# Patient Record
Sex: Female | Born: 1985 | Race: Black or African American | Hispanic: No | Marital: Single | Smoking: Current every day smoker
Health system: Southern US, Community
[De-identification: ages and names within clinical notes are randomized; demographics above are authoritative.]

## PROBLEM LIST (undated history)

## (undated) ENCOUNTER — Inpatient Hospital Stay (HOSPITAL_COMMUNITY): Payer: Self-pay

## (undated) DIAGNOSIS — L309 Dermatitis, unspecified: Secondary | ICD-10-CM

## (undated) DIAGNOSIS — K8020 Calculus of gallbladder without cholecystitis without obstruction: Secondary | ICD-10-CM

## (undated) DIAGNOSIS — I10 Essential (primary) hypertension: Secondary | ICD-10-CM

## (undated) DIAGNOSIS — D649 Anemia, unspecified: Secondary | ICD-10-CM

## (undated) DIAGNOSIS — R7303 Prediabetes: Secondary | ICD-10-CM

## (undated) DIAGNOSIS — G43909 Migraine, unspecified, not intractable, without status migrainosus: Secondary | ICD-10-CM

## (undated) DIAGNOSIS — J189 Pneumonia, unspecified organism: Secondary | ICD-10-CM

## (undated) HISTORY — DX: Anemia, unspecified: D64.9

---
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
## 2007-09-01 ENCOUNTER — Emergency Department (HOSPITAL_COMMUNITY): Admission: EM | Admit: 2007-09-01 | Discharge: 2007-09-01 | Payer: Self-pay | Admitting: Emergency Medicine

## 2008-04-10 ENCOUNTER — Inpatient Hospital Stay (HOSPITAL_COMMUNITY): Admission: AD | Admit: 2008-04-10 | Discharge: 2008-04-10 | Payer: Self-pay | Admitting: Family Medicine

## 2008-04-17 ENCOUNTER — Inpatient Hospital Stay (HOSPITAL_COMMUNITY): Admission: AD | Admit: 2008-04-17 | Discharge: 2008-04-18 | Payer: Self-pay | Admitting: Obstetrics & Gynecology

## 2009-04-24 ENCOUNTER — Ambulatory Visit: Payer: Self-pay | Admitting: Obstetrics and Gynecology

## 2009-04-24 ENCOUNTER — Inpatient Hospital Stay (HOSPITAL_COMMUNITY): Admission: AD | Admit: 2009-04-24 | Discharge: 2009-04-24 | Payer: Self-pay | Admitting: Obstetrics & Gynecology

## 2009-11-04 ENCOUNTER — Ambulatory Visit: Payer: Self-pay | Admitting: Obstetrics and Gynecology

## 2009-11-04 ENCOUNTER — Inpatient Hospital Stay (HOSPITAL_COMMUNITY): Admission: AD | Admit: 2009-11-04 | Discharge: 2009-11-04 | Payer: Self-pay | Admitting: Obstetrics & Gynecology

## 2009-11-29 ENCOUNTER — Emergency Department (HOSPITAL_COMMUNITY): Admission: EM | Admit: 2009-11-29 | Discharge: 2009-11-29 | Payer: Self-pay | Admitting: Emergency Medicine

## 2010-01-28 ENCOUNTER — Emergency Department (HOSPITAL_COMMUNITY): Admission: EM | Admit: 2010-01-28 | Discharge: 2009-07-21 | Payer: Self-pay | Admitting: Emergency Medicine

## 2010-02-13 ENCOUNTER — Emergency Department (HOSPITAL_COMMUNITY)
Admission: EM | Admit: 2010-02-13 | Discharge: 2010-02-13 | Payer: Self-pay | Source: Home / Self Care | Admitting: Emergency Medicine

## 2010-04-05 ENCOUNTER — Emergency Department (HOSPITAL_COMMUNITY)
Admission: EM | Admit: 2010-04-05 | Discharge: 2010-04-05 | Disposition: A | Discharge status: Home / Self Care | Payer: Self-pay | Attending: Emergency Medicine | Admitting: Emergency Medicine

## 2010-04-05 ENCOUNTER — Emergency Department (HOSPITAL_COMMUNITY): Payer: Self-pay

## 2010-04-05 DIAGNOSIS — R112 Nausea with vomiting, unspecified: Secondary | ICD-10-CM | POA: Insufficient documentation

## 2010-04-05 DIAGNOSIS — I10 Essential (primary) hypertension: Secondary | ICD-10-CM | POA: Insufficient documentation

## 2010-04-05 DIAGNOSIS — R059 Cough, unspecified: Secondary | ICD-10-CM | POA: Insufficient documentation

## 2010-04-05 DIAGNOSIS — R05 Cough: Secondary | ICD-10-CM | POA: Insufficient documentation

## 2010-04-05 LAB — DIFFERENTIAL
Eosinophils Absolute: 0.3 10*3/uL (ref 0.0–0.7)
Eosinophils Relative: 5 % (ref 0–5)
Lymphs Abs: 2.8 10*3/uL (ref 0.7–4.0)
Monocytes Relative: 9 % (ref 3–12)
Neutrophils Relative %: 36 % — ABNORMAL LOW (ref 43–77)

## 2010-04-05 LAB — URINALYSIS, ROUTINE W REFLEX MICROSCOPIC
Bilirubin Urine: NEGATIVE
Ketones, ur: NEGATIVE mg/dL
Nitrite: NEGATIVE
Protein, ur: NEGATIVE mg/dL
Urobilinogen, UA: 0.2 mg/dL (ref 0.0–1.0)
pH: 8 (ref 5.0–8.0)

## 2010-04-05 LAB — CBC
HCT: 37.1 % (ref 36.0–46.0)
MCH: 29.7 pg (ref 26.0–34.0)
MCV: 86.9 fL (ref 78.0–100.0)
Platelets: 339 10*3/uL (ref 150–400)
RBC: 4.27 MIL/uL (ref 3.87–5.11)

## 2010-04-05 LAB — COMPREHENSIVE METABOLIC PANEL
Alkaline Phosphatase: 63 U/L (ref 39–117)
BUN: 5 mg/dL — ABNORMAL LOW (ref 6–23)
CO2: 27 mEq/L (ref 19–32)
GFR calc non Af Amer: >60 mL/min (ref >60)
Glucose, Bld: 97 mg/dL (ref 70–99)
Potassium: 4 mEq/L (ref 3.5–5.1)
Total Bilirubin: 0.2 mg/dL — ABNORMAL LOW (ref 0.3–1.2)
Total Protein: 7.9 g/dL (ref 6.0–8.3)

## 2010-04-05 LAB — LIPASE, BLOOD: Lipase: 20 U/L (ref 11–59)

## 2010-05-03 LAB — POCT URINALYSIS DIPSTICK
Protein, ur: NEGATIVE mg/dL
Urobilinogen, UA: 0.2 mg/dL (ref 0.0–1.0)
pH: 6 (ref 5.0–8.0)

## 2010-05-03 LAB — WET PREP, GENITAL

## 2010-05-03 LAB — POCT PREGNANCY, URINE: Preg Test, Ur: NEGATIVE

## 2010-05-06 LAB — URINALYSIS, ROUTINE W REFLEX MICROSCOPIC
Bilirubin Urine: NEGATIVE
Ketones, ur: NEGATIVE mg/dL
Nitrite: NEGATIVE
Specific Gravity, Urine: 1.02 (ref 1.005–1.030)
Urobilinogen, UA: 0.2 mg/dL (ref 0.0–1.0)
pH: 6.5 (ref 5.0–8.0)

## 2010-05-06 LAB — POCT RAPID STREP A (OFFICE): Streptococcus, Group A Screen (Direct): POSITIVE — AB

## 2010-05-06 LAB — WET PREP, GENITAL
Trich, Wet Prep: NONE SEEN
Yeast Wet Prep HPF POC: NONE SEEN

## 2010-05-06 LAB — GC/CHLAMYDIA PROBE AMP, GENITAL
Chlamydia, DNA Probe: NEGATIVE
GC Probe Amp, Genital: NEGATIVE

## 2010-05-10 LAB — URINALYSIS, ROUTINE W REFLEX MICROSCOPIC
Bilirubin Urine: NEGATIVE
Nitrite: NEGATIVE
Specific Gravity, Urine: 1.02 (ref 1.005–1.030)
Urobilinogen, UA: 0.2 mg/dL (ref 0.0–1.0)
pH: 6 (ref 5.0–8.0)

## 2010-05-10 LAB — POCT I-STAT, CHEM 8
Chloride: 103 mEq/L (ref 96–112)
Glucose, Bld: 131 mg/dL — ABNORMAL HIGH (ref 70–99)
HCT: 38 % (ref 36.0–46.0)
Hemoglobin: 12.9 g/dL (ref 12.0–15.0)
Potassium: 3.8 mEq/L (ref 3.5–5.1)
Sodium: 138 mEq/L (ref 135–145)

## 2010-05-10 LAB — POCT PREGNANCY, URINE: Preg Test, Ur: NEGATIVE

## 2010-05-16 LAB — URINALYSIS, ROUTINE W REFLEX MICROSCOPIC
Leukocytes, UA: NEGATIVE
Nitrite: NEGATIVE
Protein, ur: NEGATIVE mg/dL
Specific Gravity, Urine: 1.02 (ref 1.005–1.030)
Urobilinogen, UA: 0.2 mg/dL (ref 0.0–1.0)

## 2010-05-16 LAB — URINE MICROSCOPIC-ADD ON

## 2010-05-16 LAB — GC/CHLAMYDIA PROBE AMP, GENITAL: Chlamydia, DNA Probe: POSITIVE — AB

## 2010-06-08 LAB — POCT PREGNANCY, URINE: Preg Test, Ur: NEGATIVE

## 2010-06-08 LAB — CBC
HCT: 34.9 % — ABNORMAL LOW (ref 36.0–46.0)
MCHC: 32.9 g/dL (ref 30.0–36.0)
MCV: 89.8 fL (ref 78.0–100.0)
Platelets: 350 10*3/uL (ref 150–400)
RDW: 13.2 % (ref 11.5–15.5)

## 2010-06-08 LAB — URINALYSIS, ROUTINE W REFLEX MICROSCOPIC
Bilirubin Urine: NEGATIVE
Nitrite: NEGATIVE
Specific Gravity, Urine: 1.025 (ref 1.005–1.030)
Urobilinogen, UA: 0.2 mg/dL (ref 0.0–1.0)
pH: 6 (ref 5.0–8.0)

## 2010-06-08 LAB — DIFFERENTIAL
Basophils Absolute: 0 10*3/uL (ref 0.0–0.1)
Basophils Relative: 0 % (ref 0–1)
Eosinophils Absolute: 0.4 10*3/uL (ref 0.0–0.7)
Eosinophils Relative: 6 % — ABNORMAL HIGH (ref 0–5)
Lymphocytes Relative: 45 % (ref 12–46)
Monocytes Absolute: 0.8 10*3/uL (ref 0.1–1.0)

## 2010-06-08 LAB — GC/CHLAMYDIA PROBE AMP, GENITAL
Chlamydia, DNA Probe: NEGATIVE
GC Probe Amp, Genital: NEGATIVE

## 2010-06-08 LAB — WET PREP, GENITAL: Yeast Wet Prep HPF POC: NONE SEEN

## 2010-09-04 ENCOUNTER — Emergency Department (HOSPITAL_COMMUNITY)
Admission: EM | Admit: 2010-09-04 | Discharge: 2010-09-04 | Disposition: A | Discharge status: Home / Self Care | Payer: Self-pay | Attending: Emergency Medicine | Admitting: Emergency Medicine

## 2010-09-04 ENCOUNTER — Emergency Department (HOSPITAL_COMMUNITY): Payer: Self-pay

## 2010-09-04 DIAGNOSIS — N6459 Other signs and symptoms in breast: Secondary | ICD-10-CM | POA: Insufficient documentation

## 2010-09-04 DIAGNOSIS — I10 Essential (primary) hypertension: Secondary | ICD-10-CM | POA: Insufficient documentation

## 2010-09-04 DIAGNOSIS — R51 Headache: Secondary | ICD-10-CM | POA: Insufficient documentation

## 2010-09-04 LAB — URINALYSIS, ROUTINE W REFLEX MICROSCOPIC
Bilirubin Urine: NEGATIVE
Hgb urine dipstick: NEGATIVE
Ketones, ur: NEGATIVE mg/dL
Nitrite: NEGATIVE
Protein, ur: NEGATIVE mg/dL
Urobilinogen, UA: 0.2 mg/dL (ref 0.0–1.0)

## 2010-09-04 LAB — POCT PREGNANCY, URINE: Preg Test, Ur: NEGATIVE

## 2010-09-28 ENCOUNTER — Emergency Department (HOSPITAL_COMMUNITY)
Admission: EM | Admit: 2010-09-28 | Discharge: 2010-09-28 | Disposition: A | Discharge status: Home / Self Care | Payer: Self-pay | Attending: Emergency Medicine | Admitting: Emergency Medicine

## 2010-09-28 DIAGNOSIS — L259 Unspecified contact dermatitis, unspecified cause: Secondary | ICD-10-CM | POA: Insufficient documentation

## 2010-09-28 DIAGNOSIS — L299 Pruritus, unspecified: Secondary | ICD-10-CM | POA: Insufficient documentation

## 2010-09-28 DIAGNOSIS — R21 Rash and other nonspecific skin eruption: Secondary | ICD-10-CM | POA: Insufficient documentation

## 2010-09-28 DIAGNOSIS — I10 Essential (primary) hypertension: Secondary | ICD-10-CM | POA: Insufficient documentation

## 2010-10-02 ENCOUNTER — Inpatient Hospital Stay (INDEPENDENT_AMBULATORY_CARE_PROVIDER_SITE_OTHER)
Admission: RE | Admit: 2010-10-02 | Discharge: 2010-10-02 | Disposition: A | Discharge status: Home / Self Care | Payer: Self-pay | Source: Ambulatory Visit | Attending: Family Medicine | Admitting: Family Medicine

## 2010-10-02 DIAGNOSIS — B356 Tinea cruris: Secondary | ICD-10-CM

## 2010-10-02 DIAGNOSIS — A499 Bacterial infection, unspecified: Secondary | ICD-10-CM

## 2010-10-02 DIAGNOSIS — N760 Acute vaginitis: Secondary | ICD-10-CM

## 2010-10-02 LAB — POCT URINALYSIS DIP (DEVICE)
Hgb urine dipstick: NEGATIVE
Leukocytes, UA: NEGATIVE
Nitrite: NEGATIVE
Protein, ur: NEGATIVE mg/dL
pH: 7 (ref 5.0–8.0)

## 2010-10-02 LAB — WET PREP, GENITAL: Trich, Wet Prep: NONE SEEN

## 2010-10-06 LAB — GC/CHLAMYDIA PROBE AMP, GENITAL
Chlamydia, DNA Probe: NEGATIVE
GC Probe Amp, Genital: NEGATIVE

## 2011-04-16 ENCOUNTER — Encounter (HOSPITAL_COMMUNITY): Payer: Self-pay | Admitting: *Deleted

## 2011-04-16 ENCOUNTER — Emergency Department (INDEPENDENT_AMBULATORY_CARE_PROVIDER_SITE_OTHER)
Admission: EM | Admit: 2011-04-16 | Discharge: 2011-04-16 | Disposition: A | Discharge status: Home / Self Care | Payer: Self-pay | Source: Home / Self Care

## 2011-04-16 DIAGNOSIS — J988 Other specified respiratory disorders: Secondary | ICD-10-CM

## 2011-04-16 DIAGNOSIS — B9789 Other viral agents as the cause of diseases classified elsewhere: Secondary | ICD-10-CM

## 2011-04-16 HISTORY — DX: Essential (primary) hypertension: I10

## 2011-04-16 LAB — POCT RAPID STREP A: Streptococcus, Group A Screen (Direct): NEGATIVE

## 2011-04-16 MED ORDER — BENZONATATE 100 MG PO CAPS: 100.0000 mg | ORAL_CAPSULE | Freq: Three times a day (TID) | ORAL | Status: AC | PRN

## 2011-04-16 MED ORDER — ONDANSETRON HCL 4 MG PO TABS: 4.0000 mg | ORAL_TABLET | Freq: Three times a day (TID) | ORAL | Status: AC | PRN

## 2011-04-16 MED ORDER — ALBUTEROL SULFATE HFA 108 (90 BASE) MCG/ACT IN AERS: 2.0000 | INHALATION_SPRAY | RESPIRATORY_TRACT | Status: DC | PRN

## 2011-04-16 NOTE — Discharge Instructions (Signed)
Viral Infections A viral infection can be caused by different types of viruses.Most viral infections are not serious and resolve on their own. However, some infections may cause severe symptoms and may lead to further complications. SYMPTOMS Viruses can frequently cause:  Minor sore throat.   Aches and pains.   Headaches.   Runny nose.   Different types of rashes.   Watery eyes.   Tiredness.   Cough.   Loss of appetite.   Gastrointestinal infections, resulting in nausea, vomiting, and diarrhea.  These symptoms do not respond to antibiotics because the infection is not caused by bacteria. However, you might catch a bacterial infection following the viral infection. This is sometimes called a "superinfection." Symptoms of such a bacterial infection may include:  Worsening sore throat with pus and difficulty swallowing.   Swollen neck glands.   Chills and a high or persistent fever.   Severe headache.   Tenderness over the sinuses.   Persistent overall ill feeling (malaise), muscle aches, and tiredness (fatigue).   Persistent cough.   Yellow, green, or brown mucus production with coughing.  HOME CARE INSTRUCTIONS   Only take over-the-counter or prescription medicines for pain, discomfort, diarrhea, or fever as directed by your caregiver.   Drink enough water and fluids to keep your urine clear or pale yellow. Sports drinks can provide valuable electrolytes, sugars, and hydration.   Get plenty of rest and maintain proper nutrition. Soups and broths with crackers or rice are fine.  SEEK IMMEDIATE MEDICAL CARE IF:   You have severe headaches, shortness of breath, chest pain, neck pain, or an unusual rash.   You have uncontrolled vomiting, diarrhea, or you are unable to keep down fluids.   You or your child has an oral temperature above 102 F (38.9 C), not controlled by medicine.   Your baby is older than 3 months with a rectal temperature of 102 F (38.9 C) or  higher.   Your baby is 77 months old or younger with a rectal temperature of 100.4 F (38 C) or higher.  MAKE SURE YOU:   Understand these instructions.   Will watch your condition.   Will get help right away if you are not doing well or get worse.  Document Released: 11/17/2004 Document Revised: 10/20/2010 Document Reviewed: 06/14/2010 [facility] Patient Information 2012 [place], [place].Antibiotic Nonuse  Your caregiver felt that the infection or problem was not one that would be helped with an antibiotic. Infections may be caused by viruses or bacteria. Only a caregiver can tell which one of these is the likely cause of an illness. A cold is the most common cause of infection in both adults and children. A cold is a virus. Antibiotic treatment will have no effect on a viral infection. Viruses can lead to many lost days of work caring for sick children and many missed days of school. Children may catch as many as 10 "colds" or "flus" per year during which they can be tearful, cranky, and uncomfortable. The goal of treating a virus is aimed at keeping the ill person comfortable. Antibiotics are medications used to help the body fight bacterial infections. There are relatively few types of bacteria that cause infections but there are hundreds of viruses. While both viruses and bacteria cause infection they are very different types of germs. A viral infection will typically go away by itself within 7 to 10 days. Bacterial infections may spread or get worse without antibiotic treatment. Examples of bacterial infections are:  Sore throats (  like strep throat or tonsillitis).   Infection in the lung (pneumonia).   Ear and skin infections.  Examples of viral infections are:  Colds or flus.   Most coughs and bronchitis.   Sore throats not caused by Strep.   Runny noses.  It is often best not to take an antibiotic when a viral infection is the cause of the problem. Antibiotics can kill off the  helpful bacteria that we have inside our body and allow harmful bacteria to start growing. Antibiotics can cause side effects such as allergies, nausea, and diarrhea without helping to improve the symptoms of the viral infection. Additionally, repeated uses of antibiotics can cause bacteria inside of our body to become resistant. That resistance can be passed onto harmful bacterial. The next time you have an infection it may be harder to treat if antibiotics are used when they are not needed. Not treating with antibiotics allows our own immune system to develop and take care of infections more efficiently. Also, antibiotics will work better for [place] when they are prescribed for bacterial infections. Treatments for a child that is ill may include:  Give extra fluids throughout the day to stay hydrated.   Get plenty of rest.   Only give your child over-the-counter or prescription medicines for pain, discomfort, or fever as directed by your caregiver.   The use of a cool mist humidifier may help stuffy noses.   Cold medications if suggested by your caregiver.  Your caregiver may decide to start you on an antibiotic if:  The problem you were seen for today continues for a longer length of time than expected.   You develop a secondary bacterial infection.  SEEK MEDICAL CARE IF:  Fever lasts longer than 5 days.   Symptoms continue to get worse after 5 to 7 days or become severe.   Difficulty in breathing develops.   Signs of dehydration develop (poor drinking, rare urinating, dark colored urine).   Changes in behavior or worsening tiredness (listlessness or lethargy).  Document Released: 04/18/2001 Document Revised: 10/20/2010 Document Reviewed: 10/15/2008 [facility] Patient Information 2012 [place], [place].  RESOURCE GUIDE  Dental Problems  Patients with Medicaid: [facility] [phone]                                            [id number] [employer] Phone:  [phone]                                                  Phone:  [phone]  If unable to pay or uninsured, contact:  [facility] or [facility]. to become qualified for the adult dental clinic.  Chronic Pain Problems Contact [facility]  [phone] Patients need to be referred by their primary care doctor.  Insufficient Money for Medicine Contact [employer] Way:  call "211" or [facility] (930)068-7124.  No Primary Care Doctor Call [facility]  [phone] Other agencies that provide inexpensive medical care    [facility]  [phone]    [facility]  [phone]  [facility]  [phone]    [facility]  [phone]    [facility]  [phone]    [facility]  [phone]  Psychological Services [facility] Behavioral Health  [phone] [facility]  [phone] [facility]   [phone] (emergency services [phone])  Substance Abuse Resources [facility]  [phone] [facility] [phone] [phone] Floydene Flock [phone] Residential & Outpatient Substance Abuse Program  [phone]  Abuse/Neglect [facility] Child Abuse Hotline (352)143-6128 [facility] Child Abuse Hotline (817)203-1127 (After Hours)  Emergency Shelter [facility] [phone]  M[facility] [place] [facility] [phone] [facility] [phone]  MRSA Hotline #:   424-445-9574    [facility] Resources  [facility] [place]     [employer]                          [facility] Dept. [address] [place]                       [address]      [address]  Blondell Reveal Phone:  [phone]                                   Phone:   [phone]                 Phone:  [phone]  [facility] Phone:  [phone]  [facility] Child Abuse Hotline 901-297-3568 [phone] (After Hours)

## 2011-04-16 NOTE — ED Provider Notes (Signed)
Medical screening examination/treatment/procedure(s) were performed by non-physician practitioner and as supervising physician I was immediately available for consultation/collaboration.  Leslee Home, M.D.   Roque Lias, MD 04/16/11 2051

## 2011-04-16 NOTE — ED Notes (Signed)
Pt with onset of sorethroat cough n&v Wednesday night - last vomited yesterday - no vomiting today denies nausea - remains with sore throat pain upper abd and back with coughing

## 2011-04-16 NOTE — ED Provider Notes (Signed)
History     CSN: [id number]  Arrival date & time 04/16/11  1325   None     Chief Complaint  Patient presents with  . Sore Throat  . Cough    (Consider location/radiation/quality/duration/timing/severity/associated sxs/prior treatment) HPI Comments: Patient reports she started with sore throat, cough, nasal congestion 4 days ago, the next day began to have N/V.  Today the sore throat is more severe.  Has vomited approximately 5 times, states she is not keeping anything down.  Denies fevers, abdominal pain, diarrhea, SOB.  Cough is nonproductive.  Pt took over the counter cold/flu preparations without improvement.  Patient is a 26 y.o. female presenting with pharyngitis and cough. The history is provided by the patient.  Sore Throat Pertinent negatives include no abdominal pain and no shortness of breath.  Cough Pertinent negatives include no ear pain, no shortness of breath and no wheezing.    Past Medical History  Diagnosis Date  . Hypertension     History reviewed. No pertinent past surgical history.  History reviewed. No pertinent family history.  History  Substance Use Topics  . Smoking status: Never Smoker   . Smokeless tobacco: Not on file  . Alcohol Use: Yes    OB History    Grav Para Term Preterm Abortions TAB SAB Ect Mult Living                  Review of Systems  Constitutional: Negative for fever.  HENT: Negative for ear pain and trouble swallowing.   Respiratory: Positive for cough. Negative for shortness of breath, wheezing and stridor.   Gastrointestinal: Negative for abdominal pain, diarrhea and constipation.  All other systems reviewed and are negative.    Allergies  Review of patient's allergies indicates no known allergies.  Home Medications   Current Outpatient Rx  Name Route Sig Dispense Refill  . THERAFLU COLD & COUGH PO Oral Take by mouth.    . NYQUIL PO Oral Take by mouth.      BP 140/96  Pulse 98  Temp(Src) 98.9 F (37.2 C)  (Oral)  Resp 20  SpO2 96%  LMP 03/25/2011  Physical Exam  Nursing note and vitals reviewed. Constitutional: She is oriented to person, place, and time. She appears well-developed and well-nourished.  HENT:  Head: Normocephalic and atraumatic.  Right Ear: External ear normal.  Left Ear: External ear normal.  Mouth/Throat: Uvula is midline. Mucous membranes are not dry. No uvula swelling. Oropharyngeal exudate, posterior oropharyngeal edema and posterior oropharyngeal erythema present. No tonsillar abscesses.  Neck: Trachea normal, normal range of motion and phonation normal. Neck supple. No tracheal tenderness present. No rigidity. No tracheal deviation and normal range of motion present.  Cardiovascular: Normal rate and regular rhythm.   Pulmonary/Chest: Effort normal and breath sounds normal. No stridor. No respiratory distress. She has no wheezes. She has no rales. She exhibits no tenderness.  Neurological: She is alert and oriented to person, place, and time.  Psychiatric: She has a normal mood and affect. Her behavior is normal.    ED Course  Procedures (including critical care time)   Labs Reviewed  POCT RAPID STREP A ([facility] URG CARE ONLY)   No results found.   1. Viral respiratory illness       MDM  Nontoxic afebrile patient with 4 days of cough/cold symptoms and N/V.  Pt is well hydrated clinically.  Lungs CTAB.  Strep negative (Pt is afebrile, +cough, no cervical lymphadenopathy, does have some tonsillar enlargement  and small amount of exudate, therefore by [facility] criteria I have tested patient but do not believe she required empiric treatment).  Pt d/c home with symptomatic medications.          Rise Patience, [place] 04/16/11 1539

## 2011-04-26 ENCOUNTER — Encounter (HOSPITAL_COMMUNITY): Payer: Self-pay | Admitting: Emergency Medicine

## 2011-04-26 ENCOUNTER — Emergency Department (HOSPITAL_COMMUNITY)
Admission: EM | Admit: 2011-04-26 | Discharge: 2011-04-27 | Disposition: A | Discharge status: Home Health | Payer: Self-pay | Attending: Emergency Medicine | Admitting: Emergency Medicine

## 2011-04-26 DIAGNOSIS — R05 Cough: Secondary | ICD-10-CM | POA: Insufficient documentation

## 2011-04-26 DIAGNOSIS — R059 Cough, unspecified: Secondary | ICD-10-CM | POA: Insufficient documentation

## 2011-04-26 DIAGNOSIS — J40 Bronchitis, not specified as acute or chronic: Secondary | ICD-10-CM | POA: Insufficient documentation

## 2011-04-26 DIAGNOSIS — R51 Headache: Secondary | ICD-10-CM | POA: Insufficient documentation

## 2011-04-26 DIAGNOSIS — R1110 Vomiting, unspecified: Secondary | ICD-10-CM | POA: Insufficient documentation

## 2011-04-26 DIAGNOSIS — J029 Acute pharyngitis, unspecified: Secondary | ICD-10-CM | POA: Insufficient documentation

## 2011-04-26 DIAGNOSIS — I10 Essential (primary) hypertension: Secondary | ICD-10-CM | POA: Insufficient documentation

## 2011-04-26 NOTE — ED Notes (Signed)
PT. REPORTS PERSISTENT DRY COUGH FOR 1 WEEK WITH HEADACHE AND SORE THROAT.

## 2011-04-27 ENCOUNTER — Emergency Department (HOSPITAL_COMMUNITY): Payer: Self-pay

## 2011-04-27 MED ORDER — PREDNISONE (PAK) 10 MG PO TABS: ORAL_TABLET | ORAL | Status: AC

## 2011-04-27 MED ORDER — HYDROCOD POLST-CHLORPHEN POLST 10-8 MG/5ML PO LQCR: 5.0000 mL | Freq: Two times a day (BID) | ORAL | Status: DC | PRN

## 2011-04-27 MED ORDER — PREDNISONE 20 MG PO TABS
60.0000 mg | ORAL_TABLET | Freq: Once | ORAL | Status: AC
  Administered 2011-04-27: 60 mg via ORAL
  Filled 2011-04-27: qty 3

## 2011-04-27 MED ORDER — ALBUTEROL SULFATE HFA 108 (90 BASE) MCG/ACT IN AERS
2.0000 | INHALATION_SPRAY | Freq: Once | RESPIRATORY_TRACT | Status: AC
  Administered 2011-04-27: 2 via RESPIRATORY_TRACT
  Filled 2011-04-27: qty 6.7

## 2011-04-27 NOTE — ED Notes (Signed)
Pt stated that she has had a cough x 1 1/2 weeks. Cough is nonproductive. Pt states that she begins to cough until it makes her vomit. Pt went to the Urgent Care and they gave her cough medicine (pt does not remember the name) and the medication is not working. Lung sounds are clear. Pt has not had any SOB  Or CP. Will continue to monitor.

## 2011-04-27 NOTE — ED Provider Notes (Signed)
Medical screening examination/treatment/procedure(s) were performed by non-physician practitioner and as supervising physician I was immediately available for consultation/collaboration.   Ayodeji Keimig L Naziah Portee, MD 04/27/11 0517 

## 2011-04-27 NOTE — Discharge Instructions (Signed)
Bronchitis Bronchitis is the body's way of reacting to injury and/or infection (inflammation) of the bronchi. Bronchi are the air tubes that extend from the windpipe into the lungs. If the inflammation becomes severe, it may cause shortness of breath. CAUSES  Inflammation may be caused by:  A virus.   Germs (bacteria).   Dust.   Allergens.   Pollutants and many other irritants.  The cells lining the bronchial tree are covered with tiny hairs (cilia). These constantly beat upward, away from the lungs, toward the mouth. This keeps the lungs free of pollutants. When these cells become too irritated and are unable to do their job, mucus begins to develop. This causes the characteristic cough of bronchitis. The cough clears the lungs when the cilia are unable to do their job. Without either of these protective mechanisms, the mucus would settle in the lungs. Then you would develop pneumonia. Smoking is a common cause of bronchitis and can contribute to pneumonia. Stopping this habit is the single most important thing you can do to help yourself. TREATMENT   Your caregiver may prescribe an antibiotic if the cough is caused by bacteria. Also, medicines that open up your airways make it easier to breathe. Your caregiver may also recommend or prescribe an expectorant. It will loosen the mucus to be coughed up. Only take over-the-counter or prescription medicines for pain, discomfort, or fever as directed by your caregiver.   Removing whatever causes the problem (smoking, for example) is critical to preventing the problem from getting worse.   Cough suppressants may be prescribed for relief of cough symptoms.   Inhaled medicines may be prescribed to help with symptoms now and to help prevent problems from returning.   For those with recurrent (chronic) bronchitis, there may be a need for steroid medicines.  SEEK IMMEDIATE MEDICAL CARE IF:   During treatment, you develop more pus-like mucus  (purulent sputum).   You have a fever.   Your baby is older than 3 months with a rectal temperature of 102 F (38.9 C) or higher.   Your baby is 3 months old or younger with a rectal temperature of 100.4 F (38 C) or higher.   You become progressively more ill.   You have increased difficulty breathing, wheezing, or shortness of breath.  It is necessary to seek immediate medical care if you are elderly or sick from any other disease. MAKE SURE YOU:   Understand these instructions.   Will watch your condition.   Will get help right away if you are not doing well or get worse.  Document Released: 02/07/2005 Document Revised: 01/27/2011 Document Reviewed: 12/18/2007 [employer] Patient Information 2012 [employer].  Antibiotic Nonuse  Your caregiver felt that the infection or problem was not one that would be helped with an antibiotic. Infections may be caused by viruses or bacteria. Only a caregiver can tell which one of these is the likely cause of an illness. A cold is the most common cause of infection in both adults and children. A cold is a virus. Antibiotic treatment will have no effect on a viral infection. Viruses can lead to many lost days of work caring for sick children and many missed days of school. Children may catch as many as 10 "colds" or "flus" per year during which they can be tearful, cranky, and uncomfortable. The goal of treating a virus is aimed at keeping the ill person comfortable. Antibiotics are medications used to help the body fight bacterial infections. There are   relatively few types of bacteria that cause infections but there are hundreds of viruses. While both viruses and bacteria cause infection they are very different types of germs. A viral infection will typically go away by itself within 7 to 10 days. Bacterial infections may spread or get worse without antibiotic treatment. Examples of bacterial infections are:  Sore throats (like strep throat or  tonsillitis).   Infection in the lung (pneumonia).   Ear and skin infections.  Examples of viral infections are:  Colds or flus.   Most coughs and bronchitis.   Sore throats not caused by Strep.   Runny noses.  It is often best not to take an antibiotic when a viral infection is the cause of the problem. Antibiotics can kill off the helpful bacteria that we have inside our body and allow harmful bacteria to start growing. Antibiotics can cause side effects such as allergies, nausea, and diarrhea without helping to improve the symptoms of the viral infection. Additionally, repeated uses of antibiotics can cause bacteria inside of our body to become resistant. That resistance can be passed onto harmful bacterial. The next time you have an infection it may be harder to treat if antibiotics are used when they are not needed. Not treating with antibiotics allows our own immune system to develop and take care of infections more efficiently. Also, antibiotics will work better for us when they are prescribed for bacterial infections. Treatments for a child that is ill may include:  Give extra fluids throughout the day to stay hydrated.   Get plenty of rest.   Only give your child over-the-counter or prescription medicines for pain, discomfort, or fever as directed by your caregiver.   The use of a cool mist humidifier may help stuffy noses.   Cold medications if suggested by your caregiver.  Your caregiver may decide to start you on an antibiotic if:  The problem you were seen for today continues for a longer length of time than expected.   You develop a secondary bacterial infection.  SEEK MEDICAL CARE IF:  Fever lasts longer than 5 days.   Symptoms continue to get worse after 5 to 7 days or become severe.   Difficulty in breathing develops.   Signs of dehydration develop (poor drinking, rare urinating, dark colored urine).   Changes in behavior or worsening tiredness (listlessness  or lethargy).  Document Released: 04/18/2001 Document Revised: 01/27/2011 Document Reviewed: 10/15/2008 [employer] Patient Information 2012 [employer]. 

## 2011-04-27 NOTE — ED Provider Notes (Signed)
History     CSN: [id number]  Arrival date & time 04/26/11  2246   First MD Initiated Contact with Patient 04/27/11 0042     1:00 AM HPI Patient she's been coughing for 2 weeks. States one week ago she was urgent care and prescribed a pill for coughing. States still has not helped with the symptoms. Reports she continues to cough despite symptomatic treatment at home states cough is now causing vomiting and sore throat. Reports emesis only after coughing and sore throat only with coughing. He also states she has had a mild headache due to coughing. Describes headache as a tension headache. Denies fever, chest pain, shortness of breath, back pain, nausea.  Patient is a 25 y.o. female presenting with cough. The history is provided by the patient.  Cough This is a new problem. Episode onset: 2 weeks. The problem occurs every few minutes. The problem has not changed since onset.The cough is non-productive. There has been no fever. Associated symptoms include headaches and sore throat. Pertinent negatives include no chest pain, no chills, no rhinorrhea, no myalgias, no shortness of breath and no wheezing. She has tried decongestants for the symptoms. The treatment provided no relief. She is a smoker.    Past Medical History  Diagnosis Date  . Hypertension     History reviewed. No pertinent past surgical history.  No family history on file.  History  Substance Use Topics  . Smoking status: Never Smoker   . Smokeless tobacco: Not on file  . Alcohol Use: Yes    OB History    Grav Para Term Preterm Abortions TAB SAB Ect Mult Living                  Review of Systems  Constitutional: Negative for fever and chills.  HENT: Positive for sore throat. Negative for congestion and rhinorrhea.   Respiratory: Positive for cough. Negative for shortness of breath and wheezing.   Cardiovascular: Negative for chest pain.  Gastrointestinal: Positive for vomiting. Negative for nausea and abdominal  pain.  Musculoskeletal: Negative for myalgias.  Neurological: Positive for headaches. Negative for dizziness and light-headedness.  All other systems reviewed and are negative.    Allergies  Review of patient's allergies indicates no known allergies.  Home Medications  No current outpatient prescriptions on file.  BP 140/96  Pulse 109  Temp(Src) 98 F (36.7 C) (Oral)  Resp 19  SpO2 96%  LMP 03/25/2011  Physical Exam  Vitals reviewed. Constitutional: She is oriented to person, place, and time. Vital signs are normal. She appears well-developed and well-nourished. No distress.  HENT:  Head: Normocephalic and atraumatic.  Eyes: Pupils are equal, round, and reactive to light.  Neck: Normal range of motion. Neck supple.  Cardiovascular: Normal rate, regular rhythm and normal heart sounds.  Exam reveals no gallop and no friction rub.   No murmur heard. Pulmonary/Chest: Effort normal. She has wheezes (LUL). She has no rales. She exhibits no tenderness.  Neurological: She is alert and oriented to person, place, and time.  Skin: Skin is warm and dry. No rash noted. No erythema. No pallor.  Psychiatric: She has a normal mood and affect. Her behavior is normal.    ED Course  Procedures   Labs Reviewed - No data to display Dg Chest 2 View  04/27/2011  *RADIOLOGY REPORT*  Clinical Data: Persistent cough and vomiting for 2 weeks  CHEST - 2 VIEW  Comparison: 04/05/2010  Findings: Normal heart size, mediastinal contours, and  pulmonary vascularity. Mild chronic bronchitic changes. Lungs otherwise clear. No pleural effusion or pneumothorax. No acute bony abnormalities.  IMPRESSION: Minimal chronic bronchitic changes. No acute abnormalities.  Original Report Authenticated By: Lollie Marrow, M.D.    MDM  Will treat patient for bronchitis with albuterol and prednisone. Discuss results with patient he agrees to plan and is ready for discharge.       Thomasene Lot, PA-C 04/27/11 [phone]

## 2011-07-16 ENCOUNTER — Encounter (HOSPITAL_COMMUNITY): Payer: Self-pay | Admitting: *Deleted

## 2011-07-16 ENCOUNTER — Emergency Department (HOSPITAL_COMMUNITY)
Admission: EM | Admit: 2011-07-16 | Discharge: 2011-07-16 | Disposition: A | Discharge status: Home / Self Care | Payer: Self-pay | Attending: Emergency Medicine | Admitting: Emergency Medicine

## 2011-07-16 DIAGNOSIS — F17200 Nicotine dependence, unspecified, uncomplicated: Secondary | ICD-10-CM | POA: Insufficient documentation

## 2011-07-16 DIAGNOSIS — I10 Essential (primary) hypertension: Secondary | ICD-10-CM | POA: Insufficient documentation

## 2011-07-16 DIAGNOSIS — R21 Rash and other nonspecific skin eruption: Secondary | ICD-10-CM | POA: Insufficient documentation

## 2011-07-16 DIAGNOSIS — L309 Dermatitis, unspecified: Secondary | ICD-10-CM

## 2011-07-16 HISTORY — DX: Dermatitis, unspecified: L30.9

## 2011-07-16 MED ORDER — HYDROXYZINE HCL 25 MG PO TABS: 25.0000 mg | ORAL_TABLET | Freq: Four times a day (QID) | ORAL | Status: AC

## 2011-07-16 MED ORDER — TRIAMCINOLONE ACETONIDE 0.1 % EX CREA: TOPICAL_CREAM | Freq: Two times a day (BID) | CUTANEOUS | Status: DC

## 2011-07-16 NOTE — ED Provider Notes (Signed)
Medical screening examination/treatment/procedure(s) were performed by non-physician practitioner and as supervising physician I was immediately available for consultation/collaboration.   Dayton Bailiff, MD 07/16/11 2146

## 2011-07-16 NOTE — ED Notes (Signed)
Pt from home with reports of a rash that itches, rash noted to arms, trunk and face. Pt reports that rash has worsened over the last month. Pt endorses hx of being treated by a Dermatologist for similar symptoms a couple years ago.

## 2011-07-16 NOTE — ED Provider Notes (Signed)
History     CSN: [id number]  Arrival date & time 07/16/11  1405   First MD Initiated Contact with Patient 07/16/11 1525      Chief Complaint  Patient presents with  . Rash  . Pruritis    (Consider location/radiation/quality/duration/timing/severity/associated sxs/prior treatment) HPI  26 year old female with history of eczema presents complaining of rash. Patient states for the past month she has had a rash to arms trunks and face. Rash is described as itchy, persistent and patchy.  She has had similar rash in the past that was treated by dermatologist.  She believes rash is likely related to change in weather and to using scented lotion.  She has discontinued her perfumed lotion and has switched over to DOVE body wash but has not notice any significant improvement.  Denies fever, chills, throat swelling, headache, n/v/d.  Has not tried anything else to help alleviate sxs.  Normal stress level.  No recent travel.  Past Medical History  Diagnosis Date  . Hypertension   . Eczema     History reviewed. No pertinent past surgical history.  History reviewed. No pertinent family history.  History  Substance Use Topics  . Smoking status: Current Everyday Smoker -- 0.5 packs/day    Types: Cigarettes  . Smokeless tobacco: Never Used  . Alcohol Use: Yes     occ    OB History    Grav Para Term Preterm Abortions TAB SAB Ect Mult Living                  Review of Systems  All other systems reviewed and are negative.    Allergies  Review of patient's allergies indicates no known allergies.  Home Medications  No current outpatient prescriptions on file.  BP 124/87  Pulse 79  Temp(Src) 99.2 F (37.3 C) (Oral)  Resp 18  Ht 5\' 5"  (1.651 m)  Wt 203 lb (92.08 kg)  BMI 33.78 kg/m2  SpO2 100%  LMP 07/10/2011  Physical Exam  Nursing note and vitals reviewed. Constitutional: She is oriented to person, place, and time. She appears well-developed and well-nourished. No  distress.  HENT:  Head: Normocephalic.  Mouth/Throat: Oropharynx is clear and moist.  Eyes: Conjunctivae are normal.  Musculoskeletal: She exhibits no edema and no tenderness.  Neurological: She is alert and oriented to person, place, and time.  Skin: Skin is warm. Rash noted. She is not diaphoretic.       Hyperpigmented, hyperkeratotic rash noted to extensor aspects of forearm bilaterally, and to face.  Lichenification patchy rash noted to dorsum of hands with excoriation marks.  Non vesicular, non petechial, non pustular.      ED Course  Procedures (including critical care time)  Labs Reviewed - No data to display No results found.   No diagnosis found.    MDM  Eczema rash noted.  No red flags, afebrile, VSS.  Will prescribe triamcinolone for rash on forearm, but to avoid face.  Referral to dermatologist given.  hydroxyzine for itch.          Fayrene Helper, PA-C 07/16/11 1541

## 2011-07-16 NOTE — Discharge Instructions (Signed)
Apply triamcinolone to rash twice daily as needed.  Avoid using on face.  Take vistaril for itch at night but do not take it while driving as it can cause drowsiness.  Follow up with dermatologist for further management.  Return to ER for evaluation if you notice signs of infection  Eczema Atopic dermatitis, or eczema, is an inherited type of sensitive skin. Often people with eczema have a family history of allergies, asthma, or hay fever. It causes a red itchy rash and dry scaly skin. The itchiness may occur before the skin rash and may be very intense. It is not contagious. Eczema is generally worse during the cooler winter months and often improves with the warmth of summer. Eczema usually starts showing signs in infancy. Some children outgrow eczema, but it may last through adulthood. Flare-ups may be caused by:  Eating something or contact with something you are sensitive or allergic to.   Stress.  DIAGNOSIS  The diagnosis of eczema is usually based upon symptoms and medical history. TREATMENT  Eczema cannot be cured, but symptoms usually can be controlled with treatment or avoidance of allergens (things to which you are sensitive or allergic to).  Controlling the itching and scratching.   Use over-the-counter antihistamines as directed for itching. It is especially useful at night when the itching tends to be worse.   Use over-the-counter steroid creams as directed for itching.   Scratching makes the rash and itching worse and may cause impetigo (a skin infection) if fingernails are contaminated (dirty).   Keeping the skin well moisturized with creams every day. This will seal in moisture and help prevent dryness. Lotions containing alcohol and water can dry the skin and are not recommended.   Limiting exposure to allergens.   Recognizing situations that cause stress.   Developing a plan to manage stress.  HOME CARE INSTRUCTIONS   Take prescription and over-the-counter medicines  as directed by your caregiver.   Do not use anything on the skin without checking with your caregiver.   Keep baths or showers short (5 minutes) in warm (not hot) water. Use mild cleansers for bathing. You may add non-perfumed bath oil to the bath water. It is best to avoid soap and bubble bath.   Immediately after a bath or shower, when the skin is still damp, apply a moisturizing ointment to the entire body. This ointment should be a petroleum ointment. This will seal in moisture and help prevent dryness. The thicker the ointment the better. These should be unscented.   Keep fingernails cut short and wash hands often. If your child has eczema, it may be necessary to put soft gloves or mittens on your child at night.   Dress in clothes made of cotton or cotton blends. Dress lightly, as heat increases itching.   Avoid foods that may cause flare-ups. Common foods include cow's milk, peanut butter, eggs and wheat.   Keep a child with eczema away from anyone with fever blisters. The virus that causes fever blisters (herpes simplex) can cause a serious skin infection in children with eczema.  SEEK MEDICAL CARE IF:   Itching interferes with sleep.   The rash gets worse or is not better within one week following treatment.   The rash looks infected (pus or soft yellow scabs).   You or your child has an oral temperature above 102 F (38.9 C).   Your baby is older than 3 months with a rectal temperature of 100.5 F (38.1 C) or  higher for more than 1 day.   The rash flares up after contact with someone who has fever blisters.  SEEK IMMEDIATE MEDICAL CARE IF:   Your baby is older than 3 months with a rectal temperature of 102 F (38.9 C) or higher.   Your baby is older than 3 months or younger with a rectal temperature of 100.4 F (38 C) or higher.  Document Released: 02/05/2000 Document Revised: 01/27/2011 Document Reviewed: 12/10/2008 [facility] Patient Information 2012 [place], [place].

## 2012-01-09 ENCOUNTER — Encounter (HOSPITAL_COMMUNITY): Payer: Self-pay | Admitting: Emergency Medicine

## 2012-01-09 ENCOUNTER — Emergency Department (HOSPITAL_COMMUNITY)
Admission: EM | Admit: 2012-01-09 | Discharge: 2012-01-09 | Disposition: A | Discharge status: Home / Self Care | Payer: Self-pay | Attending: Emergency Medicine | Admitting: Emergency Medicine

## 2012-01-09 DIAGNOSIS — J3489 Other specified disorders of nose and nasal sinuses: Secondary | ICD-10-CM | POA: Insufficient documentation

## 2012-01-09 DIAGNOSIS — Z8619 Personal history of other infectious and parasitic diseases: Secondary | ICD-10-CM | POA: Insufficient documentation

## 2012-01-09 DIAGNOSIS — M549 Dorsalgia, unspecified: Secondary | ICD-10-CM | POA: Insufficient documentation

## 2012-01-09 DIAGNOSIS — I10 Essential (primary) hypertension: Secondary | ICD-10-CM | POA: Insufficient documentation

## 2012-01-09 DIAGNOSIS — R51 Headache: Secondary | ICD-10-CM | POA: Insufficient documentation

## 2012-01-09 DIAGNOSIS — R6889 Other general symptoms and signs: Secondary | ICD-10-CM | POA: Insufficient documentation

## 2012-01-09 DIAGNOSIS — J069 Acute upper respiratory infection, unspecified: Secondary | ICD-10-CM | POA: Insufficient documentation

## 2012-01-09 DIAGNOSIS — F17200 Nicotine dependence, unspecified, uncomplicated: Secondary | ICD-10-CM | POA: Insufficient documentation

## 2012-01-09 MED ORDER — DIPHENHYDRAMINE HCL 25 MG PO CAPS
25.0000 mg | ORAL_CAPSULE | Freq: Once | ORAL | Status: AC
  Administered 2012-01-09: 25 mg via ORAL
  Filled 2012-01-09: qty 1

## 2012-01-09 MED ORDER — IBUPROFEN 800 MG PO TABS
800.0000 mg | ORAL_TABLET | Freq: Once | ORAL | Status: AC
  Administered 2012-01-09: 800 mg via ORAL
  Filled 2012-01-09: qty 1

## 2012-01-09 NOTE — ED Notes (Signed)
Pt c/o sore throat that hurts when she swallows and a pain that runs from her head to her upper back. Pt also c/o jaw pain. Pt stated that it started Saturday night. Denies cough and fever. Stated that her niece and sister has a cold.

## 2012-01-10 NOTE — ED Provider Notes (Signed)
Medical screening examination/treatment/procedure(s) were performed by non-physician practitioner and as supervising physician I was immediately available for consultation/collaboration.  Jones Skene, M.D.     Jones Skene, MD 01/10/12 1656

## 2012-01-10 NOTE — ED Provider Notes (Signed)
History     CSN: [id number]  Arrival date & time 01/09/12  9562   First MD Initiated Contact with Patient 01/09/12 0754      Chief Complaint  Patient presents with  . Sore Throat  . Headache  . Back Pain    (Consider location/radiation/quality/duration/timing/severity/associated sxs/prior treatment) Patient is a 26 y.o. female presenting with pharyngitis, headaches, and back pain. The history is provided by the patient. No language interpreter was used.  Sore Throat This is a new problem. The current episode started in the past 7 days. The problem occurs daily. The problem has been gradually worsening. Associated symptoms include congestion, headaches and a sore throat. Pertinent negatives include no abdominal pain, chest pain, chills, coughing, nausea, neck pain, numbness, rash, vertigo, vomiting or weakness. The symptoms are aggravated by coughing, smoking and swallowing. She has tried nothing for the symptoms.  Headache  Pertinent negatives include no nausea and no vomiting.  Back Pain  Associated symptoms include headaches. Pertinent negatives include no chest pain, no numbness, no abdominal pain and no weakness.  26yo female with c/o sore throat with upper respiratory symptoms and headache.  No fever or stiff neck.   Has taken nothing for the symptoms.  Swallowing makes it worse.  Family contacts sick.    Past Medical History  Diagnosis Date  . Hypertension   . Eczema     History reviewed. No pertinent past surgical history.  No family history on file.  History  Substance Use Topics  . Smoking status: Current Every Day Smoker -- 0.5 packs/day    Types: Cigarettes  . Smokeless tobacco: Never Used  . Alcohol Use: Yes     Comment: occ    OB History    Grav Para Term Preterm Abortions TAB SAB Ect Mult Living                  Review of Systems  Constitutional: Negative.  Negative for chills.  HENT: Positive for congestion, sore throat, rhinorrhea, sneezing and  postnasal drip. Negative for ear pain, trouble swallowing and neck pain.   Eyes: Negative.   Respiratory: Negative.  Negative for cough.   Cardiovascular: Negative.  Negative for chest pain.  Gastrointestinal: Negative.  Negative for nausea, vomiting and abdominal pain.  Musculoskeletal: Positive for back pain.  Skin: Negative for rash.  Neurological: Positive for headaches. Negative for vertigo, weakness and numbness.  Psychiatric/Behavioral: Negative.   All other systems reviewed and are negative.    Allergies  Review of patient's allergies indicates no known allergies.  Home Medications   Current Outpatient Rx  Name  Route  Sig  Dispense  Refill  . TRIAMCINOLONE ACETONIDE 0.1 % EX CREA   Topical   Apply 1 application topically 2 (two) times daily as needed. For eczema           BP 131/72  Pulse 99  Temp 99 F (37.2 C) (Oral)  Resp 20  SpO2 100%  LMP 12/25/2011  Physical Exam  Nursing note and vitals reviewed. Constitutional: She is oriented to person, place, and time. She appears well-developed and well-nourished.  HENT:  Head: Normocephalic and atraumatic.  Right Ear: Tympanic membrane normal. No tenderness.  Left Ear: Tympanic membrane normal. No tenderness.  Nose: Rhinorrhea present.  Mouth/Throat: Uvula is midline. Posterior oropharyngeal erythema present. No posterior oropharyngeal edema or tonsillar abscesses.  Eyes: Conjunctivae normal and EOM are normal. Pupils are equal, round, and reactive to light.  Neck: Normal range of motion. Neck  supple.  Cardiovascular: Normal rate.   Pulmonary/Chest: Effort normal and breath sounds normal. No respiratory distress.  Abdominal: Soft. Bowel sounds are normal.  Musculoskeletal: Normal range of motion. She exhibits no edema and no tenderness.  Neurological: She is alert and oriented to person, place, and time. She has normal reflexes.  Skin: Skin is warm and dry.  Psychiatric: She has a normal mood and affect.     ED Course  Procedures (including critical care time)   Labs Reviewed  RAPID STREP SCREEN  LAB REPORT - SCANNED   No results found.   1. Viral upper respiratory infection       MDM  Upper respiratory virus.  Strep negative        Remi Haggard, NP 01/10/12 1306

## 2012-09-18 ENCOUNTER — Encounter (HOSPITAL_COMMUNITY): Payer: Self-pay | Admitting: *Deleted

## 2012-09-18 ENCOUNTER — Inpatient Hospital Stay (HOSPITAL_COMMUNITY)
Admission: AD | Admit: 2012-09-18 | Discharge: 2012-09-18 | Disposition: A | Discharge status: Home / Self Care | Payer: BC Managed Care – PPO | Source: Ambulatory Visit | Attending: Obstetrics and Gynecology | Admitting: Obstetrics and Gynecology

## 2012-09-18 ENCOUNTER — Inpatient Hospital Stay (HOSPITAL_COMMUNITY): Payer: BC Managed Care – PPO

## 2012-09-18 DIAGNOSIS — O9989 Other specified diseases and conditions complicating pregnancy, childbirth and the puerperium: Secondary | ICD-10-CM

## 2012-09-18 DIAGNOSIS — A499 Bacterial infection, unspecified: Secondary | ICD-10-CM | POA: Insufficient documentation

## 2012-09-18 DIAGNOSIS — O2390 Unspecified genitourinary tract infection in pregnancy, unspecified trimester: Secondary | ICD-10-CM | POA: Insufficient documentation

## 2012-09-18 DIAGNOSIS — O26899 Other specified pregnancy related conditions, unspecified trimester: Secondary | ICD-10-CM

## 2012-09-18 DIAGNOSIS — N760 Acute vaginitis: Secondary | ICD-10-CM | POA: Insufficient documentation

## 2012-09-18 DIAGNOSIS — R109 Unspecified abdominal pain: Secondary | ICD-10-CM | POA: Insufficient documentation

## 2012-09-18 DIAGNOSIS — B9689 Other specified bacterial agents as the cause of diseases classified elsewhere: Secondary | ICD-10-CM | POA: Insufficient documentation

## 2012-09-18 DIAGNOSIS — O368390 Maternal care for abnormalities of the fetal heart rate or rhythm, unspecified trimester, not applicable or unspecified: Secondary | ICD-10-CM | POA: Insufficient documentation

## 2012-09-18 LAB — URINALYSIS, ROUTINE W REFLEX MICROSCOPIC
Glucose, UA: NEGATIVE mg/dL
Hgb urine dipstick: NEGATIVE
Ketones, ur: NEGATIVE mg/dL
Protein, ur: NEGATIVE mg/dL
Urobilinogen, UA: 0.2 mg/dL (ref 0.0–1.0)

## 2012-09-18 LAB — OB RESULTS CONSOLE GC/CHLAMYDIA
CHLAMYDIA, DNA PROBE: NEGATIVE
GC PROBE AMP, GENITAL: NEGATIVE

## 2012-09-18 LAB — WET PREP, GENITAL
Trich, Wet Prep: NONE SEEN
Yeast Wet Prep HPF POC: NONE SEEN

## 2012-09-18 LAB — CBC
HCT: 36.6 % (ref 36.0–46.0)
MCH: 29.8 pg (ref 26.0–34.0)
MCHC: 34.2 g/dL (ref 30.0–36.0)
RDW: 12.4 % (ref 11.5–15.5)

## 2012-09-18 LAB — ABO/RH: ABO/RH(D): O POS

## 2012-09-18 LAB — HCG, QUANTITATIVE, PREGNANCY: hCG, Beta Chain, Quant, S: 9404 m[IU]/mL — ABNORMAL HIGH (ref <5)

## 2012-09-18 MED ORDER — METRONIDAZOLE 500 MG PO TABS: 500.0000 mg | ORAL_TABLET | Freq: Two times a day (BID) | ORAL | Status: DC

## 2012-09-18 NOTE — MAU Note (Signed)
Patient states she started having abdominal pain and vomiting that started last night.

## 2012-09-18 NOTE — MAU Provider Note (Signed)
History     CSN: [id number]  Arrival date and time: 09/18/12 4098   First Provider Initiated Contact with Patient 09/18/12 [phone]      Chief Complaint  Patient presents with  . Abdominal Pain  . Emesis   HPI [name] is a 27 y.o. G1P0 at [redacted]w[redacted]d who presents to MAU today with complaint of lower abdominal pain since yesterday. She also had N/V last night, none today. She denies fever, vaginal bleeding, discharge, UTI symptoms, diarrhea or constipation. Pain comes and goes. Rated at 5/10 now and 8/10 at the worst. LMP was 08/19/12.    OB History   Grav Para Term Preterm Abortions TAB SAB Ect Mult Living   1               Past Medical History  Diagnosis Date  . Hypertension   . Eczema   . Chlamydia     History reviewed. No pertinent past surgical history.  History reviewed. No pertinent family history.  History  Substance Use Topics  . Smoking status: Current Every Day Smoker -- 0.50 packs/day    Types: Cigarettes  . Smokeless tobacco: Never Used  . Alcohol Use: Yes     Comment: occ    Allergies: No Known Allergies  No prescriptions prior to admission    Review of Systems  Constitutional: Negative for fever and malaise/fatigue.  Gastrointestinal: Positive for nausea, vomiting and abdominal pain. Negative for diarrhea and constipation.  Genitourinary: Negative for dysuria, urgency and frequency.       Neg - vaginal bleeding, discharge   Physical Exam   Blood pressure 129/80, pulse 90, temperature 97.5 F (36.4 C), temperature source Oral, resp. rate 18, height 5\' 6"  (1.676 m), weight 207 lb 3.2 oz (93.985 kg), last menstrual period 08/19/2012, SpO2 100.00%.  Physical Exam  Constitutional: She is oriented to person, place, and time. She appears well-developed and well-nourished. No distress.  HENT:  Head: Normocephalic and atraumatic.  Cardiovascular: Normal rate, regular rhythm and normal heart sounds.   Respiratory: Effort normal and breath sounds  normal. No respiratory distress.  GI: Soft. Bowel sounds are normal. She exhibits no distension and no mass. There is no tenderness. There is no rebound and no guarding.  Genitourinary: Uterus is not enlarged (exam limited by maternal body habitus) and not tender. Cervix exhibits no motion tenderness, no discharge and no friability. Right adnexum displays no mass and no tenderness. Left adnexum displays no mass and no tenderness. There is bleeding (scant bleeding from cervix after probe obtained) around the vagina. Vaginal discharge (scant thin, white discharge noted) found.  Neurological: She is alert and oriented to person, place, and time.  Skin: Skin is warm and dry. No erythema.  Psychiatric: She has a normal mood and affect.   Results for orders placed during the hospital encounter of 09/18/12 (from the past 24 hour(s))  URINALYSIS, ROUTINE W REFLEX MICROSCOPIC     Status: Abnormal   Collection Time    09/18/12  8:23 AM      Result Value Range   Color, Urine YELLOW  YELLOW   APPearance HAZY (*) CLEAR   Specific Gravity, Urine 1.015  1.005 - 1.030   pH 6.0  5.0 - 8.0   Glucose, UA NEGATIVE  NEGATIVE mg/dL   Hgb urine dipstick NEGATIVE  NEGATIVE   Bilirubin Urine NEGATIVE  NEGATIVE   Ketones, ur NEGATIVE  NEGATIVE mg/dL   Protein, ur NEGATIVE  NEGATIVE mg/dL   Urobilinogen, UA 0.2  0.0 - 1.0 mg/dL   Nitrite NEGATIVE  NEGATIVE   Leukocytes, UA SMALL (*) NEGATIVE  URINE MICROSCOPIC-ADD ON     Status: Abnormal   Collection Time    09/18/12  8:23 AM      Result Value Range   Squamous Epithelial / LPF FEW (*) RARE   WBC, UA 3-6  <3 WBC/hpf   Bacteria, UA RARE  RARE  POCT PREGNANCY, URINE     Status: Abnormal   Collection Time    09/18/12  8:24 AM      Result Value Range   Preg Test, Ur POSITIVE (*) NEGATIVE  WET PREP, GENITAL     Status: Abnormal   Collection Time    09/18/12  8:37 AM      Result Value Range   Yeast Wet Prep HPF POC NONE SEEN  NONE SEEN   Trich, Wet Prep NONE  SEEN  NONE SEEN   Clue Cells Wet Prep HPF POC FEW (*) NONE SEEN   WBC, Wet Prep HPF POC FEW (*) NONE SEEN  CBC     Status: None   Collection Time    09/18/12  8:55 AM      Result Value Range   WBC 6.4  4.0 - 10.5 K/uL   RBC 4.19  3.87 - 5.11 MIL/uL   Hemoglobin 12.5  12.0 - 15.0 g/dL   HCT 82.9  56.2 - 13.0 %   MCV 87.4  78.0 - 100.0 fL   MCH 29.8  26.0 - 34.0 pg   MCHC 34.2  30.0 - 36.0 g/dL   RDW 86.5  78.4 - 69.6 %   Platelets 298  150 - 400 K/uL  ABO/RH     Status: None   Collection Time    09/18/12  8:55 AM      Result Value Range   ABO/RH(D) O POS    HCG, QUANTITATIVE, PREGNANCY     Status: Abnormal   Collection Time    09/18/12  8:55 AM      Result Value Range   hCG, Beta Chain, Quant, S 9404 (*) <5 mIU/mL    MAU Course  Procedures None  MDM UPT - positive UA, Wet prep, GC/Chlamydia, CBC, ABO/Rh, quant hCG and [place] today  Assessment and Plan  A: Abdominal pain in early pregnancy IUP with fetal bradycardia at [redacted]w[redacted]d  Bacterial vaginosis  P: Discharge Rx for Flagyl sent to patient's pharmacy Follow-up US scheduled on 09/27/12 for viability First trimester warning signs reviewed Patient may return to MAU sooner as needed or if her condition were to change or worsen  Freddi Starr, PA-C  09/18/2012, 2:35 PM

## 2012-09-19 LAB — GC/CHLAMYDIA PROBE AMP
CT Probe RNA: NEGATIVE
GC Probe RNA: NEGATIVE

## 2012-09-24 NOTE — MAU Provider Note (Signed)
Attestation of Attending Supervision of Advanced Practitioner (CNM/NP): Evaluation and management procedures were performed by the Advanced Practitioner under my supervision and collaboration.  I have reviewed the Advanced Practitioner's note and chart, and I agree with the management and plan.  Kashlynn Kundert 09/24/2012 12:20 PM

## 2012-09-27 ENCOUNTER — Encounter (HOSPITAL_COMMUNITY): Payer: Self-pay | Admitting: *Deleted

## 2012-09-27 ENCOUNTER — Inpatient Hospital Stay (HOSPITAL_COMMUNITY)
Admission: AD | Admit: 2012-09-27 | Discharge: 2012-09-27 | Disposition: A | Discharge status: Home / Self Care | Payer: BC Managed Care – PPO | Source: Ambulatory Visit | Attending: Obstetrics & Gynecology | Admitting: Obstetrics & Gynecology

## 2012-09-27 ENCOUNTER — Ambulatory Visit (HOSPITAL_COMMUNITY)
Admit: 2012-09-27 | Discharge: 2012-09-27 | Disposition: A | Discharge status: Home / Self Care | Payer: BC Managed Care – PPO | Attending: Medical | Admitting: Medical

## 2012-09-27 DIAGNOSIS — O3680X0 Pregnancy with inconclusive fetal viability, not applicable or unspecified: Secondary | ICD-10-CM | POA: Insufficient documentation

## 2012-09-27 DIAGNOSIS — Z3689 Encounter for other specified antenatal screening: Secondary | ICD-10-CM | POA: Insufficient documentation

## 2012-09-27 DIAGNOSIS — O209 Hemorrhage in early pregnancy, unspecified: Secondary | ICD-10-CM | POA: Insufficient documentation

## 2012-09-27 DIAGNOSIS — O3410 Maternal care for benign tumor of corpus uteri, unspecified trimester: Secondary | ICD-10-CM | POA: Insufficient documentation

## 2012-09-27 DIAGNOSIS — O99891 Other specified diseases and conditions complicating pregnancy: Secondary | ICD-10-CM | POA: Insufficient documentation

## 2012-09-27 DIAGNOSIS — O9989 Other specified diseases and conditions complicating pregnancy, childbirth and the puerperium: Secondary | ICD-10-CM

## 2012-09-27 DIAGNOSIS — O26899 Other specified pregnancy related conditions, unspecified trimester: Secondary | ICD-10-CM

## 2012-09-27 DIAGNOSIS — R109 Unspecified abdominal pain: Secondary | ICD-10-CM

## 2012-09-27 DIAGNOSIS — D259 Leiomyoma of uterus, unspecified: Secondary | ICD-10-CM | POA: Insufficient documentation

## 2012-09-27 DIAGNOSIS — J069 Acute upper respiratory infection, unspecified: Secondary | ICD-10-CM | POA: Insufficient documentation

## 2012-09-27 NOTE — MAU Provider Note (Signed)
[name] is a 27 y.o. G1P0 at [redacted]w[redacted]d who presents to MAU for follow-up US results. Patient denies issues today.   LMP 08/19/2012 GENERAL: Well-developed, well-nourished female in no acute distress.  HEENT: Normocephalic, atraumatic.   LUNGS: Effort normal HEART: Regular rate  SKIN: Warm, dry and without erythema PSYCH: Normal mood and affect    A: IUP at [redacted]w[redacted]d Small subchorionic hemorrhage Uterine fibroid URI  P: Discharge home Patient given list of safe OTC medications in pregnancy Patient has Medicaid pending and plans to go to [facility] for prenatal care Patient may return to MAU as needed  Freddi Starr, PA-C 09/27/2012 1:08 PM

## 2012-10-04 LAB — OB RESULTS CONSOLE ABO/RH: RH TYPE: POSITIVE

## 2012-10-04 LAB — OB RESULTS CONSOLE RUBELLA ANTIBODY, IGM: Rubella: IMMUNE

## 2012-10-04 LAB — OB RESULTS CONSOLE ANTIBODY SCREEN: Antibody Screen: NEGATIVE

## 2012-10-04 LAB — OB RESULTS CONSOLE HIV ANTIBODY (ROUTINE TESTING): HIV: NONREACTIVE

## 2012-10-04 LAB — OB RESULTS CONSOLE RPR: RPR: NONREACTIVE

## 2012-10-04 LAB — OB RESULTS CONSOLE HEPATITIS B SURFACE ANTIGEN: Hepatitis B Surface Ag: NEGATIVE

## 2013-02-14 ENCOUNTER — Encounter (HOSPITAL_COMMUNITY): Payer: Self-pay | Admitting: *Deleted

## 2013-02-14 ENCOUNTER — Inpatient Hospital Stay (HOSPITAL_COMMUNITY)
Admission: AD | Admit: 2013-02-14 | Discharge: 2013-02-14 | Disposition: A | Discharge status: Home / Self Care | Payer: Medicaid Other | Source: Ambulatory Visit | Attending: Obstetrics and Gynecology | Admitting: Obstetrics and Gynecology

## 2013-02-14 DIAGNOSIS — O99891 Other specified diseases and conditions complicating pregnancy: Secondary | ICD-10-CM | POA: Insufficient documentation

## 2013-02-14 DIAGNOSIS — Y92009 Unspecified place in unspecified non-institutional (private) residence as the place of occurrence of the external cause: Secondary | ICD-10-CM | POA: Diagnosis not present

## 2013-02-14 DIAGNOSIS — O99330 Smoking (tobacco) complicating pregnancy, unspecified trimester: Secondary | ICD-10-CM | POA: Insufficient documentation

## 2013-02-14 DIAGNOSIS — W108XXA Fall (on) (from) other stairs and steps, initial encounter: Secondary | ICD-10-CM | POA: Diagnosis not present

## 2013-02-14 DIAGNOSIS — M549 Dorsalgia, unspecified: Secondary | ICD-10-CM | POA: Diagnosis not present

## 2013-02-14 MED ORDER — ACETAMINOPHEN-CODEINE #3 300-30 MG PO TABS
1.0000 | ORAL_TABLET | Freq: Once | ORAL | Status: DC
Start: 2013-02-14 — End: 2013-02-14

## 2013-02-14 MED ORDER — CYCLOBENZAPRINE HCL 10 MG PO TABS
10.0000 mg | ORAL_TABLET | Freq: Once | ORAL | Status: AC
  Administered 2013-02-14: 10 mg via ORAL
  Filled 2013-02-14: qty 1

## 2013-02-14 NOTE — MAU Provider Note (Signed)
  History     CSN: [id number]  Arrival date and time: 02/14/13 1559   None     Chief Complaint  Patient presents with  . Fall   HPI  Pt is a G1P0 at [redacted]w[redacted]d weeks IUP with report of falling down while going down steps and landed on left side of back.  Pt reports abdomen did not strike anything.  Reports having throbbing pain on left side of back rated a 6/10.  Heat applied to back has eased pain. Pt denies vaginal bleeding, leaking of fluid, or contractions.    Past Medical History  Diagnosis Date  . Hypertension   . Eczema   . Chlamydia     Past Surgical History  Procedure Laterality Date  . No past surgeries      Family History  Problem Relation Age of Onset  . Hypertension Maternal Aunt   . Hypertension Maternal Uncle     History  Substance Use Topics  . Smoking status: Current Every Day Smoker -- 0.50 packs/day    Types: Cigarettes  . Smokeless tobacco: Never Used  . Alcohol Use: Yes     Comment: occ    Allergies: No Known Allergies  Prescriptions prior to admission  Medication Sig Dispense Refill  . Prenatal Vit-Fe Fumarate-FA (PRENATAL MULTIVITAMIN) TABS tablet Take 1 tablet by mouth daily at 12 noon.        Review of Systems  Musculoskeletal: Positive for back pain.  All other systems reviewed and are negative.   Physical Exam   Blood pressure 126/71, pulse 114, temperature 98.5 F (36.9 C), temperature source Oral, resp. rate 20, height 5\' 5"  (1.651 m), weight 101.606 kg (224 lb), last menstrual period 08/19/2012.  Physical Exam  Constitutional: She is oriented to person, place, and time. She appears well-developed and well-nourished. No distress.  HENT:  Head: Normocephalic.  Neck: Normal range of motion. Neck supple.  Cardiovascular: Normal rate, regular rhythm and normal heart sounds.   Respiratory: Effort normal and breath sounds normal.  GI: Soft. There is no tenderness.  Genitourinary: No bleeding around the vagina.  Musculoskeletal:  Normal range of motion. She exhibits edema.  Ambulates without difficulty  Neurological: She is alert and oriented to person, place, and time.  Skin: Skin is warm and dry.  No bruising noted on skin    [address] on unit; assumes care of patient [facility] 02/14/2013, 5:10 PM   MAU Course  Procedures   Assessment and Plan   S: pt appears in no distress, states she is hungry and wants to go home, denies any pain or ctx at this time. Pt declines [place]   O: VSS, FHR cat, toco quiet  A: IUP at 30wks, s/p mild fall on step, no apparent injury or s/s of abruption,  Will d/c home w routine f/u, enc to call if having pain, ctx, or bleeding.   S.Rosalba Totty, CNM

## 2013-02-14 NOTE — MAU Note (Signed)
Fell 15 minutes ago on the last 3 wooden steps and landed on her lower back;

## 2013-02-21 NOTE — L&D Delivery Note (Signed)
Delivery Note  At 10:18 PM a viable female was delivered via C-Section, Low Transverse (Presentation OP: ;  ).  APGAR: 9, 9; weight 8 lb 10 oz (3912 g).   Placenta status: Intact, Manual removal.  Cord: 3 vessels.  For further information please see the operative report.  Following delivery pt was noted to be tachycardic with HR range of 140-160 with elevated BP.  O2 sat remain stable.  UOP adequate with normal specific gravity.  EKG showed sinus tachycardia, CBC stable, uterus firm and CXR was unremarkable.  Pt was given IV Esmolol with improvement to the 120s.  Pt was noted to be febrile to 101 despite removing the warming blankets.  Will start on Unasyn for chorioamnionitis.  Pt will also receive Lovenox for prophylaxis 12hr from epidural removal.  Repeat PIH labs will also be performed as pt has a remote h/o HTN untreated.  Dr. Jillyn Hidden at bedside for above management.  Pt to AICU for further management.  Anesthesia: Epidural  Est. Blood Loss (mL): 600cc  Mom to AICU.  Baby to Nursery.  Janyth Pupa, M 05/26/2013, 12:02 AM

## 2013-03-13 DIAGNOSIS — Z7185 Encounter for immunization safety counseling: Secondary | ICD-10-CM | POA: Insufficient documentation

## 2013-04-24 LAB — OB RESULTS CONSOLE GBS: GBS: NEGATIVE

## 2013-05-22 ENCOUNTER — Encounter (HOSPITAL_COMMUNITY): Payer: Self-pay | Admitting: *Deleted

## 2013-05-22 ENCOUNTER — Telehealth (HOSPITAL_COMMUNITY): Payer: Self-pay | Admitting: *Deleted

## 2013-05-22 NOTE — Telephone Encounter (Signed)
Preadmission screen  

## 2013-05-24 ENCOUNTER — Inpatient Hospital Stay (HOSPITAL_COMMUNITY)
Admission: RE | Admit: 2013-05-24 | Discharge: 2013-05-28 | DRG: 765 | Disposition: A | Discharge status: Home / Self Care | Payer: Medicaid Other | Source: Ambulatory Visit | Attending: Obstetrics and Gynecology | Admitting: Obstetrics and Gynecology

## 2013-05-24 ENCOUNTER — Encounter (HOSPITAL_COMMUNITY): Payer: Self-pay

## 2013-05-24 VITALS — BP 135/85 | HR 106 | Temp 98.1°F | Resp 18 | Ht 65.0 in | Wt 227.3 lb

## 2013-05-24 DIAGNOSIS — O99334 Smoking (tobacco) complicating childbirth: Secondary | ICD-10-CM | POA: Diagnosis present

## 2013-05-24 DIAGNOSIS — O9902 Anemia complicating childbirth: Secondary | ICD-10-CM | POA: Diagnosis present

## 2013-05-24 DIAGNOSIS — O99214 Obesity complicating childbirth: Secondary | ICD-10-CM

## 2013-05-24 DIAGNOSIS — E669 Obesity, unspecified: Secondary | ICD-10-CM | POA: Diagnosis present

## 2013-05-24 DIAGNOSIS — O34599 Maternal care for other abnormalities of gravid uterus, unspecified trimester: Secondary | ICD-10-CM | POA: Diagnosis present

## 2013-05-24 DIAGNOSIS — O480 Post-term pregnancy: Principal | ICD-10-CM | POA: Diagnosis present

## 2013-05-24 DIAGNOSIS — O411090 Infection of amniotic sac and membranes, unspecified, unspecified trimester, not applicable or unspecified: Secondary | ICD-10-CM | POA: Diagnosis present

## 2013-05-24 DIAGNOSIS — D4959 Neoplasm of unspecified behavior of other genitourinary organ: Secondary | ICD-10-CM | POA: Diagnosis present

## 2013-05-24 DIAGNOSIS — D259 Leiomyoma of uterus, unspecified: Secondary | ICD-10-CM | POA: Diagnosis not present

## 2013-05-24 DIAGNOSIS — O4290 Premature rupture of membranes, unspecified as to length of time between rupture and onset of labor, unspecified weeks of gestation: Secondary | ICD-10-CM | POA: Diagnosis present

## 2013-05-24 DIAGNOSIS — O3410 Maternal care for benign tumor of corpus uteri, unspecified trimester: Secondary | ICD-10-CM

## 2013-05-24 DIAGNOSIS — D649 Anemia, unspecified: Secondary | ICD-10-CM | POA: Diagnosis present

## 2013-05-24 DIAGNOSIS — Z8679 Personal history of other diseases of the circulatory system: Secondary | ICD-10-CM

## 2013-05-24 DIAGNOSIS — Z98891 History of uterine scar from previous surgery: Secondary | ICD-10-CM

## 2013-05-24 DIAGNOSIS — F17200 Nicotine dependence, unspecified, uncomplicated: Secondary | ICD-10-CM | POA: Diagnosis present

## 2013-05-24 DIAGNOSIS — O1002 Pre-existing essential hypertension complicating childbirth: Secondary | ICD-10-CM | POA: Diagnosis present

## 2013-05-24 DIAGNOSIS — Z6837 Body mass index (BMI) 37.0-37.9, adult: Secondary | ICD-10-CM

## 2013-05-24 LAB — CBC
HEMATOCRIT: 33.2 % — AB (ref 36.0–46.0)
Hemoglobin: 11.4 g/dL — ABNORMAL LOW (ref 12.0–15.0)
MCH: 30.1 pg (ref 26.0–34.0)
MCHC: 34.3 g/dL (ref 30.0–36.0)
MCV: 87.6 fL (ref 78.0–100.0)
PLATELETS: 292 10*3/uL (ref 150–400)
RBC: 3.79 MIL/uL — ABNORMAL LOW (ref 3.87–5.11)
RDW: 13.1 % (ref 11.5–15.5)
WBC: 10.5 10*3/uL (ref 4.0–10.5)

## 2013-05-24 LAB — TYPE AND SCREEN
ABO/RH(D): O POS
Antibody Screen: NEGATIVE

## 2013-05-24 MED ORDER — CITRIC ACID-SODIUM CITRATE 334-500 MG/5ML PO SOLN
30.0000 mL | ORAL | Status: DC | PRN
  Administered 2013-05-25: 30 mL via ORAL
  Filled 2013-05-24: qty 15

## 2013-05-24 MED ORDER — LACTATED RINGERS IV SOLN
INTRAVENOUS | Status: DC
  Administered 2013-05-24 – 2013-05-25 (×5): via INTRAVENOUS

## 2013-05-24 MED ORDER — ONDANSETRON HCL 4 MG/2ML IJ SOLN: 4.0000 mg | Freq: Four times a day (QID) | INTRAMUSCULAR | Status: DC | PRN

## 2013-05-24 MED ORDER — LIDOCAINE HCL (PF) 1 % IJ SOLN: 30.0000 mL | INTRAMUSCULAR | Status: DC | PRN

## 2013-05-24 MED ORDER — LACTATED RINGERS IV SOLN
500.0000 mL | INTRAVENOUS | Status: DC | PRN
  Administered 2013-05-24 – 2013-05-25 (×2): 500 mL via INTRAVENOUS

## 2013-05-24 MED ORDER — ACETAMINOPHEN 325 MG PO TABS
650.0000 mg | ORAL_TABLET | ORAL | Status: DC | PRN
  Filled 2013-05-24: qty 2

## 2013-05-24 MED ORDER — OXYTOCIN BOLUS FROM INFUSION: 500.0000 mL | INTRAVENOUS | Status: DC

## 2013-05-24 MED ORDER — OXYTOCIN 40 UNITS IN LACTATED RINGERS INFUSION - SIMPLE MED
62.5000 mL/h | INTRAVENOUS | Status: DC
  Filled 2013-05-24: qty 1000

## 2013-05-24 MED ORDER — BUTORPHANOL TARTRATE 1 MG/ML IJ SOLN
1.0000 mg | INTRAMUSCULAR | Status: DC | PRN
  Administered 2013-05-25 (×2): 1 mg via INTRAVENOUS
  Filled 2013-05-24 (×2): qty 1

## 2013-05-24 MED ORDER — OXYCODONE-ACETAMINOPHEN 5-325 MG PO TABS: 1.0000 | ORAL_TABLET | ORAL | Status: DC | PRN

## 2013-05-24 MED ORDER — FLEET ENEMA 7-19 GM/118ML RE ENEM: 1.0000 | ENEMA | RECTAL | Status: DC | PRN

## 2013-05-24 MED ORDER — IBUPROFEN 600 MG PO TABS: 600.0000 mg | ORAL_TABLET | Freq: Four times a day (QID) | ORAL | Status: DC | PRN

## 2013-05-24 MED ORDER — CALCIUM CARBONATE ANTACID 500 MG PO CHEW
2.0000 | CHEWABLE_TABLET | ORAL | Status: DC | PRN
  Administered 2013-05-24: 400 mg via ORAL
  Filled 2013-05-24 (×2): qty 1

## 2013-05-24 MED ORDER — ZOLPIDEM TARTRATE 5 MG PO TABS
5.0000 mg | ORAL_TABLET | Freq: Every evening | ORAL | Status: DC | PRN
  Administered 2013-05-25: 5 mg via ORAL
  Filled 2013-05-24: qty 1
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
## 2013-05-25 ENCOUNTER — Inpatient Hospital Stay (HOSPITAL_COMMUNITY): Payer: Medicaid Other

## 2013-05-25 ENCOUNTER — Encounter (HOSPITAL_COMMUNITY): Payer: Self-pay

## 2013-05-25 ENCOUNTER — Encounter (HOSPITAL_COMMUNITY): Payer: Medicaid Other | Admitting: Anesthesiology

## 2013-05-25 ENCOUNTER — Encounter (HOSPITAL_COMMUNITY)
Admission: RE | Disposition: A | Discharge status: Home / Self Care | Payer: Self-pay | Source: Ambulatory Visit | Attending: Obstetrics and Gynecology

## 2013-05-25 ENCOUNTER — Inpatient Hospital Stay (HOSPITAL_COMMUNITY): Payer: Medicaid Other | Admitting: Anesthesiology

## 2013-05-25 DIAGNOSIS — O4290 Premature rupture of membranes, unspecified as to length of time between rupture and onset of labor, unspecified weeks of gestation: Secondary | ICD-10-CM | POA: Diagnosis present

## 2013-05-25 LAB — COMPREHENSIVE METABOLIC PANEL
ALT: 6 U/L (ref 0–35)
AST: 15 U/L (ref 0–37)
Albumin: 2.3 g/dL — ABNORMAL LOW (ref 3.5–5.2)
Alkaline Phosphatase: 99 U/L (ref 39–117)
BUN: 4 mg/dL — ABNORMAL LOW (ref 6–23)
CALCIUM: 8.6 mg/dL (ref 8.4–10.5)
CHLORIDE: 100 meq/L (ref 96–112)
CO2: 20 meq/L (ref 19–32)
Creatinine, Ser: 0.75 mg/dL (ref 0.50–1.10)
GFR calc Af Amer: >90 mL/min (ref >90)
Glucose, Bld: 105 mg/dL — ABNORMAL HIGH (ref 70–99)
Potassium: 3.7 mEq/L (ref 3.7–5.3)
SODIUM: 133 meq/L — AB (ref 137–147)
Total Bilirubin: 0.4 mg/dL (ref 0.3–1.2)
Total Protein: 6 g/dL (ref 6.0–8.3)

## 2013-05-25 LAB — URINALYSIS, DIPSTICK ONLY
Bilirubin Urine: NEGATIVE
Glucose, UA: NEGATIVE mg/dL
Ketones, ur: 15 mg/dL — AB
NITRITE: NEGATIVE
Protein, ur: NEGATIVE mg/dL
UROBILINOGEN UA: 0.2 mg/dL (ref 0.0–1.0)
pH: 5.5 (ref 5.0–8.0)

## 2013-05-25 LAB — RPR: RPR Ser Ql: NONREACTIVE

## 2013-05-25 LAB — CBC
HCT: 32.1 % — ABNORMAL LOW (ref 36.0–46.0)
Hemoglobin: 10.8 g/dL — ABNORMAL LOW (ref 12.0–15.0)
MCH: 30 pg (ref 26.0–34.0)
MCHC: 33.6 g/dL (ref 30.0–36.0)
MCV: 89.2 fL (ref 78.0–100.0)
Platelets: 249 10*3/uL (ref 150–400)
RBC: 3.6 MIL/uL — AB (ref 3.87–5.11)
RDW: 13.2 % (ref 11.5–15.5)
WBC: 21.3 10*3/uL — AB (ref 4.0–10.5)

## 2013-05-25 SURGERY — Surgical Case
Anesthesia: Epidural | Site: Abdomen

## 2013-05-25 MED ORDER — DIPHENHYDRAMINE HCL 50 MG/ML IJ SOLN: 12.5000 mg | INTRAMUSCULAR | Status: DC | PRN

## 2013-05-25 MED ORDER — SCOPOLAMINE 1 MG/3DAYS TD PT72
1.0000 | MEDICATED_PATCH | Freq: Once | TRANSDERMAL | Status: DC
  Administered 2013-05-25: 1.5 mg via TRANSDERMAL

## 2013-05-25 MED ORDER — FENTANYL CITRATE 0.05 MG/ML IJ SOLN
INTRAMUSCULAR | Status: DC | PRN
  Administered 2013-05-25 (×2): 25 ug via INTRAVENOUS
  Administered 2013-05-25: 50 ug via INTRAVENOUS

## 2013-05-25 MED ORDER — ESMOLOL HCL 10 MG/ML IV SOLN
INTRAVENOUS | Status: DC | PRN
  Administered 2013-05-25: 100 mg via INTRAVENOUS

## 2013-05-25 MED ORDER — CHLOROPROCAINE HCL 3 % IJ SOLN
INTRAMUSCULAR | Status: AC
  Filled 2013-05-25: qty 20

## 2013-05-25 MED ORDER — MEPERIDINE HCL 25 MG/ML IJ SOLN
INTRAMUSCULAR | Status: DC | PRN
  Administered 2013-05-25: 25 mg via INTRAVENOUS

## 2013-05-25 MED ORDER — EPHEDRINE 5 MG/ML INJ: 10.0000 mg | INTRAVENOUS | Status: DC | PRN

## 2013-05-25 MED ORDER — PHENYLEPHRINE 40 MCG/ML (10ML) SYRINGE FOR IV PUSH (FOR BLOOD PRESSURE SUPPORT)
80.0000 ug | PREFILLED_SYRINGE | INTRAVENOUS | Status: AC | PRN
  Administered 2013-05-25 (×4): 80 ug via INTRAVENOUS
  Administered 2013-05-25: 120 ug via INTRAVENOUS
  Filled 2013-05-25: qty 10

## 2013-05-25 MED ORDER — PROMETHAZINE HCL 25 MG/ML IJ SOLN: 6.2500 mg | INTRAMUSCULAR | Status: DC | PRN

## 2013-05-25 MED ORDER — PHENYLEPHRINE 8 MG IN D5W 100 ML (0.08MG/ML) PREMIX OPTIME
INJECTION | INTRAVENOUS | Status: DC | PRN
  Administered 2013-05-25: 80 ug/min via INTRAVENOUS

## 2013-05-25 MED ORDER — KETOROLAC TROMETHAMINE 30 MG/ML IJ SOLN
INTRAMUSCULAR | Status: AC
  Filled 2013-05-25: qty 1

## 2013-05-25 MED ORDER — KETOROLAC TROMETHAMINE 30 MG/ML IJ SOLN: 30.0000 mg | Freq: Four times a day (QID) | INTRAMUSCULAR | Status: DC | PRN

## 2013-05-25 MED ORDER — LIDOCAINE HCL (PF) 1 % IJ SOLN
INTRAMUSCULAR | Status: DC | PRN
  Administered 2013-05-25: 8 mL
  Administered 2013-05-25: 9 mL

## 2013-05-25 MED ORDER — SCOPOLAMINE 1 MG/3DAYS TD PT72
MEDICATED_PATCH | TRANSDERMAL | Status: AC
  Filled 2013-05-25: qty 1

## 2013-05-25 MED ORDER — EPHEDRINE 5 MG/ML INJ
10.0000 mg | INTRAVENOUS | Status: DC | PRN
  Filled 2013-05-25: qty 4

## 2013-05-25 MED ORDER — FENTANYL 2.5 MCG/ML BUPIVACAINE 1/10 % EPIDURAL INFUSION (WH - ANES)
14.0000 mL/h | INTRAMUSCULAR | Status: DC | PRN
  Administered 2013-05-25: 14 mL/h via EPIDURAL
  Filled 2013-05-25 (×3): qty 125

## 2013-05-25 MED ORDER — HYDROMORPHONE HCL PF 1 MG/ML IJ SOLN: 0.2500 mg | INTRAMUSCULAR | Status: DC | PRN

## 2013-05-25 MED ORDER — MORPHINE SULFATE 0.5 MG/ML IJ SOLN
INTRAMUSCULAR | Status: AC
  Filled 2013-05-25: qty 10

## 2013-05-25 MED ORDER — TERBUTALINE SULFATE 1 MG/ML IJ SOLN: 0.2500 mg | Freq: Once | INTRAMUSCULAR | Status: AC | PRN

## 2013-05-25 MED ORDER — SODIUM CHLORIDE 0.9 % IV SOLN
3.0000 g | Freq: Four times a day (QID) | INTRAVENOUS | Status: AC
  Administered 2013-05-26 – 2013-05-27 (×6): 3 g via INTRAVENOUS
  Filled 2013-05-25 (×6): qty 3

## 2013-05-25 MED ORDER — CHLOROPROCAINE HCL 3 % IJ SOLN
INTRAMUSCULAR | Status: DC | PRN
  Administered 2013-05-25: 20 mL

## 2013-05-25 MED ORDER — SODIUM BICARBONATE 8.4 % IV SOLN
INTRAVENOUS | Status: DC | PRN
  Administered 2013-05-25: 3 mL via EPIDURAL
  Administered 2013-05-25: 5 mL via EPIDURAL
  Administered 2013-05-25: 10 mL via EPIDURAL

## 2013-05-25 MED ORDER — MORPHINE SULFATE (PF) 0.5 MG/ML IJ SOLN
INTRAMUSCULAR | Status: DC | PRN
  Administered 2013-05-25: 4 mg via EPIDURAL

## 2013-05-25 MED ORDER — MEPERIDINE HCL 25 MG/ML IJ SOLN: 6.2500 mg | INTRAMUSCULAR | Status: DC | PRN

## 2013-05-25 MED ORDER — KETOROLAC TROMETHAMINE 30 MG/ML IJ SOLN
30.0000 mg | Freq: Four times a day (QID) | INTRAMUSCULAR | Status: DC | PRN
  Administered 2013-05-25: 30 mg via INTRAVENOUS

## 2013-05-25 MED ORDER — LACTATED RINGERS IV SOLN: 500.0000 mL | Freq: Once | INTRAVENOUS | Status: DC

## 2013-05-25 MED ORDER — OXYTOCIN 40 UNITS IN LACTATED RINGERS INFUSION - SIMPLE MED
1.0000 m[IU]/min | INTRAVENOUS | Status: DC
  Administered 2013-05-25: 5 m[IU]/min via INTRAVENOUS
  Administered 2013-05-25: 1 m[IU]/min via INTRAVENOUS
  Administered 2013-05-25: 3 m[IU]/min via INTRAVENOUS
  Administered 2013-05-25: 4 m[IU]/min via INTRAVENOUS
  Administered 2013-05-25: 6 m[IU]/min via INTRAVENOUS

## 2013-05-25 MED ORDER — MEPERIDINE HCL 25 MG/ML IJ SOLN
INTRAMUSCULAR | Status: AC
  Filled 2013-05-25: qty 1

## 2013-05-25 MED ORDER — FENTANYL CITRATE 0.05 MG/ML IJ SOLN
INTRAMUSCULAR | Status: AC
  Filled 2013-05-25: qty 2

## 2013-05-25 MED ORDER — PHENYLEPHRINE 40 MCG/ML (10ML) SYRINGE FOR IV PUSH (FOR BLOOD PRESSURE SUPPORT): 80.0000 ug | PREFILLED_SYRINGE | INTRAVENOUS | Status: DC | PRN

## 2013-05-25 MED ORDER — CEFAZOLIN SODIUM-DEXTROSE 2-3 GM-% IV SOLR
INTRAVENOUS | Status: DC | PRN
Start: 2013-05-25 — End: 2013-05-25
  Administered 2013-05-25: 2 g via INTRAVENOUS

## 2013-05-25 MED ORDER — FENTANYL 2.5 MCG/ML BUPIVACAINE 1/10 % EPIDURAL INFUSION (WH - ANES)
INTRAMUSCULAR | Status: DC | PRN
  Administered 2013-05-25: 14 mL/h via EPIDURAL
  Administered 2013-05-25: 20:00:00

## 2013-05-25 MED ORDER — KETOROLAC TROMETHAMINE 30 MG/ML IJ SOLN: 15.0000 mg | Freq: Once | INTRAMUSCULAR | Status: AC | PRN

## 2013-05-25 MED ORDER — LACTATED RINGERS IV SOLN: INTRAVENOUS | Status: DC

## 2013-05-25 MED ORDER — 0.9 % SODIUM CHLORIDE (POUR BTL) OPTIME
TOPICAL | Status: DC | PRN
  Administered 2013-05-25: 1000 mL

## 2013-05-25 MED ORDER — MORPHINE SULFATE (PF) 0.5 MG/ML IJ SOLN
INTRAMUSCULAR | Status: DC | PRN
  Administered 2013-05-25: 1 mg via INTRAVENOUS

## 2013-05-25 SURGICAL SUPPLY — 42 items
BARRIER ADHS 3X4 INTERCEED (GAUZE/BANDAGES/DRESSINGS) ×3 IMPLANT
BENZOIN TINCTURE PRP APPL 2/3 (GAUZE/BANDAGES/DRESSINGS) ×3 IMPLANT
CLAMP CORD UMBIL (MISCELLANEOUS) IMPLANT
CLOSURE WOUND 1/2 X4 (GAUZE/BANDAGES/DRESSINGS) ×1
CLOTH BEACON ORANGE TIMEOUT ST (SAFETY) ×3 IMPLANT
DERMABOND ADVANCED (GAUZE/BANDAGES/DRESSINGS)
DERMABOND ADVANCED .7 DNX12 (GAUZE/BANDAGES/DRESSINGS) IMPLANT
DRAPE LG THREE QUARTER DISP (DRAPES) IMPLANT
DRSG OPSITE POSTOP 4X10 (GAUZE/BANDAGES/DRESSINGS) ×3 IMPLANT
DURAPREP 26ML APPLICATOR (WOUND CARE) ×3 IMPLANT
ELECT REM PT RETURN 9FT ADLT (ELECTROSURGICAL) ×3
ELECTRODE REM PT RTRN 9FT ADLT (ELECTROSURGICAL) ×1 IMPLANT
EXTRACTOR VACUUM KIWI (MISCELLANEOUS) IMPLANT
GAUZE SPONGE 4X4 12PLY STRL LF (GAUZE/BANDAGES/DRESSINGS) ×3 IMPLANT
GLOVE BIO SURGEON STRL SZ 6.5 (GLOVE) ×2 IMPLANT
GLOVE BIO SURGEONS STRL SZ 6.5 (GLOVE) ×1
GLOVE BIOGEL PI IND STRL 6.5 (GLOVE) ×1 IMPLANT
GLOVE BIOGEL PI INDICATOR 6.5 (GLOVE) ×2
GOWN STRL REUS W/TWL LRG LVL3 (GOWN DISPOSABLE) ×6 IMPLANT
KIT ABG SYR 3ML LUER SLIP (SYRINGE) IMPLANT
NEEDLE HYPO 22GX1.5 SAFETY (NEEDLE) ×3 IMPLANT
NEEDLE HYPO 25X5/8 SAFETYGLIDE (NEEDLE) IMPLANT
NS IRRIG 1000ML POUR BTL (IV SOLUTION) ×3 IMPLANT
PACK C SECTION WH (CUSTOM PROCEDURE TRAY) ×3 IMPLANT
PAD ABD 7.5X8 STRL (GAUZE/BANDAGES/DRESSINGS) ×3 IMPLANT
PAD OB MATERNITY 4.3X12.25 (PERSONAL CARE ITEMS) ×3 IMPLANT
RTRCTR C-SECT PINK 25CM LRG (MISCELLANEOUS) ×3 IMPLANT
STRIP CLOSURE SKIN 1/2X4 (GAUZE/BANDAGES/DRESSINGS) ×2 IMPLANT
SUT PLAIN 0 NONE (SUTURE) IMPLANT
SUT PLAIN 2 0 (SUTURE) ×2
SUT PLAIN 2 0 XLH (SUTURE) IMPLANT
SUT PLAIN ABS 2-0 CT1 27XMFL (SUTURE) ×1 IMPLANT
SUT VIC AB 0 CT1 27 (SUTURE) ×4
SUT VIC AB 0 CT1 27XBRD ANBCTR (SUTURE) ×2 IMPLANT
SUT VIC AB 0 CTX 36 (SUTURE) ×6
SUT VIC AB 0 CTX36XBRD ANBCTRL (SUTURE) ×3 IMPLANT
SUT VIC AB 4-0 KS 27 (SUTURE) ×3 IMPLANT
SYR CONTROL 10ML LL (SYRINGE) ×3 IMPLANT
TAPE CLOTH SURG 4X10 WHT LF (GAUZE/BANDAGES/DRESSINGS) ×3 IMPLANT
TOWEL OR 17X24 6PK STRL BLUE (TOWEL DISPOSABLE) ×3 IMPLANT
TRAY FOLEY CATH 14FR (SET/KITS/TRAYS/PACK) IMPLANT
WATER STERILE IRR 1000ML POUR (IV SOLUTION) ×3 IMPLANT

## 2013-05-25 NOTE — Op Note (Signed)
PreOp Diagnosis:  1) Intrauterine pregnancy @ [redacted]w[redacted]d, 2) Failure to progress 3) Cat. II tracing 4) Obesity PostOp Diagnosis: same Procedure: Primary Low transverse C-section Surgeon: Dr. Janyth Pupa Assistant: Donnel Saxon, CNM Anesthesia: epidural Complications: none EBL: 600cc  INDICATIONS: 28yo G1P0 @ [redacted]w[redacted]d who presented for IOL due to postdates and spontaneously ruptured on arrival..    Pregnancy complicated by: -tobacco use -anemia -obesity, BMI 35 -h/o HTN  She was initially managed with expectant management then augmented with Pitocin.  She received an epidural for pain.  Internal monitors both IUPC & FSE were placed for further monitoring.  Amnioinfusion was also used for Cat. II tracing.  Due to persistent Cat.II tracing with failure to progress, maximum dilation 5cm, the patient was taken for a primary C-section.  Findings: Female infant from OP presentation, APGARs: 9,9, weight 8#10oz  PROCEDURE:  Informed consent was obtained from the patient with risks, benefits, complications, treatment options, and expected outcomes discussed with the patient.  The patient concurred with the proposed plan, giving informed consent with form signed.   The patient was taken to Operating Room, and identified with the procedure verified as C-Section Delivery with Time Out. With induction of anesthesia, the patient was prepped and draped in the usual sterile fashion. A Pfannenstiel incision was made and carried down through the subcutaneous tissue to the fascia. The fascia was incised in the midline and extended transversely. The superior aspect of the fascial incision was grasped with Kochers elevated and the underlying muscle dissected off. The inferior aspect of the facial incision was in similar fashion, grasped elevated and rectus muscles dissected off. The peritoneum was identified and entered. Peritoneal incision was extended longitudinally. The utero-vesical peritoneal reflection was  identified and incised transversely with the [facility] scissors, the incision extended laterally, the bladder flap created digitally. A low transverse uterine incision was made and the infants head delivered atraumatically. After the umbilical cord was clamped and cut cord blood was obtained for evaluation.   The placenta was removed intact and appeared normal. The uterine outline, tubes and ovaries appeared normal. The uterine incision was closed with running locked sutures of 0 Vicryl and a second layer of the same stitch was used in an imbricating fashion.  Excellent hemostasis was obtained.  The pericolic gutters were then cleared of all clots and debris. Interceed was placed over the hysterotomy. The fascia was then reapproximated with running sutures of 0 Vicryl. The subcutaneous tissue was reapproximated with 2-0 plain gut suture.  The skin was closed with 4-0 vicryl in a subcuticular fashion.  Instrument, sponge, and needle counts were correct prior the abdominal closure and at the conclusion of the case.  The patient was taken to recovery room in guarded condition as the patient was noted to have persistent tachycardia please see delivery note for further information regarding postdelivery care.  Janyth Pupa, DO [phone] (pager) [phone] (office)

## 2013-05-25 NOTE — Progress Notes (Signed)
  Subjective: Comfortable with epidural.  Family at bedside.  Objective: BP 116/73  Pulse 114  Temp(Src) 98.9 F (37.2 C) (Oral)  Resp 20  Ht 5\' 5"  (1.651 m)  Wt 230 lb (104.327 kg)  BMI 38.27 kg/m2  SpO2 98%  LMP 08/19/2012 I/O last 3 completed shifts: In: -  Out: 500 [Urine:500]    FHT: Category 1 UC:   regular, every 2-3 minutes SVE:   5 cm, 100%, vtx, slight molding, with vtx at -1. Moderate MSF noted Pitocin at 6 mu/min IUPC and FSE inserted  Assessment:  Progressive labor PROM x 24 hours GBS negative Moderate MSF  Plan: Amnioinfusion Augment to establish/maintain adequacy. Dr. Nelda Marseille updated Position to facilitate rotation/descent.  Donnel Saxon 05/25/2013, 7:45 PM

## 2013-05-25 NOTE — Progress Notes (Signed)
  Subjective: Pt asleep, will return.  Objective: BP 141/89  Pulse 109  Temp(Src) 98.5 F (36.9 C) (Oral)  Resp 16  Ht 5\' 8"  (1.727 m)  Wt 314 lb (142.429 kg)  BMI 47.75 kg/m2  SpO2 100%  LMP 08/20/2012      FHT: Category I UC:   irregular, every 8 minutes   SVE:   Dilation: 2 Effacement (%): 70 Station: -1 Exam by:: Jocelyn Lamer CNM    Augmentation of labor, progressing well  Labor: Progressing on Pitocin, Pitocin on 3 miliU  Preeclampsia: no signs or symptoms of toxicity  Fetal Wellbeing: Category I  Pain Control: Epidural  I/D: GBS neg; SROM at 1900 (4/3); Afebrile  Anticipated MOD: NSVD    Mindi Akerson 05/25/2013, 7:23 AM

## 2013-05-25 NOTE — Transfer of Care (Signed)
Immediate Anesthesia Transfer of Care Note  Patient: Sally Garner  Procedure(s) Performed: Procedure(s): CESAREAN SECTION (N/A)  Patient Location: PACU  Anesthesia Type:Regional  Level of Consciousness: awake, alert  and oriented  Airway & Oxygen Therapy: Patient Spontanous Breathing and Patient connected to nasal cannula oxygen  Post-op Assessment: Post -op Vital signs reviewed and unstable, Anesthesiologist notified  Post vital signs: unstable  Complications: No apparent anesthesia complications

## 2013-05-25 NOTE — Progress Notes (Addendum)
  Subjective: Comfortable with epidural.  Objective: BP 123/86  Pulse 137  Temp(Src) 99.1 F (37.3 C) (Oral)  Resp 20  Ht 5\' 5"  (1.651 m)  Wt 230 lb (104.327 kg)  BMI 38.27 kg/m2  SpO2 98%  LMP 08/19/2012 I/O last 3 completed shifts: In: -  Out: 500 [Urine:500]    FHT: Category 2--decreased variability, unresponsive to position change and O2.  Baseline 150-155, no decels. UC:   regular, every 2 minutes SVE:   Dilation: 5 Effacement (%): 100 Station: -1;0 Exam by:: V. Andrey Mccaskill,CNM Pitocin at 7 mu/min. MVUs 150. Brief response to scalp stim, then back to decreased variability  Assessment:  Arrest of active phase Category 2 FHR, with decreased variability PROM x 26 hours  Plan: Consulted with Dr. Nelda Marseille, with her now at bedside. Reviewed status of FHR tracing, lack of cervical change, and MSF with patient and family.   Discussed options of continued observation with risk of fetal compromise, or progressing to C/S for non-reassuring FHR and lack of progress in labor. Patient and family request brief time to discuss issues among themselves. Pitocin off per Dr. Annie Main order. Will f/u in 15 min for further discussion.  Donnel Saxon 05/25/2013, 9:16 PM  Addendum: In to discuss status with patient and family. Variability improved slightly with d/c of pitocin, but would need pitocin to facilitate further progress. C/S recommended by Dr. Nelda Marseille. Patient agreeable with plan. Risks of C/S reviewed, including bleeding, infection, and damage to other organs.  Patient and family seem to understand these risks and are agreeable with proceeding.  Donnel Saxon, CNM 05/25/13 9:35p

## 2013-05-25 NOTE — Anesthesia Procedure Notes (Signed)
Epidural Patient location during procedure: OB Start time: 05/25/2013 6:30 AM End time: 05/25/2013 6:34 AM  Staffing Anesthesiologist: Lyn Hollingshead Performed by: anesthesiologist   Preanesthetic Checklist Completed: patient identified, surgical consent, pre-op evaluation, timeout performed, IV checked, risks and benefits discussed and monitors and equipment checked  Epidural Patient position: sitting Prep: site prepped and draped and DuraPrep Patient monitoring: continuous pulse ox and blood pressure Approach: midline Location: L3-L4 Injection technique: LOR air  Needle:  Needle type: Tuohy  Needle gauge: 17 G Needle length: 9 cm and 9 Needle insertion depth: 6 cm Catheter type: closed end flexible Catheter size: 19 Gauge Catheter at skin depth: 11 cm Test dose: negative and Other  Assessment Sensory level: T9 Events: blood not aspirated, injection not painful, no injection resistance, negative IV test and no paresthesia  Additional Notes Reason for block:procedure for pain

## 2013-05-25 NOTE — Anesthesia Preprocedure Evaluation (Signed)
Anesthesia Evaluation  Patient identified by MRN, date of birth, ID band Patient awake    Reviewed: Allergy & Precautions, H&P , NPO status , Patient's Chart, lab work & pertinent test results  Airway Mallampati: II TM Distance: >3 FB Neck ROM: full    Dental no notable dental hx.    Pulmonary former smoker,    Pulmonary exam normal       Cardiovascular hypertension,     Neuro/Psych negative neurological ROS  negative psych ROS   GI/Hepatic negative GI ROS, Neg liver ROS,   Endo/Other  Morbid obesity  Renal/GU negative Renal ROS     Musculoskeletal   Abdominal (+) + obese,   Peds  Hematology   Anesthesia Other Findings   Reproductive/Obstetrics (+) Pregnancy                           Anesthesia Physical Anesthesia Plan  ASA: III  Anesthesia Plan: Epidural   Post-op Pain Management:    Induction:   Airway Management Planned:   Additional Equipment:   Intra-op Plan:   Post-operative Plan:   Informed Consent: I have reviewed the patients History and Physical, chart, labs and discussed the procedure including the risks, benefits and alternatives for the proposed anesthesia with the patient or authorized representative who has indicated his/her understanding and acceptance.     Plan Discussed with:   Anesthesia Plan Comments:         Anesthesia Quick Evaluation

## 2013-05-25 NOTE — Progress Notes (Signed)
  Subjective: Pt is comfortable with epidural.  Family at bedside and supportive.  Objective: BP 141/89  Pulse 109  Temp(Src) 98.5 F (36.9 C) (Oral)  Resp 16  Ht 5\' 8"  (1.727 m)  Wt 314 lb (142.429 kg)  BMI 47.75 kg/m2  SpO2 100%  LMP 08/20/2012      FHT: Category I UC:   regular, every 2-3 minutes   Dilation: 2 Effacement (%): 382;50 Cervical Position: Anterior Station: 0 Presentation: Vertex Exam by:: Kaeleigh Westendorf cnm   Augmentation of labor, progressing well  Labor: Progressing on Pitocin, Pitocin at 6 miliU  Preeclampsia: no signs or symptoms of toxicity  Fetal Wellbeing: Category I  Pain Control: Epidural  I/D: GBS neg; SROM at 1900 (4/3); Afebrile   Anticipated MOD: NSVD    Sally Garner 05/25/2013, 7:23 AM

## 2013-05-25 NOTE — Progress Notes (Signed)
  Subjective: Requested VE--family wants to leave to get food, "don't want to miss anything".  Patient more aware of UCs now, moderately uncomfortable with UCs.  Objective: BP 138/85  Pulse 120  Temp(Src) 98.7 F (37.1 C) (Oral)  Resp 18  Ht 5\' 5"  (1.651 m)  Wt 230 lb (104.327 kg)  BMI 38.27 kg/m2  LMP 08/19/2012      FHT: Category 1 UC:   regular, every 3-5 minutes SVE:   Dilation: 1 Effacement (%): 90 Station: -2 Exam by:: VCira Servant Leaking moderate MSF  Assessment:  Latent phase labor SROM at 7pm Postdates GBS negative Moderate MSF  Plan: Continue observation of labor status--if no significant change by early am, will consider augmentation. Ambien if desired.  [place], [place] 05/24/13, 2355

## 2013-05-25 NOTE — Progress Notes (Signed)
  Subjective: Sleeping soundly--took Ambien at 0017.  Objective: BP 127/79  Pulse 104  Temp(Src) 98.7 F (37.1 C) (Oral)  Resp 18  Ht 5\' 5"  (1.651 m)  Wt 230 lb (104.327 kg)  BMI 38.27 kg/m2  LMP 08/19/2012      FHT: Category 2 at present, with decreased variability since Ambien, no decels UC:  Irregular, q 3-6 min SVE:  Deferred at present  Assessment:  Latent phase labor SROM at 7pm GBS negative  Plan: Will recheck by 6 am and augment prn.  Sally Garner, [place] 05/25/2013, 3:24 AM

## 2013-05-25 NOTE — Anesthesia Postprocedure Evaluation (Signed)
Anesthesia Post Note  Patient: Sally Garner  Procedure(s) Performed: Procedure(s) (LRB): CESAREAN SECTION (N/A)  Anesthesia type: Epidural  Patient location: PACU  Post pain: Pain level controlled  Post assessment: Post-op Vital signs reviewed  Last Vitals:  Filed Vitals:   05/25/13 2345  BP: 129/88  Pulse: 126  Temp:   Resp: 15    Post vital signs: Reviewed  Level of consciousness: awake  Complications: No apparent anesthesia complications. Patient will go to AICU because of unexplained tachycardia intra and post procedure. ECG shows sinus tachycardia with no evidence of ischemia or rightward shift. Hgb is essentially unchanged from preop. Uterus is firm. Urine SG is less than 1.005. CXR appears normal to me and O2 sat is 100% on 4lO2. I have personally contacted the critical care service with Dr. Nelda Marseille present. The patient has a history of chronic hypertension that has not been treated. She will be started on Labetalol. Her only complaint is feeling cold despite a fever of 100.5.

## 2013-05-25 NOTE — Progress Notes (Signed)
Internal monitors out, amnio discontinued per orders, pt transferred to stretcher and taken to [place] for c-section

## 2013-05-25 NOTE — Progress Notes (Signed)
At bedside to discuss current management due to failure to progress with persistent Cat. II tracing will plan for primary C-section.  Risk benefits and alternatives of cesarean section were discussed with the patient including but not limited to infection, bleeding, damage to other organs or baby with the need for further surgery. Pt voiced understanding and desires to proceed. Pitocin discontinued, anesthesia and OR notified.  Janyth Pupa, DO [phone] (pager) [phone] (office)

## 2013-05-25 NOTE — Progress Notes (Signed)
  Subjective: Has received 2 doses of Stadol at 0147 and 0400--reports IV med works very short term.  Objective: BP 134/89  Pulse 107  Temp(Src) 98.3 F (36.8 C) (Oral)  Resp 20  Ht 5\' 5"  (1.651 m)  Wt 230 lb (104.327 kg)  BMI 38.27 kg/m2  LMP 08/19/2012      Filed Vitals:   05/25/13 0200 05/25/13 0300 05/25/13 0400 05/25/13 0500  BP: 135/86 127/79 126/89 134/89  Pulse: 112 104 106 107  Temp: 98.7 F (37.1 C)  98.3 F (36.8 C)   TempSrc: Oral  Oral   Resp: 18 18 20 20   Height:      Weight:         FHT: Category 2 since Stadol doses, early decels with some UCs UC:   regular, every 6 minutes SVE:   Tight 1 cm, 90%, vtx, -1--cervix has very tight band around os.  Assessment:  Latent phase SROM 7pm GBS negative Moderate MSF  Plan: Plan pitocin augmentation per low dose protocol. Pain med/epidural prn.  Donnel Saxon 05/25/2013, 5:35 AM

## 2013-05-25 NOTE — Consult Note (Signed)
Neonatology Note:   Attendance at C-section:    I was asked by Dr. Nelda Marseille to attend this primary C/S at 41 weeks due to FTP, failed induction. The mother is a G1P0 O pos, GBS neg with smoking, fibroids, and obesity. ROM 27 hours prior to delivery, fluid with thin meconium. Infant vigorous with good spontaneous cry and tone. Needed only minimal bulb suctioning. Ap 9/9. Lungs clear to ausc in DR. To CN to care of Pediatrician.   Real Cons, MD

## 2013-05-26 ENCOUNTER — Encounter (HOSPITAL_COMMUNITY): Payer: Self-pay

## 2013-05-26 DIAGNOSIS — Z98891 History of uterine scar from previous surgery: Secondary | ICD-10-CM

## 2013-05-26 LAB — CBC WITH DIFFERENTIAL/PLATELET
Basophils Absolute: 0 10*3/uL (ref 0.0–0.1)
Basophils Relative: 0 % (ref 0–1)
EOS PCT: 0 % (ref 0–5)
Eosinophils Absolute: 0 10*3/uL (ref 0.0–0.7)
HCT: 28.8 % — ABNORMAL LOW (ref 36.0–46.0)
Hemoglobin: 9.7 g/dL — ABNORMAL LOW (ref 12.0–15.0)
LYMPHS PCT: 15 % (ref 12–46)
Lymphs Abs: 2.4 10*3/uL (ref 0.7–4.0)
MCH: 29.5 pg (ref 26.0–34.0)
MCHC: 33.7 g/dL (ref 30.0–36.0)
MCV: 87.5 fL (ref 78.0–100.0)
Monocytes Absolute: 1.8 10*3/uL — ABNORMAL HIGH (ref 0.1–1.0)
Monocytes Relative: 11 % (ref 3–12)
NEUTROS ABS: 12.5 10*3/uL — AB (ref 1.7–7.7)
Neutrophils Relative %: 74 % (ref 43–77)
PLATELETS: 226 10*3/uL (ref 150–400)
RBC: 3.29 MIL/uL — ABNORMAL LOW (ref 3.87–5.11)
RDW: 13.3 % (ref 11.5–15.5)
WBC: 16.7 10*3/uL — AB (ref 4.0–10.5)

## 2013-05-26 MED ORDER — OXYCODONE-ACETAMINOPHEN 5-325 MG PO TABS
1.0000 | ORAL_TABLET | ORAL | Status: DC | PRN
  Administered 2013-05-26: 2 via ORAL
  Administered 2013-05-26 – 2013-05-27 (×2): 1 via ORAL
  Filled 2013-05-26: qty 1
  Filled 2013-05-26: qty 2
  Filled 2013-05-26: qty 1

## 2013-05-26 MED ORDER — NALOXONE HCL 0.4 MG/ML IJ SOLN: 0.4000 mg | INTRAMUSCULAR | Status: DC | PRN

## 2013-05-26 MED ORDER — CEFAZOLIN SODIUM-DEXTROSE 2-3 GM-% IV SOLR
INTRAVENOUS | Status: AC
  Filled 2013-05-26: qty 50

## 2013-05-26 MED ORDER — IBUPROFEN 600 MG PO TABS
600.0000 mg | ORAL_TABLET | Freq: Four times a day (QID) | ORAL | Status: DC
  Administered 2013-05-26 – 2013-05-28 (×9): 600 mg via ORAL
  Filled 2013-05-26 (×9): qty 1

## 2013-05-26 MED ORDER — DIPHENHYDRAMINE HCL 50 MG/ML IJ SOLN: 25.0000 mg | INTRAMUSCULAR | Status: DC | PRN

## 2013-05-26 MED ORDER — WITCH HAZEL-GLYCERIN EX PADS: 1.0000 "application " | MEDICATED_PAD | CUTANEOUS | Status: DC | PRN

## 2013-05-26 MED ORDER — SIMETHICONE 80 MG PO CHEW
80.0000 mg | CHEWABLE_TABLET | Freq: Three times a day (TID) | ORAL | Status: DC
  Administered 2013-05-26 – 2013-05-28 (×5): 80 mg via ORAL
  Filled 2013-05-26 (×5): qty 1

## 2013-05-26 MED ORDER — SODIUM CHLORIDE 0.9 % IV SOLN
250.0000 mL | INTRAVENOUS | Status: DC | PRN
  Administered 2013-05-26: 500 mL via INTRAVENOUS

## 2013-05-26 MED ORDER — NALOXONE HCL 1 MG/ML IJ SOLN
1.0000 ug/kg/h | INTRAVENOUS | Status: DC | PRN
  Filled 2013-05-26: qty 2

## 2013-05-26 MED ORDER — METOCLOPRAMIDE HCL 5 MG/ML IJ SOLN: 10.0000 mg | Freq: Three times a day (TID) | INTRAMUSCULAR | Status: DC | PRN

## 2013-05-26 MED ORDER — TETANUS-DIPHTH-ACELL PERTUSSIS 5-2.5-18.5 LF-MCG/0.5 IM SUSP
0.5000 mL | Freq: Once | INTRAMUSCULAR | Status: DC
  Filled 2013-05-26: qty 0.5

## 2013-05-26 MED ORDER — SIMETHICONE 80 MG PO CHEW
80.0000 mg | CHEWABLE_TABLET | ORAL | Status: DC | PRN
  Administered 2013-05-27: 80 mg via ORAL

## 2013-05-26 MED ORDER — OXYTOCIN 10 UNIT/ML IJ SOLN
INTRAMUSCULAR | Status: AC
  Filled 2013-05-26: qty 4

## 2013-05-26 MED ORDER — ZOLPIDEM TARTRATE 5 MG PO TABS
5.0000 mg | ORAL_TABLET | Freq: Every evening | ORAL | Status: DC | PRN
Start: 2013-05-26 — End: 2013-05-28

## 2013-05-26 MED ORDER — SODIUM CHLORIDE 0.9 % IJ SOLN: 3.0000 mL | INTRAMUSCULAR | Status: DC | PRN

## 2013-05-26 MED ORDER — SODIUM CHLORIDE 0.9 % IJ SOLN
3.0000 mL | Freq: Two times a day (BID) | INTRAMUSCULAR | Status: DC
  Administered 2013-05-27: 3 mL via INTRAVENOUS

## 2013-05-26 MED ORDER — NALBUPHINE HCL 10 MG/ML IJ SOLN: 5.0000 mg | INTRAMUSCULAR | Status: DC | PRN

## 2013-05-26 MED ORDER — ONDANSETRON HCL 4 MG/2ML IJ SOLN
INTRAMUSCULAR | Status: AC
  Filled 2013-05-26: qty 2

## 2013-05-26 MED ORDER — IBUPROFEN 600 MG PO TABS: 600.0000 mg | ORAL_TABLET | Freq: Four times a day (QID) | ORAL | Status: DC | PRN

## 2013-05-26 MED ORDER — LACTATED RINGERS IV BOLUS (SEPSIS)
500.0000 mL | Freq: Once | INTRAVENOUS | Status: AC
  Administered 2013-05-26: 500 mL via INTRAVENOUS

## 2013-05-26 MED ORDER — DIPHENHYDRAMINE HCL 50 MG/ML IJ SOLN: 12.5000 mg | INTRAMUSCULAR | Status: DC | PRN

## 2013-05-26 MED ORDER — LACTATED RINGERS IV SOLN
INTRAVENOUS | Status: DC
Start: 2013-05-26 — End: 2013-05-26
  Administered 2013-05-26 (×2): via INTRAVENOUS

## 2013-05-26 MED ORDER — ONDANSETRON HCL 4 MG/2ML IJ SOLN: 4.0000 mg | Freq: Three times a day (TID) | INTRAMUSCULAR | Status: DC | PRN

## 2013-05-26 MED ORDER — OXYTOCIN 40 UNITS IN LACTATED RINGERS INFUSION - SIMPLE MED: 62.5000 mL/h | INTRAVENOUS | Status: AC

## 2013-05-26 MED ORDER — SENNOSIDES-DOCUSATE SODIUM 8.6-50 MG PO TABS
2.0000 | ORAL_TABLET | ORAL | Status: DC
  Administered 2013-05-26 – 2013-05-28 (×3): 2 via ORAL
  Filled 2013-05-26 (×3): qty 2

## 2013-05-26 MED ORDER — ONDANSETRON HCL 4 MG/2ML IJ SOLN: 4.0000 mg | INTRAMUSCULAR | Status: DC | PRN

## 2013-05-26 MED ORDER — PRENATAL MULTIVITAMIN CH
1.0000 | ORAL_TABLET | Freq: Every day | ORAL | Status: DC
  Administered 2013-05-26: 1 via ORAL
  Filled 2013-05-26: qty 1

## 2013-05-26 MED ORDER — LANOLIN HYDROUS EX OINT: 1.0000 "application " | TOPICAL_OINTMENT | CUTANEOUS | Status: DC | PRN

## 2013-05-26 MED ORDER — ENOXAPARIN SODIUM 40 MG/0.4ML ~~LOC~~ SOLN
40.0000 mg | SUBCUTANEOUS | Status: DC
  Administered 2013-05-26 – 2013-05-27 (×2): 40 mg via SUBCUTANEOUS
  Filled 2013-05-26 (×3): qty 0.4

## 2013-05-26 MED ORDER — ONDANSETRON HCL 4 MG PO TABS: 4.0000 mg | ORAL_TABLET | ORAL | Status: DC | PRN

## 2013-05-26 MED ORDER — DIBUCAINE 1 % RE OINT: 1.0000 "application " | TOPICAL_OINTMENT | RECTAL | Status: DC | PRN

## 2013-05-26 MED ORDER — SIMETHICONE 80 MG PO CHEW
80.0000 mg | CHEWABLE_TABLET | ORAL | Status: DC
  Administered 2013-05-26 (×2): 80 mg via ORAL
  Filled 2013-05-26 (×3): qty 1

## 2013-05-26 MED ORDER — DIPHENHYDRAMINE HCL 25 MG PO CAPS: 25.0000 mg | ORAL_CAPSULE | Freq: Four times a day (QID) | ORAL | Status: DC | PRN

## 2013-05-26 MED ORDER — MENTHOL 3 MG MT LOZG: 1.0000 | LOZENGE | OROMUCOSAL | Status: DC | PRN

## 2013-05-26 MED ORDER — DIPHENHYDRAMINE HCL 25 MG PO CAPS: 25.0000 mg | ORAL_CAPSULE | ORAL | Status: DC | PRN

## 2013-05-26 NOTE — Progress Notes (Signed)
Sched. Ibuprofen given

## 2013-05-26 NOTE — Anesthesia Postprocedure Evaluation (Signed)
Anesthesia Post Note  Patient: Sally Garner  Procedure(s) Performed: Procedure(s) (LRB): CESAREAN SECTION (N/A)  Anesthesia type: Epidural  Patient location: AICU  Post pain: Pain level controlled  Post assessment: Post-op Vital signs reviewed  Last Vitals:  Filed Vitals:   05/26/13 0742  BP: 111/64  Pulse: 118  Temp: 37.3 C  Resp: 20    Post vital signs: Reviewed  Level of consciousness:alert  Complications: No apparent anesthesia complications

## 2013-05-26 NOTE — Addendum Note (Signed)
Addendum created 05/26/13 9407 by Asher Muir, CRNA   Modules edited: Notes Section   Notes Section:  File: [id number]

## 2013-05-26 NOTE — Progress Notes (Signed)
Pt ambulated to MBU Rm #122 - tolerated well

## 2013-05-26 NOTE — Progress Notes (Signed)
Postpartum Note Day # 1  S:  Patient resting comfortable in bed.  Pain controlled.  Tolerating CLD. No flatus, no BM.  Lochia appropriate.  Not yet ambulating, foley in place. She denies n/v/f/c, SOB, or CP.  O: Temp:  [98.1 F (36.7 C)-101.7 F (38.7 C)] 100.1 F (37.8 C) (04/05 0600) Pulse Rate:  [104-157] 121 (04/05 0700) Resp:  [15-23] 16 (04/05 0545) BP: (90-149)/(45-108) 122/74 mmHg (04/05 0600) SpO2:  [95 %-100 %] 96 % (04/05 0700) Weight:  [103.103 kg (227 lb 4.8 oz)] 103.103 kg (227 lb 4.8 oz) (04/05 0100) Gen: A&Ox3, NAD CV: +tachycardia Resp: CTAB Abdomen: soft, ND, +BS Uterus: firm, tender, below umbilicus Incision: c/d/i, bandage on GU: Foley in place with amber urine Ext: No edema, no calf tenderness bilaterally, SCDs in place  Labs:  Results for LATRICA, CLOWERS (MRN [id number]) as of 05/26/2013 07:18  Ref. Range 05/25/2013 23:15 05/26/2013 05:05  WBC Latest Range: 4.0-10.5 K/uL 21.3 (H) 16.7 (H)  RBC Latest Range: 3.87-5.11 MIL/uL 3.60 (L) 3.29 (L)  Hemoglobin Latest Range: 12.0-15.0 g/dL 10.8 (L) 9.7 (L)  HCT Latest Range: 36.0-46.0 % 32.1 (L) 28.8 (L)  MCV Latest Range: 78.0-100.0 fL 89.2 87.5  MCH Latest Range: 26.0-34.0 pg 30.0 29.5  MCHC Latest Range: 30.0-36.0 g/dL 33.6 33.7  RDW Latest Range: 11.5-15.5 % 13.2 13.3  Platelets Latest Range: 150-400 K/uL 249 226   A/P: Pt is a 28 y.o. G1P1001 s/p primary C-section due to failure to progress POD #1  - Chorioamnionitis:  -continue IV hydration  -IV Unasyn until 24hr afebrile  -If repeat temp noted, will collect blood and urine cultures  -WBC improved as above, repeat CBC tmr -Tachycardia:  -Although worsening tachycardia was acute onset in the OR, may likely be due to chorioamnionitis.  Will hold off on further beta blockers at this time and continue to treat for infection   - Pain controlled continue current regimen -GU: UOP is adequate, fluid bolus given -GI: CLD, will advance diet as  tolerated -Prophylaxis: Lovenox to be started today @ 12pm, SCDs while in bed -Activity: encourage up to chair and ambulation today -Heme: stable as above, lochia appropriate  Sally Pupa, DO [phone] (pager) [phone] (office)

## 2013-05-26 NOTE — Addendum Note (Signed)
Addendum created 05/26/13 0144 by Genevie Ann, CRNA   Modules edited: Anesthesia Flowsheet

## 2013-05-27 ENCOUNTER — Encounter (HOSPITAL_COMMUNITY): Payer: Self-pay | Admitting: Obstetrics & Gynecology

## 2013-05-27 LAB — CBC WITH DIFFERENTIAL/PLATELET
Basophils Absolute: 0 10*3/uL (ref 0.0–0.1)
Basophils Relative: 0 % (ref 0–1)
Eosinophils Absolute: 0.2 10*3/uL (ref 0.0–0.7)
Eosinophils Relative: 2 % (ref 0–5)
HEMATOCRIT: 25.8 % — AB (ref 36.0–46.0)
HEMOGLOBIN: 8.7 g/dL — AB (ref 12.0–15.0)
Lymphocytes Relative: 21 % (ref 12–46)
Lymphs Abs: 3.1 10*3/uL (ref 0.7–4.0)
MCH: 29.8 pg (ref 26.0–34.0)
MCHC: 33.7 g/dL (ref 30.0–36.0)
MCV: 88.4 fL (ref 78.0–100.0)
MONO ABS: 1.5 10*3/uL — AB (ref 0.1–1.0)
Monocytes Relative: 10 % (ref 3–12)
Neutro Abs: 9.9 10*3/uL — ABNORMAL HIGH (ref 1.7–7.7)
Neutrophils Relative %: 67 % (ref 43–77)
Platelets: 229 10*3/uL (ref 150–400)
RBC: 2.92 MIL/uL — ABNORMAL LOW (ref 3.87–5.11)
RDW: 13.5 % (ref 11.5–15.5)
WBC: 14.7 10*3/uL — AB (ref 4.0–10.5)

## 2013-05-27 NOTE — Lactation Note (Signed)
This note was copied from the chart of Sally [place]. Lactation Consultation Note  Patient Name: Sally Garner ZYSAY'[id number] Date: 05/27/2013 Reason for consult: Follow-up assessment  Infant has breastfed x1 (10 min) + Formula fed x7 (15-74ml) for the past 24 hours; voids 5 in past 24/ 6 life; stools - 3 in past 24 hrs/ 5 life.  Mom stated she "gave up" on breastfeeding.  When asked about pumping mom stated she has only pumped x2 in past 24 hrs.  Asked mom if she planned to feed breastmilk to infant and she stated "yes".  Reviewed the milk supply/ demand and reviewed the need to pump 6-8 times per day with hands-on pumping.  At time of visit mom was sitting on side of bed with infant propped on a pillow on the bed asleep.  Reviewed SIDS prevention and the need to keep infant in crib when sleeping on a flat surface.  [place] spoke with RN about the need for SIDS prevention reinforcement with teaching.  Encouraged mom to call for assistance if needed.    Feeding Feeding Type: Formula   Consult Status Consult Status: PRN Follow-up type: In-patient    Merlene Laughter 05/27/2013, 2:59 PM

## 2013-05-27 NOTE — Progress Notes (Signed)
Sally Garner  Post Partum Day 2:S/P Primary LTCS for NRFHR  Subjective: Patient up ad lib, denies syncope or dizziness. Reports flatus, denies N/V. Pain well controlled with percocet and motrin. Feeding:  Bottle Contraceptive plan:   Declines  Objective: Blood pressure 127/87, pulse 103, temperature 97.8 F (36.6 C), temperature source Oral, resp. rate 18, height 5\' 5"  (1.651 m), weight 227 lb 4.8 oz (103.103 kg), last menstrual period 08/19/2012, SpO2 98.00%, unknown if currently breastfeeding.  Physical Exam:  General: alert, cooperative and no distress Lochia: appropriate Uterine Fundus: firm at the umbilicus Abdomen: BSx 4Q Incision: Pressure dressing CDI DVT Evaluation: No evidence of DVT seen on physical exam. Negative Homan's sign. No significant calf/ankle edema. No JP Drain  Recent Labs  05/26/13 0505 05/27/13 0624  HGB 9.7* 8.7*  HCT 28.8* 25.8*    Assessment Post Op day 2-Doing well Stable Normal Involution Bottle Feeding  Plan: Continue current care  Plan for discharge tomorrow  Sedalia Muta, CNM 05/27/2013, 2:02 PM

## 2013-05-27 NOTE — Progress Notes (Signed)
UR chart review completed.  

## 2013-05-27 NOTE — Progress Notes (Signed)
Ur chart review completed.  

## 2013-05-28 LAB — CBC
HEMATOCRIT: 24.1 % — AB (ref 36.0–46.0)
Hemoglobin: 8 g/dL — ABNORMAL LOW (ref 12.0–15.0)
MCH: 29.1 pg (ref 26.0–34.0)
MCHC: 33.2 g/dL (ref 30.0–36.0)
MCV: 87.6 fL (ref 78.0–100.0)
PLATELETS: 258 10*3/uL (ref 150–400)
RBC: 2.75 MIL/uL — AB (ref 3.87–5.11)
RDW: 13.5 % (ref 11.5–15.5)
WBC: 11.3 10*3/uL — AB (ref 4.0–10.5)

## 2013-05-28 MED ORDER — IBUPROFEN 600 MG PO TABS: 600.0000 mg | ORAL_TABLET | Freq: Four times a day (QID) | ORAL | Status: DC | PRN

## 2013-05-28 MED ORDER — OXYCODONE-ACETAMINOPHEN 5-325 MG PO TABS: 1.0000 | ORAL_TABLET | Freq: Four times a day (QID) | ORAL | Status: DC | PRN

## 2013-05-28 NOTE — Discharge Summary (Signed)
Cesarean Section Delivery Discharge Summary  Sally Garner  DOB:    1985/06/13 MRN:    [id number] CSN:    [id number]  Date of admission:                  05/24/2013  Date of discharge:                   05/28/2013  Procedures this admission:  Date of Delivery: 05/25/2013  Newborn Data:  Live born female  Birth Weight: 8 lb 10 oz (3912 g) APGAR: 9, 9  Home with mother. Name: Sa'Niyah Circumcision Plan: N/A  History of Present Illness:  [name] is a 28 y.o. female, G1P1001, who presents at [redacted]w[redacted]d weeks gestation. The patient has been followed at the [facility] and Gynecology division of [facility].    Her pregnancy has been complicated by:  Patient Active Problem List   Diagnosis Date Noted  . Status post primary low transverse cesarean section--FTP, NRFHR 05/26/2013  . Anemia 05/24/2013  . Smoker 05/24/2013  . Fibroid--small x 1, noted on early [place], not noted on later scans 05/24/2013  . Obesity, unspecified--BMI 37.1 05/24/2013  . Hx of primary hypertension--2-3 years ago 05/24/2013   none.  Hospital course:  The patient was admitted for IOL d/t postdates.   Her postpartum course was not complicated. Patient was placed on Lovenox per [facility] protocol. She was discharged to home on postpartum day 3 doing well.  Feeding:  bottle  Contraception:  condoms  Discharge hemoglobin:  Hemoglobin  Date Value Ref Range Status  05/28/2013 8.0* 12.0 - 15.0 g/dL Final     HCT  Date Value Ref Range Status  05/28/2013 24.1* 36.0 - 46.0 % Final    Discharge Physical Exam:   General: alert, cooperative and no distress Lochia: appropriate Uterine Fundus: firm, Above Umbilius--encouraged to urinate regularly-last urination at 3am Incision: [place] CDI-discussed taking off in 2-3days DVT Evaluation: No evidence of DVT seen on physical exam. Negative Homan's sign. Calf/Ankle edema is present.  Intrapartum  Procedures: cesarean: low cervical, transverse Postpartum Procedures: none Complications-Operative and Postpartum: none  Discharge Diagnoses: Post-date pregnancy, delivered  Discharge Information:  Activity:           pelvic rest Diet:                routine Medications: Ibuprofen and Percocet Condition:      stable Instructions: Incision care  Care After Cesarean Delivery  Refer to this sheet in the next few weeks. These instructions provide you with information on caring for yourself after your procedure. Your caregiver may also give you specific instructions. Your treatment has been planned according to current medical practices, but problems sometimes occur. Call your caregiver if you have any problems or questions after you go home. HOME CARE INSTRUCTIONS  Only take over-the-counter or prescription medicines as directed by your caregiver.  Do not drink alcohol, especially if you are breastfeeding or taking medicine to relieve pain.  Do not chew or smoke tobacco.  Continue to use good perineal care. Good perineal care includes:  Wiping your perineum from front to back.  Keeping your perineum clean.  Check your cut (incision) daily for increased redness, drainage, swelling, or separation of skin.  Clean your incision gently with soap and water every day, and then pat it dry. If your caregiver says it is okay, leave the incision uncovered. Use a bandage (dressing) if the incision is draining fluid  or appears irritated. If the adhesive strips across the incision do not fall off within 7 days, carefully peel them off.  Hug a pillow when coughing or sneezing until your incision is healed. This helps to relieve pain.  Do not use tampons or douche until your caregiver says it is okay.  Shower, wash your hair, and take tub baths as directed by your caregiver.  Wear a well-fitting bra that provides breast support.  Limit wearing support panties or control-top hose.  Drink  enough fluids to keep your urine clear or pale yellow.  Eat high-fiber foods such as whole grain cereals and breads, brown rice, beans, and fresh fruits and vegetables every day. These foods may help prevent or relieve constipation.  Resume activities such as climbing stairs, driving, lifting, exercising, or traveling as directed by your caregiver.  Talk to your caregiver about resuming sexual activities. This is dependent upon your risk of infection, your rate of healing, and your comfort and desire to resume sexual activity.  Try to have someone help you with your household activities and your newborn for at least a few days after you leave the hospital.  Rest as much as possible. Try to rest or take a nap when your newborn is sleeping.  Increase your activities gradually.  Keep all of your scheduled postpartum appointments. It is very important to keep your scheduled follow-up appointments. At these appointments, your caregiver will be checking to make sure that you are healing physically and emotionally. SEEK MEDICAL CARE IF:   You are passing large clots from your vagina. Save any clots to show your caregiver.  You have a foul smelling discharge from your vagina.  You have trouble urinating.  You are urinating frequently.  You have pain when you urinate.  You have a change in your bowel movements.  You have increasing redness, pain, or swelling near your incision.  You have pus draining from your incision.  Your incision is separating.  You have painful, hard, or reddened breasts.  You have a severe headache.  You have blurred vision or see spots.  You feel sad or depressed.  You have thoughts of hurting yourself or your newborn.  You have questions about your care, the care of your newborn, or medicines.  You are dizzy or lightheaded.  You have a rash.  You have pain, redness, or swelling at the site of the removed intravenous access (IV) tube.  You have  nausea or vomiting.  You stopped breastfeeding and have not had a menstrual period within 12 weeks of stopping.  You are not breastfeeding and have not had a menstrual period within 12 weeks of delivery.  You have a fever. SEEK IMMEDIATE MEDICAL CARE IF:  You have persistent pain.  You have chest pain.  You have shortness of breath.  You faint.  You have leg pain.  You have stomach pain.  Your vaginal bleeding saturates 2 or more sanitary pads in 1 hour. MAKE SURE YOU:   Understand these instructions.  Will watch your condition.  Will get help right away if you are not doing well or get worse. Document Released: 10/30/2001 Document Revised: 11/02/2011 Document Reviewed: 10/05/2011 [facility] Patient Information 2014 [place].   Postpartum Depression and Baby Blues  The postpartum period begins right after the birth of a baby. During this time, there is often a great amount of joy and excitement. It is also a time of considerable changes in the life of the parent(s). Regardless  of how many times a mother gives birth, each child brings new challenges and dynamics to the family. It is not unusual to have feelings of excitement accompanied by confusing shifts in moods, emotions, and thoughts. All mothers are at risk of developing postpartum depression or the "baby blues." These mood changes can occur right after giving birth, or they may occur many months after giving birth. The baby blues or postpartum depression can be mild or severe. Additionally, postpartum depression can resolve rather quickly, or it can be a long-term condition. CAUSES Elevated hormones and their rapid decline are thought to be a main cause of postpartum depression and the baby blues. There are a number of hormones that radically change during and after pregnancy. Estrogen and progesterone usually decrease immediately after delivering your baby. The level of thyroid hormone and various cortisol steroids  also rapidly drop. Other factors that play a major role in these changes include major life events and genetics.  RISK FACTORS If you have any of the following risks for the baby blues or postpartum depression, know what symptoms to watch out for during the postpartum period. Risk factors that may increase the likelihood of getting the baby blues or postpartum depression include:  Havinga personal or family history of depression.  Having depression while being pregnant.  Having premenstrual or oral contraceptive-associated mood issues.  Having exceptional life stress.  Having marital conflict.  Lacking a social support network.  Having a baby with special needs.  Having health problems such as diabetes. SYMPTOMS Baby blues symptoms include:  Brief fluctuations in mood, such as going from extreme happiness to sadness.  Decreased concentration.  Difficulty sleeping.  Crying spells, tearfulness.  Irritability.  Anxiety. Postpartum depression symptoms typically begin within the first month after giving birth. These symptoms include:  Difficulty sleeping or excessive sleepiness.  Marked weight loss.  Agitation.  Feelings of worthlessness.  Lack of interest in activity or food. Postpartum psychosis is a very concerning condition and can be dangerous. Fortunately, it is rare. Displaying any of the following symptoms is cause for immediate medical attention. Postpartum psychosis symptoms include:  Hallucinations and delusions.  Bizarre or disorganized behavior.  Confusion or disorientation. DIAGNOSIS  A diagnosis is made by an evaluation of your symptoms. There are no medical or lab tests that lead to a diagnosis, but there are various questionnaires that a caregiver may use to identify those with the baby blues, postpartum depression, or psychosis. Often times, a screening tool called the [place] Postnatal Depression Scale is used to diagnose depression in the  postpartum period.  TREATMENT The baby blues usually goes away on its own in 1 to 2 weeks. Social support is often all that is needed. You should be encouraged to get adequate sleep and rest. Occasionally, you may be given medicines to help you sleep.  Postpartum depression requires treatment as it can last several months or longer if it is not treated. Treatment may include individual or group therapy, medicine, or both to address any social, physiological, and psychological factors that may play a role in the depression. Regular exercise, a healthy diet, rest, and social support may also be strongly recommended.  Postpartum psychosis is more serious and needs treatment right away. Hospitalization is often needed. HOME CARE INSTRUCTIONS  Get as much rest as you can. Nap when the baby sleeps.  Exercise regularly. Some women find yoga and walking to be beneficial.  Eat a balanced and nourishing diet.  Do little things that you  enjoy. Have a cup of tea, take a bubble bath, read your favorite magazine, or listen to your favorite music.  Avoid alcohol.  Ask for help with household chores, cooking, grocery shopping, or running errands as needed. Do not try to do everything.  Talk to people close to you about how you are feeling. Get support from your partner, family members, friends, or other new moms.  Try to stay positive in how you think. Think about the things you are grateful for.  Do not spend a lot of time alone.  Only take medicine as directed by your caregiver.  Keep all your postpartum appointments.  Let your caregiver know if you have any concerns. SEEK MEDICAL CARE IF: You are having a reaction or problems with your medicine. SEEK IMMEDIATE MEDICAL CARE IF:  You have suicidal feelings.  You feel you may harm the baby or someone else. Document Released: 11/12/2003 Document Revised: 05/02/2011 Document Reviewed: 12/14/2010 [facility] Patient Information 2014 [place],  [place].  Discharge to: home  Follow-up Information   Follow up with [facility] In 6 weeks. (Please call if you have any questions or concerns prior to your visit. )    Specialty:  Obstetrics and Gynecology   Contact information:   [address] [id number] [phone]       Sedalia Muta, CNM, MSN 05/28/2013

## 2013-05-28 NOTE — Discharge Instructions (Signed)
Contraception Choices Birth control (contraception) is the use of any methods or devices to stop pregnancy from happening. Below are some methods to help avoid pregnancy. HORMONAL BIRTH CONTROL  A small tube put under the skin of the upper arm (implant). The tube can stay in place for 3 years. The implant must be taken out after 3 years.  Shots given every 3 months.  Pills taken every day.  Patches that are changed once a week.  A ring put into the vagina (vaginal ring). The ring is left in place for 3 weeks and removed for 1 week. Then, a new ring is put in the vagina.  Emergency birth control pills taken after unprotected sex (intercourse). BARRIER BIRTH CONTROL   A thin covering worn on the penis (female condom) during sex.  A soft, loose covering put into the vagina (female condom) before sex.  A rubber bowl that sits over the cervix (diaphragm). The bowl must be made for you. The bowl is put into the vagina before sex. The bowl is left in place for 6 to 8 hours after sex.  A small, soft cup that fits over the cervix (cervical cap). The cup must be made for you. The cup can be left in place for 48 hours after sex.  A sponge that is put into the vagina before sex.  A chemical that kills or stops sperm from getting into the cervix and uterus (spermicide). The chemical may be a cream, jelly, foam, or pill. INTRAUTERINE (IUD) BIRTH CONTROL   IUD birth control is a small, T-shaped piece of plastic. The plastic is put inside the uterus. There are 2 types of IUD:  Copper IUD. The IUD is covered in copper wire. The copper makes a fluid that kills sperm. It can stay in place for 10 years.  Hormone IUD. The hormone stops pregnancy from happening. It can stay in place for 5 years. PERMANENT METHODS  When the woman has her fallopian tubes sealed, tied, or blocked during surgery. This stops the egg from traveling to the uterus.  The doctor places a small coil or insert into each fallopian  tube. This causes scar tissue to form and blocks the fallopian tubes.  When the female has the tubes that carry sperm tied off (vasectomy). NATURAL FAMILY PLANNING BIRTH CONTROL   Natural family planning means not having sex or using barrier birth control on the days the woman could become pregnant.  Use a calendar to keep track of the length of each period and know the days she can get pregnant.  Avoid sex during ovulation.  Use a thermometer to measure body temperature. Also watch for symptoms of ovulation.  Time sex to be after the woman has ovulated. Use condoms to help protect yourself against sexually transmitted infections (STIs). Do this no matter what type of birth control you use. Talk to your doctor about which type of birth control is best for you. Document Released: 12/05/2008 Document Revised: 10/10/2012 Document Reviewed: 08/29/2012 [facility] Patient Information 2014 [place].  Postpartum Care After Cesarean Delivery After you deliver your newborn (postpartum period), the usual stay in the hospital is 24 72 hours. If there were problems with your labor or delivery, or if you have other medical problems, you might be in the hospital longer.  While you are in the hospital, you will receive help and instructions on how to care for yourself and your newborn during the postpartum period.  While you are in the hospital:  It is normal for you to have pain or discomfort from the incision in your abdomen. Be sure to tell your nurses when you are having pain, where the pain is located, and what makes the pain worse.  If you are breastfeeding, you may feel uncomfortable contractions of your uterus for a couple of weeks. This is normal. The contractions help your uterus get back to normal size.  It is normal to have some bleeding after delivery.  For the first 1 3 days after delivery, the flow is red and the amount may be similar to a period.  It is common for the flow to start  and stop.  In the first few days, you may pass some small clots. Let your nurses know if you begin to pass large clots or your flow increases.  Do not  flush blood clots down the toilet before having the nurse look at them.  During the next 3 10 days after delivery, your flow should become more watery and pink or brown-tinged in color.  Ten to fourteen days after delivery, your flow should be a small amount of yellowish-white discharge.  The amount of your flow will decrease over the first few weeks after delivery. Your flow may stop in 6 8 weeks. Most women have had their flow stop by 12 weeks after delivery.  You should change your sanitary pads frequently.  Wash your hands thoroughly with soap and water for at least 20 seconds after changing pads, using the toilet, or before holding or feeding your newborn.  Your intravenous (IV) tubing will be removed when you are drinking enough fluids.  The urine drainage tube (urinary catheter) that was inserted before delivery may be removed within 6 8 hours after delivery or when feeling returns to your legs. You should feel like you need to empty your bladder within the first 6 8 hours after the catheter has been removed.  In case you become weak, lightheaded, or faint, call your nurse before you get out of bed for the first time and before you take a shower for the first time.  Within the first few days after delivery, your breasts may begin to feel tender and full. This is called engorgement. Breast tenderness usually goes away within 48 72 hours after engorgement occurs. You may also notice milk leaking from your breasts. If you are not breastfeeding, do not stimulate your breasts. Breast stimulation can make your breasts produce more milk.  Spending as much time as possible with your newborn is very important. During this time, you and your newborn can feel close and get to know each other. Having your newborn stay in your room (rooming in) will  help to strengthen the bond with your newborn. It will give you time to get to know your newborn and become comfortable caring for your newborn.  Your hormones change after delivery. Sometimes the hormone changes can temporarily cause you to feel sad or tearful. These feelings should not last more than a few days. If these feelings last longer than that, you should talk to your caregiver.  If desired, talk to your caregiver about methods of family planning or contraception.  Talk to your caregiver about immunizations. Your caregiver may want you to have the following immunizations before leaving the hospital:  Tetanus, diphtheria, and pertussis (Tdap) or tetanus and diphtheria (Td) immunization. It is very important that you and your family (including grandparents) or others caring for your newborn are up-to-date with the Tdap or  Td immunizations. The Tdap or Td immunization can help protect your newborn from getting ill.  Rubella immunization.  Varicella (chickenpox) immunization.  Influenza immunization. You should receive this annual immunization if you did not receive the immunization during your pregnancy. Document Released: 11/02/2011 Document Reviewed: 11/02/2011 [facility] Patient Information 2014 [place], [place].  Postpartum Depression and Baby Blues The postpartum period begins right after the birth of a baby. During this time, there is often a great amount of joy and excitement. It is also a time of considerable changes in the life of the parent(s). Regardless of how many times a mother gives birth, each child brings new challenges and dynamics to the family. It is not unusual to have feelings of excitement accompanied by confusing shifts in moods, emotions, and thoughts. All mothers are at risk of developing postpartum depression or the "baby blues." These mood changes can occur right after giving birth, or they may occur many months after giving birth. The baby blues or postpartum  depression can be mild or severe. Additionally, postpartum depression can resolve rather quickly, or it can be a long-term condition. CAUSES Elevated hormones and their rapid decline are thought to be a main cause of postpartum depression and the baby blues. There are a number of hormones that radically change during and after pregnancy. Estrogen and progesterone usually decrease immediately after delivering your baby. The level of thyroid hormone and various cortisol steroids also rapidly drop. Other factors that play a major role in these changes include major life events and genetics.  RISK FACTORS If you have any of the following risks for the baby blues or postpartum depression, know what symptoms to watch out for during the postpartum period. Risk factors that may increase the likelihood of getting the baby blues or postpartum depression include:  Havinga personal or family history of depression.  Having depression while being pregnant.  Having premenstrual or oral contraceptive-associated mood issues.  Having exceptional life stress.  Having marital conflict.  Lacking a social support network.  Having a baby with special needs.  Having health problems such as diabetes. SYMPTOMS Baby blues symptoms include:  Brief fluctuations in mood, such as going from extreme happiness to sadness.  Decreased concentration.  Difficulty sleeping.  Crying spells, tearfulness.  Irritability.  Anxiety. Postpartum depression symptoms typically begin within the first month after giving birth. These symptoms include:  Difficulty sleeping or excessive sleepiness.  Marked weight loss.  Agitation.  Feelings of worthlessness.  Lack of interest in activity or food. Postpartum psychosis is a very concerning condition and can be dangerous. Fortunately, it is rare. Displaying any of the following symptoms is cause for immediate medical attention. Postpartum psychosis symptoms  include:  Hallucinations and delusions.  Bizarre or disorganized behavior.  Confusion or disorientation. DIAGNOSIS  A diagnosis is made by an evaluation of your symptoms. There are no medical or lab tests that lead to a diagnosis, but there are various questionnaires that a caregiver may use to identify those with the baby blues, postpartum depression, or psychosis. Often times, a screening tool called the [place] Postnatal Depression Scale is used to diagnose depression in the postpartum period.  TREATMENT The baby blues usually goes away on its own in 1 to 2 weeks. Social support is often all that is needed. You should be encouraged to get adequate sleep and rest. Occasionally, you may be given medicines to help you sleep.  Postpartum depression requires treatment as it can last several months or longer if it is  not treated. Treatment may include individual or group therapy, medicine, or both to address any social, physiological, and psychological factors that may play a role in the depression. Regular exercise, a healthy diet, rest, and social support may also be strongly recommended.  Postpartum psychosis is more serious and needs treatment right away. Hospitalization is often needed. HOME CARE INSTRUCTIONS  Get as much rest as you can. Nap when the baby sleeps.  Exercise regularly. Some women find yoga and walking to be beneficial.  Eat a balanced and nourishing diet.  Do little things that you enjoy. Have a cup of tea, take a bubble bath, read your favorite magazine, or listen to your favorite music.  Avoid alcohol.  Ask for help with household chores, cooking, grocery shopping, or running errands as needed. Do not try to do everything.  Talk to people close to you about how you are feeling. Get support from your partner, family members, friends, or other new moms.  Try to stay positive in how you think. Think about the things you are grateful for.  Do not spend a lot of time  alone.  Only take medicine as directed by your caregiver.  Keep all your postpartum appointments.  Let your caregiver know if you have any concerns. SEEK MEDICAL CARE IF: You are having a reaction or problems with your medicine. SEEK IMMEDIATE MEDICAL CARE IF:  You have suicidal feelings.  You feel you may harm the baby or someone else. Document Released: 11/12/2003 Document Revised: 05/02/2011 Document Reviewed: 11/19/2012 [facility] Patient Information 2014 [place], [place].

## 2013-06-05 ENCOUNTER — Encounter (HOSPITAL_COMMUNITY): Payer: Self-pay | Admitting: *Deleted

## 2013-06-05 ENCOUNTER — Inpatient Hospital Stay (HOSPITAL_COMMUNITY)
Admission: AD | Admit: 2013-06-05 | Discharge: 2013-06-05 | Disposition: A | Discharge status: Home / Self Care | Payer: Medicaid Other | Source: Ambulatory Visit | Attending: Obstetrics and Gynecology | Admitting: Obstetrics and Gynecology

## 2013-06-05 DIAGNOSIS — O9989 Other specified diseases and conditions complicating pregnancy, childbirth and the puerperium: Principal | ICD-10-CM

## 2013-06-05 DIAGNOSIS — K219 Gastro-esophageal reflux disease without esophagitis: Secondary | ICD-10-CM | POA: Insufficient documentation

## 2013-06-05 DIAGNOSIS — Z87891 Personal history of nicotine dependence: Secondary | ICD-10-CM | POA: Insufficient documentation

## 2013-06-05 DIAGNOSIS — R030 Elevated blood-pressure reading, without diagnosis of hypertension: Secondary | ICD-10-CM | POA: Insufficient documentation

## 2013-06-05 DIAGNOSIS — R0602 Shortness of breath: Secondary | ICD-10-CM | POA: Insufficient documentation

## 2013-06-05 DIAGNOSIS — R079 Chest pain, unspecified: Secondary | ICD-10-CM | POA: Insufficient documentation

## 2013-06-05 DIAGNOSIS — R51 Headache: Secondary | ICD-10-CM | POA: Insufficient documentation

## 2013-06-05 DIAGNOSIS — O99893 Other specified diseases and conditions complicating puerperium: Secondary | ICD-10-CM | POA: Insufficient documentation

## 2013-06-05 LAB — COMPREHENSIVE METABOLIC PANEL
ALT: 9 U/L (ref 0–35)
AST: 12 U/L (ref 0–37)
Albumin: 2.7 g/dL — ABNORMAL LOW (ref 3.5–5.2)
Alkaline Phosphatase: 75 U/L (ref 39–117)
BILIRUBIN TOTAL: 0.2 mg/dL — AB (ref 0.3–1.2)
BUN: 9 mg/dL (ref 6–23)
CO2: 22 mEq/L (ref 19–32)
Calcium: 8.6 mg/dL (ref 8.4–10.5)
Chloride: 105 mEq/L (ref 96–112)
Creatinine, Ser: 0.7 mg/dL (ref 0.50–1.10)
GFR calc non Af Amer: >90 mL/min (ref >90)
GLUCOSE: 81 mg/dL (ref 70–99)
Potassium: 4.2 mEq/L (ref 3.7–5.3)
Sodium: 141 mEq/L (ref 137–147)
Total Protein: 6.6 g/dL (ref 6.0–8.3)

## 2013-06-05 LAB — URINALYSIS, ROUTINE W REFLEX MICROSCOPIC
BILIRUBIN URINE: NEGATIVE
GLUCOSE, UA: NEGATIVE mg/dL
Ketones, ur: NEGATIVE mg/dL
Leukocytes, UA: NEGATIVE
Nitrite: NEGATIVE
Protein, ur: NEGATIVE mg/dL
SPECIFIC GRAVITY, URINE: 1.01 (ref 1.005–1.030)
UROBILINOGEN UA: 0.2 mg/dL (ref 0.0–1.0)
pH: 6 (ref 5.0–8.0)

## 2013-06-05 LAB — CBC
HEMATOCRIT: 23.9 % — AB (ref 36.0–46.0)
HEMOGLOBIN: 7.8 g/dL — AB (ref 12.0–15.0)
MCH: 28.8 pg (ref 26.0–34.0)
MCHC: 32.6 g/dL (ref 30.0–36.0)
MCV: 88.2 fL (ref 78.0–100.0)
Platelets: 389 10*3/uL (ref 150–400)
RBC: 2.71 MIL/uL — AB (ref 3.87–5.11)
RDW: 13.2 % (ref 11.5–15.5)
WBC: 11.4 10*3/uL — ABNORMAL HIGH (ref 4.0–10.5)

## 2013-06-05 LAB — PROTEIN / CREATININE RATIO, URINE
Creatinine, Urine: 84.26 mg/dL
Protein Creatinine Ratio: 0.08 (ref 0.00–0.15)
Total Protein, Urine: 6.9 mg/dL

## 2013-06-05 LAB — URIC ACID: URIC ACID, SERUM: 6 mg/dL (ref 2.4–7.0)

## 2013-06-05 LAB — URINE MICROSCOPIC-ADD ON

## 2013-06-05 LAB — LACTATE DEHYDROGENASE: LDH: 265 U/L — ABNORMAL HIGH (ref 94–250)

## 2013-06-05 NOTE — MAU Note (Addendum)
Had a c/s on 04/04: wasn't dilating and ? FH.  Denies any problems with the preg.SOB - mainly when laying down. Also having chest pain, comes and goes, both started a few days ago.  o2 sat in triage 99-100,remains up when talking (able to speak in full sentences) closed mouth breathing.   HA started a couple days ago (usually goes away with pain meds), denies increase in swellling, no epigastric pain or visual changes.  Breast and bottle feeding- going well.  Baby is doing good.

## 2013-06-05 NOTE — Discharge Instructions (Signed)
Nifedipine extended-release tablets What is this medicine? NIFEDIPINE (nye FED i peen) is a calcium-channel blocker. It affects the amount of calcium found in your heart and muscle cells. This relaxes your blood vessels, which can reduce the amount of work the heart has to do. This medicine is used to treat high blood pressure and chest pain caused by angina. This medicine may be used for other purposes; ask your health care provider or pharmacist if you have questions. COMMON BRAND NAME(S): Adalat CC, Afeditab CR, Nifediac CC, Nifedical XL, Procardia XL What should I tell my health care provider before I take this medicine? They need to know if you have any of these conditions: -heart problems, low blood pressure, slow or irregular heartbeat -kidney disease -liver disease -previous heart attack -stomach or intestine problems -an unusual or allergic reaction to nifedipine, other medicines, foods, dyes, or preservatives -pregnant or trying to get pregnant -breast-feeding How should I use this medicine? Take this medicine by mouth with a glass of water. Follow the directions on the prescription label. Do not cut, crush or chew. Take your doses at regular intervals. Do not take your medicine more often then directed. Do not suddenly stop taking this medicine. Your doctor will tell you how much medicine to take. If your doctor wants you to stop the medicine, the dose will be slowly lowered over time to avoid any side effects. Talk to your pediatrician regarding the use of this medicine in children. Special care may be needed. Overdosage: If you think you have taken too much of this medicine contact a poison control center or emergency room at once. NOTE: This medicine is only for you. Do not share this medicine with others. What if I miss a dose? If you miss a dose, take it as soon as you can. If it is almost time for your next dose, take only that dose. Do not take double or extra doses. What may  interact with this medicine? Do not take this medicine with any of the following medications: -certain medicines for seizures like carbamazepine, phenobarbital, phenytoin -rifabutin -rifampin -rifapentine -St. John's Wort This medicine may also interact with the following medications: -antiviral medicines for HIV or AIDS -certain medicines for blood pressure -certain medicines for diabetes -certain medicines for erectile dysfunction -certain medicines for fungal infections like ketoconazole, fluconazole, and itraconazole -certain medicines for irregular heart beat like flecainide and quinidine -certain medicines that treat or prevent blood clots like warfarin -clarithromycin -digoxin -dolasetron -erythromycin -fluoxetine -grapefruit juice -local or general anesthetics -nefazodone -orlistat -quinupristin; dalfopristin -sirolimus -stomach acid blockers like cimetidine, ranitidine, omeprazole, or pantoprazole -tacrolimus -valproic acid This list may not describe all possible interactions. Give your health care provider a list of all the medicines, herbs, non-prescription drugs, or dietary supplements you use. Also tell them if you smoke, drink alcohol, or use illegal drugs. Some items may interact with your medicine. What should I watch for while using this medicine? Visit your doctor or health care professional for regular check ups. Check your blood pressure and pulse rate regularly. Ask your doctor or health care professional what your blood pressure and pulse rate should be and when you should contact him or her. You may get drowsy or dizzy. Do not drive, use machinery, or do anything that needs mental alertness until you know how this medicine affects you. Do not stand or sit up quickly, especially if you are an older patient. This reduces the risk of dizzy or fainting spells. Alcohol may interfere  with the effect of this medicine. Avoid alcoholic drinks. If you are taking  Procardia XL, you may notice the empty shell of the tablet in your stool. What side effects may I notice from receiving this medicine? Side effects that you should report to your doctor or health care professional as soon as possible: -blood in the urine -difficulty breathing -fast heartbeat, palpitations, irregular heartbeat, chest pain -redness, blistering, peeling or loosening of the skin, including inside the mouth -reduced amount of urine passed -skin rash -swelling of the legs and ankles Side effects that usually do not require medical attention (report to your doctor or health care professional if they continue or are bothersome): -constipation -facial flushing -headache -weakness or tiredness This list may not describe all possible side effects. Call your doctor for medical advice about side effects. You may report side effects to FDA at 1-800-FDA-1088. Where should I keep my medicine? Keep out of the reach of children. Store at room temperature below 30 degrees C (86 degrees F). Protect from moisture and humidity. Keep container tightly closed. Throw away any unused medicine after the expiration date. NOTE: This sheet is a summary. It may not cover all possible information. If you have questions about this medicine, talk to your doctor, pharmacist, or health care provider.  2014, Elsevier/Gold Standard. (2009-12-08 13:03:40)

## 2013-06-05 NOTE — MAU Provider Note (Signed)
History   28 y.o. G1P1001 S/P Primary C/S on 05/25/13 for FTP, NRFHR presents with elevated b/p, HA, SOB, chest pain, and edema.  Patient reports that she has had a HA, unrelieved with ibuprofen, for 3 days and a mild one (3/10) today that she contributes to not eating.  Patient reports SOB noted when laying down and relieved with position change. Patient also reports chest pain that is intermittent and presents like "sharp stabbing" pain at hs.  Patient denies history of anxiety, but does admits that she did not have symptoms until she stopped sleeping with infant in her room.  Patient does report history of hypertension prior to pregnancy, but no problems during the pregnancy.  Patient currently denies epigastric pain, visual changes, and states edema is better since discharge.    Patient Active Problem List   Diagnosis Date Noted  . Status post primary low transverse cesarean section--FTP, NRFHR 05/26/2013  . Anemia 05/24/2013  . Smoker 05/24/2013  . Fibroid--small x 1, noted on early [place], not noted on later scans 05/24/2013  . Obesity, unspecified--BMI 37.1 05/24/2013  . Hx of primary hypertension--2-3 years ago 05/24/2013    Chief Complaint  Patient presents with  . Chest Pain  . Shortness of Breath   HPI  OB History   Grav Para Term Preterm Abortions TAB SAB Ect Mult Living   1 1 1       1       Past Medical History  Diagnosis Date  . Hypertension   . Eczema   . Chlamydia   . Anemia   . GERD (gastroesophageal reflux disease)   . Fibroid     Past Surgical History  Procedure Laterality Date  . No past surgeries    . Cesarean section N/A 05/25/2013    Procedure: CESAREAN SECTION;  Surgeon: Annalee Genta, DO;  Location: [facility];  Service: Obstetrics;  Laterality: N/A;    Family History  Problem Relation Age of Onset  . Hypertension Maternal Aunt   . Hypertension Maternal Uncle   . Birth defects Brother     polydactyl    History  Substance Use Topics  . Smoking  status: Former Smoker -- 0.50 packs/day    Types: Cigarettes  . Smokeless tobacco: Never Used  . Alcohol Use: No     Comment: occ    Allergies: No Known Allergies  Prescriptions prior to admission  Medication Sig Dispense Refill  . ibuprofen (ADVIL,MOTRIN) 600 MG tablet Take 1 tablet (600 mg total) by mouth every 6 (six) hours as needed for mild pain.  30 tablet  1  . oxyCODONE-acetaminophen (PERCOCET/ROXICET) 5-325 MG per tablet Take 1-2 tablets by mouth every 6 (six) hours as needed for severe pain (moderate - severe pain).  30 tablet  0    ROS See HPI Above Physical Exam   Blood pressure 158/99, pulse 75, temperature 98.8 F (37.1 C), temperature source Oral, resp. rate 16, SpO2 100.00%, unknown if currently breastfeeding.  Physical Exam  Constitutional: She appears well-developed and well-nourished. She is cooperative.  Cardiovascular: Normal rate, regular rhythm and normal heart sounds.   Respiratory: Effort normal and breath sounds normal.  GI: Soft. Bowel sounds are normal.  Musculoskeletal: Normal range of motion.  Neurological: She is alert.   Results for orders placed during the hospital encounter of 06/05/13 (from the past 24 hour(s))  CBC     Status: Abnormal   Collection Time    06/05/13 12:09 PM  Result Value Ref Range   WBC 11.4 (*) 4.0 - 10.5 K/uL   RBC 2.71 (*) 3.87 - 5.11 MIL/uL   Hemoglobin 7.8 (*) 12.0 - 15.0 g/dL   HCT 23.9 (*) 36.0 - 46.0 %   MCV 88.2  78.0 - 100.0 fL   MCH 28.8  26.0 - 34.0 pg   MCHC 32.6  30.0 - 36.0 g/dL   RDW 13.2  11.5 - 15.5 %   Platelets 389  150 - 400 K/uL  COMPREHENSIVE METABOLIC PANEL     Status: Abnormal   Collection Time    06/05/13 12:09 PM      Result Value Ref Range   Sodium 141  137 - 147 mEq/L   Potassium 4.2  3.7 - 5.3 mEq/L   Chloride 105  96 - 112 mEq/L   CO2 22  19 - 32 mEq/L   Glucose, Bld 81  70 - 99 mg/dL   BUN 9  6 - 23 mg/dL   Creatinine, Ser 0.70  0.50 - 1.10 mg/dL   Calcium 8.6  8.4 - 10.5  mg/dL   Total Protein 6.6  6.0 - 8.3 g/dL   Albumin 2.7 (*) 3.5 - 5.2 g/dL   AST 12  0 - 37 U/L   ALT 9  0 - 35 U/L   Alkaline Phosphatase 75  39 - 117 U/L   Total Bilirubin 0.2 (*) 0.3 - 1.2 mg/dL   GFR calc non Af Amer >90  >90 mL/min   GFR calc Af Amer >90  >90 mL/min  URIC ACID     Status: None   Collection Time    06/05/13 12:09 PM      Result Value Ref Range   Uric Acid, Serum 6.0  2.4 - 7.0 mg/dL  LACTATE DEHYDROGENASE     Status: Abnormal   Collection Time    06/05/13 12:09 PM      Result Value Ref Range   LDH 265 (*) 94 - 250 U/L  PROTEIN / CREATININE RATIO, URINE     Status: None   Collection Time    06/05/13 12:42 PM      Result Value Ref Range   Creatinine, Urine 84.26     Total Protein, Urine 6.9     PROTEIN CREATININE RATIO 0.08  0.00 - 0.15  URINALYSIS, ROUTINE W REFLEX MICROSCOPIC     Status: Abnormal   Collection Time    06/05/13 12:42 PM      Result Value Ref Range   Color, Urine YELLOW  YELLOW   APPearance CLEAR  CLEAR   Specific Gravity, Urine 1.010  1.005 - 1.030   pH 6.0  5.0 - 8.0   Glucose, UA NEGATIVE  NEGATIVE mg/dL   Hgb urine dipstick SMALL (*) NEGATIVE   Bilirubin Urine NEGATIVE  NEGATIVE   Ketones, ur NEGATIVE  NEGATIVE mg/dL   Protein, ur NEGATIVE  NEGATIVE mg/dL   Urobilinogen, UA 0.2  0.0 - 1.0 mg/dL   Nitrite NEGATIVE  NEGATIVE   Leukocytes, UA NEGATIVE  NEGATIVE  URINE MICROSCOPIC-ADD ON     Status: None   Collection Time    06/05/13 12:42 PM      Result Value Ref Range   Squamous Epithelial / LPF RARE  RARE   WBC, UA 0-2  <3 WBC/hpf   RBC / HPF 0-2  <3 RBC/hpf   Bacteria, UA RARE  RARE    ED Course  Assessment: PP Day 11 S/P Primary C/S H/O Hypertension  Breastfeeding  Plan: Labs: CBC, CMP, UA, LDH, P/C Ratio--Results as above Serial B/P-Elevated O2-WNL  Dr. Chauncey Cruel. Rivard consulted with and discussed plan as stated below:  Procardia 30mg  XL daily B/P check at [facility] on 06/07/2013 Call if you have any questions or concerns  prior to your visit.  Sedalia Muta CNM, MSN 06/05/2013 1:51 PM

## 2013-07-12 ENCOUNTER — Encounter: Payer: Self-pay | Admitting: Cardiovascular Disease

## 2013-07-12 ENCOUNTER — Ambulatory Visit (INDEPENDENT_AMBULATORY_CARE_PROVIDER_SITE_OTHER): Payer: Medicaid Other | Admitting: Cardiovascular Disease

## 2013-07-12 VITALS — BP 122/86 | HR 84 | Resp 20 | Ht 65.0 in | Wt 209.0 lb

## 2013-07-12 DIAGNOSIS — I319 Disease of pericardium, unspecified: Secondary | ICD-10-CM

## 2013-07-12 DIAGNOSIS — R071 Chest pain on breathing: Secondary | ICD-10-CM

## 2013-07-12 DIAGNOSIS — R0781 Pleurodynia: Secondary | ICD-10-CM | POA: Insufficient documentation

## 2013-07-12 DIAGNOSIS — R079 Chest pain, unspecified: Secondary | ICD-10-CM

## 2013-07-12 LAB — SEDIMENTATION RATE: Sed Rate: 44 mm/h — ABNORMAL HIGH (ref 0–22)

## 2013-07-12 LAB — C-REACTIVE PROTEIN: CRP: <0.5 mg/dL (ref <0.60)

## 2013-07-12 NOTE — Progress Notes (Signed)
Patient ID: Sally Garner, female   DOB: [birth year], 28 y.o.   MRN: [id number]      Reason for office visit Chest pain  Sally Garner is a previously healthy 28 year old woman who is now roughly 2 months postpartum after delivering a healthy baby girl via low transverse C-section. She has no history of serious previous medical problems. About 2-3 years ago she was diagnosed with systemic hypertension but her blood pressure is now normal without any medications. She smoked briefly.  Over the last 2 weeks she has noticed intermittent episodes of sharp retrosternal chest pain that radiates through to her back. It typically occurs when she is breast-feeding and seems to be worse if she does this while lying down rather than sitting up. It is associated with mild dyspnea and chills and noticed a "catching" discomfort in her chest if she tries to take a deep breath. She has not had spontaneous cough or hemoptysis and denies fever, chills, rashes, arthralgias, oral ulcers, epistaxis, leg swelling or calf tenderness, hematuria, exertional dyspnea, palpitations, syncope, focal neurological deficits. She has tried taking ibuprofen for the discomfort but it doesn't really help much.  Her maternal on has systemic lupus erythematosus and high blood pressure runs in her mother's siblings. Otherwise there is no significant family history.   No Known Allergies  Current Outpatient Prescriptions  Medication Sig Dispense Refill  . ibuprofen (ADVIL,MOTRIN) 600 MG tablet Take 1 tablet (600 mg total) by mouth every 6 (six) hours as needed for mild pain.  30 tablet  1   No current facility-administered medications for this visit.    Past Medical History  Diagnosis Date  . Hypertension   . Eczema   . Chlamydia   . Anemia   . GERD (gastroesophageal reflux disease)   . Fibroid     Past Surgical History  Procedure Laterality Date  . No past surgeries    . Cesarean section N/A 05/25/2013    Procedure:  CESAREAN SECTION;  Surgeon: Annalee Genta, DO;  Location: [facility];  Service: Obstetrics;  Laterality: N/A;    Family History  Problem Relation Age of Onset  . Hypertension Maternal Aunt   . Hypertension Maternal Uncle   . Birth defects Brother     polydactyl    History   Social History  . Marital Status: Single    Spouse Name: N/A    Number of Children: N/A  . Years of Education: N/A   Occupational History  . Not on file.   Social History Main Topics  . Smoking status: Former Smoker -- 0.50 packs/day    Types: Cigarettes  . Smokeless tobacco: Never Used  . Alcohol Use: No     Comment: occ  . Drug Use: No  . Sexual Activity: Yes    Birth Control/ Protection: None   Other Topics Concern  . Not on file   Social History Narrative  . No narrative on file    Review of systems: Described in history of present illness  PHYSICAL EXAM BP 122/86  Pulse 84  Resp 20  Ht 5\' 5"  (1.651 m)  Wt 209 lb (94.802 kg)  BMI 34.78 kg/m2  General: Alert, oriented x3, no distress Head: no evidence of trauma, PERRL, EOMI, no exophtalmos or lid lag, no myxedema, no xanthelasma; normal ears, nose and oropharynx Neck: normal jugular venous pulsations and no hepatojugular reflux; brisk carotid pulses without delay and no carotid bruits Chest: clear to auscultation, no signs of consolidation by percussion or  palpation, normal fremitus, symmetrical and full respiratory excursions Cardiovascular: normal position and quality of the apical impulse, regular rhythm, normal first and loud second heart sounds, no murmurs, rubs or gallops Abdomen: no tenderness or distention, no masses by palpation, no abnormal pulsatility or arterial bruits, normal bowel sounds, no hepatosplenomegaly Extremities: no clubbing, cyanosis or edema; 2+ radial, ulnar and brachial pulses bilaterally; 2+ right femoral, posterior tibial and dorsalis pedis pulses; 2+ left femoral, posterior tibial and dorsalis pedis pulses; no  subclavian or femoral bruits Neurological: grossly nonfocal   EKG: Normal sinus rhythm, normal tracing  Lipid Panel  No results found for this basename: chol, trig, hdl, cholhdl, vldl, ldlcalc    BMET    Component Value Date/Time   NA 141 06/05/2013 1209   K 4.2 06/05/2013 1209   CL 105 06/05/2013 1209   CO2 22 06/05/2013 1209   GLUCOSE 81 06/05/2013 1209   BUN 9 06/05/2013 1209   CREATININE 0.70 06/05/2013 1209   CALCIUM 8.6 06/05/2013 1209   GFRNONAA >90 06/05/2013 1209   GFRAA >90 06/05/2013 1209     ASSESSMENT AND PLAN Pleuritic chest pain Her symptoms suggest acute pericarditis, since the discomfort is worse when she is lying down. On physical exam however she has a fairly loud second heart sound. I have recommended that she have an echocardiogram to evaluate for signs of pulmonary hypertension or a pericardial effusion. We'll also check labs for evidence of systemic inflammation and a d-dimer to look for evidence of possible venous thromboembolic disease. Because of her family history we'll do a simple screening tests for autoimmune disease as well. If all of this workup is unrevealing, she may have viral pericarditis which should be a self-limiting problem. Another possible explanation is musculoskeletal pain, possibly related to her posture during breast-feeding. If the echo is normal and there is no evidence of intravascular coagulation or systemic inflammation, I would not recommend further cardiac workup. If abnormalities are found on her tests, we will bring her back for another visit.   Orders Placed This Encounter  Procedures  . EKG 12-Lead   No orders of the defined types were placed in this encounter.    Sally Garner  Sally Klein, MD, [facility] [phone] office [phone] pager

## 2013-07-12 NOTE — Patient Instructions (Signed)
Your physician recommends that you return for lab work in: [facility] 1st FLOOR.  Your physician has requested that you have an echocardiogram. Echocardiography is a painless test that uses sound waves to create images of your heart. It provides your doctor with information about the size and shape of your heart and how well your heart's chambers and valves are working. This procedure takes approximately one hour. There are no restrictions for this procedure.  We will call you with the results and return only as needed.

## 2013-07-12 NOTE — Assessment & Plan Note (Signed)
Her symptoms suggest acute pericarditis, since the discomfort is worse when she is lying down. On physical exam however she has a fairly loud second heart sound. I have recommended that she have an echocardiogram to evaluate for signs of pulmonary hypertension or a pericardial effusion. We'll also check labs for evidence of systemic inflammation and a d-dimer to look for evidence of possible venous thromboembolic disease. Because of her family history we'll do a simple screening tests for autoimmune disease as well. If all of this workup is unrevealing, she may have viral pericarditis which should be a self-limiting problem. Another possible explanation is musculoskeletal pain, possibly related to her posture during breast-feeding. If the echo is normal and there is no evidence of intravascular coagulation or systemic inflammation, I would not recommend further cardiac workup. If abnormalities are found on her tests, we will bring her back for another visit.

## 2013-07-13 LAB — D-DIMER, QUANTITATIVE: D-Dimer, Quant: 0.33 ug/mL-FEU (ref 0.00–0.48)

## 2013-07-16 LAB — ANA: Anti Nuclear Antibody(ANA): NEGATIVE

## 2013-07-18 ENCOUNTER — Ambulatory Visit (HOSPITAL_COMMUNITY)
Admission: RE | Admit: 2013-07-18 | Discharge: 2013-07-18 | Disposition: A | Discharge status: Home / Self Care | Payer: Medicaid Other | Source: Ambulatory Visit | Attending: Cardiovascular Disease | Admitting: Cardiovascular Disease

## 2013-07-18 ENCOUNTER — Telehealth: Payer: Self-pay | Admitting: *Deleted

## 2013-07-18 DIAGNOSIS — I319 Disease of pericardium, unspecified: Secondary | ICD-10-CM

## 2013-07-18 DIAGNOSIS — R079 Chest pain, unspecified: Secondary | ICD-10-CM

## 2013-07-18 DIAGNOSIS — I359 Nonrheumatic aortic valve disorder, unspecified: Secondary | ICD-10-CM | POA: Insufficient documentation

## 2013-07-18 DIAGNOSIS — I369 Nonrheumatic tricuspid valve disorder, unspecified: Secondary | ICD-10-CM

## 2013-07-18 NOTE — Telephone Encounter (Signed)
Message left with test results and told to call if any questions.

## 2013-07-18 NOTE — Progress Notes (Signed)
2D Echocardiogram Complete.  07/18/2013   Kohl Polinsky, RDCS  

## 2013-07-18 NOTE — Telephone Encounter (Signed)
Message copied by Tressa Busman on Thu Jul 18, 2013  5:35 PM ------      Message from: Sanda Klein      Created: Thu Jul 18, 2013  5:23 PM       Echo looks good. No reason to worry about a clot in the lungs, no signs of excessive fluid around the heart. ------

## 2013-07-22 ENCOUNTER — Telehealth: Payer: Self-pay | Admitting: Cardiovascular Disease

## 2013-07-22 NOTE — Telephone Encounter (Signed)
Information given to patient. Patient states she is taking 600 MG Ibuprofen as prescribed at last office visit with Dr Sallyanne Kuster. RN informed to patient to continue wirth current dose.   patient had questions concerning ibuprofen, RN suggest patient to contact pediatrician. Patient voiced understanding.

## 2013-07-22 NOTE — Telephone Encounter (Signed)
Left message to call back  

## 2013-07-22 NOTE — Telephone Encounter (Signed)
RN spoke to patient.she states she received the message about  Her labs and echo. shewanted too know what to do  About the inflammation  (ie... any medication or to se another doctor or what)  Will defer to Dr Sallyanne Kuster and contact patient back.

## 2013-07-22 NOTE — Telephone Encounter (Signed)
Can take OTC ibuprofen 400 mg TID if needed, but if symptoms are not too bad, may not require specific therapy (always best to avoid meds when breast feeding).

## 2013-07-22 NOTE — Telephone Encounter (Signed)
Want echo results from 07-18-13 please.

## 2013-12-01 DIAGNOSIS — Z8742 Personal history of other diseases of the female genital tract: Secondary | ICD-10-CM | POA: Insufficient documentation

## 2013-12-01 DIAGNOSIS — Z872 Personal history of diseases of the skin and subcutaneous tissue: Secondary | ICD-10-CM | POA: Insufficient documentation

## 2013-12-01 DIAGNOSIS — R0602 Shortness of breath: Secondary | ICD-10-CM | POA: Insufficient documentation

## 2013-12-01 DIAGNOSIS — Z720 Tobacco use: Secondary | ICD-10-CM | POA: Insufficient documentation

## 2013-12-01 DIAGNOSIS — Z862 Personal history of diseases of the blood and blood-forming organs and certain disorders involving the immune mechanism: Secondary | ICD-10-CM | POA: Insufficient documentation

## 2013-12-01 DIAGNOSIS — I10 Essential (primary) hypertension: Secondary | ICD-10-CM | POA: Insufficient documentation

## 2013-12-01 DIAGNOSIS — Z8619 Personal history of other infectious and parasitic diseases: Secondary | ICD-10-CM | POA: Insufficient documentation

## 2013-12-01 DIAGNOSIS — R51 Headache: Secondary | ICD-10-CM | POA: Insufficient documentation

## 2013-12-01 DIAGNOSIS — Z8719 Personal history of other diseases of the digestive system: Secondary | ICD-10-CM | POA: Insufficient documentation

## 2013-12-01 DIAGNOSIS — R0789 Other chest pain: Secondary | ICD-10-CM | POA: Insufficient documentation

## 2013-12-02 ENCOUNTER — Encounter (HOSPITAL_COMMUNITY): Payer: Self-pay | Admitting: Emergency Medicine

## 2013-12-02 ENCOUNTER — Emergency Department (HOSPITAL_COMMUNITY)
Admission: EM | Admit: 2013-12-02 | Discharge: 2013-12-02 | Disposition: A | Discharge status: Home / Self Care | Payer: Medicaid Other | Attending: Emergency Medicine | Admitting: Emergency Medicine

## 2013-12-02 DIAGNOSIS — R519 Headache, unspecified: Secondary | ICD-10-CM

## 2013-12-02 DIAGNOSIS — R079 Chest pain, unspecified: Secondary | ICD-10-CM

## 2013-12-02 DIAGNOSIS — R51 Headache: Secondary | ICD-10-CM

## 2013-12-02 LAB — BASIC METABOLIC PANEL
Anion gap: 16 — ABNORMAL HIGH (ref 5–15)
BUN: 9 mg/dL (ref 6–23)
CO2: 24 meq/L (ref 19–32)
CREATININE: 0.78 mg/dL (ref 0.50–1.10)
Calcium: 9.2 mg/dL (ref 8.4–10.5)
Chloride: 101 mEq/L (ref 96–112)
GFR calc Af Amer: >90 mL/min (ref >90)
GFR calc non Af Amer: >90 mL/min (ref >90)
Glucose, Bld: 119 mg/dL — ABNORMAL HIGH (ref 70–99)
Potassium: 3.4 mEq/L — ABNORMAL LOW (ref 3.7–5.3)
Sodium: 141 mEq/L (ref 137–147)

## 2013-12-02 LAB — CBC WITH DIFFERENTIAL/PLATELET
BASOS ABS: 0 10*3/uL (ref 0.0–0.1)
Basophils Relative: 0 % (ref 0–1)
EOS ABS: 0.1 10*3/uL (ref 0.0–0.7)
EOS PCT: 1 % (ref 0–5)
HEMATOCRIT: 34.4 % — AB (ref 36.0–46.0)
Hemoglobin: 11.5 g/dL — ABNORMAL LOW (ref 12.0–15.0)
LYMPHS PCT: 25 % (ref 12–46)
Lymphs Abs: 1.7 10*3/uL (ref 0.7–4.0)
MCH: 27.6 pg (ref 26.0–34.0)
MCHC: 33.4 g/dL (ref 30.0–36.0)
MCV: 82.5 fL (ref 78.0–100.0)
Monocytes Absolute: 0.4 10*3/uL (ref 0.1–1.0)
Monocytes Relative: 5 % (ref 3–12)
Neutro Abs: 4.6 10*3/uL (ref 1.7–7.7)
Neutrophils Relative %: 69 % (ref 43–77)
Platelets: 311 10*3/uL (ref 150–400)
RBC: 4.17 MIL/uL (ref 3.87–5.11)
RDW: 12.8 % (ref 11.5–15.5)
WBC: 6.7 10*3/uL (ref 4.0–10.5)

## 2013-12-02 LAB — I-STAT TROPONIN, ED
TROPONIN I, POC: 0 ng/mL (ref 0.00–0.08)
Troponin i, poc: 0 ng/mL (ref 0.00–0.08)

## 2013-12-02 LAB — D-DIMER, QUANTITATIVE: D-Dimer, Quant: <0.27 ug/mL-FEU (ref 0.00–0.48)

## 2013-12-02 MED ORDER — KETOROLAC TROMETHAMINE 60 MG/2ML IM SOLN
60.0000 mg | Freq: Once | INTRAMUSCULAR | Status: AC
  Administered 2013-12-02: 60 mg via INTRAMUSCULAR
  Filled 2013-12-02: qty 2

## 2013-12-02 NOTE — Discharge Instructions (Signed)
He presented today with chest pain and headache. Your screening tests are reassuring. Your EKG just shows some new changes that you were relatively low risk for heart disease. You should followup with her cardiologist as soon as possible. Your screening test for blood clots was negative.  Chest Pain (Nonspecific) It is often hard to give a specific diagnosis for the cause of chest pain. There is always a chance that your pain could be related to something serious, such as a heart attack or a blood clot in the lungs. You need to follow up with your health care provider for further evaluation. CAUSES   Heartburn.  Pneumonia or bronchitis.  Anxiety or stress.  Inflammation around your heart (pericarditis) or lung (pleuritis or pleurisy).  A blood clot in the lung.  A collapsed lung (pneumothorax). It can develop suddenly on its own (spontaneous pneumothorax) or from trauma to the chest.  Shingles infection (herpes zoster virus). The chest wall is composed of bones, muscles, and cartilage. Any of these can be the source of the pain.  The bones can be bruised by injury.  The muscles or cartilage can be strained by coughing or overwork.  The cartilage can be affected by inflammation and become sore (costochondritis). DIAGNOSIS  Lab tests or other studies may be needed to find the cause of your pain. Your health care provider may have you take a test called an ambulatory electrocardiogram (ECG). An ECG records your heartbeat patterns over a 24-hour period. You may also have other tests, such as:  Transthoracic echocardiogram (TTE). During echocardiography, sound waves are used to evaluate how blood flows through your heart.  Transesophageal echocardiogram (TEE).  Cardiac monitoring. This allows your health care provider to monitor your heart rate and rhythm in real time.  Holter monitor. This is a portable device that records your heartbeat and can help diagnose heart arrhythmias. It  allows your health care provider to track your heart activity for several days, if needed.  Stress tests by exercise or by giving medicine that makes the heart beat faster. TREATMENT   Treatment depends on what may be causing your chest pain. Treatment may include:  Acid blockers for heartburn.  Anti-inflammatory medicine.  Pain medicine for inflammatory conditions.  Antibiotics if an infection is present.  You may be advised to change lifestyle habits. This includes stopping smoking and avoiding alcohol, caffeine, and chocolate.  You may be advised to keep your head raised (elevated) when sleeping. This reduces the chance of acid going backward from your stomach into your esophagus. Most of the time, nonspecific chest pain will improve within 2-3 days with rest and mild pain medicine.  HOME CARE INSTRUCTIONS   If antibiotics were prescribed, take them as directed. Finish them even if you start to feel better.  For the next few days, avoid physical activities that bring on chest pain. Continue physical activities as directed.  Do not use any tobacco products, including cigarettes, chewing tobacco, or electronic cigarettes.  Avoid drinking alcohol.  Only take medicine as directed by your health care provider.  Follow your health care provider's suggestions for further testing if your chest pain does not go away.  Keep any follow-up appointments you made. If you do not go to an appointment, you could develop lasting (chronic) problems with pain. If there is any problem keeping an appointment, call to reschedule. SEEK MEDICAL CARE IF:   Your chest pain does not go away, even after treatment.  You have a rash with  blisters on your chest.  You have a fever. SEEK IMMEDIATE MEDICAL CARE IF:   You have increased chest pain or pain that spreads to your arm, neck, jaw, back, or abdomen.  You have shortness of breath.  You have an increasing cough, or you cough up blood.  You  have severe back or abdominal pain.  You feel nauseous or vomit.  You have severe weakness.  You faint.  You have chills. This is an emergency. Do not wait to see if the pain will go away. Get medical help at once. Call your local emergency services (911 in [place] Do not drive yourself to the hospital. MAKE SURE YOU:   Understand these instructions.  Will watch your condition.  Will get help right away if you are not doing well or get worse. Document Released: 11/17/2004 Document Revised: 02/12/2013 Document Reviewed: 09/13/2007 [facility] Patient Information 2015 [place], [place]. This information is not intended to replace advice given to you by your health care provider. Make sure you discuss any questions you have with your health care provider. General Headache Without Cause A headache is pain or discomfort felt around the head or neck area. The specific cause of a headache may not be found. There are many causes and types of headaches. A few common ones are:  Tension headaches.  Migraine headaches.  Cluster headaches.  Chronic daily headaches. HOME CARE INSTRUCTIONS   Keep all follow-up appointments with your caregiver or any specialist referral.  Only take over-the-counter or prescription medicines for pain or discomfort as directed by your caregiver.  Lie down in a dark, quiet room when you have a headache.  Keep a headache journal to find out what may trigger your migraine headaches. For example, write down:  What you eat and drink.  How much sleep you get.  Any change to your diet or medicines.  Try massage or other relaxation techniques.  Put ice packs or heat on the head and neck. Use these 3 to 4 times per day for 15 to 20 minutes each time, or as needed.  Limit stress.  Sit up straight, and do not tense your muscles.  Quit smoking if you smoke.  Limit alcohol use.  Decrease the amount of caffeine you drink, or stop drinking caffeine.  Eat and  sleep on a regular schedule.  Get 7 to 9 hours of sleep, or as recommended by your caregiver.  Keep lights dim if bright lights bother you and make your headaches worse. SEEK MEDICAL CARE IF:   You have problems with the medicines you were prescribed.  Your medicines are not working.  You have a change from the usual headache.  You have nausea or vomiting. SEEK IMMEDIATE MEDICAL CARE IF:   Your headache becomes severe.  You have a fever.  You have a stiff neck.  You have loss of vision.  You have muscular weakness or loss of muscle control.  You start losing your balance or have trouble walking.  You feel faint or pass out.  You have severe symptoms that are different from your first symptoms. MAKE SURE YOU:   Understand these instructions.  Will watch your condition.  Will get help right away if you are not doing well or get worse. Document Released: 02/07/2005 Document Revised: 05/02/2011 Document Reviewed: 02/23/2011 [facility] Patient Information 2015 [place], [place]. This information is not intended to replace advice given to you by your health care provider. Make sure you discuss any questions you have with your  health care provider. ° °

## 2013-12-02 NOTE — ED Provider Notes (Signed)
CSN: [id number]     Arrival date & time 12/01/13  2349 History   First MD Initiated Contact with Patient 12/02/13 [phone]     Chief Complaint  Patient presents with  . Chest Pain  . Headache  . Hypertension     (Consider location/radiation/quality/duration/timing/severity/associated sxs/prior Treatment) HPI  This is a 28 year old female who presents with chest pain and headache. Patient reports that she has had chest pain ongoing since the birth of her child 6 months ago. It waxes and wanes. It is sharp. She currently denies any chest pain does endorse some shortness of breath which is also been present for many months. Pain is worse with inspiration. She denies any lower extremity swelling.  Patient has been evaluated by her cardiologist. Only risk factor for ACS is hypertension. Patient also reports occipital headache. She states that she normally gets headaches and will take ibuprofen with relief of her symptoms. She states that today ibuprofen is not helping. Currently her headache is 5/10. She denies any weakness, numbness, tingling, vision changes.  Past Medical History  Diagnosis Date  . Hypertension   . Eczema   . Chlamydia   . Anemia   . GERD (gastroesophageal reflux disease)   . Fibroid    Past Surgical History  Procedure Laterality Date  . No past surgeries    . Cesarean section N/A 05/25/2013    Procedure: CESAREAN SECTION;  Surgeon: Annalee Genta, DO;  Location: [facility];  Service: Obstetrics;  Laterality: N/A;   Family History  Problem Relation Age of Onset  . Hypertension Maternal Aunt   . Hypertension Maternal Uncle   . Birth defects Brother     polydactyl   History  Substance Use Topics  . Smoking status: Current Every Day Smoker -- 0.50 packs/day    Types: Cigarettes  . Smokeless tobacco: Never Used  . Alcohol Use: No     Comment: occ   OB History   Grav Para Term Preterm Abortions TAB SAB Ect Mult Living   1 1 1       1      Review of Systems   Constitutional: Negative for fever.  Respiratory: Positive for chest tightness and shortness of breath. Negative for cough.   Cardiovascular: Positive for chest pain. Negative for palpitations and leg swelling.  Gastrointestinal: Negative for nausea, vomiting and abdominal pain.  Genitourinary: Negative for dysuria.  Musculoskeletal: Negative for back pain.  Skin: Negative for wound.  Neurological: Positive for headaches. Negative for dizziness, weakness, light-headedness and numbness.  Psychiatric/Behavioral: Negative for confusion.  All other systems reviewed and are negative.     Allergies  Review of patient's allergies indicates no known allergies.  Home Medications   Prior to Admission medications   Medication Sig Start Date End Date Taking? Authorizing Provider  ibuprofen (ADVIL,MOTRIN) 200 MG tablet Take 600 mg by mouth every 6 (six) hours as needed for moderate pain.   Yes Historical Provider, MD   BP 134/91  Pulse 79  Temp(Src) 98.3 F (36.8 C) (Oral)  Resp 18  Ht 5\' 5"  (1.651 m)  Wt 205 lb (92.987 kg)  BMI 34.11 kg/m2  SpO2 99%  Breastfeeding? Yes Physical Exam  Nursing note and vitals reviewed. Constitutional: She is oriented to person, place, and time. She appears well-developed and well-nourished. No distress.  HENT:  Head: Normocephalic and atraumatic.  Eyes: EOM are normal. Pupils are equal, round, and reactive to light.  Cardiovascular: Normal rate, regular rhythm and normal heart sounds.  No murmur heard. Pulmonary/Chest: Effort normal and breath sounds normal. No respiratory distress. She has no wheezes.  Abdominal: Soft. Bowel sounds are normal. There is no tenderness. There is no rebound.  Neurological: She is alert and oriented to person, place, and time.  Skin: Skin is warm and dry.  Psychiatric: She has a normal mood and affect.    ED Course  Procedures (including critical care time) Labs Review Labs Reviewed  CBC WITH DIFFERENTIAL -  Abnormal; Notable for the following:    Hemoglobin 11.5 (*)    HCT 34.4 (*)    All other components within normal limits  BASIC METABOLIC PANEL - Abnormal; Notable for the following:    Potassium 3.4 (*)    Glucose, Bld 119 (*)    Anion gap 16 (*)    All other components within normal limits  D-DIMER, QUANTITATIVE  I-STAT TROPOININ, ED  I-STAT TROPOININ, ED    Imaging Review No results found.   EKG Interpretation   Date/Time:  Monday December 02 2013 00:24:42 EDT Ventricular Rate:  82 PR Interval:  174 QRS Duration: 86 QT Interval:  380 QTC Calculation: 443 R Axis:   61 Text Interpretation:  Normal sinus rhythm T wave abnormality, consider  inferior ischemia T wave abnormality, consider anterolateral ischemia  Abnormal ECG T wave inversions new when compared to prior Confirmed by  HORTON  MD, [place] ([id number]) on 12/02/2013 2:23:10 AM      MDM   Final diagnoses:  Nonintractable headache, unspecified chronicity pattern, unspecified headache type  Chest pain, unspecified chest pain type    Patient presents with headache and chest pain. She is nontoxic and nonfocal on exam. Vital signs are reassuring. Has been ongoing it is atypical for ACS. EKG does show T wave inversions which are new when compared to prior. Otherwise no evidence of acute ischemia. Patient is currently chest pain-free. Screening d-dimer sent to evaluate for blood clots this is negative. Patient was given Toradol for her headache with improvement of her symptoms. Patient has been evaluated Dr. Orene Desanctis suspected pericarditis.  Patient was placed on NSAIDs and had echo in June. Delta troponin here is negative. Low suspicion for ACS. Patient is chest pain-free free.  Patient instructed to followup with her cardiologist.  After history, exam, and medical workup I feel the patient has been appropriately medically screened and is safe for discharge home. Pertinent diagnoses were discussed with the patient. Patient  was given return precautions.     Merryl Hacker, MD 12/02/13 [phone]

## 2013-12-02 NOTE — ED Notes (Signed)
Patient here stating after she delivered her baby ( 6 months ago and C section) she had some CP and an EKG was done.  This time she started with CP about 4 days ago and a headache.

## 2013-12-23 ENCOUNTER — Encounter (HOSPITAL_COMMUNITY): Payer: Self-pay | Admitting: Emergency Medicine

## 2014-02-05 ENCOUNTER — Encounter (HOSPITAL_COMMUNITY): Payer: Self-pay | Admitting: Emergency Medicine

## 2014-02-05 ENCOUNTER — Emergency Department (HOSPITAL_COMMUNITY): Payer: Medicaid Other

## 2014-02-05 ENCOUNTER — Emergency Department (HOSPITAL_COMMUNITY)
Admission: EM | Admit: 2014-02-05 | Discharge: 2014-02-05 | Disposition: A | Discharge status: Home / Self Care | Payer: Medicaid Other | Attending: Emergency Medicine | Admitting: Emergency Medicine

## 2014-02-05 DIAGNOSIS — Z872 Personal history of diseases of the skin and subcutaneous tissue: Secondary | ICD-10-CM | POA: Insufficient documentation

## 2014-02-05 DIAGNOSIS — Z8619 Personal history of other infectious and parasitic diseases: Secondary | ICD-10-CM | POA: Insufficient documentation

## 2014-02-05 DIAGNOSIS — M549 Dorsalgia, unspecified: Secondary | ICD-10-CM | POA: Insufficient documentation

## 2014-02-05 DIAGNOSIS — R079 Chest pain, unspecified: Secondary | ICD-10-CM

## 2014-02-05 DIAGNOSIS — Z8742 Personal history of other diseases of the female genital tract: Secondary | ICD-10-CM | POA: Insufficient documentation

## 2014-02-05 DIAGNOSIS — Z8719 Personal history of other diseases of the digestive system: Secondary | ICD-10-CM | POA: Insufficient documentation

## 2014-02-05 DIAGNOSIS — R091 Pleurisy: Secondary | ICD-10-CM | POA: Insufficient documentation

## 2014-02-05 DIAGNOSIS — Z720 Tobacco use: Secondary | ICD-10-CM | POA: Insufficient documentation

## 2014-02-05 DIAGNOSIS — I10 Essential (primary) hypertension: Secondary | ICD-10-CM | POA: Insufficient documentation

## 2014-02-05 DIAGNOSIS — Z862 Personal history of diseases of the blood and blood-forming organs and certain disorders involving the immune mechanism: Secondary | ICD-10-CM | POA: Insufficient documentation

## 2014-02-05 DIAGNOSIS — M25511 Pain in right shoulder: Secondary | ICD-10-CM | POA: Insufficient documentation

## 2014-02-05 LAB — BASIC METABOLIC PANEL
Anion gap: 13 (ref 5–15)
BUN: 7 mg/dL (ref 6–23)
CO2: 24 mEq/L (ref 19–32)
CREATININE: 0.57 mg/dL (ref 0.50–1.10)
Calcium: 9.3 mg/dL (ref 8.4–10.5)
Chloride: 100 mEq/L (ref 96–112)
Glucose, Bld: 113 mg/dL — ABNORMAL HIGH (ref 70–99)
Potassium: 3.5 mEq/L — ABNORMAL LOW (ref 3.7–5.3)
Sodium: 137 mEq/L (ref 137–147)

## 2014-02-05 LAB — I-STAT TROPONIN, ED: Troponin i, poc: 0 ng/mL (ref 0.00–0.08)

## 2014-02-05 LAB — D-DIMER, QUANTITATIVE (NOT AT ARMC): D DIMER QUANT: 0.35 ug{FEU}/mL (ref 0.00–0.48)

## 2014-02-05 LAB — CBC
HEMATOCRIT: 35.8 % — AB (ref 36.0–46.0)
Hemoglobin: 12 g/dL (ref 12.0–15.0)
MCH: 28 pg (ref 26.0–34.0)
MCHC: 33.5 g/dL (ref 30.0–36.0)
MCV: 83.6 fL (ref 78.0–100.0)
Platelets: 316 10*3/uL (ref 150–400)
RBC: 4.28 MIL/uL (ref 3.87–5.11)
RDW: 13 % (ref 11.5–15.5)
WBC: 7.5 10*3/uL (ref 4.0–10.5)

## 2014-02-05 MED ORDER — MORPHINE SULFATE 4 MG/ML IJ SOLN
4.0000 mg | INTRAMUSCULAR | Status: DC | PRN
  Administered 2014-02-05: 4 mg via INTRAVENOUS
  Filled 2014-02-05: qty 1

## 2014-02-05 MED ORDER — NAPROXEN 500 MG PO TABS: 500.0000 mg | ORAL_TABLET | Freq: Two times a day (BID) | ORAL | Status: DC

## 2014-02-05 MED ORDER — ONDANSETRON HCL 4 MG/2ML IJ SOLN
4.0000 mg | Freq: Once | INTRAMUSCULAR | Status: AC
  Administered 2014-02-05: 4 mg via INTRAVENOUS
  Filled 2014-02-05: qty 2

## 2014-02-05 NOTE — ED Notes (Signed)
MD at bedside. 

## 2014-02-05 NOTE — ED Notes (Signed)
Pt. reports central chest pain onset this evening , denies SOB , no nausea or diaphoresis .

## 2014-02-05 NOTE — Discharge Instructions (Signed)
Chest Wall Pain °Chest wall pain is pain in or around the bones and muscles of your chest. It may take up to 6 weeks to get better. It may take longer if you must stay physically active in your work and activities.  °CAUSES  °Chest wall pain may happen on its own. However, it may be caused by: °· A viral illness like the flu. °· Injury. °· Coughing. °· Exercise. °· Arthritis. °· Fibromyalgia. °· Shingles. °HOME CARE INSTRUCTIONS  °· Avoid overtiring physical activity. Try not to strain or perform activities that cause pain. This includes any activities using your chest or your abdominal and side muscles, especially if heavy weights are used. °· Put ice on the sore area. °· Put ice in a plastic bag. °· Place a towel between your skin and the bag. °· Leave the ice on for 15-20 minutes per hour while awake for the first 2 days. °· Only take over-the-counter or prescription medicines for pain, discomfort, or fever as directed by your caregiver. °SEEK IMMEDIATE MEDICAL CARE IF:  °· Your pain increases, or you are very uncomfortable. °· You have a fever. °· Your chest pain becomes worse. °· You have new, unexplained symptoms. °· You have nausea or vomiting. °· You feel sweaty or lightheaded. °· You have a cough with phlegm (sputum), or you cough up blood. °MAKE SURE YOU:  °· Understand these instructions. °· Will watch your condition. °· Will get help right away if you are not doing well or get worse. °Document Released: 02/07/2005 Document Revised: 05/02/2011 Document Reviewed: 10/04/2010 °[employer]® Patient Information ©2015 [employer]. This information is not intended to replace advice given to you by your health care provider. Make sure you discuss any questions you have with your health care provider. ° °Pleurisy °Pleurisy is redness, puffiness (swelling), and soreness (inflammation) of the lining of the lungs. It can be hard to breathe and hurt to breathe. Coughing or deep breathing will make it hurt more. It is  often caused by an existing infection or disease.  °HOME CARE °· Only take medicine as told by your doctor. °· Only take antibiotic medicine as directed. Make sure to finish it even if you start to feel better. °GET HELP RIGHT AWAY IF:  °· Your lips, fingernails, or toenails are blue or dark. °· You cough up blood. °· You have a hard time breathing. °· Your pain is not controlled with medicine or it lasts for more than 1 week. °· Your pain spreads (radiates) into your neck, arms, or jaw. °· You are short of breath or wheezing. °· You develop a fever, rash, throw up (vomit), or faint. °MAKE SURE YOU:  °· Understand these instructions. °· Will watch your condition. °· Will get help right away if you are not doing well or get worse. °Document Released: 01/21/2008 Document Revised: 10/10/2012 Document Reviewed: 07/22/2012 °[employer]® Patient Information ©2015 [employer]. This information is not intended to replace advice given to you by your health care provider. Make sure you discuss any questions you have with your health care provider. ° °

## 2014-02-05 NOTE — ED Provider Notes (Signed)
CSN: [id number]     Arrival date & time 02/05/14  0117 History  This chart was scribed for Tanna Furry, MD by Delphia Grates, ED Scribe. This patient was seen in room D36C/D36C and the patient's care was started at 1:41 AM.   Chief Complaint  Patient presents with  . Chest Pain    The history is provided by the patient. No language interpreter was used.     HPI Comments: Geniece Akers is a 28 y.o. female who presents to the Emergency Department complaining of sudden onset central chest pain that began 30 minutes ago. Patient notes the pain radiates to right shoulder and to the middle of the back. Patient states she was laying down prior to onset of chest pain and denies experiencing any prior symptoms throughout the day. She notes associated SOB earlier, but states this has resolved. She reports history of the same, and was evaluated, however, nothing remarkable was found. She reports history HTN since May 25, 2013 (after giving birth to her daughter). She states that was medicated for this, but was taken off the medication due to HA side effects. She denies leg swelling, nausea, vomiting, diaphoresis, HA, GERD symptoms, or hormone/BC use. Patient is a nonsmoker.   Past Medical History  Diagnosis Date  . Hypertension   . Eczema   . Chlamydia   . Anemia   . GERD (gastroesophageal reflux disease)   . Fibroid    Past Surgical History  Procedure Laterality Date  . No past surgeries    . Cesarean section N/A 05/25/2013    Procedure: CESAREAN SECTION;  Surgeon: Annalee Genta, DO;  Location: [facility];  Service: Obstetrics;  Laterality: N/A;   Family History  Problem Relation Age of Onset  . Hypertension Maternal Aunt   . Hypertension Maternal Uncle   . Birth defects Brother     polydactyl   History  Substance Use Topics  . Smoking status: Current Every Day Smoker -- 0.50 packs/day    Types: Cigarettes  . Smokeless tobacco: Never Used  . Alcohol Use: No     Comment: occ   OB  History    Gravida Para Term Preterm AB TAB SAB Ectopic Multiple Living   1 1 1       1      Review of Systems  Constitutional: Negative for fever, chills, diaphoresis, appetite change and fatigue.  HENT: Negative for mouth sores, sore throat and trouble swallowing.   Eyes: Negative for visual disturbance.  Respiratory: Positive for shortness of breath (resolved). Negative for cough, chest tightness and wheezing.   Cardiovascular: Positive for chest pain (central). Negative for leg swelling.  Gastrointestinal: Negative for nausea, vomiting, abdominal pain, diarrhea and abdominal distention.  Endocrine: Negative for polydipsia, polyphagia and polyuria.  Genitourinary: Negative for dysuria, frequency and hematuria.  Musculoskeletal: Positive for back pain and arthralgias (right shoulder). Negative for gait problem.  Skin: Negative for color change, pallor and rash.  Neurological: Negative for dizziness, syncope, light-headedness and headaches.  Hematological: Does not bruise/bleed easily.  Psychiatric/Behavioral: Negative for behavioral problems and confusion.      Allergies  Review of patient's allergies indicates no known allergies.  Home Medications   Prior to Admission medications   Medication Sig Start Date End Date Taking? Authorizing Provider  ibuprofen (ADVIL,MOTRIN) 200 MG tablet Take 600 mg by mouth every 6 (six) hours as needed for moderate pain.   Yes Historical Provider, MD  naproxen (NAPROSYN) 500 MG tablet Take 1 tablet (500  mg total) by mouth 2 (two) times daily. 02/05/14   Tanna Furry, MD   Triage Vitals: BP 169/114 mmHg  Pulse 93  Temp(Src) 98 F (36.7 C) (Oral)  Resp 16  SpO2 100%  Physical Exam  Constitutional: She is oriented to person, place, and time. She appears well-developed and well-nourished. No distress.  HENT:  Head: Normocephalic.  Eyes: Conjunctivae are normal. Pupils are equal, round, and reactive to light. No scleral icterus.  Neck: Normal  range of motion. Neck supple. No thyromegaly present.  Cardiovascular: Normal rate, regular rhythm and normal heart sounds.  Exam reveals no gallop and no friction rub.   No murmur heard. Pulmonary/Chest: Effort normal and breath sounds normal. No respiratory distress. She has no wheezes. She has no rales.  Abdominal: Soft. Bowel sounds are normal. She exhibits no distension. There is no tenderness. There is no rebound.  Musculoskeletal: Normal range of motion.  Neurological: She is alert and oriented to person, place, and time.  Skin: Skin is warm and dry. No rash noted.  Psychiatric: She has a normal mood and affect. Her behavior is normal.  Nursing note and vitals reviewed.   ED Course  Procedures (including critical care time)  DIAGNOSTIC STUDIES: Oxygen Saturation is 100% on room air, normal by my interpretation.    COORDINATION OF CARE: At 0144 Discussed treatment plan with patient which includes labs. Patient agrees.   Labs Review Labs Reviewed  CBC - Abnormal; Notable for the following:    HCT 35.8 (*)    All other components within normal limits  BASIC METABOLIC PANEL - Abnormal; Notable for the following:    Potassium 3.5 (*)    Glucose, Bld 113 (*)    All other components within normal limits  D-DIMER, QUANTITATIVE  I-STAT TROPOININ, ED    Imaging Review No results found.   EKG Interpretation   Date/Time:  Wednesday February 05 2014 01:23:14 EST Ventricular Rate:  93 PR Interval:  176 QRS Duration: 80 QT Interval:  354 QTC Calculation: 440 R Axis:   50 Text Interpretation:  Normal sinus rhythm Nonspecific T wave abnormality  Abnormal ECG Confirmed by Jeneen Rinks  MD, [place] ([id number]) on 02/05/2014 1:35:41 AM      MDM   Final diagnoses:  Pleurisy   Studies are normal. Patient is sleeping. Upon awakening she is symptom-free. Ambulatory without recurrence of pain. Not febrile or hypoxemic. No pneumonia or pneumothorax on chest x-ray. No risks for heart disease  other than smoking. Negative d-dimer. I think she is appropriate for outpatient follow-up. Treatment with anti-inflammatories for probable chest wall pain. I personally performed the services described in this documentation, which was scribed in my presence. The recorded information has been reviewed and is accurate.    Tanna Furry, MD 02/05/14 [phone]

## 2015-02-20 ENCOUNTER — Emergency Department (HOSPITAL_COMMUNITY)
Admission: EM | Admit: 2015-02-20 | Discharge: 2015-02-20 | Disposition: A | Discharge status: Home / Self Care | Payer: Self-pay | Attending: Emergency Medicine | Admitting: Emergency Medicine

## 2015-02-20 ENCOUNTER — Encounter (HOSPITAL_COMMUNITY): Payer: Self-pay | Admitting: *Deleted

## 2015-02-20 ENCOUNTER — Emergency Department (HOSPITAL_COMMUNITY): Payer: Self-pay

## 2015-02-20 DIAGNOSIS — I10 Essential (primary) hypertension: Secondary | ICD-10-CM | POA: Insufficient documentation

## 2015-02-20 DIAGNOSIS — Z86018 Personal history of other benign neoplasm: Secondary | ICD-10-CM | POA: Insufficient documentation

## 2015-02-20 DIAGNOSIS — Z872 Personal history of diseases of the skin and subcutaneous tissue: Secondary | ICD-10-CM | POA: Insufficient documentation

## 2015-02-20 DIAGNOSIS — Z8619 Personal history of other infectious and parasitic diseases: Secondary | ICD-10-CM | POA: Insufficient documentation

## 2015-02-20 DIAGNOSIS — Z862 Personal history of diseases of the blood and blood-forming organs and certain disorders involving the immune mechanism: Secondary | ICD-10-CM | POA: Insufficient documentation

## 2015-02-20 DIAGNOSIS — R000 Tachycardia, unspecified: Secondary | ICD-10-CM | POA: Insufficient documentation

## 2015-02-20 DIAGNOSIS — J069 Acute upper respiratory infection, unspecified: Secondary | ICD-10-CM | POA: Insufficient documentation

## 2015-02-20 DIAGNOSIS — Z8719 Personal history of other diseases of the digestive system: Secondary | ICD-10-CM | POA: Insufficient documentation

## 2015-02-20 DIAGNOSIS — R079 Chest pain, unspecified: Secondary | ICD-10-CM | POA: Insufficient documentation

## 2015-02-20 MED ORDER — GUAIFENESIN 100 MG/5ML PO LIQD: 100.0000 mg | ORAL | Status: DC | PRN

## 2015-02-20 MED ORDER — PROMETHAZINE-DM 6.25-15 MG/5ML PO SYRP: 5.0000 mL | ORAL_SOLUTION | Freq: Four times a day (QID) | ORAL | Status: DC | PRN

## 2015-02-20 NOTE — ED Notes (Signed)
The pt has had a cold and cough for 3 weeks  It was getting better but became worse one week ago .  No temp chest congestion productive cough   Clear.  The coughing is getting worse with nausea.  Unknown temp lmp dec 5th

## 2015-02-20 NOTE — Discharge Instructions (Signed)
Upper Respiratory Infection, Adult Most upper respiratory infections (URIs) are a viral infection of the air passages leading to the lungs. A URI affects the nose, throat, and upper air passages. The most common type of URI is nasopharyngitis and is typically referred to as "the common cold." URIs run their course and usually go away on their own. Most of the time, a URI does not require medical attention, but sometimes a bacterial infection in the upper airways can follow a viral infection. This is called a secondary infection. Sinus and middle ear infections are common types of secondary upper respiratory infections. Bacterial pneumonia can also complicate a URI. A URI can worsen asthma and chronic obstructive pulmonary disease (COPD). Sometimes, these complications can require emergency medical care and may be life threatening.  CAUSES Almost all URIs are caused by viruses. A virus is a type of germ and can spread from one person to another.  RISKS FACTORS You may be at risk for a URI if:   You smoke.   You have chronic heart or lung disease.  You have a weakened defense (immune) system.   You are very young or very old.   You have nasal allergies or asthma.  You work in crowded or poorly ventilated areas.  You work in health care facilities or schools. SIGNS AND SYMPTOMS  Symptoms typically develop 2-3 days after you come in contact with a cold virus. Most viral URIs last 7-10 days. However, viral URIs from the influenza virus (flu virus) can last 14-18 days and are typically more severe. Symptoms may include:   Runny or stuffy (congested) nose.   Sneezing.   Cough.   Sore throat.   Headache.   Fatigue.   Fever.   Loss of appetite.   Pain in your forehead, behind your eyes, and over your cheekbones (sinus pain).  Muscle aches.  DIAGNOSIS  Your health care provider may diagnose a URI by:  Physical exam.  Tests to check that your symptoms are not due to  another condition such as:  Strep throat.  Sinusitis.  Pneumonia.  Asthma. TREATMENT  A URI goes away on its own with time. It cannot be cured with medicines, but medicines may be prescribed or recommended to relieve symptoms. Medicines may help:  Reduce your fever.  Reduce your cough.  Relieve nasal congestion. HOME CARE INSTRUCTIONS   Take medicines only as directed by your health care provider.   Gargle warm saltwater or take cough drops to comfort your throat as directed by your health care provider.  Use a warm mist humidifier or inhale steam from a shower to increase air moisture. This may make it easier to breathe.  Drink enough fluid to keep your urine clear or pale yellow.   Eat soups and other clear broths and maintain good nutrition.   Rest as needed.   Return to work when your temperature has returned to normal or as your health care provider advises. You may need to stay home longer to avoid infecting others. You can also use a face mask and careful hand washing to prevent spread of the virus.  Increase the usage of your inhaler if you have asthma.   Do not use any tobacco products, including cigarettes, chewing tobacco, or electronic cigarettes. If you need help quitting, ask your health care provider. PREVENTION  The best way to protect yourself from getting a cold is to practice good hygiene.   Avoid oral or hand contact with people with cold   symptoms.   Wash your hands often if contact occurs.  There is no clear evidence that vitamin C, vitamin E, echinacea, or exercise reduces the chance of developing a cold. However, it is always recommended to get plenty of rest, exercise, and practice good nutrition.  SEEK MEDICAL CARE IF:   You are getting worse rather than better.   Your symptoms are not controlled by medicine.   You have chills.  You have worsening shortness of breath.  You have brown or red mucus.  You have yellow or brown nasal  discharge.  You have pain in your face, especially when you bend forward.  You have a fever.  You have swollen neck glands.  You have pain while swallowing.  You have white areas in the back of your throat. SEEK IMMEDIATE MEDICAL CARE IF:   You have severe or persistent:  Headache.  Ear pain.  Sinus pain.  Chest pain.  You have chronic lung disease and any of the following:  Wheezing.  Prolonged cough.  Coughing up blood.  A change in your usual mucus.  You have a stiff neck.  You have changes in your:  Vision.  Hearing.  Thinking.  Mood. MAKE SURE YOU:   Understand these instructions.  Will watch your condition.  Will get help right away if you are not doing well or get worse.   This information is not intended to replace advice given to you by your health care provider. Make sure you discuss any questions you have with your health care provider.   Document Released: 08/03/2000 Document Revised: 06/24/2014 Document Reviewed: 05/15/2013 Elsevier Interactive Patient Education 2016 [employer]vier Inc.  

## 2015-02-20 NOTE — ED Provider Notes (Signed)
CSN: [id number]   Arrival date & time 02/20/15 1944  History  By signing my name below, I, Sally Garner, attest that this documentation has been prepared under the direction and in the presence of  Sally Moras PA-C Electronically Signed: Altamease Garner, ED Scribe. 02/20/2015. 11:09 PM. Chief Complaint  Patient presents with  . Cough    HPI The history is provided by the patient. No language interpreter was used.   Sally Garner is a 29 y.o. female who presents to the Emergency Department complaining of a dry cough with onset 3 weeks ago. She states that the cough had improved but returned 3 days ago.  Associated symptoms include post-tussive emesis for 2 days, sore throat for 2 days, chest pain for 2 days, and headache (intermittent, sharp, diffuse). Theraflu, cough drops, and Tussin have provided insufficient relief at home. Pt denies fever, chills, rhinorrhea, sneezing, SOB, and LE edema. She has had no known sick contact but she works in [employer] (medical). Tobacco+. Pt denies daily medication use. She also denies personal history of cardiac disease or asthma. No known family history significant for cardiac disease.   Past Medical History  Diagnosis Date  . Hypertension   . Eczema   . Chlamydia   . Anemia   . GERD (gastroesophageal reflux disease)   . Fibroid     Past Surgical History  Procedure Laterality Date  . No past surgeries    . Cesarean section N/A 05/25/2013    Procedure: CESAREAN SECTION;  Surgeon: Annalee Genta, DO;  Location: [facility];  Service: Obstetrics;  Laterality: N/A;    Family History  Problem Relation Age of Onset  . Hypertension Maternal Aunt   . Hypertension Maternal Uncle   . Birth defects Brother     polydactyl    Social History  Substance Use Topics  . Smoking status: Current Every Day Smoker -- 0.50 packs/day    Types: Cigarettes  . Smokeless tobacco: Never Used  . Alcohol Use: No     Comment: occ     Review of Systems  Constitutional: Negative  for fever and chills.  HENT: Negative for rhinorrhea and sneezing.   Respiratory: Positive for cough.   Cardiovascular: Positive for chest pain. Negative for leg swelling.  Gastrointestinal: Positive for vomiting. Negative for nausea.  Neurological: Positive for headaches.    Home Medications   Prior to Admission medications   Medication Sig Start Date End Date Taking? Authorizing Provider  guaiFENesin (ROBITUSSIN) 100 MG/5ML liquid Take 5-10 mLs (100-200 mg total) by mouth every 4 (four) hours as needed for congestion. 02/20/15   Sally Moras, PA-C  ibuprofen (ADVIL,MOTRIN) 200 MG tablet Take 600 mg by mouth every 6 (six) hours as needed for moderate pain.    Historical Provider, MD  naproxen (NAPROSYN) 500 MG tablet Take 1 tablet (500 mg total) by mouth 2 (two) times daily. 02/05/14   Tanna Furry, MD  promethazine-dextromethorphan (PROMETHAZINE-DM) 6.25-15 MG/5ML syrup Take 5 mLs by mouth 4 (four) times daily as needed for cough. 02/20/15   Sally Moras, PA-C    Allergies  Review of patient's allergies indicates no known allergies.  Triage Vitals: BP 145/98 mmHg  Pulse 112  Temp(Src) 98.6 F (37 C) (Oral)  Resp 18  SpO2 98%  LMP 01/27/2015  Breastfeeding? Yes  Physical Exam  Constitutional: She is oriented to person, place, and time. She appears well-developed and well-nourished. No distress.  HENT:  Head: Normocephalic and atraumatic.  Right Ear: Tympanic membrane normal.  Left  Ear: Tympanic membrane normal.  Nose: Nose normal.  Mouth/Throat: Uvula is midline. No oropharyngeal exudate.  Mild tonsillar enlargement with no exudate  Eyes: Conjunctivae and EOM are normal.  Neck: Neck supple. No tracheal deviation present.  Cardiovascular: Tachycardia present.  Exam reveals no gallop and no friction rub.   No murmur heard. Pulmonary/Chest: Effort normal. No respiratory distress. She has no wheezes.  Musculoskeletal: Normal range of motion. She exhibits no edema.  No LE edema   Lymphadenopathy:    She has no cervical adenopathy.  Neurological: She is alert and oriented to person, place, and time.  Skin: Skin is warm and dry.  Psychiatric: She has a normal mood and affect. Her behavior is normal.  Nursing note and vitals reviewed.   ED Course  Procedures  DIAGNOSTIC STUDIES: Oxygen Saturation is 98% on RA,  normal by my interpretation.    COORDINATION OF CARE: 11:06 PM Discussed treatment plan which includes CXR with pt at bedside and pt agreed to the plan.  Labs Review- Labs Reviewed - No data to display  Imaging Review Dg Chest 2 View  02/20/2015  CLINICAL DATA:  29 year old female with history of cough for 3 weeks and vomiting for 2 days. Chest pain for 1 day. EXAM: CHEST  2 VIEW COMPARISON:  Chest x-ray 02/05/2014. FINDINGS: Lung volumes are normal. No consolidative airspace disease. No pleural effusions. No pneumothorax. No pulmonary nodule or mass noted. Pulmonary vasculature and the cardiomediastinal silhouette are within normal limits. IMPRESSION: No radiographic evidence of acute cardiopulmonary disease. Electronically Signed   By: Sally Garner M.D.   On: 02/20/2015 20:28    MDM  Pt CXR negative for acute infiltrate. Patients symptoms are consistent with URI, likely viral etiology. Discussed that antibiotics are not indicated for viral infections. Pt will be discharged with symptomatic treatment.  Verbalizes understanding and is agreeable with plan. Pt is hemodynamically stable & in NAD prior to dc. Low suspicion for PE or ACS given the duration of sxs and presentation of sxs.  Pt mildly tachycardic.  Denies SOB.    Final diagnoses:  URI (upper respiratory infection)    BP 132/85 mmHg  Pulse 106  Temp(Src) 99.1 F (37.3 C) (Oral)  Resp 16  SpO2 98%  LMP 01/27/2015  Breastfeeding? Yes   I personally performed the services described in this documentation, which was scribed in my presence. The recorded information has been reviewed and is  accurate.        Sally Moras, PA-C 02/20/15 2314  Sally Bo, MD 02/21/15 [phone]

## 2015-02-20 NOTE — ED Notes (Signed)
Pa  at bedside. 

## 2015-02-20 NOTE — ED Notes (Addendum)
Called pt in waiting room again no answer.

## 2015-02-20 NOTE — ED Notes (Signed)
Called pt in the waiting room no answer.

## 2015-04-14 ENCOUNTER — Encounter: Payer: Self-pay | Admitting: Internal Medicine

## 2015-04-14 ENCOUNTER — Ambulatory Visit (INDEPENDENT_AMBULATORY_CARE_PROVIDER_SITE_OTHER): Payer: BLUE CROSS/BLUE SHIELD | Admitting: Internal Medicine

## 2015-04-14 VITALS — BP 138/84 | HR 98 | Temp 98.2°F | Resp 18 | Ht 66.5 in | Wt 218.0 lb

## 2015-04-14 DIAGNOSIS — Z0000 Encounter for general adult medical examination without abnormal findings: Secondary | ICD-10-CM

## 2015-04-14 DIAGNOSIS — Z130 Encounter for screening for diseases of the blood and blood-forming organs and certain disorders involving the immune mechanism: Secondary | ICD-10-CM

## 2015-04-14 DIAGNOSIS — Z1389 Encounter for screening for other disorder: Secondary | ICD-10-CM

## 2015-04-14 DIAGNOSIS — R51 Headache: Secondary | ICD-10-CM

## 2015-04-14 DIAGNOSIS — Z13220 Encounter for screening for lipoid disorders: Secondary | ICD-10-CM

## 2015-04-14 DIAGNOSIS — E559 Vitamin D deficiency, unspecified: Secondary | ICD-10-CM | POA: Diagnosis not present

## 2015-04-14 DIAGNOSIS — R519 Headache, unspecified: Secondary | ICD-10-CM

## 2015-04-14 DIAGNOSIS — Z79899 Other long term (current) drug therapy: Secondary | ICD-10-CM

## 2015-04-14 DIAGNOSIS — Z1329 Encounter for screening for other suspected endocrine disorder: Secondary | ICD-10-CM

## 2015-04-14 DIAGNOSIS — Z131 Encounter for screening for diabetes mellitus: Secondary | ICD-10-CM

## 2015-04-14 LAB — CBC WITH DIFFERENTIAL/PLATELET
Basophils Absolute: 0 10*3/uL (ref 0.0–0.1)
Basophils Relative: 0 % (ref 0–1)
EOS PCT: 5 % (ref 0–5)
Eosinophils Absolute: 0.4 10*3/uL (ref 0.0–0.7)
HCT: 35.3 % — ABNORMAL LOW (ref 36.0–46.0)
Hemoglobin: 11.8 g/dL — ABNORMAL LOW (ref 12.0–15.0)
Lymphocytes Relative: 47 % — ABNORMAL HIGH (ref 12–46)
Lymphs Abs: 3.3 10*3/uL (ref 0.7–4.0)
MCH: 29 pg (ref 26.0–34.0)
MCHC: 33.4 g/dL (ref 30.0–36.0)
MCV: 86.7 fL (ref 78.0–100.0)
MPV: 8.7 fL (ref 8.6–12.4)
Monocytes Absolute: 0.6 10*3/uL (ref 0.1–1.0)
Monocytes Relative: 8 % (ref 3–12)
NEUTROS PCT: 40 % — AB (ref 43–77)
Neutro Abs: 2.8 10*3/uL (ref 1.7–7.7)
Platelets: 313 10*3/uL (ref 150–400)
RBC: 4.07 MIL/uL (ref 3.87–5.11)
RDW: 14.3 % (ref 11.5–15.5)
WBC: 7.1 10*3/uL (ref 4.0–10.5)

## 2015-04-14 MED ORDER — ATENOLOL 25 MG PO TABS: 25.0000 mg | ORAL_TABLET | Freq: Every day | ORAL | Status: DC

## 2015-04-14 NOTE — Progress Notes (Signed)
Patient ID: Sally Garner, female   DOB: Sep 21, 1985, 30 y.o.   MRN: [id number]    Annual Screening Comprehensive Examination   This very nice 30 y.o.female presents for complete physical and establishment for new patient physical.  Patient has no major health issues, but was diagnosed with HTN in the past.   She is currently on no medications.     Patient reports no complaints at this time.   She has been having some headaches lately.  The pain is like somebody is squeezing her head.  She reports that she sometimes gets some blurry vision with it.  She does take ibuprofen for the headaches.  She gets some relief from the headaches with this.  Headaches generally vary in length but generally an hour or two.  She does have a lengthy history of headaches.  She does drink caffeine, but not often.  She does have blurry vision with and without the headaches.  She has never had her eyes checked as an adult.  She does tend to strain to read.  There is a family history of migraines.    Finally, patient has history of Vitamin D Deficiency and last vitamin D was No results found for: VD25OH.  Currently on supplementation  She does have some chest pains.  Chest pains started after having her daughter.  SHe reports that the chest pains tend to come and go.  She reports the pain is along the breast bone and is intermittent.  She doesn't get them related to eating.  She does have pain with movement.      No current outpatient prescriptions on file prior to visit.   No current facility-administered medications on file prior to visit.    No Known Allergies  Past Medical History  Diagnosis Date  . Hypertension   . Eczema   . Chlamydia   . Anemia   . GERD (gastroesophageal reflux disease)   . Fibroid     Immunization History  Administered Date(s) Administered  . DTaP 03/13/2013    Past Surgical History  Procedure Laterality Date  . No past surgeries    . Cesarean section N/A 05/25/2013   Procedure: CESAREAN SECTION;  Surgeon: Annalee Genta, DO;  Location: [facility];  Service: Obstetrics;  Laterality: N/A;    Family History  Problem Relation Age of Onset  . Hypertension Maternal Aunt   . Hypertension Maternal Uncle   . Birth defects Brother     polydactyl  . Cancer Paternal Aunt     breast cancer    Social History   Social History  . Marital Status: Single    Spouse Name: N/A  . Number of Children: N/A  . Years of Education: N/A   Occupational History  . Not on file.   Social History Main Topics  . Smoking status: Current Every Day Smoker -- 0.50 packs/day    Types: Cigarettes  . Smokeless tobacco: Never Used  . Alcohol Use: 1.2 oz/week    2 Glasses of wine per week     Comment: occ  . Drug Use: No  . Sexual Activity: Yes    Birth Control/ Protection: None   Other Topics Concern  . Not on file   Social History Narrative    Review of Systems  Constitutional: Positive for malaise/fatigue. Negative for fever, chills and weight loss.  HENT: Negative for congestion, ear pain and sore throat.   Respiratory: Positive for shortness of breath. Negative for cough and wheezing.  Cardiovascular: Positive for chest pain. Negative for palpitations and leg swelling.  Gastrointestinal: Negative for heartburn, nausea, vomiting, diarrhea, constipation, blood in stool and melena.  Genitourinary: Negative.   Skin: Positive for rash.       Chronic eczema  Neurological: Positive for dizziness. Negative for sensory change, loss of consciousness and headaches.  Psychiatric/Behavioral: Negative for depression. The patient is not nervous/anxious and does not have insomnia.       Physical Exam  BP 138/84 mmHg  Pulse 98  Temp(Src) 98.2 F (36.8 C) (Temporal)  Resp 18  Ht 5' 6.5" (1.689 m)  Wt 218 lb (98.884 kg)  BMI 34.66 kg/m2  LMP 03/24/2015  Breastfeeding? No  General Appearance: Well nourished and in no apparent distress. Eyes: PERRLA, EOMs, conjunctiva no  swelling or erythema, normal fundi and vessels. Sinuses: No frontal/maxillary tenderness ENT/Mouth: EACs patent / TMs  nl. Nares clear without erythema, swelling, mucoid exudates. Oral hygiene is good. No erythema, swelling, or exudate. Tongue normal, non-obstructing. Tonsils not swollen or erythematous. Hearing normal.  Neck: Supple, thyroid normal. No bruits, nodes or JVD. Respiratory: Respiratory effort normal.  BS equal and clear bilateral without rales, rhonci, wheezing or stridor. Cardio: Heart sounds are normal with regular rate and rhythm and no murmurs, rubs or gallops. Peripheral pulses are normal and equal bilaterally without edema. No aortic or femoral bruits. Chest: symmetric with normal excursions and percussion. Mild chest tenderness to palaption Abdomen: Flat, soft, with bowl sounds. Nontender, no guarding, rebound, hernias, masses, or organomegaly.  Lymphatics: Non tender without lymphadenopathy.  Genitourinary: Deferred to Obgyn Musculoskeletal: Full ROM all peripheral extremities, joint stability, 5/5 strength, and normal gait. Skin: Warm and dry without rashes, lesions, cyanosis, clubbing or  ecchymosis.  Neuro: Cranial nerves intact, reflexes equal bilaterally. Normal muscle tone, no cerebellar symptoms. Sensation intact.  Pysch: Awake and oriented X 3, normal affect, Insight and Judgment appropriate.   Assessment and Plan    1. Routine general medical examination at a health care facility   2. Screening for diabetes mellitus  - Hemoglobin A1c - Insulin, random  3. Screening for hyperlipidemia  - Lipid panel  4. Screening for deficiency anemia  - Iron and TIBC - Vitamin B12  5. Screening for hematuria or proteinuria  - Urinalysis, Routine w reflex microscopic (not at [facility]) - Microalbumin / creatinine urine ratio  6. Vitamin D deficiency -likely needs some supplementation - VITAMIN D 25 Hydroxy (Vit-D Deficiency, Fractures)  7. Screening for thyroid  disorder  - TSH  8. Medication management  - CBC with Differential/Platelet - BASIC METABOLIC PANEL WITH GFR - Hepatic function panel - Magnesium  9. Generalized headaches - atenolol (TENORMIN) 25 MG tablet; Take 1 tablet (25 mg total) by mouth at bedtime.  Dispense: 60 tablet; Refill: 11  Continue prudent diet as discussed, weight control, regular exercise, and medications. Routine screening labs and tests as requested with regular follow-up as recommended.  Over 40 minutes of exam, counseling, chart review and critical decision making was performed

## 2015-04-14 NOTE — Patient Instructions (Signed)
Tension Headache A tension headache is a feeling of pain, pressure, or aching that is often felt over the front and sides of the head. The pain can be dull, or it can feel tight (constricting). Tension headaches are not normally associated with nausea or vomiting, and they do not get worse with physical activity. Tension headaches can last from 30 minutes to several days. This is the most common type of headache. CAUSES The exact cause of this condition is not known. Tension headaches often begin after stress, anxiety, or depression. Other triggers may include:  Alcohol.  Too much caffeine, or caffeine withdrawal.  Respiratory infections, such as colds, flu, or sinus infections.  Dental problems or teeth clenching.  Fatigue.  Holding your head and neck in the same position for a long period of time, such as while using a computer.  Smoking. SYMPTOMS Symptoms of this condition include:  A feeling of pressure around the head.  Dull, aching head pain.  Pain felt over the front and sides of the head.  Tenderness in the muscles of the head, neck, and shoulders. DIAGNOSIS This condition may be diagnosed based on your symptoms and a physical exam. Tests may be done, such as a CT scan or an MRI of your head. These tests may be done if your symptoms are severe or unusual. TREATMENT This condition may be treated with lifestyle changes and medicines to help relieve symptoms. HOME CARE INSTRUCTIONS Managing Pain  Take over-the-counter and prescription medicines only as told by your health care provider.  Lie down in a dark, quiet room when you have a headache.  If directed, apply ice to the head and neck area:  Put ice in a plastic bag.  Place a towel between your skin and the bag.  Leave the ice on for 20 minutes, 2-3 times per day.  Use a heating pad or a hot shower to apply heat to the head and neck area as told by your health care provider. Eating and Drinking  Eat meals on  a regular schedule.  Limit alcohol use.  Decrease your caffeine intake, or stop using caffeine. General Instructions  Keep all follow-up visits as told by your health care provider. This is important.  Keep a headache journal to help find out what may trigger your headaches. For example, write down:  What you eat and drink.  How much sleep you get.  Any change to your diet or medicines.  Try massage or other relaxation techniques.  Limit stress.  Sit up straight, and avoid tensing your muscles.  Do not use tobacco products, including cigarettes, chewing tobacco, or e-cigarettes. If you need help quitting, ask your health care provider.  Exercise regularly as told by your health care provider.  Get 7-9 hours of sleep, or the amount recommended by your health care provider. SEEK MEDICAL CARE IF:  Your symptoms are not helped by medicine.  You have a headache that is different from what you normally experience.  You have nausea or you vomit.  You have a fever. SEEK IMMEDIATE MEDICAL CARE IF:  Your headache becomes severe.  You have repeated vomiting.  You have a stiff neck.  You have a loss of vision.  You have problems with speech.  You have pain in your eye or ear.  You have muscular weakness or loss of muscle control.  You lose your balance or you have trouble walking.  You feel faint or you pass out.  You have confusion.     This information is not intended to replace advice given to you by your health care provider. Make sure you discuss any questions you have with your health care provider.   Document Released: 02/07/2005 Document Revised: 10/29/2014 Document Reviewed: 06/02/2014 Elsevier Interactive Patient Education 2016 [employer] Inc.  

## 2015-04-15 LAB — VITAMIN B12: VITAMIN B 12: 572 pg/mL (ref 200–1100)

## 2015-04-15 LAB — LIPID PANEL
CHOL/HDL RATIO: 2.7 ratio (ref <=5.0)
Cholesterol: 136 mg/dL (ref 125–200)
HDL: 51 mg/dL (ref >=46)
LDL Cholesterol: 68 mg/dL (ref <130)
TRIGLYCERIDES: 85 mg/dL (ref <150)
VLDL: 17 mg/dL (ref <30)

## 2015-04-15 LAB — BASIC METABOLIC PANEL WITH GFR
BUN: 7 mg/dL (ref 7–25)
CALCIUM: 9 mg/dL (ref 8.6–10.2)
CHLORIDE: 103 mmol/L (ref 98–110)
CO2: 28 mmol/L (ref 20–31)
CREATININE: 0.61 mg/dL (ref 0.50–1.10)
GFR, Est Non African American: >89 mL/min (ref >=60)
Glucose, Bld: 88 mg/dL (ref 65–99)
Potassium: 3.6 mmol/L (ref 3.5–5.3)
SODIUM: 138 mmol/L (ref 135–146)

## 2015-04-15 LAB — IRON AND TIBC
%SAT: 11 % (ref 11–50)
IRON: 48 ug/dL (ref 40–190)
TIBC: 430 ug/dL (ref 250–450)
UIBC: 382 ug/dL (ref 125–400)

## 2015-04-15 LAB — URINALYSIS, ROUTINE W REFLEX MICROSCOPIC
Bilirubin Urine: NEGATIVE
GLUCOSE, UA: NEGATIVE
Hgb urine dipstick: NEGATIVE
KETONES UR: NEGATIVE
Leukocytes, UA: NEGATIVE
Nitrite: NEGATIVE
PROTEIN: NEGATIVE
Specific Gravity, Urine: 1.026 (ref 1.001–1.035)
pH: 7.5 (ref 5.0–8.0)

## 2015-04-15 LAB — HEPATIC FUNCTION PANEL
ALBUMIN: 4 g/dL (ref 3.6–5.1)
ALT: 7 U/L (ref 6–29)
AST: 13 U/L (ref 10–30)
Alkaline Phosphatase: 50 U/L (ref 33–115)
BILIRUBIN TOTAL: 0.2 mg/dL (ref 0.2–1.2)
Bilirubin, Direct: 0.1 mg/dL (ref <=0.2)
Indirect Bilirubin: 0.1 mg/dL — ABNORMAL LOW (ref 0.2–1.2)
Total Protein: 7.3 g/dL (ref 6.1–8.1)

## 2015-04-15 LAB — INSULIN, RANDOM: Insulin: 17.4 u[IU]/mL (ref 2.0–19.6)

## 2015-04-15 LAB — MICROALBUMIN / CREATININE URINE RATIO
CREATININE, URINE: 243 mg/dL (ref 20–320)
Microalb Creat Ratio: 5 mcg/mg creat (ref <30)
Microalb, Ur: 1.2 mg/dL

## 2015-04-15 LAB — MAGNESIUM: MAGNESIUM: 1.8 mg/dL (ref 1.5–2.5)

## 2015-04-15 LAB — VITAMIN D 25 HYDROXY (VIT D DEFICIENCY, FRACTURES): VIT D 25 HYDROXY: 10 ng/mL — AB (ref 30–100)

## 2015-04-15 LAB — HEMOGLOBIN A1C
Hgb A1c MFr Bld: 6.1 % — ABNORMAL HIGH (ref <5.7)
Mean Plasma Glucose: 128 mg/dL — ABNORMAL HIGH (ref <117)

## 2015-04-15 LAB — TSH: TSH: 0.98 m[IU]/L

## 2015-04-22 DIAGNOSIS — K8020 Calculus of gallbladder without cholecystitis without obstruction: Secondary | ICD-10-CM

## 2015-04-22 HISTORY — DX: Calculus of gallbladder without cholecystitis without obstruction: K80.20

## 2015-04-25 ENCOUNTER — Emergency Department (HOSPITAL_COMMUNITY)
Admission: EM | Admit: 2015-04-25 | Discharge: 2015-04-26 | Disposition: A | Discharge status: Home / Self Care | Payer: BLUE CROSS/BLUE SHIELD | Attending: Emergency Medicine | Admitting: Emergency Medicine

## 2015-04-25 ENCOUNTER — Emergency Department (HOSPITAL_COMMUNITY): Payer: BLUE CROSS/BLUE SHIELD

## 2015-04-25 ENCOUNTER — Encounter (HOSPITAL_COMMUNITY): Payer: Self-pay

## 2015-04-25 DIAGNOSIS — Z872 Personal history of diseases of the skin and subcutaneous tissue: Secondary | ICD-10-CM | POA: Diagnosis not present

## 2015-04-25 DIAGNOSIS — Z8719 Personal history of other diseases of the digestive system: Secondary | ICD-10-CM | POA: Diagnosis not present

## 2015-04-25 DIAGNOSIS — Z8619 Personal history of other infectious and parasitic diseases: Secondary | ICD-10-CM | POA: Insufficient documentation

## 2015-04-25 DIAGNOSIS — R0789 Other chest pain: Secondary | ICD-10-CM

## 2015-04-25 DIAGNOSIS — Z862 Personal history of diseases of the blood and blood-forming organs and certain disorders involving the immune mechanism: Secondary | ICD-10-CM | POA: Insufficient documentation

## 2015-04-25 DIAGNOSIS — R079 Chest pain, unspecified: Secondary | ICD-10-CM | POA: Diagnosis present

## 2015-04-25 DIAGNOSIS — K8050 Calculus of bile duct without cholangitis or cholecystitis without obstruction: Secondary | ICD-10-CM | POA: Insufficient documentation

## 2015-04-25 DIAGNOSIS — M545 Low back pain: Secondary | ICD-10-CM | POA: Diagnosis not present

## 2015-04-25 DIAGNOSIS — K8020 Calculus of gallbladder without cholecystitis without obstruction: Secondary | ICD-10-CM | POA: Diagnosis not present

## 2015-04-25 DIAGNOSIS — F17210 Nicotine dependence, cigarettes, uncomplicated: Secondary | ICD-10-CM | POA: Insufficient documentation

## 2015-04-25 DIAGNOSIS — Z86018 Personal history of other benign neoplasm: Secondary | ICD-10-CM | POA: Diagnosis not present

## 2015-04-25 DIAGNOSIS — I10 Essential (primary) hypertension: Secondary | ICD-10-CM | POA: Diagnosis not present

## 2015-04-25 LAB — BASIC METABOLIC PANEL
ANION GAP: 13 (ref 5–15)
BUN: 9 mg/dL (ref 6–20)
CALCIUM: 9.5 mg/dL (ref 8.9–10.3)
CO2: 26 mmol/L (ref 22–32)
Chloride: 101 mmol/L (ref 101–111)
Creatinine, Ser: 0.76 mg/dL (ref 0.44–1.00)
Glucose, Bld: 109 mg/dL — ABNORMAL HIGH (ref 65–99)
POTASSIUM: 3.8 mmol/L (ref 3.5–5.1)
Sodium: 140 mmol/L (ref 135–145)

## 2015-04-25 LAB — CBC
HEMATOCRIT: 35.6 % — AB (ref 36.0–46.0)
HEMOGLOBIN: 11.8 g/dL — AB (ref 12.0–15.0)
MCH: 28.5 pg (ref 26.0–34.0)
MCHC: 33.1 g/dL (ref 30.0–36.0)
MCV: 86 fL (ref 78.0–100.0)
Platelets: 326 10*3/uL (ref 150–400)
RBC: 4.14 MIL/uL (ref 3.87–5.11)
RDW: 12.8 % (ref 11.5–15.5)
WBC: 8.3 10*3/uL (ref 4.0–10.5)

## 2015-04-25 LAB — HEPATIC FUNCTION PANEL
ALBUMIN: 3.7 g/dL (ref 3.5–5.0)
ALT: 12 U/L — ABNORMAL LOW (ref 14–54)
AST: 19 U/L (ref 15–41)
Alkaline Phosphatase: 65 U/L (ref 38–126)
Bilirubin, Direct: <0.1 mg/dL — ABNORMAL LOW (ref 0.1–0.5)
Total Bilirubin: 0.2 mg/dL — ABNORMAL LOW (ref 0.3–1.2)
Total Protein: 7.7 g/dL (ref 6.5–8.1)

## 2015-04-25 LAB — I-STAT TROPONIN, ED: TROPONIN I, POC: 0 ng/mL (ref 0.00–0.08)

## 2015-04-25 LAB — LIPASE, BLOOD: Lipase: 32 U/L (ref 11–51)

## 2015-04-25 MED ORDER — MORPHINE SULFATE (PF) 4 MG/ML IV SOLN
4.0000 mg | Freq: Once | INTRAVENOUS | Status: AC
  Administered 2015-04-25: 4 mg via INTRAVENOUS
  Filled 2015-04-25: qty 1

## 2015-04-25 MED ORDER — ONDANSETRON HCL 4 MG/2ML IJ SOLN
4.0000 mg | Freq: Once | INTRAMUSCULAR | Status: AC
  Administered 2015-04-25: 4 mg via INTRAVENOUS
  Filled 2015-04-25: qty 2

## 2015-04-25 NOTE — ED Notes (Signed)
Pt reports onset 1 hour PTA substernal chest pain.  No recent cough/cold symptoms.  No shortness of breath, nausea, diaphoresis, dizziness.

## 2015-04-25 NOTE — ED Provider Notes (Signed)
CSN: [id number]     Arrival date & time 04/25/15  2202 History  By signing my name below, I, Rowan Blase, attest that this documentation has been prepared under the direction and in the presence of Lajean Saver, MD . Electronically Signed: Rowan Blase, Scribe. 04/25/2015. 11:28 PM.   Chief Complaint  Patient presents with  . Chest Pain   The history is provided by the patient. No language interpreter was used.   HPI Comments:  Sally Garner is a 30 y.o. female with PMHx of HTN and GERD who presents to the Emergency Department complaining of gradual onset, sharp chest pain beginning an hour PTA while watching TV. She states her pain started as cramps in her stomach, then radiated to her chest and central back. She reports associated burning pain similar to heartburn. Pt notes eating ~ 1 hr prior to onset. No alleviating factors noted or treatments attempted PTA. Pt states she has had similar symptoms previously with no dx. Denies h/o gallstones, nausea, vomiting, cough, congestion, fever, vaginal bleeding, vaginal discharge, or difficulty breathing.  Past Medical History  Diagnosis Date  . Hypertension   . Eczema   . Chlamydia   . Anemia   . GERD (gastroesophageal reflux disease)   . Fibroid    Past Surgical History  Procedure Laterality Date  . No past surgeries    . Cesarean section N/A 05/25/2013    Procedure: CESAREAN SECTION;  Surgeon: Annalee Genta, DO;  Location: [facility];  Service: Obstetrics;  Laterality: N/A;   Family History  Problem Relation Age of Onset  . Hypertension Maternal Aunt   . Hypertension Maternal Uncle   . Birth defects Brother     polydactyl  . Cancer Paternal Aunt     breast cancer   Social History  Substance Use Topics  . Smoking status: Current Every Day Smoker -- 0.50 packs/day    Types: Cigarettes  . Smokeless tobacco: Never Used  . Alcohol Use: 1.2 oz/week    2 Glasses of wine per week     Comment: occ   OB History    Gravida Para  Term Preterm AB TAB SAB Ectopic Multiple Living   1 1 1       1      Review of Systems  Constitutional: Negative for fever.  HENT: Negative for congestion.   Respiratory: Negative for cough and shortness of breath.   Cardiovascular: Positive for chest pain. Negative for leg swelling.  Gastrointestinal: Positive for abdominal pain. Negative for nausea and vomiting.  Genitourinary: Negative for vaginal bleeding and vaginal discharge.  Musculoskeletal: Positive for back pain.  All other systems reviewed and are negative.  Allergies  Review of patient's allergies indicates no known allergies.  Home Medications   Prior to Admission medications   Medication Sig Start Date End Date Taking? Authorizing Provider  atenolol (TENORMIN) 25 MG tablet Take 1 tablet (25 mg total) by mouth at bedtime. 04/14/15 04/13/16 Yes Courtney Forcucci, PA-C   BP 143/109 mmHg  Pulse 88  Temp(Src) 98 F (36.7 C) (Oral)  Resp 15  Ht 5\' 4"  (1.626 m)  Wt 221 lb 4.8 oz (100.381 kg)  BMI 37.97 kg/m2  SpO2 100%  LMP 03/24/2015 Physical Exam  Constitutional: She is oriented to person, place, and time. She appears well-developed and well-nourished. No distress.  HENT:  Head: Normocephalic and atraumatic.  Right Ear: Hearing normal.  Left Ear: Hearing normal.  Nose: Nose normal.  Mouth/Throat: Oropharynx is clear and moist and  mucous membranes are normal.  Eyes: Conjunctivae are normal. No scleral icterus.  Neck: Normal range of motion. Neck supple.  Cardiovascular: Normal rate, regular rhythm, S1 normal, S2 normal, normal heart sounds and intact distal pulses.  Exam reveals no gallop and no friction rub.   No murmur heard. Pulmonary/Chest: Effort normal and breath sounds normal. No respiratory distress. She exhibits no tenderness.  Abdominal: Soft. Normal appearance and bowel sounds are normal. She exhibits no mass. There is no hepatosplenomegaly. There is tenderness in the epigastric area. There is no rebound,  no guarding, no tenderness at McBurney's point and negative Murphy's sign. No hernia.  Epigastric/upper abd mild tenderness. No rebound or guarding.   Genitourinary:  No cva tenderness  Musculoskeletal: Normal range of motion. She exhibits no edema or tenderness.  Neurological: She is alert and oriented to person, place, and time. She has normal strength. No cranial nerve deficit or sensory deficit. Coordination normal. GCS eye subscore is 4. GCS verbal subscore is 5. GCS motor subscore is 6.  Skin: Skin is warm, dry and intact. No rash noted. She is not diaphoretic. No cyanosis.  Psychiatric: She has a normal mood and affect. Her speech is normal and behavior is normal.  Nursing note and vitals reviewed.  ED Course  Procedures  DIAGNOSTIC STUDIES:  Oxygen Saturation is 100% on RA, normal by my interpretation.    COORDINATION OF CARE:  11:12 PM Will administer pain medication. Will order abdominal US, chest x-ray, and lab work. Discussed treatment plan with pt at bedside and pt agreed to plan.  Labs Review   Results for orders placed or performed during the hospital encounter of [id number]  Basic metabolic panel  Result Value Ref Range   Sodium 140 135 - 145 mmol/L   Potassium 3.8 3.5 - 5.1 mmol/L   Chloride 101 101 - 111 mmol/L   CO2 26 22 - 32 mmol/L   Glucose, Bld 109 (H) 65 - 99 mg/dL   BUN 9 6 - 20 mg/dL   Creatinine, Ser 0.76 0.44 - 1.00 mg/dL   Calcium 9.5 8.9 - 10.3 mg/dL   GFR calc non Af Amer >60 >60 mL/min   GFR calc Af Amer >60 >60 mL/min   Anion gap 13 5 - 15  CBC  Result Value Ref Range   WBC 8.3 4.0 - 10.5 K/uL   RBC 4.14 3.87 - 5.11 MIL/uL   Hemoglobin 11.8 (L) 12.0 - 15.0 g/dL   HCT 35.6 (L) 36.0 - 46.0 %   MCV 86.0 78.0 - 100.0 fL   MCH 28.5 26.0 - 34.0 pg   MCHC 33.1 30.0 - 36.0 g/dL   RDW 12.8 11.5 - 15.5 %   Platelets 326 150 - 400 K/uL  Hepatic function panel  Result Value Ref Range   Total Protein 7.7 6.5 - 8.1 g/dL   Albumin 3.7 3.5 - 5.0 g/dL    AST 19 15 - 41 U/L   ALT 12 (L) 14 - 54 U/L   Alkaline Phosphatase 65 38 - 126 U/L   Total Bilirubin 0.2 (L) 0.3 - 1.2 mg/dL   Bilirubin, Direct <0.1 (L) 0.1 - 0.5 mg/dL   Indirect Bilirubin NOT CALCULATED 0.3 - 0.9 mg/dL  Lipase, blood  Result Value Ref Range   Lipase 32 11 - 51 U/L  I-stat troponin, ED (not at [facility], [facility])  Result Value Ref Range   Troponin i, poc 0.00 0.00 - 0.08 ng/mL   Comment 3  Dg Chest 2 View  04/25/2015  CLINICAL DATA:  Chest pain.  Hypertension EXAM: CHEST  2 VIEW COMPARISON:  02/20/2015 FINDINGS: The heart size and mediastinal contours are within normal limits. Both lungs are clear. The visualized skeletal structures are unremarkable. IMPRESSION: No active cardiopulmonary disease. Electronically Signed   By: Kerby Moors M.D.   On: 04/25/2015 23:20   US Abdomen Complete  04/26/2015  CLINICAL DATA:  Acute onset of generalized chest pain. Initial encounter. EXAM: ABDOMEN ULTRASOUND COMPLETE COMPARISON:  Pelvic ultrasound performed 09/27/2012 FINDINGS: Gallbladder: Multiple mobile stones are seen, measuring up to 7 mm in size. No gallbladder wall thickening or pericholecystic fluid is seen. No ultrasonographic Murphy's sign is elicited. Common bile duct: Diameter: 0.3 cm, within normal limits in caliber. Liver: No focal lesion identified. The mildly coarsened echotexture of the liver raises question for mild fatty infiltration. IVC: Not seen due to overlying bowel gas. Pancreas: Not seen due to overlying bowel gas. Spleen: Size and appearance within normal limits. Right Kidney: Length: 12.1 cm. Echogenicity within normal limits. No mass or hydronephrosis visualized. Left Kidney: Length: 12.0 cm. Echogenicity within normal limits. No mass or hydronephrosis visualized. Abdominal aorta: No aneurysm visualized. Not well characterized distally due to overlying bowel gas. Other findings: None. IMPRESSION: 1. No acute abnormality seen within the abdomen. 2.  Cholelithiasis.  Gallbladder otherwise unremarkable. 3. Question of mild fatty infiltration. Electronically Signed   By: Garald Balding M.D.   On: 04/26/2015 00:41      I have personally reviewed and evaluated these images and lab results as part of my medical decision-making.   EKG Interpretation   Date/Time:  Saturday April 25 2015 22:13:42 EST Ventricular Rate:  88 PR Interval:  166 QRS Duration: 84 QT Interval:  368 QTC Calculation: 445 R Axis:   72 Text Interpretation:  Normal sinus rhythm Normal ECG No significant change  since last tracing Confirmed by Ashok Cordia  MD, Lennette Bihari ([id number]) on 04/25/2015  11:00:57 PM      MDM   I personally performed the services described in this documentation, which was scribed in my presence. The recorded information has been reviewed and considered. Lajean Saver, MD  Iv ns. Labs.  Morphine iv. zofran iv. Pt has ride, does not have to drive.  Recheck pain improved. abd soft nt.  Hx, exam, u/s, c/w biliary colic, gallstones.  Pain resolved.  Pt currently appears stable for d/c.  Will provide referral to close general surgery follow up.  Return precautions provided.     Lajean Saver, MD 04/26/15 0100

## 2015-04-26 ENCOUNTER — Encounter (HOSPITAL_COMMUNITY): Payer: Self-pay | Admitting: *Deleted

## 2015-04-26 ENCOUNTER — Emergency Department (HOSPITAL_COMMUNITY)
Admission: EM | Admit: 2015-04-26 | Discharge: 2015-04-26 | Disposition: A | Discharge status: Home / Self Care | Payer: BLUE CROSS/BLUE SHIELD | Source: Home / Self Care | Attending: Emergency Medicine | Admitting: Emergency Medicine

## 2015-04-26 ENCOUNTER — Inpatient Hospital Stay (EMERGENCY_DEPARTMENT_HOSPITAL)
Admission: AD | Admit: 2015-04-26 | Discharge: 2015-04-26 | Disposition: A | Discharge status: Home / Self Care | Payer: BLUE CROSS/BLUE SHIELD | Source: Ambulatory Visit | Attending: Obstetrics & Gynecology | Admitting: Obstetrics & Gynecology

## 2015-04-26 DIAGNOSIS — Z8619 Personal history of other infectious and parasitic diseases: Secondary | ICD-10-CM | POA: Insufficient documentation

## 2015-04-26 DIAGNOSIS — Z86018 Personal history of other benign neoplasm: Secondary | ICD-10-CM

## 2015-04-26 DIAGNOSIS — K8020 Calculus of gallbladder without cholecystitis without obstruction: Secondary | ICD-10-CM

## 2015-04-26 DIAGNOSIS — F17210 Nicotine dependence, cigarettes, uncomplicated: Secondary | ICD-10-CM

## 2015-04-26 DIAGNOSIS — Z872 Personal history of diseases of the skin and subcutaneous tissue: Secondary | ICD-10-CM | POA: Insufficient documentation

## 2015-04-26 DIAGNOSIS — Z862 Personal history of diseases of the blood and blood-forming organs and certain disorders involving the immune mechanism: Secondary | ICD-10-CM | POA: Insufficient documentation

## 2015-04-26 DIAGNOSIS — I10 Essential (primary) hypertension: Secondary | ICD-10-CM | POA: Insufficient documentation

## 2015-04-26 LAB — CBC WITH DIFFERENTIAL/PLATELET
Basophils Absolute: 0 10*3/uL (ref 0.0–0.1)
Basophils Relative: 0 %
EOS PCT: 0 %
Eosinophils Absolute: 0 10*3/uL (ref 0.0–0.7)
HCT: 36.4 % (ref 36.0–46.0)
Hemoglobin: 12 g/dL (ref 12.0–15.0)
LYMPHS ABS: 1.6 10*3/uL (ref 0.7–4.0)
LYMPHS PCT: 19 %
MCH: 28.5 pg (ref 26.0–34.0)
MCHC: 33 g/dL (ref 30.0–36.0)
MCV: 86.5 fL (ref 78.0–100.0)
MONO ABS: 0.2 10*3/uL (ref 0.1–1.0)
MONOS PCT: 3 %
Neutro Abs: 6.5 10*3/uL (ref 1.7–7.7)
Neutrophils Relative %: 78 %
PLATELETS: 286 10*3/uL (ref 150–400)
RBC: 4.21 MIL/uL (ref 3.87–5.11)
RDW: 12.8 % (ref 11.5–15.5)
WBC: 8.4 10*3/uL (ref 4.0–10.5)

## 2015-04-26 LAB — I-STAT CHEM 8, ED
BUN: 6 mg/dL (ref 6–20)
CHLORIDE: 102 mmol/L (ref 101–111)
CREATININE: 0.6 mg/dL (ref 0.44–1.00)
Calcium, Ion: 1.17 mmol/L (ref 1.12–1.23)
GLUCOSE: 122 mg/dL — AB (ref 65–99)
HCT: 40 % (ref 36.0–46.0)
Hemoglobin: 13.6 g/dL (ref 12.0–15.0)
POTASSIUM: 3.9 mmol/L (ref 3.5–5.1)
Sodium: 139 mmol/L (ref 135–145)
TCO2: 24 mmol/L (ref 0–100)

## 2015-04-26 LAB — POCT PREGNANCY, URINE
Preg Test, Ur: NEGATIVE
Preg Test, Ur: NEGATIVE

## 2015-04-26 MED ORDER — PROMETHAZINE HCL 12.5 MG PO TABS: 12.5000 mg | ORAL_TABLET | Freq: Four times a day (QID) | ORAL | Status: DC | PRN

## 2015-04-26 MED ORDER — NAPROXEN 500 MG PO TABS: 500.0000 mg | ORAL_TABLET | Freq: Two times a day (BID) | ORAL | Status: DC

## 2015-04-26 MED ORDER — KETOROLAC TROMETHAMINE 60 MG/2ML IM SOLN
30.0000 mg | Freq: Once | INTRAMUSCULAR | Status: AC
  Administered 2015-04-26: 30 mg via INTRAMUSCULAR
  Filled 2015-04-26: qty 2

## 2015-04-26 MED ORDER — ONDANSETRON 4 MG PO TBDP
ORAL_TABLET | ORAL | Status: AC
  Filled 2015-04-26: qty 1

## 2015-04-26 MED ORDER — ONDANSETRON 4 MG PO TBDP
4.0000 mg | ORAL_TABLET | Freq: Once | ORAL | Status: AC | PRN
  Administered 2015-04-26: 4 mg via ORAL

## 2015-04-26 MED ORDER — HYDROCODONE-ACETAMINOPHEN 5-325 MG PO TABS: 1.0000 | ORAL_TABLET | Freq: Four times a day (QID) | ORAL | Status: DC | PRN

## 2015-04-26 MED ORDER — MORPHINE SULFATE (PF) 2 MG/ML IV SOLN
2.0000 mg | Freq: Once | INTRAVENOUS | Status: AC
  Administered 2015-04-26: 2 mg via INTRAMUSCULAR
  Filled 2015-04-26: qty 1

## 2015-04-26 MED ORDER — HYDROMORPHONE HCL 1 MG/ML IJ SOLN
1.0000 mg | Freq: Once | INTRAMUSCULAR | Status: AC
  Administered 2015-04-26: 1 mg via INTRAMUSCULAR
  Filled 2015-04-26: qty 1

## 2015-04-26 MED ORDER — PROMETHAZINE HCL 25 MG PO TABS
12.5000 mg | ORAL_TABLET | Freq: Once | ORAL | Status: AC
  Administered 2015-04-26: 12.5 mg via ORAL
  Filled 2015-04-26: qty 1

## 2015-04-26 MED ORDER — ONDANSETRON HCL 4 MG PO TABS: 4.0000 mg | ORAL_TABLET | Freq: Three times a day (TID) | ORAL | Status: DC | PRN

## 2015-04-26 NOTE — Discharge Instructions (Signed)
1. Medications: zofran as needed for nausea/vomiting, continue pain medication as needed, continue usual home medications 2. Treatment: rest, drink plenty of fluids, avoid greasy foods/foods high in fat.  3. Follow Up: Please call the general surgeon first thing in the morning to schedule your follow up appointment. Please return to the ER for new or worsening symptoms, any additional concerns.

## 2015-04-26 NOTE — MAU Provider Note (Signed)
History     CSN: [id number]  Arrival date and time: 04/26/15 1543   First Provider Initiated Contact with Patient 04/26/15 1620      Chief Complaint  Patient presents with  . Back Pain  . Abdominal Pain  . Emesis   HPI Ms. Courtne Sible is a 30 y.o. G1P1001 who presents to MAU today with complaint of RUQ abdominal pain. She was diagnosed with gallstones yesterday. She was given Vicodin for pain and advised to call Surgery on Monday. She returned to [facility] this afternoon with continued pain. She had taken Vicodin once and vomited "green stuff" so she did not take again. They gave her Morphine and Toradol at [facility] today. She still rates pain at 10/10 now. She was given Rx for Zofran. She states when given ODT Zofran at [facility] today she vomited. Repeat labs today performed at [facility] were unchanged. Patient denies vomiting since she took the Zofran. She denies fever.   OB History    Gravida Para Term Preterm AB TAB SAB Ectopic Multiple Living   1 1 1       1       Past Medical History  Diagnosis Date  . Hypertension   . Eczema   . Chlamydia   . Anemia   . GERD (gastroesophageal reflux disease)   . Fibroid   . Gall bladder stones 2017    Past Surgical History  Procedure Laterality Date  . No past surgeries    . Cesarean section N/A 05/25/2013    Procedure: CESAREAN SECTION;  Surgeon: Annalee Genta, DO;  Location: [facility];  Service: Obstetrics;  Laterality: N/A;    Family History  Problem Relation Age of Onset  . Hypertension Maternal Aunt   . Hypertension Maternal Uncle   . Birth defects Brother     polydactyl  . Cancer Paternal Aunt     breast cancer    Social History  Substance Use Topics  . Smoking status: Current Every Day Smoker -- 0.50 packs/day    Types: Cigarettes  . Smokeless tobacco: Never Used  . Alcohol Use: 1.2 oz/week    2 Glasses of wine per week     Comment: occ    Allergies: No Known Allergies  Prescriptions prior to admission  Medication Sig  Dispense Refill Last Dose  . atenolol (TENORMIN) 25 MG tablet Take 1 tablet (25 mg total) by mouth at bedtime. 60 tablet 11 Past Week at Unknown time  . HYDROcodone-acetaminophen (NORCO/VICODIN) 5-325 MG tablet Take 1-2 tablets by mouth every 6 (six) hours as needed for moderate pain. 15 tablet 0 Past Week at Unknown time  . naproxen (NAPROSYN) 500 MG tablet Take 1 tablet (500 mg total) by mouth 2 (two) times daily. 30 tablet 0 Past Week at Unknown time  . ondansetron (ZOFRAN) 4 MG tablet Take 1 tablet (4 mg total) by mouth every 8 (eight) hours as needed for nausea or vomiting. 12 tablet 0 Past Week at Unknown time    Review of Systems  Constitutional: Negative for fever and malaise/fatigue.  Gastrointestinal: Positive for nausea, vomiting and abdominal pain. Negative for diarrhea and constipation.  Genitourinary:       + vaginal bleeding   Physical Exam   Blood pressure 153/93, pulse 84, temperature 98.2 F (36.8 C), temperature source Oral, resp. rate 18, last menstrual period 04/26/2015, SpO2 100 %, not currently breastfeeding.  Physical Exam  Nursing note and vitals reviewed. Constitutional: She is oriented to person, place, and time. She  appears well-developed and well-nourished. No distress.  HENT:  Head: Normocephalic and atraumatic.  Cardiovascular: Normal rate.   Respiratory: Effort normal.  GI: Soft. She exhibits no distension and no mass. There is no tenderness. There is no rebound and no guarding.  Neurological: She is alert and oriented to person, place, and time.  Skin: Skin is warm and dry. No erythema.  Psychiatric: She has a normal mood and affect.    Results for orders placed or performed during the hospital encounter of 04/26/15 (from the past 24 hour(s))  Pregnancy, urine POC     Status: None   Collection Time: 04/26/15  5:00 PM  Result Value Ref Range   Preg Test, Ur NEGATIVE NEGATIVE  Pregnancy, urine POC     Status: None   Collection Time: 04/26/15  5:07  PM  Result Value Ref Range   Preg Test, Ur NEGATIVE NEGATIVE    MAU Course  Procedures None  MDM Reviewed notes and results from [place] visits recently UPT - negative Patient advised that we cannot change her follow-up plan with surgery and that we do not have non-GYN surgeons available at this facility 1 mg Dilaudid and 12.5 mg Phenergan given for nausea and pain.  Patient reports improvement in pain and no emesis while in MAU Assessment and Plan  A: Gallstones  P: Discharge home in stable to condition Rx for Phenergan given  Patient advised to continue Vicodin previously prescribed for pain Warning signs for worsening condition discussed Patient advised to follow-up with [place] surgery tomorrow for management Patient may return to MAU as needed or if her condition were to change or worsen, however discussed that for non-GYN issues she should present to [facility] or [facility] for worsening symptoms   Luvenia Redden, PA-C  04/26/2015, 5:38 PM

## 2015-04-26 NOTE — ED Provider Notes (Signed)
CSN: [id number]     Arrival date & time 04/26/15  1237 History   First MD Initiated Contact with Patient 04/26/15 1334     Chief Complaint  Patient presents with  . Emesis  . Abdominal Pain     (Consider location/radiation/quality/duration/timing/severity/associated sxs/prior Treatment) Patient is a 30 y.o. female presenting with vomiting and abdominal pain. The history is provided by the patient and medical records. No language interpreter was used.  Emesis Associated symptoms: abdominal pain   Associated symptoms: no chills, no diarrhea and no headaches   Abdominal Pain Associated symptoms: nausea and vomiting   Associated symptoms: no chills, no constipation, no cough, no diarrhea, no dysuria, no fever and no shortness of breath    Sally Garner is a 30 y.o. female  with a PMH of HTN, GERD, eczema who presents to the Emergency Department complaining of persistent, crampy right upper quadrant pain with radiation to epigastrium. Associated symptoms include nausea and 4 episodes of nonbloody emesis which began today. Denies fever, chest pain, shortness of breath. Patient was seen for the same yesterday where labs and imaging were obtained. Labs were normal, ultrasound revealed gallstones. Patient was given pain medication and referral for general surgery. Patient states she was not throwing up yesterday when she was seen. She did get pain medication filled, however took 2 Percocet with no relief.    Past Medical History  Diagnosis Date  . Hypertension   . Eczema   . Chlamydia   . Anemia   . GERD (gastroesophageal reflux disease)   . Fibroid    Past Surgical History  Procedure Laterality Date  . No past surgeries    . Cesarean section N/A 05/25/2013    Procedure: CESAREAN SECTION;  Surgeon: Annalee Genta, DO;  Location: [facility];  Service: Obstetrics;  Laterality: N/A;   Family History  Problem Relation Age of Onset  . Hypertension Maternal Aunt   . Hypertension Maternal Uncle    . Birth defects Brother     polydactyl  . Cancer Paternal Aunt     breast cancer   Social History  Substance Use Topics  . Smoking status: Current Every Day Smoker -- 0.50 packs/day    Types: Cigarettes  . Smokeless tobacco: Never Used  . Alcohol Use: 1.2 oz/week    2 Glasses of wine per week     Comment: occ   OB History    Gravida Para Term Preterm AB TAB SAB Ectopic Multiple Living   1 1 1       1      Review of Systems  Constitutional: Negative for fever and chills.  HENT: Negative for congestion.   Eyes: Negative for visual disturbance.  Respiratory: Negative for cough and shortness of breath.   Cardiovascular: Negative.   Gastrointestinal: Positive for nausea, vomiting and abdominal pain. Negative for diarrhea and constipation.  Genitourinary: Negative for dysuria.  Musculoskeletal: Negative for back pain and neck pain.  Skin: Negative for rash.  Neurological: Negative for dizziness and headaches.      Allergies  Review of patient's allergies indicates no known allergies.  Home Medications   Prior to Admission medications   Medication Sig Start Date End Date Taking? Authorizing Provider  atenolol (TENORMIN) 25 MG tablet Take 1 tablet (25 mg total) by mouth at bedtime. 04/14/15 04/13/16  Courtney Forcucci, PA-C  HYDROcodone-acetaminophen (NORCO/VICODIN) 5-325 MG tablet Take 1-2 tablets by mouth every 6 (six) hours as needed for moderate pain. 04/26/15   Lajean Saver, MD  naproxen (NAPROSYN) 500 MG tablet Take 1 tablet (500 mg total) by mouth 2 (two) times daily. 04/26/15   Ozella Almond Ward, PA-C  ondansetron (ZOFRAN) 4 MG tablet Take 1 tablet (4 mg total) by mouth every 8 (eight) hours as needed for nausea or vomiting. 04/26/15   Jaime Pilcher Ward, PA-C   BP 140/102 mmHg  Pulse 87  Temp(Src) 98.4 F (36.9 C)  Resp 18  SpO2 97%  LMP 03/24/2015 Physical Exam  Constitutional: She is oriented to person, place, and time. She appears well-developed and well-nourished.   Alert and in no acute distress  HENT:  Head: Normocephalic and atraumatic.  Cardiovascular: Normal rate, regular rhythm and normal heart sounds.  Exam reveals no gallop and no friction rub.   No murmur heard. Pulmonary/Chest: Effort normal and breath sounds normal. No respiratory distress. She has no wheezes. She has no rales. She exhibits no tenderness.  Abdominal: Soft. Bowel sounds are normal. She exhibits no distension. There is no rebound and no guarding.  Tenderness to palpation of right upper quadrant and epigastrium. Negative Murphy's.   Musculoskeletal: She exhibits no edema.  Neurological: She is alert and oriented to person, place, and time.  Skin: Skin is warm and dry. No rash noted.  Psychiatric: She has a normal mood and affect. Her behavior is normal. Judgment and thought content normal.  Nursing note and vitals reviewed.   ED Course  Procedures (including critical care time) Labs Review Labs Reviewed  I-STAT CHEM 8, ED - Abnormal; Notable for the following:    Glucose, Bld 122 (*)    All other components within normal limits  CBC WITH DIFFERENTIAL/PLATELET    Imaging Review Dg Chest 2 View  04/25/2015  CLINICAL DATA:  Chest pain.  Hypertension EXAM: CHEST  2 VIEW COMPARISON:  02/20/2015 FINDINGS: The heart size and mediastinal contours are within normal limits. Both lungs are clear. The visualized skeletal structures are unremarkable. IMPRESSION: No active cardiopulmonary disease. Electronically Signed   By: Kerby Moors M.D.   On: 04/25/2015 23:20   US Abdomen Complete  04/26/2015  CLINICAL DATA:  Acute onset of generalized chest pain. Initial encounter. EXAM: ABDOMEN ULTRASOUND COMPLETE COMPARISON:  Pelvic ultrasound performed 09/27/2012 FINDINGS: Gallbladder: Multiple mobile stones are seen, measuring up to 7 mm in size. No gallbladder wall thickening or pericholecystic fluid is seen. No ultrasonographic Murphy's sign is elicited. Common bile duct: Diameter: 0.3 cm,  within normal limits in caliber. Liver: No focal lesion identified. The mildly coarsened echotexture of the liver raises question for mild fatty infiltration. IVC: Not seen due to overlying bowel gas. Pancreas: Not seen due to overlying bowel gas. Spleen: Size and appearance within normal limits. Right Kidney: Length: 12.1 cm. Echogenicity within normal limits. No mass or hydronephrosis visualized. Left Kidney: Length: 12.0 cm. Echogenicity within normal limits. No mass or hydronephrosis visualized. Abdominal aorta: No aneurysm visualized. Not well characterized distally due to overlying bowel gas. Other findings: None. IMPRESSION: 1. No acute abnormality seen within the abdomen. 2. Cholelithiasis.  Gallbladder otherwise unremarkable. 3. Question of mild fatty infiltration. Electronically Signed   By: Garald Balding M.D.   On: 04/26/2015 00:41   I have personally reviewed and evaluated these images and lab results as part of my medical decision-making.   EKG Interpretation None      MDM   Final diagnoses:  Gallstones   Sally Garner presents with right upper quadrant pain associated with nausea and 4 episodes of emesis. Patient seen in ED  for similar yesterday, and diagnosed with gallstones. She was given pain medication at discharge along with referral for general surgery. Patient states she took two pain meds with no relief. Patient admits to 4 episodes of emesis today, but this was not present on evaluation yesterday. Will obtain i-STAT to assess hydration status and CBC to look for changes in white count. Given ultrasound yesterday and complete abdominal lab workup yesterday, further labs and imaging are not warranted at this time. Patient is afebrile, normal heart rate,  with soft abdomen on exam. Tenderness to right upper quadrant and epigastrium, but no Murphy's sign present. 2mg  IM morphine given for pain control.   CBC and chem 8 reassuring. Kidney function within defined limits, patient  still complaining of pain, therefore we will give Toradol for additional pain relief.  Patient is nontoxic, nonseptic appearing, in no apparent distress. Patient's pain was managed in ED. Patient states 10/10 pain at discharge, however she is ambulating around ED without difficulty. Labs and vitals reviewed again before discharge. Patient does not meet the SIRS or Sepsis criteria. On repeat exam patient does not have a surgical abdomin and there are no peritoneal signs. No indication of appendicitis, bowel obstruction, bowel perforation, cholecystitis, diverticulitis. Patient discharged home with zofran as needed for n/v and naproxen. Continue pain medication she was given yesterday as needed. Patient informed to call to schedule general surgery appointment first thing in the morning. Home care instructions and return precautions given. All questions answered.   [facility] Ward, PA-C 04/26/15 [place], MD 04/26/15 [phone]

## 2015-04-26 NOTE — MAU Note (Signed)
Urine sent to lab 

## 2015-04-26 NOTE — Discharge Instructions (Signed)
It was our pleasure to provide your ER care today - we hope that you feel better.  Rest. Drink plenty of fluids.   Your ultrasound shows gallstones. Follow up with general surgery in the next 1-2 weeks - call office Monday to arrange appointment.  Take motrin or aleve as need for pain. You may also take hydrocodone as need for pain. No driving when taking hydrocodone. Also, do not take tylenol or acetaminophen containing medication when taking hydrocodone.  Return to ER if worse, new symptoms, fevers, severe pain, persistent vomiting, other concern.  You were given pain medication in the ER to help you feel better - no driving for the next 4 hours.     Biliary Colic Biliary colic is a pain in the upper abdomen. The pain:  Is usually felt on the right side of the abdomen, but it may also be felt in the center of the abdomen, just below the breastbone (sternum).  May spread back toward the right shoulder blade.  May be steady or irregular.  May be accompanied by nausea and vomiting. Most of the time, the pain goes away in 1-5 hours. After the most intense pain passes, the abdomen may continue to ache mildly for about 24 hours. Biliary colic is caused by a blockage in the bile duct. The bile duct is a pathway that carries bile--a liquid that helps to digest fats--from the gallbladder to the small intestine. Biliary colic usually occurs after eating, when the digestive system demands bile. The pain develops when muscle cells contract forcefully to try to move the blockage so that bile can get by. HOME CARE INSTRUCTIONS  Take medicines only as directed by your health care provider.  Drink enough fluid to keep your urine clear or pale yellow.  Avoid fatty, greasy, and fried foods. These kinds of foods increase your body's demand for bile.  Avoid any foods that make your pain worse.  Avoid overeating.  Avoid having a large meal after fasting. SEEK MEDICAL CARE IF:  You develop a  fever.  Your pain gets worse.  You vomit.  You develop nausea that prevents you from eating and drinking. SEEK IMMEDIATE MEDICAL CARE IF:  You suddenly develop a fever and shaking chills.  You develop a yellowish discoloration (jaundice) of:  Skin.  Whites of the eyes.  Mucous membranes.  You have continuous or severe pain that is not relieved with medicines.  You have nausea and vomiting that is not relieved with medicines.  You develop dizziness or you faint.   This information is not intended to replace advice given to you by your health care provider. Make sure you discuss any questions you have with your health care provider.   Document Released: 07/11/2005 Document Revised: 06/24/2014 Document Reviewed: 11/19/2013 Elsevier Interactive Patient Education 2016 [employer].    Cholelithiasis Cholelithiasis (also called gallstones) is a form of gallbladder disease in which gallstones form in your gallbladder. The gallbladder is an organ that stores bile made in the liver, which helps digest fats. Gallstones begin as small crystals and slowly grow into stones. Gallstone pain occurs when the gallbladder spasms and a gallstone is blocking the duct. Pain can also occur when a stone passes out of the duct.  RISK FACTORS  Being female.   Having multiple pregnancies. Health care providers sometimes advise removing diseased gallbladders before future pregnancies.   Being obese.  Eating a diet heavy in fried foods and fat.   Being older than 60 years and  increasing age.   Prolonged use of medicines containing female hormones.   Having diabetes mellitus.   Rapidly losing weight.   Having a family history of gallstones (heredity).  SYMPTOMS  Nausea.   Vomiting.  Abdominal pain.   Yellowing of the skin (jaundice).   Sudden pain. It may persist from several minutes to several hours.  Fever.   Tenderness to the touch. In some cases, when  gallstones do not move into the bile duct, people have no pain or symptoms. These are called "silent" gallstones.  TREATMENT Silent gallstones do not need treatment. In severe cases, emergency surgery may be required. Options for treatment include:  Surgery to remove the gallbladder. This is the most common treatment.  Medicines. These do not always work and may take 6-12 months or more to work.  Shock wave treatment (extracorporeal biliary lithotripsy). In this treatment an ultrasound machine sends shock waves to the gallbladder to break gallstones into smaller pieces that can pass into the intestines or be dissolved by medicine. HOME CARE INSTRUCTIONS   Only take over-the-counter or prescription medicines for pain, discomfort, or fever as directed by your health care provider.   Follow a low-fat diet until seen again by your health care provider. Fat causes the gallbladder to contract, which can result in pain.   Follow up with your health care provider as directed. Attacks are almost always recurrent and surgery is usually required for permanent treatment.  SEEK IMMEDIATE MEDICAL CARE IF:   Your pain increases and is not controlled by medicines.   You have a fever or persistent symptoms for more than 2-3 days.   You have a fever and your symptoms suddenly get worse.   You have persistent nausea and vomiting.  MAKE SURE YOU:   Understand these instructions.  Will watch your condition.  Will get help right away if you are not doing well or get worse.   This information is not intended to replace advice given to you by your health care provider. Make sure you discuss any questions you have with your health care provider.   Document Released: 02/03/2005 Document Revised: 10/10/2012 Document Reviewed: 08/01/2012 Elsevier Interactive Patient Education [employer].

## 2015-04-26 NOTE — ED Notes (Signed)
Pt was seen here last night and diagnosed with gallstones. Reports no relief with meds prescribed and still having abd pain and vomiting.

## 2015-04-26 NOTE — Discharge Instructions (Signed)

## 2015-04-26 NOTE — MAU Note (Addendum)
Patient was seen at [facility] ED last night and shortly before coming here, was sent home on pain medications not working, referred to general surgeon in the morning for gallstones, patient crying in triage.

## 2015-04-26 NOTE — ED Notes (Signed)
Patient left at this time with all belongings. 

## 2015-04-29 ENCOUNTER — Other Ambulatory Visit: Payer: Self-pay | Admitting: Surgery

## 2015-04-30 ENCOUNTER — Encounter (HOSPITAL_BASED_OUTPATIENT_CLINIC_OR_DEPARTMENT_OTHER): Payer: Self-pay | Admitting: *Deleted

## 2015-04-30 NOTE — Progress Notes (Signed)
   04/30/15 1142  OBSTRUCTIVE SLEEP APNEA  Have you ever been diagnosed with sleep apnea through a sleep study? No  Do you snore loudly (loud enough to be heard through closed doors)?  1  Do you often feel tired, fatigued, or sleepy during the daytime (such as falling asleep during driving or talking to someone)? 1  Has anyone observed you stop breathing during your sleep? 0  Do you have, or are you being treated for high blood pressure? 1  BMI more than 35 kg/m2? 1  Age > 43 (1-yes) 0  Female Gender (Yes=1) 0  Obstructive Sleep Apnea Score 4  Score 5 or greater  Results sent to PCP [facility])

## 2015-05-04 NOTE — H&P (Signed)
Sally Garner 04/29/2015 11:15 AM Location: [facility] Patient #: [id number] DOB: Nov 19, 1985 Undefined / Language: Cleophus Molt / Race: Black or African American Female   History of Present Illness (Ninetta Adelstein A. Ninfa Linden MD; 04/29/2015 11:40 AM) Patient words: GB.  The patient is a 30 year old female who presents for evaluation of gall stones. She is referred by the emergency department for symptomatic cholelithiasis. She has been having attacks of right upper quadrant abdominal pain for several months. It acutely became worse with significant nausea and chest pain last week. She has now improved somewhat. It is worse after fatty meals. She is otherwise without complaints. She has been mildly constipated with pain medication. She denies jaundice. The pain was described as sharp and in the epigastrium and right upper quadrant hurting to the chest. Again, it is much less today   Other Problems Harl Bowie, MD; 04/29/2015 11:36 AM) Cholelithiasis High blood pressure  Past Surgical History Harl Bowie, MD; 04/29/2015 11:36 AM) Cesarean Section - 1  Diagnostic Studies History Harl Bowie, MD; 04/29/2015 11:36 AM) Colonoscopy never Mammogram never Pap Smear 1-5 years ago  Allergies Ventura Sellers, CMA; 04/29/2015 11:15 AM) No Known Drug Allergies03/09/2015  Medication History Ventura Sellers, CMA; 04/29/2015 11:15 AM) Atenolol (25MG  Tablet, Oral) Active. Naproxen (500MG  Tablet, Oral) Active. Medications Reconciled  Social History Harl Bowie, MD; 04/29/2015 11:36 AM) Alcohol use Occasional alcohol use. Caffeine use Carbonated beverages, Coffee, Tea. No drug use Tobacco use Current some day smoker.  Family History Harl Bowie, MD; 04/29/2015 11:36 AM) Migraine Headache Mother.  Pregnancy / Birth History Harl Bowie, MD; 04/29/2015 11:36 AM) Age at menarche 83 years. Gravida 1 Maternal age 28-30 Para  1 Regular periods    Review of Systems (Jurell Basista A. Ninfa Linden MD; 04/29/2015 11:36 AM) General Present- Appetite Loss. Not Present- Chills, Fatigue, Fever, Night Sweats, Weight Gain and Weight Loss. Skin Not Present- Change in Wart/Mole, Dryness, Hives, Jaundice, New Lesions, Non-Healing Wounds, Rash and Ulcer. HEENT Not Present- Earache, Hearing Loss, Hoarseness, Nose Bleed, Oral Ulcers, Ringing in the Ears, Seasonal Allergies, Sinus Pain, Sore Throat, Visual Disturbances, Wears glasses/contact lenses and Yellow Eyes. Breast Not Present- Breast Mass, Breast Pain, Nipple Discharge and Skin Changes. Cardiovascular Not Present- Chest Pain, Difficulty Breathing Lying Down, Leg Cramps, Palpitations, Rapid Heart Rate, Shortness of Breath and Swelling of Extremities. Gastrointestinal Present- Abdominal Pain. Not Present- Bloating, Bloody Stool, Change in Bowel Habits, Chronic diarrhea, Constipation, Difficulty Swallowing, Excessive gas, Gets full quickly at meals, Hemorrhoids, Indigestion, Nausea, Rectal Pain and Vomiting. Female Genitourinary Present- Painful Urination. Not Present- Frequency, Nocturia, Pelvic Pain and Urgency. Musculoskeletal Not Present- Back Pain, Joint Pain, Joint Stiffness, Muscle Pain, Muscle Weakness and Swelling of Extremities. Neurological Not Present- Decreased Memory, Fainting, Headaches, Numbness, Seizures, Tingling, Tremor, Trouble walking and Weakness. Psychiatric Not Present- Anxiety, Bipolar, Change in Sleep Pattern, Depression, Fearful and Frequent crying. Endocrine Not Present- Cold Intolerance, Excessive Hunger, Hair Changes, Heat Intolerance, Hot flashes and New Diabetes. Hematology Not Present- Easy Bruising, Excessive bleeding, Gland problems, HIV and Persistent Infections.  Vitals [employer] R. Brooks CMA; 04/29/2015 11:15 AM) 04/29/2015 11:15 AM Weight: 217.38 lb Height: 64in Body Surface Area: 2.03 m Body Mass Index: 37.31 kg/m  BP: 112/78 (Sitting, Left  Arm, Standard)       Physical Exam (Trinaty Bundrick A. Ninfa Linden MD; 04/29/2015 11:40 AM) General Mental Status-Alert. General Appearance-Consistent with stated age. Hydration-Well hydrated. Voice-Normal.  Head and Neck Head-normocephalic, atraumatic with no lesions or palpable masses.  Eye Eyeball - Bilateral-Extraocular movements intact. Sclera/Conjunctiva - Bilateral-No scleral icterus.  Chest and Lung Exam Chest and lung exam reveals -quiet, even and easy respiratory effort with no use of accessory muscles and on auscultation, normal breath sounds, no adventitious sounds and normal vocal resonance. Inspection Chest Wall - Normal. Back - normal.  Cardiovascular Cardiovascular examination reveals -on palpation PMI is normal in location and amplitude, no palpable S3 or S4. Normal cardiac borders., normal heart sounds, regular rate and rhythm with no murmurs, carotid auscultation reveals no bruits and normal pedal pulses bilaterally.  Abdomen Inspection Inspection of the abdomen reveals - No Hernias. Skin - Scar - no surgical scars. Palpation/Percussion Palpation and Percussion of the abdomen reveal - Soft, No Rebound tenderness, No Rigidity (guarding) and No hepatosplenomegaly. Tenderness - Right Upper Quadrant. Auscultation Auscultation of the abdomen reveals - Bowel sounds normal.  Neurologic Neurologic evaluation reveals -alert and oriented x 3 with no impairment of recent or remote memory. Mental Status-Normal.  Musculoskeletal Normal Exam - Left-Upper Extremity Strength Normal and Lower Extremity Strength Normal. Normal Exam - Right-Upper Extremity Strength Normal, Lower Extremity Weakness.    Assessment & Plan (Janissa Bertram A. Ninfa Linden MD; 04/29/2015 11:41 AM) SYMPTOMATIC CHOLELITHIASIS (K80.20) Impression: I discussed the diagnosis with her in detail and gave her literature regarding surgery. I recommend urgent laparoscopic cholecystectomy. I discussed  the risks of surgery with her. These risks include but are not limited to bleeding, infection, bile duct injury, bile leak, injury to other structures, to convert to an open procedure, DVT, postoperative recovery, etc. She understands and wished to proceed with surgery Current Plans Started Percocet 5-325MG , 1 - 2 Tablet every four hours, as needed, #40, 04/29/2015, No Refill. Pt Education - Pamphlet Given - Laparoscopic Gallbladder Surgery: discussed with patient and provided information.   Signed by Harl Bowie, MD (04/29/2015 11:41 AM)

## 2015-05-05 ENCOUNTER — Encounter (HOSPITAL_BASED_OUTPATIENT_CLINIC_OR_DEPARTMENT_OTHER): Payer: Self-pay

## 2015-05-05 ENCOUNTER — Other Ambulatory Visit: Payer: Self-pay | Admitting: Internal Medicine

## 2015-05-05 ENCOUNTER — Ambulatory Visit (HOSPITAL_BASED_OUTPATIENT_CLINIC_OR_DEPARTMENT_OTHER): Payer: BLUE CROSS/BLUE SHIELD | Admitting: Anesthesiology

## 2015-05-05 ENCOUNTER — Ambulatory Visit (HOSPITAL_BASED_OUTPATIENT_CLINIC_OR_DEPARTMENT_OTHER)
Admission: RE | Admit: 2015-05-05 | Discharge: 2015-05-05 | Disposition: A | Discharge status: Home / Self Care | Payer: BLUE CROSS/BLUE SHIELD | Source: Ambulatory Visit | Attending: Surgery | Admitting: Surgery

## 2015-05-05 ENCOUNTER — Encounter (HOSPITAL_BASED_OUTPATIENT_CLINIC_OR_DEPARTMENT_OTHER)
Admission: RE | Disposition: A | Discharge status: Home / Self Care | Payer: Self-pay | Source: Ambulatory Visit | Attending: Surgery

## 2015-05-05 DIAGNOSIS — I10 Essential (primary) hypertension: Secondary | ICD-10-CM | POA: Insufficient documentation

## 2015-05-05 DIAGNOSIS — K8010 Calculus of gallbladder with chronic cholecystitis without obstruction: Secondary | ICD-10-CM | POA: Insufficient documentation

## 2015-05-05 DIAGNOSIS — F17200 Nicotine dependence, unspecified, uncomplicated: Secondary | ICD-10-CM | POA: Diagnosis not present

## 2015-05-05 DIAGNOSIS — Z6837 Body mass index (BMI) 37.0-37.9, adult: Secondary | ICD-10-CM | POA: Diagnosis not present

## 2015-05-05 DIAGNOSIS — K8020 Calculus of gallbladder without cholecystitis without obstruction: Secondary | ICD-10-CM | POA: Diagnosis present

## 2015-05-05 HISTORY — DX: Migraine, unspecified, not intractable, without status migrainosus: G43.909

## 2015-05-05 HISTORY — DX: Calculus of gallbladder without cholecystitis without obstruction: K80.20

## 2015-05-05 HISTORY — PX: CHOLECYSTECTOMY: SHX55

## 2015-05-05 SURGERY — LAPAROSCOPIC CHOLECYSTECTOMY
Anesthesia: General | Site: Abdomen

## 2015-05-05 MED ORDER — FENTANYL CITRATE (PF) 100 MCG/2ML IJ SOLN
INTRAMUSCULAR | Status: AC
  Filled 2015-05-05: qty 2

## 2015-05-05 MED ORDER — BUPIVACAINE-EPINEPHRINE (PF) 0.5% -1:200000 IJ SOLN
INTRAMUSCULAR | Status: DC | PRN
  Administered 2015-05-05: 20 mL

## 2015-05-05 MED ORDER — PROPOFOL 10 MG/ML IV BOLUS
INTRAVENOUS | Status: DC | PRN
  Administered 2015-05-05: 200 mg via INTRAVENOUS

## 2015-05-05 MED ORDER — LIDOCAINE HCL (CARDIAC) 20 MG/ML IV SOLN
INTRAVENOUS | Status: AC
  Filled 2015-05-05: qty 5

## 2015-05-05 MED ORDER — SUGAMMADEX SODIUM 200 MG/2ML IV SOLN
INTRAVENOUS | Status: DC | PRN
  Administered 2015-05-05: 200 mg via INTRAVENOUS

## 2015-05-05 MED ORDER — MIDAZOLAM HCL 2 MG/2ML IJ SOLN
1.0000 mg | INTRAMUSCULAR | Status: DC | PRN
  Administered 2015-05-05 (×2): 1 mg via INTRAVENOUS

## 2015-05-05 MED ORDER — PROPOFOL 10 MG/ML IV BOLUS
INTRAVENOUS | Status: AC
Start: 2015-05-05 — End: 2015-05-05
  Filled 2015-05-05: qty 20

## 2015-05-05 MED ORDER — OXYCODONE HCL 5 MG/5ML PO SOLN
5.0000 mg | Freq: Once | ORAL | Status: DC | PRN
Start: 2015-05-05 — End: 2015-05-05

## 2015-05-05 MED ORDER — ROCURONIUM BROMIDE 50 MG/5ML IV SOLN
INTRAVENOUS | Status: AC
  Filled 2015-05-05: qty 1

## 2015-05-05 MED ORDER — SCOPOLAMINE 1 MG/3DAYS TD PT72: 1.0000 | MEDICATED_PATCH | Freq: Once | TRANSDERMAL | Status: DC | PRN

## 2015-05-05 MED ORDER — KETOROLAC TROMETHAMINE 30 MG/ML IJ SOLN
INTRAMUSCULAR | Status: DC | PRN
  Administered 2015-05-05: 30 mg via INTRAVENOUS

## 2015-05-05 MED ORDER — KETOROLAC TROMETHAMINE 30 MG/ML IJ SOLN
INTRAMUSCULAR | Status: AC
  Filled 2015-05-05: qty 1

## 2015-05-05 MED ORDER — LIDOCAINE HCL (CARDIAC) 20 MG/ML IV SOLN
INTRAVENOUS | Status: DC | PRN
  Administered 2015-05-05: 80 mg via INTRAVENOUS

## 2015-05-05 MED ORDER — HYDROMORPHONE HCL 1 MG/ML IJ SOLN
INTRAMUSCULAR | Status: AC
  Filled 2015-05-05: qty 1

## 2015-05-05 MED ORDER — GLYCOPYRROLATE 0.2 MG/ML IJ SOLN: 0.2000 mg | Freq: Once | INTRAMUSCULAR | Status: DC | PRN

## 2015-05-05 MED ORDER — ARTIFICIAL TEARS OP OINT
TOPICAL_OINTMENT | OPHTHALMIC | Status: AC
  Filled 2015-05-05: qty 3.5

## 2015-05-05 MED ORDER — DEXAMETHASONE SODIUM PHOSPHATE 10 MG/ML IJ SOLN
INTRAMUSCULAR | Status: AC
  Filled 2015-05-05: qty 1

## 2015-05-05 MED ORDER — SUCCINYLCHOLINE CHLORIDE 20 MG/ML IJ SOLN
INTRAMUSCULAR | Status: DC | PRN
  Administered 2015-05-05: 100 mg via INTRAVENOUS

## 2015-05-05 MED ORDER — CEFAZOLIN SODIUM-DEXTROSE 2-3 GM-% IV SOLR
2.0000 g | INTRAVENOUS | Status: AC
  Administered 2015-05-05: 2 g via INTRAVENOUS

## 2015-05-05 MED ORDER — MIDAZOLAM HCL 2 MG/2ML IJ SOLN
INTRAMUSCULAR | Status: AC
  Filled 2015-05-05: qty 2

## 2015-05-05 MED ORDER — SUGAMMADEX SODIUM 200 MG/2ML IV SOLN
INTRAVENOUS | Status: AC
  Filled 2015-05-05: qty 2

## 2015-05-05 MED ORDER — ROCURONIUM 10MG/ML (10ML) SYRINGE FOR MEDFUSION PUMP - OPTIME
INTRAVENOUS | Status: DC | PRN
  Administered 2015-05-05: 20 mg via INTRAVENOUS

## 2015-05-05 MED ORDER — ONDANSETRON HCL 4 MG/2ML IJ SOLN
INTRAMUSCULAR | Status: AC
  Filled 2015-05-05: qty 2

## 2015-05-05 MED ORDER — FENTANYL CITRATE (PF) 100 MCG/2ML IJ SOLN
50.0000 ug | INTRAMUSCULAR | Status: DC | PRN
  Administered 2015-05-05 (×2): 50 ug via INTRAVENOUS

## 2015-05-05 MED ORDER — SODIUM CHLORIDE 0.9 % IR SOLN
Status: DC | PRN
  Administered 2015-05-05: 1

## 2015-05-05 MED ORDER — LACTATED RINGERS IV SOLN
INTRAVENOUS | Status: DC
  Administered 2015-05-05: 09:00:00 via INTRAVENOUS

## 2015-05-05 MED ORDER — ONDANSETRON HCL 4 MG/2ML IJ SOLN
INTRAMUSCULAR | Status: DC | PRN
  Administered 2015-05-05: 4 mg via INTRAVENOUS

## 2015-05-05 MED ORDER — HYDROMORPHONE HCL 1 MG/ML IJ SOLN
0.2500 mg | INTRAMUSCULAR | Status: DC | PRN
  Administered 2015-05-05 (×3): 0.5 mg via INTRAVENOUS

## 2015-05-05 MED ORDER — OXYCODONE HCL 5 MG PO TABS: 5.0000 mg | ORAL_TABLET | Freq: Once | ORAL | Status: DC | PRN

## 2015-05-05 MED ORDER — MEPERIDINE HCL 25 MG/ML IJ SOLN: 6.2500 mg | INTRAMUSCULAR | Status: DC | PRN

## 2015-05-05 MED ORDER — DEXAMETHASONE SODIUM PHOSPHATE 4 MG/ML IJ SOLN
INTRAMUSCULAR | Status: DC | PRN
  Administered 2015-05-05: 10 mg via INTRAVENOUS

## 2015-05-05 SURGICAL SUPPLY — 35 items
APPLIER CLIP 5 13 M/L LIGAMAX5 (MISCELLANEOUS) ×3
BLADE CLIPPER SURG (BLADE) IMPLANT
CHLORAPREP W/TINT 26ML (MISCELLANEOUS) ×3 IMPLANT
CLIP APPLIE 5 13 M/L LIGAMAX5 (MISCELLANEOUS) ×1 IMPLANT
COVER MAYO STAND STRL (DRAPES) IMPLANT
DECANTER SPIKE VIAL GLASS SM (MISCELLANEOUS) IMPLANT
DRAPE C-ARM 42X72 X-RAY (DRAPES) IMPLANT
ELECT REM PT RETURN 9FT ADLT (ELECTROSURGICAL) ×3
ELECTRODE REM PT RTRN 9FT ADLT (ELECTROSURGICAL) ×1 IMPLANT
FILTER SMOKE EVAC LAPAROSHD (FILTER) IMPLANT
GLOVE BIO SURGEON STRL SZ7 (GLOVE) ×3 IMPLANT
GLOVE BIOGEL PI IND STRL 7.0 (GLOVE) ×4 IMPLANT
GLOVE BIOGEL PI INDICATOR 7.0 (GLOVE) ×8
GLOVE ECLIPSE 6.5 STRL STRAW (GLOVE) ×3 IMPLANT
GLOVE SURG SIGNA 7.5 PF LTX (GLOVE) ×3 IMPLANT
GOWN STRL REUS W/ TWL LRG LVL3 (GOWN DISPOSABLE) ×3 IMPLANT
GOWN STRL REUS W/TWL LRG LVL3 (GOWN DISPOSABLE) ×6
LIQUID BAND (GAUZE/BANDAGES/DRESSINGS) ×6 IMPLANT
NS IRRIG 1000ML POUR BTL (IV SOLUTION) ×3 IMPLANT
PACK BASIN DAY SURGERY FS (CUSTOM PROCEDURE TRAY) ×3 IMPLANT
POUCH SPECIMEN RETRIEVAL 10MM (ENDOMECHANICALS) ×3 IMPLANT
SCISSORS LAP 5X35 DISP (ENDOMECHANICALS) ×3 IMPLANT
SET CHOLANGIOGRAPH 5 50 .035 (SET/KITS/TRAYS/PACK) IMPLANT
SET IRRIG TUBING LAPAROSCOPIC (IRRIGATION / IRRIGATOR) ×3 IMPLANT
SLEEVE ENDOPATH XCEL 5M (ENDOMECHANICALS) ×6 IMPLANT
SLEEVE SCD COMPRESS KNEE MED (MISCELLANEOUS) ×3 IMPLANT
SPECIMEN JAR SMALL (MISCELLANEOUS) ×3 IMPLANT
SUT MON AB 4-0 PC3 18 (SUTURE) ×3 IMPLANT
TOWEL OR 17X24 6PK STRL BLUE (TOWEL DISPOSABLE) ×3 IMPLANT
TRAY LAPAROSCOPIC (CUSTOM PROCEDURE TRAY) ×3 IMPLANT
TROCAR XCEL BLUNT TIP 100MML (ENDOMECHANICALS) ×3 IMPLANT
TROCAR XCEL NON-BLD 5MMX100MML (ENDOMECHANICALS) ×3 IMPLANT
TUBE CONNECTING 20'X1/4 (TUBING) ×1
TUBE CONNECTING 20X1/4 (TUBING) ×2 IMPLANT
TUBING INSUFFLATION (TUBING) ×3 IMPLANT

## 2015-05-05 NOTE — Op Note (Signed)

## 2015-05-05 NOTE — Transfer of Care (Signed)
Immediate Anesthesia Transfer of Care Note  Patient: Sally Garner  Procedure(s) Performed: Procedure(s): LAPAROSCOPIC CHOLECYSTECTOMY (N/A)  Patient Location: PACU  Anesthesia Type:General  Level of Consciousness: awake, sedated and patient cooperative  Airway & Oxygen Therapy: Patient Spontanous Breathing and Patient connected to face mask oxygen  Post-op Assessment: Report given to RN and Post -op Vital signs reviewed and stable  Post vital signs: Reviewed and stable  Last Vitals:  Filed Vitals:   05/05/15 0857  BP: 133/80  Pulse: 86  Temp: 36.7 C  Resp: 18    Complications: No apparent anesthesia complications

## 2015-05-05 NOTE — Anesthesia Procedure Notes (Signed)
Procedure Name: Intubation Date/Time: 05/05/2015 9:36 AM Performed by: Lyndee Leo Pre-anesthesia Checklist: Patient identified, Emergency Drugs available, Suction available and Patient being monitored Patient Re-evaluated:Patient Re-evaluated prior to inductionOxygen Delivery Method: Circle System Utilized Preoxygenation: Pre-oxygenation with 100% oxygen Intubation Type: IV induction Ventilation: Mask ventilation without difficulty Laryngoscope Size: Mac and 3 Grade View: Grade II Tube type: Oral Tube size: 7.0 mm Number of attempts: 1 Airway Equipment and Method: Stylet and Oral airway Placement Confirmation: ETT inserted through vocal cords under direct vision,  positive ETCO2 and breath sounds checked- equal and bilateral Secured at: 21 cm Tube secured with: Tape Dental Injury: Teeth and Oropharynx as per pre-operative assessment

## 2015-05-05 NOTE — Discharge Instructions (Signed)
[facility] ______[facility] LAPAROSCOPIC SURGERY: POST OP INSTRUCTIONS Always review your discharge instruction sheet given to you by the facility where your surgery was performed. IF YOU HAVE DISABILITY OR FAMILY LEAVE FORMS, YOU MUST BRING THEM TO THE OFFICE FOR PROCESSING.   DO NOT GIVE THEM TO YOUR DOCTOR.  1. A prescription for pain medication may be given to you upon discharge.  Take your pain medication as prescribed, if needed.  If narcotic pain medicine is not needed, then you may take acetaminophen (Tylenol) or ibuprofen (Advil) as needed. 2. Take your usually prescribed medications unless otherwise directed. 3. If you need a refill on your pain medication, please contact your pharmacy.  They will contact our office to request authorization. Prescriptions will not be filled after 5pm or on week-ends. 4. You should follow a light diet the first few days after arrival home, such as soup and crackers, etc.  Be sure to include lots of fluids daily. 5. Most patients will experience some swelling and bruising in the area of the incisions.  Ice packs will help.  Swelling and bruising can take several days to resolve.  6. It is common to experience some constipation if taking pain medication after surgery.  Increasing fluid intake and taking a stool softener (such as Colace) will usually help or prevent this problem from occurring.  A mild laxative (Milk of Magnesia or Miralax) should be taken according to package instructions if there are no bowel movements after 48 hours. 7. Unless discharge instructions indicate otherwise, you may remove your bandages 24-48 hours after surgery, and you may shower at that time.  You may have steri-strips (small skin tapes) in place directly over the incision.  These strips should be left on the skin for 7-10 days.  If your surgeon used skin glue on the incision, you may shower in 24 hours.  The glue will flake off over the next 2-3 weeks.  Any sutures or  staples will be removed at the office during your follow-up visit. 8. ACTIVITIES:  You may resume regular (light) daily activities beginning the next day--such as daily self-care, walking, climbing stairs--gradually increasing activities as tolerated.  You may have sexual intercourse when it is comfortable.  Refrain from any heavy lifting or straining until approved by your doctor. a. You may drive when you are no longer taking prescription pain medication, you can comfortably wear a seatbelt, and you can safely maneuver your car and apply brakes. b. RETURN TO WORK:  ___MAY RETURN TO WORK ON 05/18/2014 WITHOUT LIMITS_______________________________________________________ 9. You should see your doctor in the office for a follow-up appointment approximately 2-3 weeks after your surgery.  Make sure that you call for this appointment within a day or two after you arrive home to insure a convenient appointment time. 10. OTHER INSTRUCTIONS:ICE PACK AND IBUPROFEN ALSO FOR PAIN __________________________________________________________________________________________________________________________ __________________________________________________________________________________________________________________________ WHEN TO CALL YOUR DOCTOR: 1. Fever over 101.0 2. Inability to urinate 3. Continued bleeding from incision. 4. Increased pain, redness, or drainage from the incision. 5. Increasing abdominal pain  The clinic staff is available to answer your questions during regular business hours.  Please dont hesitate to call and ask to speak to one of the nurses for clinical concerns.  If you have a medical emergency, go to the nearest emergency room or call 911.  A surgeon from [facility] is always on call at the hospital. [address], [place], [place], [place]  [address] ? [address] [address], [place], [place]  [id number] [phone] ? [phone] ? FAX [phone] Web site:  [email or web address]    Post Anesthesia Home Care Instructions  Activity: Get plenty of rest for the remainder of the day. A responsible adult should stay with you for 24 hours following the procedure.  For the next 24 hours, DO NOT: -Drive a car -Paediatric nurse -Drink alcoholic beverages -Take any medication unless instructed by your physician -Make any legal decisions or sign important papers.  Meals: Start with liquid foods such as gelatin or soup. Progress to regular foods as tolerated. Avoid greasy, spicy, heavy foods. If nausea and/or vomiting occur, drink only clear liquids until the nausea and/or vomiting subsides. Call your physician if vomiting continues.  Special Instructions/Symptoms: Your throat may feel dry or sore from the anesthesia or the breathing tube placed in your throat during surgery. If this causes discomfort, gargle with warm salt water. The discomfort should disappear within 24 hours.  If you had a scopolamine patch placed behind your ear for the management of post- operative nausea and/or vomiting:  1. The medication in the patch is effective for 72 hours, after which it should be removed.  Wrap patch in a tissue and discard in the trash. Wash hands thoroughly with soap and water. 2. You may remove the patch earlier than 72 hours if you experience unpleasant side effects which may include dry mouth, dizziness or visual disturbances. 3. Avoid touching the patch. Wash your hands with soap and water after contact with the patch.

## 2015-05-05 NOTE — Anesthesia Postprocedure Evaluation (Signed)
Anesthesia Post Note  Patient: Sally Garner  Procedure(s) Performed: Procedure(s) (LRB): LAPAROSCOPIC CHOLECYSTECTOMY (N/A)  Patient location during evaluation: PACU Anesthesia Type: General Level of consciousness: awake and alert Pain management: pain level controlled Vital Signs Assessment: post-procedure vital signs reviewed and stable Respiratory status: spontaneous breathing, nonlabored ventilation and respiratory function stable Cardiovascular status: blood pressure returned to baseline and stable Postop Assessment: no signs of nausea or vomiting Anesthetic complications: no    Last Vitals:  Filed Vitals:   05/05/15 1145 05/05/15 1231  BP: 130/81 131/98  Pulse:  81  Temp:  36.5 C  Resp: 15 20    Last Pain:  Filed Vitals:   05/05/15 1232  PainSc: 1                  [name] A

## 2015-05-05 NOTE — Interval H&P Note (Signed)
History and Physical Interval Note:no change in H and P  05/05/2015 8:46 AM  Sally Garner  has presented today for surgery, with the diagnosis of Symptomatic cholelithiasis  The various methods of treatment have been discussed with the patient and family. After consideration of risks, benefits and other options for treatment, the patient has consented to  Procedure(s): LAPAROSCOPIC CHOLECYSTECTOMY (N/A) as a surgical intervention .  The patient's history has been reviewed, patient examined, no change in status, stable for surgery.  I have reviewed the patient's chart and labs.  Questions were answered to the patient's satisfaction.     Jahniah Pallas A

## 2015-05-05 NOTE — Anesthesia Preprocedure Evaluation (Signed)
Anesthesia Evaluation  Patient identified by MRN, date of birth, ID band Patient awake    Reviewed: Allergy & Precautions, NPO status , Patient's Chart, lab work & pertinent test results  Airway Mallampati: I  TM Distance: >3 FB Neck ROM: Full    Dental  (+) Teeth Intact, Dental Advisory Given   Pulmonary Current Smoker,    breath sounds clear to auscultation       Cardiovascular hypertension, Pt. on medications and Pt. on home beta blockers  Rhythm:Regular Rate:Normal     Neuro/Psych    GI/Hepatic   Endo/Other  Morbid obesity  Renal/GU      Musculoskeletal   Abdominal   Peds  Hematology   Anesthesia Other Findings   Reproductive/Obstetrics                             Anesthesia Physical Anesthesia Plan  ASA: III  Anesthesia Plan: General   Post-op Pain Management:    Induction: Intravenous  Airway Management Planned: Oral ETT  Additional Equipment:   Intra-op Plan:   Post-operative Plan: Extubation in OR  Informed Consent: I have reviewed the patients History and Physical, chart, labs and discussed the procedure including the risks, benefits and alternatives for the proposed anesthesia with the patient or authorized representative who has indicated his/her understanding and acceptance.   Dental advisory given  Plan Discussed with: CRNA, Anesthesiologist and Surgeon  Anesthesia Plan Comments:         Anesthesia Quick Evaluation

## 2015-05-06 ENCOUNTER — Encounter (HOSPITAL_BASED_OUTPATIENT_CLINIC_OR_DEPARTMENT_OTHER): Payer: Self-pay | Admitting: Surgery

## 2015-05-14 ENCOUNTER — Ambulatory Visit (INDEPENDENT_AMBULATORY_CARE_PROVIDER_SITE_OTHER): Payer: BLUE CROSS/BLUE SHIELD | Admitting: Internal Medicine

## 2015-05-14 ENCOUNTER — Encounter: Payer: Self-pay | Admitting: Internal Medicine

## 2015-05-14 VITALS — BP 122/78 | HR 82 | Temp 98.0°F | Resp 16 | Ht 66.5 in | Wt 218.0 lb

## 2015-05-14 DIAGNOSIS — R51 Headache: Secondary | ICD-10-CM

## 2015-05-14 DIAGNOSIS — Z9049 Acquired absence of other specified parts of digestive tract: Secondary | ICD-10-CM | POA: Diagnosis not present

## 2015-05-14 DIAGNOSIS — R519 Headache, unspecified: Secondary | ICD-10-CM

## 2015-05-14 NOTE — Progress Notes (Signed)
Assessment and Plan:   1. Status post cholecystectomy -doing well after surgery -no further abdominal pains  2. Generalized headaches -cont atenolol -if orthostasis not better with sitting on side of bed in morning then recommend cutting atenolol in half -get vision checked     HPI 30 y.o.female presents for 1 month follow up of generalized headaches and recent hospitalization for gallstones and urgent cholecystectomy.  She reports that she has been doing . Patient reports that they have been doing well.  female is taking their medication.  They are having difficulty with their medications.  They report some adverse reactions of dizziness especially when she stands up first thing in the morning.  She still has blurred vision with distance reading and given recent surgery for gallbladder has not been able to get her eyes check.    ROS positive for headache, blurry vision, orthostatic dizziness.  Denies N/V, Diarrhea, constipation, abdominal pain, tingling, numbness, LOC.      Past Medical History  Diagnosis Date  . Migraines   . Hypertension     was put on medication end of 03/2015; states has caused headache and is not taking as prescribed  . Symptomatic cholelithiasis 04/2015     No Known Allergies    Current Outpatient Prescriptions on File Prior to Visit  Medication Sig Dispense Refill  . atenolol (TENORMIN) 25 MG tablet Take 1 tablet (25 mg total) by mouth at bedtime. 60 tablet 11   No current facility-administered medications on file prior to visit.    ROS: all negative except above.   Physical Exam: Filed Weights   05/14/15 1043  Weight: 218 lb (98.884 kg)   BP 122/78 mmHg  Pulse 82  Temp(Src) 98 F (36.7 C) (Temporal)  Resp 16  Ht 5' 6.5" (1.689 m)  Wt 218 lb (98.884 kg)  BMI 34.66 kg/m2  LMP 04/26/2015 (Exact Date) General Appearance: Well developed well nourished, non-toxic appearing in no apparent distress. Eyes: PERRLA, EOMs, conjunctiva w/ no swelling  or erythema or discharge Sinuses: No Frontal/maxillary tenderness ENT/Mouth: Ear canals clear without swelling or erythema.  TM's normal bilaterally with no retractions, bulging, or loss of landmarks.   Neck: Supple, thyroid normal, no notable JVD  Respiratory: Respiratory effort normal, Clear breath sounds anteriorly and posteriorly bilaterally without rales, rhonchi, wheezing or stridor. No retractions or accessory muscle usage. Cardio: RRR with no MRGs.   Abdomen: Soft, + BS.  Non tender, no guarding, rebound, hernias, masses.  Musculoskeletal: Full ROM, 5/5 strength, normal gait.  Skin: Warm, dry without rashes  Neuro: Awake and oriented X 3, Cranial nerves intact. Normal muscle tone, no cerebellar symptoms. Sensation intact.  Psych: normal affect, Insight and Judgment appropriate.     Starlyn Skeans, PA-C 11:19 AM [facility]

## 2015-08-07 ENCOUNTER — Emergency Department (HOSPITAL_COMMUNITY)
Admission: EM | Admit: 2015-08-07 | Discharge: 2015-08-07 | Disposition: A | Discharge status: Home / Self Care | Payer: BLUE CROSS/BLUE SHIELD | Attending: Emergency Medicine | Admitting: Emergency Medicine

## 2015-08-07 ENCOUNTER — Emergency Department (HOSPITAL_COMMUNITY): Payer: BLUE CROSS/BLUE SHIELD

## 2015-08-07 ENCOUNTER — Encounter (HOSPITAL_COMMUNITY): Payer: Self-pay

## 2015-08-07 DIAGNOSIS — R05 Cough: Secondary | ICD-10-CM | POA: Diagnosis present

## 2015-08-07 DIAGNOSIS — IMO0001 Reserved for inherently not codable concepts without codable children: Secondary | ICD-10-CM

## 2015-08-07 DIAGNOSIS — R030 Elevated blood-pressure reading, without diagnosis of hypertension: Secondary | ICD-10-CM

## 2015-08-07 DIAGNOSIS — R059 Cough, unspecified: Secondary | ICD-10-CM

## 2015-08-07 DIAGNOSIS — F17210 Nicotine dependence, cigarettes, uncomplicated: Secondary | ICD-10-CM | POA: Diagnosis not present

## 2015-08-07 DIAGNOSIS — I10 Essential (primary) hypertension: Secondary | ICD-10-CM | POA: Diagnosis not present

## 2015-08-07 MED ORDER — IPRATROPIUM BROMIDE 0.02 % IN SOLN
0.5000 mg | Freq: Once | RESPIRATORY_TRACT | Status: AC
  Administered 2015-08-07: 0.5 mg via RESPIRATORY_TRACT
  Filled 2015-08-07: qty 2.5

## 2015-08-07 MED ORDER — ALBUTEROL SULFATE HFA 108 (90 BASE) MCG/ACT IN AERS
2.0000 | INHALATION_SPRAY | Freq: Once | RESPIRATORY_TRACT | Status: AC
  Administered 2015-08-07: 2 via RESPIRATORY_TRACT
  Filled 2015-08-07: qty 6.7

## 2015-08-07 MED ORDER — PREDNISONE 50 MG PO TABS: ORAL_TABLET | ORAL | Status: DC

## 2015-08-07 MED ORDER — ALBUTEROL SULFATE (2.5 MG/3ML) 0.083% IN NEBU
5.0000 mg | INHALATION_SOLUTION | Freq: Once | RESPIRATORY_TRACT | Status: AC
  Administered 2015-08-07: 5 mg via RESPIRATORY_TRACT

## 2015-08-07 MED ORDER — PREDNISONE 20 MG PO TABS
60.0000 mg | ORAL_TABLET | Freq: Once | ORAL | Status: AC
  Administered 2015-08-07: 60 mg via ORAL
  Filled 2015-08-07: qty 3

## 2015-08-07 NOTE — ED Provider Notes (Signed)
CSN: [id number]     Arrival date & time 08/07/15  1147 History   First MD Initiated Contact with Patient 08/07/15 1355     Chief Complaint  Patient presents with  . Cough     (Consider location/radiation/quality/duration/timing/severity/associated sxs/prior Treatment) HPI  Blood pressure 166/112, pulse 97, temperature 98 F (36.7 C), temperature source Oral, resp. rate 17, last menstrual period 07/27/2015, SpO2 98 %.  Sally Garner is a 30 y.o. female complaining of dry cough onset 1 month ago with wheezing onset 1 week ago patient denies chest pain, shortness of breath, fever, chills, rhinorrhea, history of asthma, history of DVT/PE, recent immobilization, calf pain, leg swelling. No medications taken prior to arrival. She does not take an ACE inhibitor, takes a beta blocker for HA but did not realize that this medication also treats high blood pressure. She has been noncompliant with it because she states it makes her lightheaded when she stands.  Past Medical History  Diagnosis Date  . Migraines   . Hypertension     was put on medication end of 03/2015; states has caused headache and is not taking as prescribed  . Symptomatic cholelithiasis 04/2015   Past Surgical History  Procedure Laterality Date  . Cesarean section N/A 05/25/2013    Procedure: CESAREAN SECTION;  Surgeon: Annalee Genta, DO;  Location: [facility];  Service: Obstetrics;  Laterality: N/A;  . Cholecystectomy N/A 05/05/2015    Procedure: LAPAROSCOPIC CHOLECYSTECTOMY;  Surgeon: Coralie Keens, MD;  Location: [facility];  Service: General;  Laterality: N/A;   Family History  Problem Relation Age of Onset  . Hypertension Maternal Aunt   . Hypertension Maternal Uncle   . Birth defects Brother     polydactyl  . Cancer Paternal Aunt     breast cancer   Social History  Substance Use Topics  . Smoking status: Current Some Day Smoker -- 0.50 packs/day for 3 years    Types: Cigarettes  . Smokeless  tobacco: Never Used  . Alcohol Use: Yes     Comment: occasional   OB History    Gravida Para Term Preterm AB TAB SAB Ectopic Multiple Living   1 1 1       1      Review of Systems  10 systems reviewed and found to be negative, except as noted in the HPI.   Allergies  Review of patient's allergies indicates no known allergies.  Home Medications   Prior to Admission medications   Medication Sig Start Date End Date Taking? Authorizing Provider  atenolol (TENORMIN) 25 MG tablet Take 1 tablet (25 mg total) by mouth at bedtime. 04/14/15 04/13/16 Yes Courtney Forcucci, PA-C  predniSONE (DELTASONE) 50 MG tablet Take 1 tablet daily with breakfast 08/07/15   Corion Sherrod, PA-C   BP 142/95 mmHg  Pulse 87  Temp(Src) 98 F (36.7 C) (Oral)  Resp 17  SpO2 100%  LMP 07/27/2015 (Approximate) Physical Exam  Constitutional: She is oriented to person, place, and time. She appears well-developed and well-nourished. No distress.  HENT:  Head: Normocephalic and atraumatic.  Mouth/Throat: Oropharynx is clear and moist.  Eyes: Conjunctivae and EOM are normal. Pupils are equal, round, and reactive to light.  Neck: Normal range of motion.  Cardiovascular: Normal rate, regular rhythm and intact distal pulses.   Pulmonary/Chest: Effort normal and breath sounds normal.  Abdominal: Soft. There is no tenderness.  Musculoskeletal: Normal range of motion.  Neurological: She is alert and oriented to person, place, and time.  Skin: She is not diaphoretic.  Psychiatric: She has a normal mood and affect.  Nursing note and vitals reviewed.   ED Course  Procedures (including critical care time) Labs Review Labs Reviewed - No data to display  Imaging Review Dg Chest 2 View  08/07/2015  CLINICAL DATA:  Cough and wheezing for several days EXAM: CHEST  2 VIEW COMPARISON:  April 25, 2015 FINDINGS: Lungs are clear. Heart size and pulmonary vascularity are normal. No adenopathy. No bone lesions. IMPRESSION:  No edema or consolidation. Electronically Signed   By: Lowella Grip III M.D.   On: 08/07/2015 12:57   I have personally reviewed and evaluated these images and lab results as part of my medical decision-making.   EKG Interpretation None      MDM   Final diagnoses:  Elevated blood pressure  Cough    Filed Vitals:   08/07/15 1150 08/07/15 1358 08/07/15 1415 08/07/15 1430  BP: 152/110 166/112 152/107 142/95  Pulse: 88 97 81 87  Temp: 98 F (36.7 C)     TempSrc: Oral     Resp: 16 17  17   SpO2: 100% 98% 100% 100%    Medications  ipratropium (ATROVENT) nebulizer solution 0.5 mg (0.5 mg Nebulization Given 08/07/15 1407)  predniSONE (DELTASONE) tablet 60 mg (60 mg Oral Given 08/07/15 1407)  albuterol (PROVENTIL HFA;VENTOLIN HFA) 108 (90 Base) MCG/ACT inhaler 2 puff (2 puffs Inhalation Given 08/07/15 1410)  albuterol (PROVENTIL) (2.5 MG/3ML) 0.083% nebulizer solution 5 mg (5 mg Nebulization Given 08/07/15 1409)    Sally Garner is 30 y.o. female presenting with Cough and wheezing, lung sounds clear on my exam, blood pressure is significantly elevated, she's been noncompliant with her atenolol. States that she will become more compliant if she continues to have symptoms we'll follow with primary care for alternative medication. Patient given nebulizer and will be started on prednisone bursts.  Evaluation does not show pathology that would require ongoing emergent intervention or inpatient treatment. Pt is hemodynamically stable and mentating appropriately. Discussed findings and plan with patient/guardian, who agrees with care plan. All questions answered. Return precautions discussed and outpatient follow up given.   New Prescriptions   PREDNISONE (DELTASONE) 50 MG TABLET    Take 1 tablet daily with breakfast         Monico Blitz, PA-C 08/07/15 1438  Virgel Manifold, MD 08/14/15 2208

## 2015-08-07 NOTE — ED Notes (Signed)
Patient states she has high blood pressure

## 2015-08-07 NOTE — ED Notes (Signed)
Per PT, Pt has had a cough for a month now and has been unable to get rid of it. Pt reports productive with clear sputum with some wheezing noted when breathing.

## 2015-08-07 NOTE — Discharge Instructions (Signed)
Itis very important that you take your high blood pressure medication every day as prescribed.  Please follow with your primary care doctor in the next 2 days for a check-up. They must obtain records for further management.   Do not hesitate to return to the Emergency Department for any new, worsening or concerning symptoms.     Cough, Adult Coughing is a reflex that clears your throat and your airways. Coughing helps to heal and protect your lungs. It is normal to cough occasionally, but a cough that happens with other symptoms or lasts a long time may be a sign of a condition that needs treatment. A cough may last only 2-3 weeks (acute), or it may last longer than 8 weeks (chronic). CAUSES Coughing is commonly caused by:  Breathing in substances that irritate your lungs.  A viral or bacterial respiratory infection.  Allergies.  Asthma.  Postnasal drip.  Smoking.  Acid backing up from the stomach into the esophagus (gastroesophageal reflux).  Certain medicines.  Chronic lung problems, including COPD (or rarely, lung cancer).  Other medical conditions such as heart failure. HOME CARE INSTRUCTIONS  Pay attention to any changes in your symptoms. Take these actions to help with your discomfort:  Take medicines only as told by your health care provider.  If you were prescribed an antibiotic medicine, take it as told by your health care provider. Do not stop taking the antibiotic even if you start to feel better.  Talk with your health care provider before you take a cough suppressant medicine.  Drink enough fluid to keep your urine clear or pale yellow.  If the air is dry, use a cold steam vaporizer or humidifier in your bedroom or your home to help loosen secretions.  Avoid anything that causes you to cough at work or at home.  If your cough is worse at night, try sleeping in a semi-upright position.  Avoid cigarette smoke. If you smoke, quit smoking. If you need help  quitting, ask your health care provider.  Avoid caffeine.  Avoid alcohol.  Rest as needed. SEEK MEDICAL CARE IF:   You have new symptoms.  You cough up pus.  Your cough does not get better after 2-3 weeks, or your cough gets worse.  You cannot control your cough with suppressant medicines and you are losing sleep.  You develop pain that is getting worse or pain that is not controlled with pain medicines.  You have a fever.  You have unexplained weight loss.  You have night sweats. SEEK IMMEDIATE MEDICAL CARE IF:  You cough up blood.  You have difficulty breathing.  Your heartbeat is very fast.   This information is not intended to replace advice given to you by your health care provider. Make sure you discuss any questions you have with your health care provider.   Document Released: 08/06/2010 Document Revised: 10/29/2014 Document Reviewed: 04/16/2014 Elsevier Interactive Patient Education [employer].

## 2015-09-13 ENCOUNTER — Emergency Department (HOSPITAL_COMMUNITY)
Admission: EM | Admit: 2015-09-13 | Discharge: 2015-09-13 | Disposition: A | Discharge status: Home / Self Care | Payer: BLUE CROSS/BLUE SHIELD | Attending: Emergency Medicine | Admitting: Emergency Medicine

## 2015-09-13 ENCOUNTER — Encounter (HOSPITAL_COMMUNITY): Payer: Self-pay | Admitting: Emergency Medicine

## 2015-09-13 DIAGNOSIS — L309 Dermatitis, unspecified: Secondary | ICD-10-CM | POA: Diagnosis not present

## 2015-09-13 DIAGNOSIS — I10 Essential (primary) hypertension: Secondary | ICD-10-CM | POA: Insufficient documentation

## 2015-09-13 DIAGNOSIS — F17210 Nicotine dependence, cigarettes, uncomplicated: Secondary | ICD-10-CM | POA: Diagnosis not present

## 2015-09-13 DIAGNOSIS — L089 Local infection of the skin and subcutaneous tissue, unspecified: Secondary | ICD-10-CM | POA: Insufficient documentation

## 2015-09-13 DIAGNOSIS — Z79899 Other long term (current) drug therapy: Secondary | ICD-10-CM | POA: Insufficient documentation

## 2015-09-13 MED ORDER — TRIAMCINOLONE ACETONIDE 0.1 % EX CREA: 1.0000 "application " | TOPICAL_CREAM | Freq: Two times a day (BID) | CUTANEOUS | 0 refills | Status: DC

## 2015-09-13 MED ORDER — SULFAMETHOXAZOLE-TRIMETHOPRIM 800-160 MG PO TABS: 1.0000 | ORAL_TABLET | Freq: Two times a day (BID) | ORAL | 0 refills | Status: AC

## 2015-09-13 MED ORDER — CEPHALEXIN 500 MG PO CAPS: 500.0000 mg | ORAL_CAPSULE | Freq: Four times a day (QID) | ORAL | 0 refills | Status: DC

## 2015-09-13 NOTE — ED Provider Notes (Signed)
[facility] DEPT Provider Note   First Provider Contact:  11:36 AM  By signing my name below, I, Bea Graff, attest that this documentation has been prepared under the direction and in the presence of [facility], PA-C. Electronically Signed: Bea Graff, ED Scribe. 09/13/15. 11:42 AM.  History   Chief Complaint Chief Complaint  Patient presents with  . Hand Problem   The history is provided by the patient and medical records. No language interpreter was used.    HPI Comments:  Sally Garner is an obese 30 y.o. female with PMHx of eczema who presents to the Emergency Department complaining of skin issues to bilateral hands that began 8 days ago. She reports white bumps, darkness and peeling of the skin of the hands. She reports some purulent drainage from the white bumps, last seen two days ago. She states applying pressure to the areas causes pain but the areas itch normally. She has not done anything to treat the symptoms. She denies fever, chills, nausea, vomiting.  Denies weakness or numbness of the hands.   Past Medical History:  Diagnosis Date  . Hypertension    was put on medication end of 03/2015; states has caused headache and is not taking as prescribed  . Migraines   . Symptomatic cholelithiasis 04/2015    Patient Active Problem List   Diagnosis Date Noted  . Pleuritic chest pain 07/12/2013  . Status post primary low transverse cesarean section--FTP, NRFHR 05/26/2013  . Anemia 05/24/2013  . Smoker 05/24/2013  . Fibroid--small x 1, noted on early [place], not noted on later scans 05/24/2013  . Obesity, unspecified--BMI 37.1 05/24/2013  . Hx of primary hypertension--2-3 years ago 05/24/2013    Past Surgical History:  Procedure Laterality Date  . CESAREAN SECTION N/A 05/25/2013   Procedure: CESAREAN SECTION;  Surgeon: Annalee Genta, DO;  Location: [facility];  Service: Obstetrics;  Laterality: N/A;  . CHOLECYSTECTOMY N/A 05/05/2015   Procedure: LAPAROSCOPIC  CHOLECYSTECTOMY;  Surgeon: Coralie Keens, MD;  Location: [facility];  Service: General;  Laterality: N/A;    OB History    Gravida Para Term Preterm AB Living   1 1 1     1    SAB TAB Ectopic Multiple Live Births                   Home Medications    Prior to Admission medications   Medication Sig Start Date End Date Taking? Authorizing Provider  atenolol (TENORMIN) 25 MG tablet Take 1 tablet (25 mg total) by mouth at bedtime. 04/14/15 04/13/16  Courtney Forcucci, PA-C  predniSONE (DELTASONE) 50 MG tablet Take 1 tablet daily with breakfast 08/07/15   Monico Blitz, PA-C    Family History Family History  Problem Relation Age of Onset  . Hypertension Maternal Aunt   . Hypertension Maternal Uncle   . Birth defects Brother     polydactyl  . Cancer Paternal Aunt     breast cancer    Social History Social History  Substance Use Topics  . Smoking status: Current Some Day Smoker    Packs/day: 0.50    Years: 3.00    Types: Cigarettes  . Smokeless tobacco: Never Used  . Alcohol use Yes     Comment: occasional     Allergies   Review of patient's allergies indicates no known allergies.   Review of Systems Review of Systems  Constitutional: Negative for chills and fever.  Gastrointestinal: Negative for nausea and vomiting.  Skin: Positive  for color change and rash.  Allergic/Immunologic: Negative for immunocompromised state.  Hematological: Does not bruise/bleed easily.     Physical Exam Updated Vital Signs BP (!) 131/102 (BP Location: Left Arm)   Pulse 89   Temp 99.2 F (37.3 C) (Oral)   Resp 16   Ht 5\' 5"  (1.651 m)   Wt 226 lb 1 oz (102.5 kg)   LMP 08/18/2015   SpO2 100%   BMI 37.62 kg/m   Physical Exam  Constitutional: She appears well-developed and well-nourished. No distress.  HENT:  Head: Normocephalic and atraumatic.  Neck: Neck supple.  Pulmonary/Chest: Effort normal.  Neurological: She is alert.  Sensations intact.  Skin:  Capillary refill takes less than 2 seconds. She is not diaphoretic.  Dry, cracked skin over bilateral hands. Left fourth finger mildly tender to palpation. No fluctuation or induration. Full active ROM of all fingers.  Diffuse chronic skin changes of bilateral hands.    Nursing note and vitals reviewed.    ED Treatments / Results  Labs (all labs ordered are listed, but only abnormal results are displayed) Labs Reviewed - No data to display  EKG  EKG Interpretation None       Radiology No results found.  Procedures Procedures (including critical care time)  Medications Ordered in ED Medications - No data to display   Initial Impression / Assessment and Plan / ED Course  I have reviewed the triage vital signs and the nursing notes.  Pertinent labs & imaging results that were available during my care of the patient were reviewed by me and considered in my medical decision making (see chart for details).  Clinical Course  DIAGNOSTIC STUDIES: Oxygen Saturation is 100% on RA, normal by my interpretation.   COORDINATION OF CARE: 10:34 AM- Will prescribe steroid cream and antibiotic. Pt verbalizes understanding and agrees to plan.  Medications - No data to display   Afebrile, nontoxic patient with chronic irritation and dryness of bilateral hands with significant dry eczema-related skin changes of left 4th finger.  No active infection evident - doubt abscess, felon, paronychia.  Doubt herpetic whitlow. Pt reports purulent discharge two days ago and overall she reports great improvement.  Given the appearance of her skin with multiple openings and  tenderness, will cover with antibiotics.   D/C home with antibiotics, triamcinolone cream.  Discussed result, findings, treatment, and follow up  with patient.  Pt given return precautions.  Pt verbalizes understanding and agrees with plan.       I personally performed the services described in this documentation, which was scribed in  my presence. The recorded information has been reviewed and is accurate.    Final Clinical Impressions(s) / ED Diagnoses   Final diagnoses:  Eczema  Skin infection    New Prescriptions Discharge Medication List as of 09/13/2015 11:44 AM    START taking these medications   Details  cephALEXin (KEFLEX) 500 MG capsule Take 1 capsule (500 mg total) by mouth 4 (four) times daily., Starting Sun 09/13/2015, Print    sulfamethoxazole-trimethoprim (BACTRIM DS,SEPTRA DS) 800-160 MG tablet Take 1 tablet by mouth 2 (two) times daily., Starting Sun 09/13/2015, Until Sun 09/20/2015, Print    triamcinolone cream (KENALOG) 0.1 % Apply 1 application topically 2 (two) times daily., Starting Sun 09/13/2015, Print         [place], PA-C 09/13/15 Alicia, MD 09/13/15 [phone]

## 2015-09-13 NOTE — ED Triage Notes (Signed)
Pt. Stated, I went swimming last Saturday and my hands started to peel and be hard

## 2015-09-13 NOTE — Discharge Instructions (Signed)
Read the information below.  Use the prescribed medication as directed.  Please discuss all new medications with your pharmacist.  You may return to the Emergency Department at any time for worsening condition or any new symptoms that concern you.  If you develop redness, swelling, pus draining from the wound, worsening pain, or fevers greater than 100.4, return to the ER immediately for a recheck.

## 2015-12-18 ENCOUNTER — Emergency Department (HOSPITAL_COMMUNITY)
Admission: EM | Admit: 2015-12-18 | Discharge: 2015-12-18 | Disposition: A | Discharge status: Home / Self Care | Payer: BLUE CROSS/BLUE SHIELD | Attending: Emergency Medicine | Admitting: Emergency Medicine

## 2015-12-18 ENCOUNTER — Encounter (HOSPITAL_COMMUNITY): Payer: Self-pay | Admitting: Emergency Medicine

## 2015-12-18 DIAGNOSIS — I10 Essential (primary) hypertension: Secondary | ICD-10-CM | POA: Diagnosis not present

## 2015-12-18 DIAGNOSIS — J029 Acute pharyngitis, unspecified: Secondary | ICD-10-CM

## 2015-12-18 DIAGNOSIS — K0889 Other specified disorders of teeth and supporting structures: Secondary | ICD-10-CM | POA: Diagnosis not present

## 2015-12-18 DIAGNOSIS — F17210 Nicotine dependence, cigarettes, uncomplicated: Secondary | ICD-10-CM | POA: Insufficient documentation

## 2015-12-18 LAB — RAPID STREP SCREEN (MED CTR MEBANE ONLY): STREPTOCOCCUS, GROUP A SCREEN (DIRECT): NEGATIVE

## 2015-12-18 MED ORDER — PENICILLIN V POTASSIUM 500 MG PO TABS: 500.0000 mg | ORAL_TABLET | Freq: Four times a day (QID) | ORAL | 0 refills | Status: AC

## 2015-12-18 MED ORDER — NAPROXEN 500 MG PO TABS: 500.0000 mg | ORAL_TABLET | Freq: Two times a day (BID) | ORAL | 0 refills | Status: DC

## 2015-12-18 NOTE — ED Provider Notes (Signed)
[facility] DEPT Provider Note   By signing my name below, I, Bea Graff, attest that this documentation has been prepared under the direction and in the presence of Dezi Schaner, PA-C. Electronically Signed: Bea Graff, ED Scribe. 12/18/15. 11:12 AM.    History   Chief Complaint Chief Complaint  Patient presents with  . Sore Throat  . Dental Pain  . Headache    The history is provided by the patient and medical records. No language interpreter was used.    HPI Comments:  Sally Garner is a 30 y.o. female who presents to the Emergency Department complaining of severe, throbbing left lower dental pain that began last night. She states she was seen by her dentist a couple months ago for similar symptoms and was told she needed to have her wisdom teeth removed which she has not done yet. She reports associated HA. She reports a sore throat beginning yesterday as well but is unsure if it is related to the dental pain. She has taken Vicodin she had from a previous prescription with no significant relief of the pain. Chewing and swallowing increase the pain. She denies alleviating factors. She denies CP, SOB, cough, nausea, vomiting, fever, chills, drooling or difficulty swallowing. Her dentist is Dr. Mellissa Kohut.   Past Medical History:  Diagnosis Date  . Hypertension    was put on medication end of 03/2015; states has caused headache and is not taking as prescribed  . Migraines   . Symptomatic cholelithiasis 04/2015    Patient Active Problem List   Diagnosis Date Noted  . Pleuritic chest pain 07/12/2013  . Status post primary low transverse cesarean section--FTP, NRFHR 05/26/2013  . Anemia 05/24/2013  . Smoker 05/24/2013  . Fibroid--small x 1, noted on early [place], not noted on later scans 05/24/2013  . Obesity, unspecified--BMI 37.1 05/24/2013  . Hx of primary hypertension--2-3 years ago 05/24/2013    Past Surgical History:  Procedure Laterality Date  .  CESAREAN SECTION N/A 05/25/2013   Procedure: CESAREAN SECTION;  Surgeon: Annalee Genta, DO;  Location: [facility];  Service: Obstetrics;  Laterality: N/A;  . CHOLECYSTECTOMY N/A 05/05/2015   Procedure: LAPAROSCOPIC CHOLECYSTECTOMY;  Surgeon: Coralie Keens, MD;  Location: [facility];  Service: General;  Laterality: N/A;    OB History    Gravida Para Term Preterm AB Living   1 1 1     1    SAB TAB Ectopic Multiple Live Births           1       Home Medications    Prior to Admission medications   Medication Sig Start Date End Date Taking? Authorizing Provider  atenolol (TENORMIN) 25 MG tablet Take 1 tablet (25 mg total) by mouth at bedtime. 04/14/15 04/13/16  Courtney Forcucci, PA-C  cephALEXin (KEFLEX) 500 MG capsule Take 1 capsule (500 mg total) by mouth 4 (four) times daily. 09/13/15   Clayton Bibles, PA-C  predniSONE (DELTASONE) 50 MG tablet Take 1 tablet daily with breakfast 08/07/15   Elmyra Ricks Pisciotta, PA-C  triamcinolone cream (KENALOG) 0.1 % Apply 1 application topically 2 (two) times daily. 09/13/15   Clayton Bibles, PA-C    Family History Family History  Problem Relation Age of Onset  . Hypertension Maternal Aunt   . Hypertension Maternal Uncle   . Birth defects Brother     polydactyl  . Cancer Paternal Aunt     breast cancer    Social History Social History  Substance Use Topics  .  Smoking status: Current Some Day Smoker    Packs/day: 0.50    Years: 3.00    Types: Cigarettes  . Smokeless tobacco: Never Used  . Alcohol use Yes     Comment: occasional     Allergies   Review of patient's allergies indicates no known allergies.   Review of Systems Review of Systems A complete 10 system review of systems was obtained and all systems are negative except as noted in the HPI and PMH.    Physical Exam Updated Vital Signs BP (!) 148/109 (BP Location: Right Arm)   Pulse 83   Temp 98.7 F (37.1 C) (Oral)   Resp 17   SpO2 98%   Physical Exam    Constitutional: She is oriented to person, place, and time. She appears well-developed and well-nourished.  HENT:  Head: Normocephalic and atraumatic.  Right Ear: Tympanic membrane, external ear and ear canal normal.  Left Ear: Tympanic membrane, external ear and ear canal normal.  Mouth/Throat: Uvula is midline and mucous membranes are normal. No trismus in the jaw. No dental abscesses. Posterior oropharyngeal erythema present.  Tenderness to palpation of left lower gingiva and left lower wisdom tooth. No trismus. No sublingual tenderness or swelling. Mild erythema of posterior oropharynx. No obvious dental abscess.  Neck: Normal range of motion.  Cardiovascular: Normal rate, regular rhythm and normal heart sounds.  Exam reveals no gallop and no friction rub.   No murmur heard. Pulmonary/Chest: Effort normal and breath sounds normal. No respiratory distress. She has no wheezes. She has no rales.  Musculoskeletal: Normal range of motion.  Lymphadenopathy:       Head (right side): No submental and no submandibular adenopathy present.       Head (left side): No submental and no submandibular adenopathy present.    She has no cervical adenopathy.  No submental or submandibular lymphadenopathy.  Neurological: She is alert and oriented to person, place, and time.  Skin: Skin is warm and dry.  Psychiatric: She has a normal mood and affect. Her behavior is normal.  Nursing note and vitals reviewed.    ED Treatments / Results  DIAGNOSTIC STUDIES: Oxygen Saturation is 98% on RA, normal by my interpretation.   COORDINATION OF CARE: 11:11 AM- Will prescribe antibiotic and NSAID. Advised pt to follow up with her dentist. Return precautions discussed. Pt verbalizes understanding and agrees to plan.  Medications - No data to display   Labs (all labs ordered are listed, but only abnormal results are displayed) Labs Reviewed  RAPID STREP SCREEN (NOT AT [facility])    EKG  EKG  Interpretation None       Radiology No results found.  Procedures Procedures (including critical care time)  Medications Ordered in ED Medications - No data to display   Initial Impression / Assessment and Plan / ED Course  I have reviewed the triage vital signs and the nursing notes.  Pertinent labs & imaging results that were available during my care of the patient were reviewed by me and considered in my medical decision making (see chart for details).  Clinical Course    Patient with dentalgia.  No abscess requiring immediate incision and drainage.  Exam not concerning for Ludwig's angina or pharyngeal abscess.  Will treat with PCN and Naproxen. Pt instructed to follow-up with dentist.  Discussed return precautions. Pt safe for discharge.  I personally performed the services described in this documentation, which was scribed in my presence. The recorded information has been reviewed and  is accurate.    Final Clinical Impressions(s) / ED Diagnoses   Final diagnoses:  None    New Prescriptions New Prescriptions   No medications on file     Hyman Bible, PA-C 12/21/15 [place], MD 12/23/15 1327

## 2015-12-18 NOTE — ED Notes (Signed)
[facility] COUPON 172 GIVEN

## 2015-12-18 NOTE — ED Notes (Signed)
C/o left lower dental pain for "a while".  C/o sore throat and headache since last pm.

## 2015-12-20 LAB — CULTURE, GROUP A STREP (THRC)

## 2016-05-28 ENCOUNTER — Encounter (HOSPITAL_COMMUNITY): Payer: Self-pay | Admitting: *Deleted

## 2016-05-28 ENCOUNTER — Emergency Department (HOSPITAL_COMMUNITY)
Admission: EM | Admit: 2016-05-28 | Discharge: 2016-05-28 | Disposition: A | Discharge status: Home / Self Care | Payer: BLUE CROSS/BLUE SHIELD | Attending: Emergency Medicine | Admitting: Emergency Medicine

## 2016-05-28 DIAGNOSIS — K0889 Other specified disorders of teeth and supporting structures: Secondary | ICD-10-CM | POA: Diagnosis present

## 2016-05-28 DIAGNOSIS — R21 Rash and other nonspecific skin eruption: Secondary | ICD-10-CM | POA: Diagnosis not present

## 2016-05-28 DIAGNOSIS — I10 Essential (primary) hypertension: Secondary | ICD-10-CM | POA: Insufficient documentation

## 2016-05-28 DIAGNOSIS — F17210 Nicotine dependence, cigarettes, uncomplicated: Secondary | ICD-10-CM | POA: Diagnosis not present

## 2016-05-28 MED ORDER — TRIAMCINOLONE ACETONIDE 0.1 % EX CREA: 1.0000 "application " | TOPICAL_CREAM | Freq: Two times a day (BID) | CUTANEOUS | 0 refills | Status: DC

## 2016-05-28 MED ORDER — PENICILLIN V POTASSIUM 500 MG PO TABS: 500.0000 mg | ORAL_TABLET | Freq: Four times a day (QID) | ORAL | 0 refills | Status: AC

## 2016-05-28 NOTE — ED Provider Notes (Signed)
[facility] DEPT Provider Note   CSN: [id number] Arrival date & time: 05/28/16  1405   By signing my name below, I, Soijett Blue, attest that this documentation has been prepared under the direction and in the presence of Rodell Perna, PA-C Electronically Signed: Soijett Blue, ED Scribe. 05/28/16. 4:18 PM.  History   Chief Complaint Chief Complaint  Patient presents with  . Dental Pain  . Rash    HPI Sally Garner is a 31 y.o. female with a PMHx of HTN, who presents to the Emergency Department complaining of 8.5/10, intermittent, sharp left sided dental pain onset 2 months worsening last night. Pt has tried ibuprofen, tylenol #3, and excedrin extra strength with no relief of her symptoms. Pt states that her left sided dental pain is worsened with chewing and laying on the affected side. She notes that her wisdom tooth to her left side is cracked and that there is a hole to the affected tooth. Pt reports that she has been evaluated for her left sided dental pain in the past and prescribed tylenol #3 and abx. She states that she has an upcoming dental appointment on 06/16/2016. She denies fever, chills, drainage, facial swelling, trouble swallowing, sore throat, CP, SOB, abdominal pain, nausea, vomiting, and any other symptoms.   Pt secondarily complains of pruritic, non-painful, rash localized to bilateral arms onset 4 days ago. Pt has not tried any medications for the relief of her symptoms. She states that she has a hx of eczema that typically presents to her BUE following exposure to heat and is treated with steroids. Pt notes that she went to the beach prior to the onset of her symptoms. Denies new detergents or soaps. Denies ear pain, rhinorrhea, sinus pain, and any other symptoms.     The history is provided by the patient. No language interpreter was used.    Past Medical History:  Diagnosis Date  . Hypertension    was put on medication end of 03/2015; states has caused headache  and is not taking as prescribed  . Migraines   . Symptomatic cholelithiasis 04/2015    Patient Active Problem List   Diagnosis Date Noted  . Pleuritic chest pain 07/12/2013  . Status post primary low transverse cesarean section--FTP, NRFHR 05/26/2013  . Anemia 05/24/2013  . Smoker 05/24/2013  . Fibroid--small x 1, noted on early [place], not noted on later scans 05/24/2013  . Obesity, unspecified--BMI 37.1 05/24/2013  . Hx of primary hypertension--2-3 years ago 05/24/2013    Past Surgical History:  Procedure Laterality Date  . CESAREAN SECTION N/A 05/25/2013   Procedure: CESAREAN SECTION;  Surgeon: Annalee Genta, DO;  Location: [facility];  Service: Obstetrics;  Laterality: N/A;  . CHOLECYSTECTOMY N/A 05/05/2015   Procedure: LAPAROSCOPIC CHOLECYSTECTOMY;  Surgeon: Coralie Keens, MD;  Location: [facility];  Service: General;  Laterality: N/A;    OB History    Gravida Para Term Preterm AB Living   1 1 1     1    SAB TAB Ectopic Multiple Live Births           1       Home Medications    Prior to Admission medications   Medication Sig Start Date End Date Taking? Authorizing Provider  atenolol (TENORMIN) 25 MG tablet Take 1 tablet (25 mg total) by mouth at bedtime. 04/14/15 04/13/16  Courtney Forcucci, PA-C  cephALEXin (KEFLEX) 500 MG capsule Take 1 capsule (500 mg total) by mouth 4 (four) times daily. 09/13/15  Clayton Bibles, PA-C  naproxen (NAPROSYN) 500 MG tablet Take 1 tablet (500 mg total) by mouth 2 (two) times daily. 12/18/15   Heather Laisure, PA-C  penicillin v potassium (VEETID) 500 MG tablet Take 1 tablet (500 mg total) by mouth 4 (four) times daily. 05/28/16 06/04/16  Matthe Sloane A [place], PA-C  predniSONE (DELTASONE) 50 MG tablet Take 1 tablet daily with breakfast 08/07/15   Elmyra Ricks Pisciotta, PA-C  triamcinolone cream (KENALOG) 0.1 % Apply 1 application topically 2 (two) times daily. 05/28/16   Renita Papa, PA-C    Family History Family History  Problem Relation Age of  Onset  . Hypertension Maternal Aunt   . Hypertension Maternal Uncle   . Birth defects Brother     polydactyl  . Cancer Paternal Aunt     breast cancer    Social History Social History  Substance Use Topics  . Smoking status: Current Some Day Smoker    Packs/day: 0.50    Years: 3.00    Types: Cigarettes  . Smokeless tobacco: Never Used  . Alcohol use Yes     Comment: occasional     Allergies   Patient has no known allergies.   Review of Systems Review of Systems  Constitutional: Negative for chills and fever.  HENT: Positive for dental problem (left sided). Negative for ear pain, facial swelling, rhinorrhea, sinus pain, sore throat and trouble swallowing.        No drainage.   Respiratory: Negative for shortness of breath.   Cardiovascular: Negative for chest pain.  Gastrointestinal: Negative for abdominal pain, nausea and vomiting.  Genitourinary: Negative for dysuria and hematuria.  Skin: Positive for rash (localized to BUE).  Neurological: Negative for dizziness, numbness and headaches.     Physical Exam Updated Vital Signs BP (!) 134/102 (BP Location: Right Arm)   Pulse 87   Temp 98.7 F (37.1 C) (Oral)   Resp 16   LMP 04/27/2016   SpO2 99%   Physical Exam  Constitutional: She is oriented to person, place, and time. She appears well-developed and well-nourished. No distress.  HENT:  Head: Normocephalic and atraumatic.  Cracked dentition of left lower third molar, multiple dental carries noted throughout dentition, no fluctuance of buccal mucosa, no drainage, gingiva pink and do not appear inflamed. No tonsillar hypertrophy, exudates, or posterior pharyngeal erythema . No facial swelling.   Eyes: EOM are normal. Right eye exhibits no discharge. Left eye exhibits no discharge. No scleral icterus.  Neck: Neck supple. No JVD present. No tracheal deviation present.  Cardiovascular: Normal rate.   Pulmonary/Chest: Effort normal.  Abdominal: She exhibits no  distension.  Musculoskeletal: Normal range of motion.  Lymphadenopathy:    She has no cervical adenopathy.  Neurological: She is alert and oriented to person, place, and time.  Fluent speech, no facial droop  Skin: Skin is warm and dry. Capillary refill takes less than 2 seconds. Rash noted.  Skin dryness of BUE, primarily localized in hands. Lichenification of dorsum of hands. No erythema, drainage.  Psychiatric: She has a normal mood and affect. Her behavior is normal.  Nursing note and vitals reviewed.    ED Treatments / Results  DIAGNOSTIC STUDIES: Oxygen Saturation is 99% on RA, nl by my interpretation.    COORDINATION OF CARE: 4:13 PM Discussed treatment plan with pt at bedside and pt agreed to plan.   Procedures Procedures (including critical care time)  Medications Ordered in ED Medications - No data to display   Initial Impression /  Assessment and Plan / ED Course  I have reviewed the triage vital signs and the nursing notes.   30yof presents to ED with chief complaint left sided dental pain and rash along BUE. Pt afebrile, VS baseline for pt. Patient with dentalgia. No abscess requiring immediate incision and drainage.  Exam not concerning for Ludwig's angina or pharyngeal abscess.  Will treat with Penicillin v K Rx. Pt instructed to follow-up with dentist as scheduled. Rash consistent with atopic dermatitis. Patient denies any difficulty breathing or swallowing.  Pt has a patent airway without stridor and is handling secretions without difficulty; no angioedema. No blisters, no pustules, no warmth, no superficial abscesses, no bullous impetigo, no vesicles, no target lesions with dusky purpura or a central bulla. Not tender to touch. No concern for superimposed infection. No concern for SJS, TEN, TSS, tick borne illness, syphilis or other life-threatening condition. Will discharge home with topical steroids which have worked for pt in the past. Discussed return precautions.  Pt safe for discharge and verbalized understanding of and agreement with plan.  Final Clinical Impressions(s) / ED Diagnoses   Final diagnoses:  Rash  Pain, dental    New Prescriptions Discharge Medication List as of 05/28/2016  4:20 PM    START taking these medications   Details  penicillin v potassium (VEETID) 500 MG tablet Take 1 tablet (500 mg total) by mouth 4 (four) times daily., Starting Sat 05/28/2016, Until Sat 06/04/2016, Print       I personally performed the services described in this documentation, which was scribed in my presence. The recorded information has been reviewed and is accurate.     Renita Papa, PA-C 05/28/16 1743    Julianne Rice, MD 05/28/16 2239

## 2016-05-28 NOTE — ED Triage Notes (Signed)
Pt reports left side dental pain, no relief with pain meds at home. Pt also having rash and itching to her arms.

## 2016-05-28 NOTE — ED Triage Notes (Signed)
AT time of DC Pt reported she was in pain but refused ibuprofen .

## 2016-05-28 NOTE — ED Notes (Signed)
Declined W/C at D/C and was escorted to lobby by RN. 

## 2016-11-19 ENCOUNTER — Inpatient Hospital Stay (HOSPITAL_COMMUNITY): Payer: BLUE CROSS/BLUE SHIELD

## 2016-11-19 ENCOUNTER — Encounter (HOSPITAL_COMMUNITY): Payer: Self-pay

## 2016-11-19 ENCOUNTER — Inpatient Hospital Stay (HOSPITAL_COMMUNITY)
Admission: AD | Admit: 2016-11-19 | Discharge: 2016-11-19 | Disposition: A | Discharge status: Home / Self Care | Payer: BLUE CROSS/BLUE SHIELD | Source: Ambulatory Visit | Attending: Obstetrics and Gynecology | Admitting: Obstetrics and Gynecology

## 2016-11-19 DIAGNOSIS — O24911 Unspecified diabetes mellitus in pregnancy, first trimester: Secondary | ICD-10-CM | POA: Insufficient documentation

## 2016-11-19 DIAGNOSIS — O161 Unspecified maternal hypertension, first trimester: Secondary | ICD-10-CM | POA: Insufficient documentation

## 2016-11-19 DIAGNOSIS — R109 Unspecified abdominal pain: Secondary | ICD-10-CM | POA: Diagnosis present

## 2016-11-19 DIAGNOSIS — O24119 Pre-existing diabetes mellitus, type 2, in pregnancy, unspecified trimester: Secondary | ICD-10-CM | POA: Diagnosis not present

## 2016-11-19 DIAGNOSIS — Z3A01 Less than 8 weeks gestation of pregnancy: Secondary | ICD-10-CM | POA: Insufficient documentation

## 2016-11-19 DIAGNOSIS — O26891 Other specified pregnancy related conditions, first trimester: Secondary | ICD-10-CM | POA: Insufficient documentation

## 2016-11-19 DIAGNOSIS — O99331 Smoking (tobacco) complicating pregnancy, first trimester: Secondary | ICD-10-CM | POA: Diagnosis not present

## 2016-11-19 DIAGNOSIS — Z3490 Encounter for supervision of normal pregnancy, unspecified, unspecified trimester: Secondary | ICD-10-CM

## 2016-11-19 DIAGNOSIS — O3680X0 Pregnancy with inconclusive fetal viability, not applicable or unspecified: Secondary | ICD-10-CM

## 2016-11-19 LAB — URINALYSIS, ROUTINE W REFLEX MICROSCOPIC
BILIRUBIN URINE: NEGATIVE
Glucose, UA: NEGATIVE mg/dL
Hgb urine dipstick: NEGATIVE
KETONES UR: NEGATIVE mg/dL
Leukocytes, UA: NEGATIVE
NITRITE: NEGATIVE
Protein, ur: NEGATIVE mg/dL
SPECIFIC GRAVITY, URINE: 1.003 — AB (ref 1.005–1.030)
pH: 7 (ref 5.0–8.0)

## 2016-11-19 LAB — HCG, QUANTITATIVE, PREGNANCY: hCG, Beta Chain, Quant, S: 6449 m[IU]/mL — ABNORMAL HIGH

## 2016-11-19 LAB — RAPID URINE DRUG SCREEN, HOSP PERFORMED
Amphetamines: NOT DETECTED
Barbiturates: NOT DETECTED
Benzodiazepines: NOT DETECTED
COCAINE: NOT DETECTED
OPIATES: NOT DETECTED
Tetrahydrocannabinol: NOT DETECTED

## 2016-11-19 LAB — POCT PREGNANCY, URINE: Preg Test, Ur: POSITIVE — AB

## 2016-11-19 NOTE — Discharge Instructions (Signed)
Abdominal Pain During Pregnancy Abdominal pain is common in pregnancy. Most of the time, it does not cause harm. There are many causes of abdominal pain. Some causes are more serious than others and sometimes the cause is not known. Abdominal pain can be a sign that something is very wrong with the pregnancy or the pain may have nothing to do with the pregnancy. Always tell your health care provider if you have any abdominal pain. Follow these instructions at home:  Do not have sex or put anything in your vagina until your symptoms go away completely.  Watch your abdominal pain for any changes.  Get plenty of rest until your pain improves.  Drink enough fluid to keep your urine clear or pale yellow.  Take over-the-counter or prescription medicines only as told by your health care provider.  Keep all follow-up visits as told by your health care provider. This is important. Contact a health care provider if:  You have a fever.  Your pain gets worse or you have cramping.  Your pain continues after resting. Get help right away if:  You are bleeding, leaking fluid, or passing tissue from the vagina.  You have vomiting or diarrhea that does not go away.  You have painful or bloody urination.  You notice a decrease in your baby's movements.  You feel very weak or faint.  You have shortness of breath.  You develop a severe headache with abdominal pain.  You have abnormal vaginal discharge with abdominal pain. This information is not intended to replace advice given to you by your health care provider. Make sure you discuss any questions you have with your health care provider. Document Released: 02/07/2005 Document Revised: 11/19/2015 Document Reviewed: 09/06/2012 Elsevier Interactive Patient Education  2018 [place] of Pregnancy The first trimester of pregnancy is from week 1 until the end of week 13 (months 1 through 3). A week after a sperm fertilizes an  egg, the egg will implant on the wall of the uterus. This embryo will begin to develop into a baby. Genes from you and your partner will form the baby. The female genes will determine whether the baby will be a boy or a girl. At 6-8 weeks, the eyes and face will be formed, and the heartbeat can be seen on ultrasound. At the end of 12 weeks, all the baby's organs will be formed. Now that you are pregnant, you will want to do everything you can to have a healthy baby. Two of the most important things are to get good prenatal care and to follow your health care provider's instructions. Prenatal care is all the medical care you receive before the baby's birth. This care will help prevent, find, and treat any problems during the pregnancy and childbirth. Body changes during your first trimester Your body goes through many changes during pregnancy. The changes vary from woman to woman.  You may gain or lose a couple of pounds at first.  You may feel sick to your stomach (nauseous) and you may throw up (vomit). If the vomiting is uncontrollable, call your health care provider.  You may tire easily.  You may develop headaches that can be relieved by medicines. All medicines should be approved by your health care provider.  You may urinate more often. Painful urination may mean you have a bladder infection.  You may develop heartburn as a result of your pregnancy.  You may develop constipation because certain hormones are causing the muscles  that push stool through your intestines to slow down.  You may develop hemorrhoids or swollen veins (varicose veins).  Your breasts may begin to grow larger and become tender. Your nipples may stick out more, and the tissue that surrounds them (areola) may become darker.  Your gums may bleed and may be sensitive to brushing and flossing.  Dark spots or blotches (chloasma, mask of pregnancy) may develop on your face. This will likely fade after the baby is  born.  Your menstrual periods will stop.  You may have a loss of appetite.  You may develop cravings for certain kinds of food.  You may have changes in your emotions from day to day, such as being excited to be pregnant or being concerned that something may go wrong with the pregnancy and baby.  You may have more vivid and strange dreams.  You may have changes in your hair. These can include thickening of your hair, rapid growth, and changes in texture. Some women also have hair loss during or after pregnancy, or hair that feels dry or thin. Your hair will most likely return to normal after your baby is born.  What to expect at prenatal visits During a routine prenatal visit:  You will be weighed to make sure you and the baby are growing normally.  Your blood pressure will be taken.  Your abdomen will be measured to track your baby's growth.  The fetal heartbeat will be listened to between weeks 10 and 14 of your pregnancy.  Test results from any previous visits will be discussed.  Your health care provider may ask you:  How you are feeling.  If you are feeling the baby move.  If you have had any abnormal symptoms, such as leaking fluid, bleeding, severe headaches, or abdominal cramping.  If you are using any tobacco products, including cigarettes, chewing tobacco, and electronic cigarettes.  If you have any questions.  Other tests that may be performed during your first trimester include:  Blood tests to find your blood type and to check for the presence of any previous infections. The tests will also be used to check for low iron levels (anemia) and protein on red blood cells (Rh antibodies). Depending on your risk factors, or if you previously had diabetes during pregnancy, you may have tests to check for high blood sugar that affects pregnant women (gestational diabetes).  Urine tests to check for infections, diabetes, or protein in the urine.  An ultrasound to  confirm the proper growth and development of the baby.  Fetal screens for spinal cord problems (spina bifida) and Down syndrome.  HIV (human immunodeficiency virus) testing. Routine prenatal testing includes screening for HIV, unless you choose not to have this test.  You may need other tests to make sure you and the baby are doing well.  Follow these instructions at home: Medicines  Follow your health care provider's instructions regarding medicine use. Specific medicines may be either safe or unsafe to take during pregnancy.  Take a prenatal vitamin that contains at least 600 micrograms (mcg) of folic acid.  If you develop constipation, try taking a stool softener if your health care provider approves. Eating and drinking  Eat a balanced diet that includes fresh fruits and vegetables, whole grains, good sources of protein such as meat, eggs, or tofu, and low-fat dairy. Your health care provider will help you determine the amount of weight gain that is right for you.  Avoid raw meat and uncooked cheese.  These carry germs that can cause birth defects in the baby.  Eating four or five small meals rather than three large meals a day may help relieve nausea and vomiting. If you start to feel nauseous, eating a few soda crackers can be helpful. Drinking liquids between meals, instead of during meals, also seems to help ease nausea and vomiting.  Limit foods that are high in fat and processed sugars, such as fried and sweet foods.  To prevent constipation: ? Eat foods that are high in fiber, such as fresh fruits and vegetables, whole grains, and beans. ? Drink enough fluid to keep your urine clear or pale yellow. Activity  Exercise only as directed by your health care provider. Most women can continue their usual exercise routine during pregnancy. Try to exercise for 30 minutes at least 5 days a week. Exercising will help you: ? Control your weight. ? Stay in shape. ? Be prepared for  labor and delivery.  Experiencing pain or cramping in the lower abdomen or lower back is a good sign that you should stop exercising. Check with your health care provider before continuing with normal exercises.  Try to avoid standing for long periods of time. Move your legs often if you must stand in one place for a long time.  Avoid heavy lifting.  Wear low-heeled shoes and practice good posture.  You may continue to have sex unless your health care provider tells you not to. Relieving pain and discomfort  Wear a good support bra to relieve breast tenderness.  Take warm sitz baths to soothe any pain or discomfort caused by hemorrhoids. Use hemorrhoid cream if your health care provider approves.  Rest with your legs elevated if you have leg cramps or low back pain.  If you develop varicose veins in your legs, wear support hose. Elevate your feet for 15 minutes, 3-4 times a day. Limit salt in your diet. Prenatal care  Schedule your prenatal visits by the twelfth week of pregnancy. They are usually scheduled monthly at first, then more often in the last 2 months before delivery.  Write down your questions. Take them to your prenatal visits.  Keep all your prenatal visits as told by your health care provider. This is important. Safety  Wear your seat belt at all times when driving.  Make a list of emergency phone numbers, including numbers for family, friends, the hospital, and police and fire departments. General instructions  Ask your health care provider for a referral to a local prenatal education class. Begin classes no later than the beginning of month 6 of your pregnancy.  Ask for help if you have counseling or nutritional needs during pregnancy. Your health care provider can offer advice or refer you to specialists for help with various needs.  Do not use hot tubs, steam rooms, or saunas.  Do not douche or use tampons or scented sanitary pads.  Do not cross your legs  for long periods of time.  Avoid cat litter boxes and soil used by cats. These carry germs that can cause birth defects in the baby and possibly loss of the fetus by miscarriage or stillbirth.  Avoid all smoking, herbs, alcohol, and medicines not prescribed by your health care provider. Chemicals in these products affect the formation and growth of the baby.  Do not use any products that contain nicotine or tobacco, such as cigarettes and e-cigarettes. If you need help quitting, ask your health care provider. You may receive counseling support and  other resources to help you quit.  Schedule a dentist appointment. At home, brush your teeth with a soft toothbrush and be gentle when you floss. Contact a health care provider if:  You have dizziness.  You have mild pelvic cramps, pelvic pressure, or nagging pain in the abdominal area.  You have persistent nausea, vomiting, or diarrhea.  You have a bad smelling vaginal discharge.  You have pain when you urinate.  You notice increased swelling in your face, hands, legs, or ankles.  You are exposed to fifth disease or chickenpox.  You are exposed to [place] measles (rubella) and have never had it. Get help right away if:  You have a fever.  You are leaking fluid from your vagina.  You have spotting or bleeding from your vagina.  You have severe abdominal cramping or pain.  You have rapid weight gain or loss.  You vomit blood or material that looks like coffee grounds.  You develop a severe headache.  You have shortness of breath.  You have any kind of trauma, such as from a fall or a car accident. Summary  The first trimester of pregnancy is from week 1 until the end of week 13 (months 1 through 3).  Your body goes through many changes during pregnancy. The changes vary from woman to woman.  You will have routine prenatal visits. During those visits, your health care provider will examine you, discuss any test results you  may have, and talk with you about how you are feeling. This information is not intended to replace advice given to you by your health care provider. Make sure you discuss any questions you have with your health care provider. Document Released: 02/01/2001 Document Revised: 01/20/2016 Document Reviewed: 01/20/2016 Elsevier Interactive Patient Education  2017 [employer].

## 2016-11-19 NOTE — MAU Provider Note (Signed)
History   G2P1001 @ 5.2 wks in with low abd pain since Wednesday. Pain is intermittent in nature. Pt is adult onset diabetic on metformin.  CSN: [id number]  Arrival date & time 11/19/16  1020   None     Chief Complaint  Patient presents with  . Abdominal Pain    HPI  Past Medical History:  Diagnosis Date  . Hypertension    was put on medication end of 03/2015; states has caused headache and is not taking as prescribed  . Migraines   . Symptomatic cholelithiasis 04/2015    Past Surgical History:  Procedure Laterality Date  . CESAREAN SECTION N/A 05/25/2013   Procedure: CESAREAN SECTION;  Surgeon: Annalee Genta, DO;  Location: [facility];  Service: Obstetrics;  Laterality: N/A;  . CHOLECYSTECTOMY N/A 05/05/2015   Procedure: LAPAROSCOPIC CHOLECYSTECTOMY;  Surgeon: Coralie Keens, MD;  Location: [facility];  Service: General;  Laterality: N/A;    Family History  Problem Relation Age of Onset  . Hypertension Maternal Aunt   . Hypertension Maternal Uncle   . Birth defects Brother        polydactyl  . Cancer Paternal Aunt        breast cancer    Social History  Substance Use Topics  . Smoking status: Current Some Day Smoker    Packs/day: 0.50    Years: 3.00    Types: Cigarettes  . Smokeless tobacco: Never Used  . Alcohol use Yes     Comment: occasional    OB History    Gravida Para Term Preterm AB Living   2 1 1     1    SAB TAB Ectopic Multiple Live Births           1      Review of Systems  Constitutional: Negative.   HENT: Negative.   Eyes: Negative.   Respiratory: Negative.   Cardiovascular: Negative.   Gastrointestinal: Positive for abdominal pain.  Endocrine: Negative.   Genitourinary: Negative.   Musculoskeletal: Negative.   Skin: Negative.   Allergic/Immunologic: Negative.   Neurological: Negative.   Hematological: Negative.   Psychiatric/Behavioral: Negative.     Allergies  Patient has no known allergies.  Home Medications     BP 139/85 (BP Location: Right Arm)   Pulse 86   Temp 98.5 F (36.9 C) (Oral)   Resp 16   Wt 234 lb 12 oz (106.5 kg)   LMP 10/13/2016   SpO2 100%   BMI 39.06 kg/m   Physical Exam  Constitutional: She is oriented to person, place, and time. She appears well-developed and well-nourished.  HENT:  Head: Normocephalic.  Eyes: Pupils are equal, round, and reactive to light.  Neck: Normal range of motion.  Cardiovascular: Normal rate, regular rhythm, normal heart sounds and intact distal pulses.   Pulmonary/Chest: Effort normal and breath sounds normal.  Abdominal: Soft. Bowel sounds are normal.  Genitourinary: Vagina normal and uterus normal.  Musculoskeletal: Normal range of motion.  Neurological: She is alert and oriented to person, place, and time. She has normal reflexes.  Skin: Skin is warm and dry.  Psychiatric: She has a normal mood and affect. Her behavior is normal. Judgment and thought content normal.    MAU Course  Procedures (including critical care time)  Labs Reviewed  URINALYSIS, ROUTINE W REFLEX MICROSCOPIC - Abnormal; Notable for the following:       Result Value   Color, Urine STRAW (*)    Specific Gravity, Urine 1.003 (*)  All other components within normal limits  POCT PREGNANCY, URINE - Abnormal; Notable for the following:    Preg Test, Ur POSITIVE (*)    All other components within normal limits  RAPID URINE DRUG SCREEN, HOSP PERFORMED  HCG, QUANTITATIVE, PREGNANCY   No results found.   1. Pre-existing type 2 diabetes mellitus during pregnancy, antepartum   2. Abdominal pain in pregnancy, first trimester   3. Pregnancy of unknown anatomic location       MDM  VSS, US shows early IUP. Encouraged pt to start [facility] ASAP that she was considered high risk secondary to the diabetes. Will d/c home in stable condition. Pt stees she will f/u with [place] for further care.

## 2016-11-19 NOTE — MAU Note (Signed)
Lower stomach cramps,  Started Wed night, off and on.  Cycle is late, has not done a test.  Denies any GI or GU problems

## 2016-11-22 ENCOUNTER — Inpatient Hospital Stay (HOSPITAL_COMMUNITY)
Admission: AD | Admit: 2016-11-22 | Discharge: 2016-11-22 | Disposition: A | Discharge status: Home / Self Care | Payer: BLUE CROSS/BLUE SHIELD | Source: Ambulatory Visit | Attending: Family Medicine | Admitting: Family Medicine

## 2016-11-22 ENCOUNTER — Encounter (HOSPITAL_COMMUNITY): Payer: Self-pay | Admitting: *Deleted

## 2016-11-22 DIAGNOSIS — O99331 Smoking (tobacco) complicating pregnancy, first trimester: Secondary | ICD-10-CM | POA: Diagnosis not present

## 2016-11-22 DIAGNOSIS — O161 Unspecified maternal hypertension, first trimester: Secondary | ICD-10-CM | POA: Diagnosis not present

## 2016-11-22 DIAGNOSIS — R141 Gas pain: Secondary | ICD-10-CM | POA: Insufficient documentation

## 2016-11-22 DIAGNOSIS — O99611 Diseases of the digestive system complicating pregnancy, first trimester: Secondary | ICD-10-CM | POA: Insufficient documentation

## 2016-11-22 DIAGNOSIS — R1032 Left lower quadrant pain: Secondary | ICD-10-CM | POA: Diagnosis present

## 2016-11-22 DIAGNOSIS — Z3A01 Less than 8 weeks gestation of pregnancy: Secondary | ICD-10-CM | POA: Diagnosis not present

## 2016-11-22 DIAGNOSIS — K5900 Constipation, unspecified: Secondary | ICD-10-CM | POA: Diagnosis not present

## 2016-11-22 DIAGNOSIS — Z3490 Encounter for supervision of normal pregnancy, unspecified, unspecified trimester: Secondary | ICD-10-CM

## 2016-11-22 DIAGNOSIS — O9989 Other specified diseases and conditions complicating pregnancy, childbirth and the puerperium: Secondary | ICD-10-CM

## 2016-11-22 LAB — URINALYSIS, ROUTINE W REFLEX MICROSCOPIC
Bilirubin Urine: NEGATIVE
GLUCOSE, UA: NEGATIVE mg/dL
Hgb urine dipstick: NEGATIVE
KETONES UR: NEGATIVE mg/dL
LEUKOCYTES UA: NEGATIVE
NITRITE: NEGATIVE
PROTEIN: NEGATIVE mg/dL
Specific Gravity, Urine: 1.02 (ref 1.005–1.030)
pH: 6 (ref 5.0–8.0)

## 2016-11-22 MED ORDER — SIMETHICONE 80 MG PO CHEW
160.0000 mg | CHEWABLE_TABLET | Freq: Once | ORAL | Status: AC
  Administered 2016-11-22: 160 mg via ORAL
  Filled 2016-11-22: qty 2

## 2016-11-22 NOTE — MAU Provider Note (Signed)
Sally Garner is a 31 y.o. female patient that presents with complaints of left lower abdominal pain x 5 days. She states that it is intermittent. She rates the pain 3/10. Patient was evaluated at MAU on Saturday for a cramping pain and was discharged. The pain has progressed from intermittent cramping to intermittent sharp pains. She has not tried any medications at home but did state that changing positions helps the pain. No aggravating factors.     Past Medical History:  Diagnosis Date  . Hypertension    was put on medication end of 03/2015; states has caused headache and is not taking as prescribed  . Migraines   . Symptomatic cholelithiasis 04/2015    No current facility-administered medications for this encounter.    No Known Allergies Active Problems:   * No active hospital problems. *  Blood pressure 126/77, pulse 82, temperature 98.3 F (36.8 C), temperature source Oral, resp. rate 17, height 5\' 5"  (1.651 m), weight 231 lb (104.8 kg), last menstrual period 10/13/2016, SpO2 100 %.  Review of Systems  Constitutional: Negative for chills and fever.  Cardiovascular: Negative for leg swelling.  Gastrointestinal: Positive for abdominal pain. Negative for constipation, diarrhea, nausea and vomiting.       Pt endorses needing to drink more water.  Genitourinary: Negative for dysuria, frequency, hematuria and urgency.    Physical Exam  Constitutional: She is oriented to person, place, and time. She appears well-developed and well-nourished. No distress.  HENT:  Head: Normocephalic.  Cardiovascular: Normal rate, regular rhythm, normal heart sounds and intact distal pulses.   No murmur heard. Abdominal: Bowel sounds are normal. She exhibits no distension. There is no tenderness.  Musculoskeletal: She exhibits no edema.  Neurological: She is alert and oriented to person, place, and time.  Skin: Skin is warm and dry.  Psychiatric: She has a normal mood and affect.   Results for  orders placed or performed during the hospital encounter of 11/22/16 (from the past 24 hour(s))  Urinalysis, Routine w reflex microscopic     Status: Abnormal   Collection Time: 11/22/16  9:35 AM  Result Value Ref Range   Color, Urine YELLOW YELLOW   APPearance HAZY (A) CLEAR   Specific Gravity, Urine 1.020 1.005 - 1.030   pH 6.0 5.0 - 8.0   Glucose, UA NEGATIVE NEGATIVE mg/dL   Hgb urine dipstick NEGATIVE NEGATIVE   Bilirubin Urine NEGATIVE NEGATIVE   Ketones, ur NEGATIVE NEGATIVE mg/dL   Protein, ur NEGATIVE NEGATIVE mg/dL   Nitrite NEGATIVE NEGATIVE   Leukocytes, UA NEGATIVE NEGATIVE   Patient has had simethicone, and reports that she is feeling better.   ASSESSMENT/PLAN:   1. Constipation, unspecified constipation type   2. Gas pain   3. [redacted] weeks gestation of pregnancy   4. Intrauterine pregnancy    DC home Comfort measures reviewed  1st Trimester precautions  RX: none, otc med list given  Return to MAU as needed FU with OB as planned  [place] for [place] Follow up.   Specialty:  Obstetrics and Gynecology Contact information: [place] 11/22/2016   I confirm that I have verified the information documented in the nurse practitioner student's note and that I have also personally reperformed the physical exam and all medical decision making activities.   Marcille Buffy 11:40 AM 11/22/16

## 2016-11-22 NOTE — MAU Provider Note (Signed)
History     CSN: [id number]  Arrival date and time: 11/22/16 1884   First Provider Initiated Contact with Patient 11/22/16 1018      Chief Complaint  Patient presents with  . Abdominal Pain   Sally Garner is 31 y.o. G2P1001 at [redacted]w[redacted]d who presents with LLQ pain. She has documented IUP on [place] at her last visit here. She denies any vaginal bleeding or vaginal discahgre.    Abdominal Pain  This is a new problem. The current episode started in the past 7 days. The onset quality is gradual. The problem occurs intermittently. The problem has been unchanged. The pain is located in the LLQ. The pain is at a severity of 3/10. The pain is mild. The quality of the pain is colicky and sharp. The abdominal pain does not radiate. Pertinent negatives include no dysuria, fever, frequency, nausea or vomiting. Nothing aggravates the pain. The pain is relieved by nothing.    Past Medical History:  Diagnosis Date  . Hypertension    was put on medication end of 03/2015; states has caused headache and is not taking as prescribed  . Migraines   . Symptomatic cholelithiasis 04/2015    Past Surgical History:  Procedure Laterality Date  . CESAREAN SECTION N/A 05/25/2013   Procedure: CESAREAN SECTION;  Surgeon: Annalee Genta, DO;  Location: [facility];  Service: Obstetrics;  Laterality: N/A;  . CHOLECYSTECTOMY N/A 05/05/2015   Procedure: LAPAROSCOPIC CHOLECYSTECTOMY;  Surgeon: Coralie Keens, MD;  Location: [facility];  Service: General;  Laterality: N/A;    Family History  Problem Relation Age of Onset  . Hypertension Maternal Aunt   . Hypertension Maternal Uncle   . Birth defects Brother        polydactyl  . Cancer Paternal Aunt        breast cancer    Social History  Substance Use Topics  . Smoking status: Current Some Day Smoker    Packs/day: 0.50    Years: 3.00    Types: Cigarettes  . Smokeless tobacco: Never Used  . Alcohol use Yes     Comment: occasional    Allergies:  No Known Allergies  No prescriptions prior to admission.    Review of Systems  Constitutional: Negative for chills and fever.  Gastrointestinal: Positive for abdominal pain. Negative for nausea and vomiting.  Genitourinary: Negative for dysuria, frequency, urgency, vaginal bleeding and vaginal discharge.   Physical Exam   Blood pressure 125/71, pulse 86, temperature 98.3 F (36.8 C), temperature source Oral, resp. rate 17, height 5\' 5"  (1.651 m), weight 231 lb (104.8 kg), last menstrual period 10/13/2016, SpO2 100 %.  Physical Exam  Nursing note and vitals reviewed. Constitutional: She is oriented to person, place, and time. She appears well-developed and well-nourished. No distress.  HENT:  Head: Normocephalic.  Cardiovascular: Normal rate.   Respiratory: Effort normal.  GI: Soft. There is no tenderness. There is no rebound.  Neurological: She is alert and oriented to person, place, and time.  Skin: Skin is warm and dry.  Psychiatric: She has a normal mood and affect.    MAU Course  Procedures  MDM Patient has had simethicone. She reports that it has helped with her pain.   Assessment and Plan   1. Constipation, unspecified constipation type   2. Gas pain   3. [redacted] weeks gestation of pregnancy   4. Intrauterine pregnancy    DC home Comfort measures reviewed  1st Trimester precautions  RX: OTC med  list given.  Return to MAU as needed FU with OB as planned  [place] for [facility] [place] Follow up.   Specialty:  Obstetrics and Gynecology Contact information: [place] 11/22/2016, 11:59 AM

## 2016-11-22 NOTE — MAU Note (Signed)
Pt reports sharp pains in her lower abd since last pm, pain comes and goes. Denies bleeding.

## 2016-11-22 NOTE — Discharge Instructions (Signed)

## 2016-12-29 ENCOUNTER — Encounter: Payer: Self-pay | Admitting: Certified Nurse Midwife

## 2016-12-29 ENCOUNTER — Encounter: Payer: Self-pay | Admitting: *Deleted

## 2016-12-29 ENCOUNTER — Other Ambulatory Visit: Payer: Self-pay | Admitting: Certified Nurse Midwife

## 2016-12-29 ENCOUNTER — Other Ambulatory Visit (HOSPITAL_COMMUNITY)
Admission: RE | Admit: 2016-12-29 | Discharge: 2016-12-29 | Disposition: A | Discharge status: Home / Self Care | Payer: BLUE CROSS/BLUE SHIELD | Source: Ambulatory Visit | Attending: Certified Nurse Midwife | Admitting: Certified Nurse Midwife

## 2016-12-29 ENCOUNTER — Ambulatory Visit (INDEPENDENT_AMBULATORY_CARE_PROVIDER_SITE_OTHER): Payer: BLUE CROSS/BLUE SHIELD | Admitting: Certified Nurse Midwife

## 2016-12-29 VITALS — BP 128/89 | HR 97 | Wt 231.0 lb

## 2016-12-29 DIAGNOSIS — O0990 Supervision of high risk pregnancy, unspecified, unspecified trimester: Secondary | ICD-10-CM | POA: Insufficient documentation

## 2016-12-29 DIAGNOSIS — O0991 Supervision of high risk pregnancy, unspecified, first trimester: Secondary | ICD-10-CM

## 2016-12-29 DIAGNOSIS — Z3A11 11 weeks gestation of pregnancy: Secondary | ICD-10-CM | POA: Diagnosis not present

## 2016-12-29 DIAGNOSIS — O99211 Obesity complicating pregnancy, first trimester: Secondary | ICD-10-CM

## 2016-12-29 DIAGNOSIS — Z113 Encounter for screening for infections with a predominantly sexual mode of transmission: Secondary | ICD-10-CM

## 2016-12-29 DIAGNOSIS — D252 Subserosal leiomyoma of uterus: Secondary | ICD-10-CM

## 2016-12-29 DIAGNOSIS — D251 Intramural leiomyoma of uterus: Secondary | ICD-10-CM

## 2016-12-29 DIAGNOSIS — O219 Vomiting of pregnancy, unspecified: Secondary | ICD-10-CM

## 2016-12-29 DIAGNOSIS — Z124 Encounter for screening for malignant neoplasm of cervix: Secondary | ICD-10-CM

## 2016-12-29 DIAGNOSIS — O99210 Obesity complicating pregnancy, unspecified trimester: Secondary | ICD-10-CM

## 2016-12-29 DIAGNOSIS — E669 Obesity, unspecified: Secondary | ICD-10-CM

## 2016-12-29 DIAGNOSIS — Z8679 Personal history of other diseases of the circulatory system: Secondary | ICD-10-CM

## 2016-12-29 DIAGNOSIS — Z720 Tobacco use: Secondary | ICD-10-CM

## 2016-12-29 DIAGNOSIS — Z98891 History of uterine scar from previous surgery: Secondary | ICD-10-CM

## 2016-12-29 DIAGNOSIS — D250 Submucous leiomyoma of uterus: Secondary | ICD-10-CM

## 2016-12-29 MED ORDER — DOXYLAMINE-PYRIDOXINE 10-10 MG PO TBEC: DELAYED_RELEASE_TABLET | ORAL | 4 refills | Status: DC

## 2016-12-29 MED ORDER — ASPIRIN 81 MG PO CHEW: 81.0000 mg | CHEWABLE_TABLET | Freq: Every day | ORAL | 12 refills | Status: DC

## 2016-12-29 NOTE — Progress Notes (Signed)
Patient is in the office for initial ob visit, no complaints. This is a planned pregnancy.

## 2016-12-29 NOTE — Progress Notes (Signed)
Subjective:    Sally Garner is being seen today for her first obstetrical visit.  This is a planned pregnancy. She is at 61w0dgestation. Her obstetrical history is significant for obesity, smoker and hx of HTN, previous C-section LTCS for failure to progress.  Patient desires TOLAC..Marland KitchenRelationship with FOB: significant other, living together. Patient does intend to breast feed. Pregnancy history fully reviewed.  The information documented in the HPI was reviewed and verified.  Menstrual History: OB History    Gravida Para Term Preterm AB Living   2 1 1     1    SAB TAB Ectopic Multiple Live Births           1      Patient's last menstrual period was 10/13/2016.    Past Medical History:  Diagnosis Date  . Hypertension    was put on medication end of 03/2015; states has caused headache and is not taking as prescribed  . Migraines   . Symptomatic cholelithiasis 04/2015    History reviewed. No pertinent surgical history.   (Not in a hospital admission) No Known Allergies  Social History   Tobacco Use  . Smoking status: Current Some Day Smoker    Packs/day: 0.50    Years: 3.00    Pack years: 1.50    Types: Cigarettes  . Smokeless tobacco: Never Used  Substance Use Topics  . Alcohol use: Yes    Comment: occasional    Family History  Problem Relation Age of Onset  . Hypertension Maternal Aunt   . Hypertension Maternal Uncle   . Birth defects Brother        polydactyl  . Cancer Paternal Aunt        breast cancer     Review of Systems Constitutional: negative for weight loss Gastrointestinal: negative for vomiting, + nausea Genitourinary:negative for genital lesions and vaginal discharge and dysuria Musculoskeletal:negative for back pain Behavioral/Psych: negative for abusive relationship, depression, illegal drug usage and tobacco use    Objective:    BP 128/89   Pulse 97   Wt 231 lb (104.8 kg)   LMP 10/13/2016   BMI 38.44 kg/m  General Appearance:    Alert,  cooperative, no distress, appears stated age  Head:    Normocephalic, without obvious abnormality, atraumatic  Eyes:    PERRL, conjunctiva/corneas clear, EOM's intact, fundi    benign, both eyes  Ears:    Normal TM's and external ear canals, both ears  Nose:   Nares normal, septum midline, mucosa normal, no drainage    or sinus tenderness  Throat:   Lips, mucosa, and tongue normal; teeth and gums normal  Neck:   Supple, symmetrical, trachea midline, no adenopathy;    thyroid:  no enlargement/tenderness/nodules; no carotid   bruit or JVD  Back:     Symmetric, no curvature, ROM normal, no CVA tenderness  Lungs:     Clear to auscultation bilaterally, respirations unlabored  Chest Wall:    No tenderness or deformity   Heart:    Regular rate and rhythm, S1 and S2 normal, no murmur, rub   or gallop  Breast Exam:    No tenderness, masses, or nipple abnormality  Abdomen:     Soft, non-tender, bowel sounds active all four quadrants,    no masses, no organomegaly  Genitalia:    Normal female without lesion, discharge or tenderness  Extremities:   Extremities normal, atraumatic, no cyanosis or edema  Pulses:   2+ and symmetric all extremities  Skin:   Skin color, texture, turgor normal, no rashes or lesions  Lymph nodes:   Cervical, supraclavicular, and axillary nodes normal  Neurologic:   CNII-XII intact, normal strength, sensation and reflexes    throughout                Cervix: long, thick, closed and posterior.  FHR: 155 on [place].  Size c/w early [place].     Lab Review Urine pregnancy test Labs reviewed yes Radiologic studies reviewed yes  Assessment & Plan    Pregnancy at 17w0dweeks    1. Supervision of high risk pregnancy, antepartum    - Hemoglobinopathy evaluation - Varicella zoster antibody, IgG - Hemoglobin A1c - Obstetric Panel, Including HIV - Cystic Fibrosis Mutation 97 - [place] OB DET[place] +14 WK; Future - Culture, OB Urine - Cervicovaginal ancillary only - Cytology -  PAP  2. Intramural, submucous, and subserous leiomyoma of uterus    - [facility] OB DETAIL +14 WK; Future  3. Hx of primary hypertension--2-3 years ago    - aspirin 81 MG chewable tablet; Chew 1 tablet (81 mg total) daily by mouth. (Patient not taking: Reported on 12/29/2016)  Dispense: 30 tablet; Refill: 12 - [facility] OB [facility] +14 WK; Future - Comp Met (CMET) - Protein / creatinine ratio, urine  4. Status post primary low transverse cesarean section--FTP, NRFHR      5. Tobacco abuse    - [facility] OB DETAIL +14 WK; Future  6. Obesity complicating peripregnancy, antepartum      7. Nausea and vomiting during pregnancy    - Doxylamine-Pyridoxine (DICLEGIS) 10-10 MG TBEC; Take 1 tablet with breakfast and lunch.  Take 2 tablets at bedtime.  Dispense: 100 tablet; Refill: 4     Prenatal vitamins.  Counseling provided regarding continued use of seat belts, cessation of alcohol consumption, smoking or use of illicit drugs; infection precautions i.e., influenza/TDAP immunizations, toxoplasmosis,CMV, parvovirus, listeria and varicella; workplace safety, exercise during pregnancy; routine dental care, safe medications, sexual activity, hot tubs, saunas, pools, travel, caffeine use, fish and methlymercury, potential toxins, hair treatments, varicose veins Weight gain recommendations per IOM guidelines reviewed: underweight/BMI< 18.5--> gain 28 - 40 lbs; normal weight/BMI 18.5 - 24.9--> gain 25 - 35 lbs; overweight/BMI 25 - 29.9--> gain 15 - 25 lbs; obese/BMI >30->gain  11 - 20 lbs Problem list reviewed and updated. FIRST/CF mutation testing/NIPT/QUAD SCREEN/fragile X/Ashkenazi Jewish population testing/Spinal muscular atrophy discussed: requested. Role of ultrasound in pregnancy discussed; fetal survey: ordered. Amniocentesis discussed: not indicated. VBAC calculator score: VBAC consent form provided Meds ordered this encounter  Medications  . aspirin 81 MG chewable tablet    Sig: Chew 1 tablet  (81 mg total) daily by mouth.    Dispense:  30 tablet    Refill:  12  . Prenatal MV-Min-FA-Omega-3 (PRENATAL GUMMIES/DHA & FA) 0.4-32.5 MG CHEW    Sig: Chew by mouth.  . Doxylamine-Pyridoxine (DICLEGIS) 10-10 MG TBEC    Sig: Take 1 tablet with breakfast and lunch.  Take 2 tablets at bedtime.    Dispense:  100 tablet    Refill:  4   Orders Placed This Encounter  Procedures  . Culture, OB Urine  . [facility] OB DETAIL +14 WK    Standing Status:   Future    Standing Expiration Date:   02/28/2018    Order Specific Question:   Reason for Exam (SYMPTOM  OR DIAGNOSIS REQUIRED)    Answer:   fetal anatomy scan, fibroids,  hx of HTN/smoker    Order Specific Question:   Preferred Imaging Location?    Answer:   [facility]-Ultrasound  . [place] MFM Fetal Nuchal Translucency    Standing Status:   Future    Standing Expiration Date:   02/28/2018    Order Specific Question:   Reason for Exam (SYMPTOM  OR DIAGNOSIS REQUIRED)    Answer:   HTN, obesity    Order Specific Question:   Preferred Imaging Location?    Answer:   [facility]Ultrasound  . Hemoglobinopathy evaluation  . Varicella zoster antibody, IgG  . Hemoglobin A1c  . Obstetric Panel, Including HIV  . Cystic Fibrosis Mutation 97  . Comp Met (CMET)  . Protein / creatinine ratio, urine  . [facility] MFM GENETICS REFERRAL    Referral Priority:   Routine    Referral Type:   Consultation    Referral Reason:   Specialty Services Required    Number of Visits Requested:   1    Follow up in 4 weeks. 50% of 45 min visit spent on counseling and coordination of care.

## 2016-12-30 LAB — CERVICOVAGINAL ANCILLARY ONLY
Bacterial vaginitis: POSITIVE — AB
Candida vaginitis: NEGATIVE
Chlamydia: NEGATIVE
Neisseria Gonorrhea: NEGATIVE
Trichomonas: NEGATIVE

## 2016-12-30 LAB — CYTOLOGY - PAP
DIAGNOSIS: NEGATIVE
HPV (WINDOPATH): NOT DETECTED

## 2016-12-30 LAB — PROTEIN / CREATININE RATIO, URINE
CREATININE, UR: 248.9 mg/dL
PROTEIN UR: 20.7 mg/dL
PROTEIN/CREAT RATIO: 83 mg/g{creat} (ref 0–200)

## 2016-12-31 LAB — CULTURE, OB URINE

## 2016-12-31 LAB — URINE CULTURE, OB REFLEX

## 2017-01-02 ENCOUNTER — Other Ambulatory Visit: Payer: Self-pay | Admitting: Certified Nurse Midwife

## 2017-01-02 ENCOUNTER — Encounter: Payer: Self-pay | Admitting: Obstetrics and Gynecology

## 2017-01-02 DIAGNOSIS — O0990 Supervision of high risk pregnancy, unspecified, unspecified trimester: Secondary | ICD-10-CM

## 2017-01-02 DIAGNOSIS — O10919 Unspecified pre-existing hypertension complicating pregnancy, unspecified trimester: Secondary | ICD-10-CM | POA: Insufficient documentation

## 2017-01-02 DIAGNOSIS — N760 Acute vaginitis: Principal | ICD-10-CM

## 2017-01-02 DIAGNOSIS — B9689 Other specified bacterial agents as the cause of diseases classified elsewhere: Secondary | ICD-10-CM

## 2017-01-02 LAB — OBSTETRIC PANEL, INCLUDING HIV
Antibody Screen: NEGATIVE
BASOS ABS: 0 10*3/uL (ref 0.0–0.2)
Basos: 0 %
EOS (ABSOLUTE): 0.2 10*3/uL (ref 0.0–0.4)
Eos: 2 %
HEP B S AG: NEGATIVE
HIV SCREEN 4TH GENERATION: NONREACTIVE
Hematocrit: 35.3 % (ref 34.0–46.6)
Hemoglobin: 11.6 g/dL (ref 11.1–15.9)
IMMATURE GRANULOCYTES: 0 %
Immature Grans (Abs): 0 10*3/uL (ref 0.0–0.1)
LYMPHS ABS: 2.9 10*3/uL (ref 0.7–3.1)
Lymphs: 40 %
MCH: 29.7 pg (ref 26.6–33.0)
MCHC: 32.9 g/dL (ref 31.5–35.7)
MCV: 90 fL (ref 79–97)
MONOS ABS: 0.5 10*3/uL (ref 0.1–0.9)
Monocytes: 7 %
NEUTROS PCT: 51 %
Neutrophils Absolute: 3.6 10*3/uL (ref 1.4–7.0)
PLATELETS: 315 10*3/uL (ref 150–379)
RBC: 3.91 x10E6/uL (ref 3.77–5.28)
RDW: 13.5 % (ref 12.3–15.4)
RPR: NONREACTIVE
Rh Factor: POSITIVE
Rubella Antibodies, IGG: 3.99 index (ref >0.99)
WBC: 7.3 10*3/uL (ref 3.4–10.8)

## 2017-01-02 LAB — HEMOGLOBIN A1C
Est. average glucose Bld gHb Est-mCnc: 126 mg/dL
Hgb A1c MFr Bld: 6 % — ABNORMAL HIGH (ref 4.8–5.6)

## 2017-01-02 LAB — COMPREHENSIVE METABOLIC PANEL
ALK PHOS: 54 IU/L (ref 39–117)
ALT: 13 IU/L (ref 0–32)
AST: 17 IU/L (ref 0–40)
Albumin/Globulin Ratio: 1.5 (ref 1.2–2.2)
Albumin: 4.3 g/dL (ref 3.5–5.5)
BUN/Creatinine Ratio: 7 — ABNORMAL LOW (ref 9–23)
BUN: 4 mg/dL — ABNORMAL LOW (ref 6–20)
Bilirubin Total: <0.2 mg/dL (ref 0.0–1.2)
CALCIUM: 9.6 mg/dL (ref 8.7–10.2)
CO2: 22 mmol/L (ref 20–29)
CREATININE: 0.58 mg/dL (ref 0.57–1.00)
Chloride: 100 mmol/L (ref 96–106)
GFR calc Af Amer: 142 mL/min/{1.73_m2} (ref >59)
GFR, EST NON AFRICAN AMERICAN: 123 mL/min/{1.73_m2} (ref >59)
Globulin, Total: 2.9 g/dL (ref 1.5–4.5)
Glucose: 75 mg/dL (ref 65–99)
POTASSIUM: 4 mmol/L (ref 3.5–5.2)
Sodium: 138 mmol/L (ref 134–144)
Total Protein: 7.2 g/dL (ref 6.0–8.5)

## 2017-01-02 LAB — HEMOGLOBINOPATHY EVALUATION
HGB C: 0 %
HGB S: 0 %
HGB VARIANT: 0 %
Hemoglobin A2 Quantitation: 2.2 % (ref 1.8–3.2)
Hemoglobin F Quantitation: 1.4 % (ref 0.0–2.0)
Hgb A: 96.4 % (ref 96.4–98.8)

## 2017-01-02 LAB — VARICELLA ZOSTER ANTIBODY, IGG

## 2017-01-02 MED ORDER — SECNIDAZOLE 2 G PO PACK: 1.0000 | PACK | Freq: Once | ORAL | 0 refills | Status: AC

## 2017-01-03 ENCOUNTER — Telehealth: Payer: Self-pay | Admitting: *Deleted

## 2017-01-03 ENCOUNTER — Other Ambulatory Visit: Payer: Self-pay | Admitting: Certified Nurse Midwife

## 2017-01-03 DIAGNOSIS — R7309 Other abnormal glucose: Secondary | ICD-10-CM | POA: Insufficient documentation

## 2017-01-03 DIAGNOSIS — O0990 Supervision of high risk pregnancy, unspecified, unspecified trimester: Secondary | ICD-10-CM

## 2017-01-03 NOTE — Telephone Encounter (Signed)
error 

## 2017-01-05 ENCOUNTER — Other Ambulatory Visit: Payer: BLUE CROSS/BLUE SHIELD

## 2017-01-05 ENCOUNTER — Telehealth: Payer: Self-pay

## 2017-01-05 NOTE — Telephone Encounter (Signed)
Contacted patient and advised of PA approval for medications.

## 2017-01-07 LAB — CYSTIC FIBROSIS MUTATION 97: GENE DIS ANAL CARRIER INTERP BLD/T-IMP: NOT DETECTED

## 2017-01-09 ENCOUNTER — Other Ambulatory Visit: Payer: Self-pay | Admitting: Certified Nurse Midwife

## 2017-01-09 DIAGNOSIS — O0990 Supervision of high risk pregnancy, unspecified, unspecified trimester: Secondary | ICD-10-CM

## 2017-01-10 ENCOUNTER — Encounter (HOSPITAL_COMMUNITY): Payer: Self-pay | Admitting: Certified Nurse Midwife

## 2017-01-16 ENCOUNTER — Ambulatory Visit (HOSPITAL_COMMUNITY): Payer: BLUE CROSS/BLUE SHIELD

## 2017-01-18 ENCOUNTER — Ambulatory Visit (HOSPITAL_COMMUNITY)
Admission: RE | Admit: 2017-01-18 | Discharge: 2017-01-18 | Disposition: A | Discharge status: Home / Self Care | Payer: BLUE CROSS/BLUE SHIELD | Source: Ambulatory Visit | Attending: Certified Nurse Midwife | Admitting: Certified Nurse Midwife

## 2017-01-18 ENCOUNTER — Encounter (HOSPITAL_COMMUNITY): Payer: Self-pay

## 2017-01-18 ENCOUNTER — Other Ambulatory Visit: Payer: Self-pay | Admitting: Certified Nurse Midwife

## 2017-01-18 DIAGNOSIS — O99211 Obesity complicating pregnancy, first trimester: Secondary | ICD-10-CM

## 2017-01-18 DIAGNOSIS — Z8679 Personal history of other diseases of the circulatory system: Secondary | ICD-10-CM

## 2017-01-18 DIAGNOSIS — Z3682 Encounter for antenatal screening for nuchal translucency: Secondary | ICD-10-CM | POA: Insufficient documentation

## 2017-01-18 DIAGNOSIS — Z3A13 13 weeks gestation of pregnancy: Secondary | ICD-10-CM | POA: Insufficient documentation

## 2017-01-21 ENCOUNTER — Other Ambulatory Visit: Payer: Self-pay | Admitting: Certified Nurse Midwife

## 2017-01-21 DIAGNOSIS — O0990 Supervision of high risk pregnancy, unspecified, unspecified trimester: Secondary | ICD-10-CM

## 2017-01-26 ENCOUNTER — Ambulatory Visit (INDEPENDENT_AMBULATORY_CARE_PROVIDER_SITE_OTHER): Payer: BLUE CROSS/BLUE SHIELD | Admitting: Obstetrics and Gynecology

## 2017-01-26 ENCOUNTER — Encounter: Payer: Self-pay | Admitting: Obstetrics and Gynecology

## 2017-01-26 ENCOUNTER — Other Ambulatory Visit: Payer: BLUE CROSS/BLUE SHIELD

## 2017-01-26 VITALS — BP 127/77 | Wt 235.0 lb

## 2017-01-26 DIAGNOSIS — O0992 Supervision of high risk pregnancy, unspecified, second trimester: Secondary | ICD-10-CM

## 2017-01-26 DIAGNOSIS — D252 Subserosal leiomyoma of uterus: Secondary | ICD-10-CM

## 2017-01-26 DIAGNOSIS — O0990 Supervision of high risk pregnancy, unspecified, unspecified trimester: Secondary | ICD-10-CM

## 2017-01-26 DIAGNOSIS — O10912 Unspecified pre-existing hypertension complicating pregnancy, second trimester: Secondary | ICD-10-CM

## 2017-01-26 DIAGNOSIS — Z98891 History of uterine scar from previous surgery: Secondary | ICD-10-CM

## 2017-01-26 DIAGNOSIS — O10919 Unspecified pre-existing hypertension complicating pregnancy, unspecified trimester: Secondary | ICD-10-CM

## 2017-01-26 DIAGNOSIS — D251 Intramural leiomyoma of uterus: Secondary | ICD-10-CM

## 2017-01-26 DIAGNOSIS — D250 Submucous leiomyoma of uterus: Secondary | ICD-10-CM

## 2017-01-26 DIAGNOSIS — R7309 Other abnormal glucose: Secondary | ICD-10-CM

## 2017-01-26 NOTE — Patient Instructions (Addendum)
Take your blood pressure using the cuff at home every other day or so. Please call the office if the top number is > 140 or if the bottom number is > 90.   Hypertension During Pregnancy Hypertension is also called high blood pressure. High blood pressure means that the force of your blood moving in your body is too strong. When you are pregnant, this condition should be watched carefully. It can cause problems for you and your baby. Follow these instructions at home: Eating and drinking  Drink enough fluid to keep your pee (urine) clear or pale yellow.  Eat healthy foods that are low in salt (sodium). ? Do not add salt to your food. ? Check labels on foods and drinks to see much salt is in them. Look on the label where you see "Sodium." Lifestyle  Do not use any products that contain nicotine or tobacco, such as cigarettes and e-cigarettes. If you need help quitting, ask your doctor.  Do not use alcohol.  Avoid caffeine.  Avoid stress. Rest and get plenty of sleep. General instructions  Take over-the-counter and prescription medicines only as told by your doctor.  While lying down, lie on your left side. This keeps pressure off your baby.  While sitting or lying down, raise (elevate) your feet. Try putting some pillows under your lower legs.  Exercise regularly. Ask your doctor what kinds of exercise are best for you.  Keep all prenatal and follow-up visits as told by your doctor. This is important. Contact a doctor if:  You have symptoms that your doctor told you to watch for, such as: ? Fever. ? Throwing up (vomiting). ? Headache. Get help right away if:  You have very bad pain in your belly (abdomen).  You are throwing up, and this does not get better with treatment.  You suddenly get swelling in your hands, ankles, or face.  You gain 4 lb (1.8 kg) or more in 1 week.  You get bleeding from your vagina.  You have blood in your pee.  You do not feel your baby  moving as much as normal.  You have a change in vision.  You have muscle twitching or sudden tightening (spasms).  You have trouble breathing.  Your lips or fingernails turn blue. This information is not intended to replace advice given to you by your health care provider. Make sure you discuss any questions you have with your health care provider. Document Released: 03/12/2010 Document Revised: 10/20/2015 Document Reviewed: 10/20/2015 Elsevier Interactive Patient Education  2017 [employer].

## 2017-01-26 NOTE — Progress Notes (Signed)
   PRENATAL VISIT NOTE  Subjective:  Sally Garner is a 31 y.o. G2P1001 at [redacted]w[redacted]d being seen today for ongoing prenatal care.  She is currently monitored for the following issues for this high-risk pregnancy and has Anemia; Smoker; Fibroid uterus; Obesity, unspecified--BMI 37.1; Hx of primary hypertension--2-3 years ago; Status post primary low transverse cesarean section--FTP, NRFHR; Pleuritic chest pain; Supervision of high risk pregnancy, antepartum; Chronic hypertension during pregnancy, antepartum; and Elevated hemoglobin A1c on their problem list.  Patient reports no complaints, denies nausea, she is feeling very well..  Contractions: Not present. Vag. Bleeding: None.  Movement: Present. Denies leaking of fluid.   The following portions of the patient's history were reviewed and updated as appropriate: allergies, current medications, past family history, past medical history, past social history, past surgical history and problem list. Problem list updated.  Objective:   Vitals:   01/26/17 0844  BP: 127/77  Weight: 235 lb (106.6 kg)    Fetal Status: Fetal Heart Rate (bpm): 158   Movement: Present     General:  Alert, oriented and cooperative. Patient is in no acute distress.  Skin: Skin is warm and dry. No rash noted.   Cardiovascular: Normal heart rate noted  Respiratory: Normal respiratory effort, no problems with respiration noted  Abdomen: Soft, gravid, appropriate for gestational age.  Pain/Pressure: Absent     Pelvic: Cervical exam deferred        Extremities: Normal range of motion.  Edema: None  Mental Status:  Normal mood and affect. Normal behavior. Normal judgment and thought content.   Assessment and Plan:  Pregnancy: G2P1001 at [redacted]w[redacted]d  1. Supervision of high risk pregnancy, antepartum anatomy scheduled for 02/28/17  2. Chronic hypertension during pregnancy, antepartum Not taking bASA, counseled again to start To start measuring BP at home with cuff  3.  Intramural, submucous, and subserous leiomyoma of uterus  4. Status post primary low transverse cesarean section--FTP, NRFHR  5. Elevated hemoglobin A1c 2 hr GTT today  Preterm labor symptoms and general obstetric precautions including but not limited to vaginal bleeding, contractions, leaking of fluid and fetal movement were reviewed in detail with the patient. Please refer to After Visit Summary for other counseling recommendations.  Return in about 4 weeks (around 02/23/2017) for OB visit.   Sloan Leiter, MD

## 2017-01-26 NOTE — Progress Notes (Signed)
Patient is in the office for ob visit, denies pain, states has not felt fetal movement as of yet.

## 2017-01-26 NOTE — Addendum Note (Signed)
Addended by: Tristan Schroeder D on: 01/26/2017 11:42 AM   Modules accepted: Orders

## 2017-01-27 LAB — GLUCOSE TOLERANCE, 2 HOURS W/ 1HR
GLUCOSE, 1 HOUR: 124 mg/dL (ref 65–179)
Glucose, 2 hour: 122 mg/dL (ref 65–152)
Glucose, Fasting: 89 mg/dL (ref 65–91)

## 2017-02-02 ENCOUNTER — Other Ambulatory Visit (HOSPITAL_COMMUNITY): Payer: Self-pay

## 2017-02-15 ENCOUNTER — Encounter: Payer: Self-pay | Admitting: *Deleted

## 2017-02-23 ENCOUNTER — Ambulatory Visit (INDEPENDENT_AMBULATORY_CARE_PROVIDER_SITE_OTHER): Payer: BLUE CROSS/BLUE SHIELD | Admitting: Obstetrics and Gynecology

## 2017-02-23 VITALS — BP 129/86 | HR 118 | Wt 237.0 lb

## 2017-02-23 DIAGNOSIS — O0990 Supervision of high risk pregnancy, unspecified, unspecified trimester: Secondary | ICD-10-CM

## 2017-02-23 DIAGNOSIS — Z98891 History of uterine scar from previous surgery: Secondary | ICD-10-CM

## 2017-02-23 DIAGNOSIS — O0992 Supervision of high risk pregnancy, unspecified, second trimester: Secondary | ICD-10-CM

## 2017-02-23 DIAGNOSIS — O10912 Unspecified pre-existing hypertension complicating pregnancy, second trimester: Secondary | ICD-10-CM

## 2017-02-23 DIAGNOSIS — O10919 Unspecified pre-existing hypertension complicating pregnancy, unspecified trimester: Secondary | ICD-10-CM

## 2017-02-23 DIAGNOSIS — R7309 Other abnormal glucose: Secondary | ICD-10-CM

## 2017-02-23 MED ORDER — ASPIRIN EC 81 MG PO TBEC: 81.0000 mg | DELAYED_RELEASE_TABLET | Freq: Every day | ORAL | 2 refills | Status: DC

## 2017-02-23 NOTE — Progress Notes (Signed)
   PRENATAL VISIT NOTE  Subjective:  Sally Garner is a 32 y.o. G2P1001 at [redacted]w[redacted]d being seen today for ongoing prenatal care.  She is currently monitored for the following issues for this high-risk pregnancy and has Anemia; Smoker; Fibroid uterus; Obesity, unspecified--BMI 37.1; Hx of primary hypertension--2-3 years ago; Status post primary low transverse cesarean section--FTP, NRFHR; Pleuritic chest pain; Supervision of high risk pregnancy, antepartum; Chronic hypertension during pregnancy, antepartum; and Elevated hemoglobin A1c on their problem list.  Patient reports headache.  Contractions: Not present. Vag. Bleeding: None.  Movement: Present. Denies leaking of fluid.   The following portions of the patient's history were reviewed and updated as appropriate: allergies, current medications, past family history, past medical history, past social history, past surgical history and problem list. Problem list updated.  Objective:   Vitals:   02/23/17 0902  BP: 129/86  Pulse: (!) 118  Weight: 237 lb (107.5 kg)    Fetal Status: Fetal Heart Rate (bpm): 153   Movement: Present     General:  Alert, oriented and cooperative. Patient is in no acute distress.  Skin: Skin is warm and dry. No rash noted.   Cardiovascular: Normal heart rate noted  Respiratory: Normal respiratory effort, no problems with respiration noted  Abdomen: Soft, gravid, appropriate for gestational age.  Pain/Pressure: Absent     Pelvic: Cervical exam deferred        Extremities: Normal range of motion.  Edema: None  Mental Status:  Normal mood and affect. Normal behavior. Normal judgment and thought content.   Assessment and Plan:  Pregnancy: G2P1001 at [redacted]w[redacted]d  1. Supervision of high risk pregnancy, antepartum Quad screen today Anatomy scan scheduled for 03/10/17  2. Chronic hypertension during pregnancy, antepartum Pt has not been taking baby ASA Reviewed importance of taking baby aspirin  3. Status post primary  low transverse cesarean section--FTP, NRFHR Needs to sign TOLAC papers  4. Elevated hemoglobin A1c Normal 2 hr 12/9  Preterm labor symptoms and general obstetric precautions including but not limited to vaginal bleeding, contractions, leaking of fluid and fetal movement were reviewed in detail with the patient. Please refer to After Visit Summary for other counseling recommendations.  Return in about 4 weeks (around 03/23/2017) for OB visit.   Sloan Leiter, MD

## 2017-02-23 NOTE — Progress Notes (Signed)
Pt c/o eczema exacerbation

## 2017-02-25 LAB — AFP TETRA
DIA MOM VALUE: 1.44
DIA VALUE (EIA): 199.67 pg/mL
DSR (BY AGE) 1 IN: 518
DSR (Second Trimester) 1 IN: 536
Gestational Age: 19 WEEKS
MSAFP Mom: 0.94
MSAFP: 38.2 ng/mL
MSHCG MOM: 1.09
MSHCG: 20218 m[IU]/mL
Maternal Age At EDD: 32.1 yr
Osb Risk: 10000
Test Results:: NEGATIVE
UE3 VALUE: 0.93 ng/mL
Weight: 237 [lb_av]
uE3 Mom: 0.66

## 2017-02-28 ENCOUNTER — Ambulatory Visit (HOSPITAL_COMMUNITY)
Admission: RE | Admit: 2017-02-28 | Discharge: 2017-02-28 | Disposition: A | Discharge status: Home / Self Care | Payer: Medicaid Other | Source: Ambulatory Visit | Attending: Certified Nurse Midwife | Admitting: Certified Nurse Midwife

## 2017-02-28 ENCOUNTER — Encounter (HOSPITAL_COMMUNITY): Payer: Self-pay

## 2017-02-28 ENCOUNTER — Other Ambulatory Visit: Payer: Self-pay | Admitting: Certified Nurse Midwife

## 2017-02-28 ENCOUNTER — Other Ambulatory Visit (HOSPITAL_COMMUNITY): Payer: Self-pay | Admitting: *Deleted

## 2017-02-28 ENCOUNTER — Ambulatory Visit (HOSPITAL_COMMUNITY): Payer: BLUE CROSS/BLUE SHIELD

## 2017-02-28 DIAGNOSIS — Z8679 Personal history of other diseases of the circulatory system: Secondary | ICD-10-CM

## 2017-02-28 DIAGNOSIS — D252 Subserosal leiomyoma of uterus: Principal | ICD-10-CM

## 2017-02-28 DIAGNOSIS — Z363 Encounter for antenatal screening for malformations: Secondary | ICD-10-CM | POA: Insufficient documentation

## 2017-02-28 DIAGNOSIS — O99212 Obesity complicating pregnancy, second trimester: Secondary | ICD-10-CM

## 2017-02-28 DIAGNOSIS — D250 Submucous leiomyoma of uterus: Secondary | ICD-10-CM

## 2017-02-28 DIAGNOSIS — O09299 Supervision of pregnancy with other poor reproductive or obstetric history, unspecified trimester: Secondary | ICD-10-CM | POA: Diagnosis present

## 2017-02-28 DIAGNOSIS — O0990 Supervision of high risk pregnancy, unspecified, unspecified trimester: Secondary | ICD-10-CM

## 2017-02-28 DIAGNOSIS — Z720 Tobacco use: Secondary | ICD-10-CM

## 2017-02-28 DIAGNOSIS — Z3A19 19 weeks gestation of pregnancy: Secondary | ICD-10-CM

## 2017-02-28 DIAGNOSIS — D251 Intramural leiomyoma of uterus: Principal | ICD-10-CM

## 2017-02-28 DIAGNOSIS — O10919 Unspecified pre-existing hypertension complicating pregnancy, unspecified trimester: Secondary | ICD-10-CM

## 2017-02-28 DIAGNOSIS — O99332 Smoking (tobacco) complicating pregnancy, second trimester: Secondary | ICD-10-CM | POA: Insufficient documentation

## 2017-03-23 ENCOUNTER — Ambulatory Visit (INDEPENDENT_AMBULATORY_CARE_PROVIDER_SITE_OTHER): Payer: Medicaid Other | Admitting: Obstetrics and Gynecology

## 2017-03-23 ENCOUNTER — Encounter: Payer: Self-pay | Admitting: Obstetrics and Gynecology

## 2017-03-23 VITALS — BP 126/82 | HR 108 | Wt 242.8 lb

## 2017-03-23 DIAGNOSIS — O0992 Supervision of high risk pregnancy, unspecified, second trimester: Secondary | ICD-10-CM

## 2017-03-23 DIAGNOSIS — D252 Subserosal leiomyoma of uterus: Secondary | ICD-10-CM

## 2017-03-23 DIAGNOSIS — O10912 Unspecified pre-existing hypertension complicating pregnancy, second trimester: Secondary | ICD-10-CM

## 2017-03-23 DIAGNOSIS — F17200 Nicotine dependence, unspecified, uncomplicated: Secondary | ICD-10-CM

## 2017-03-23 DIAGNOSIS — O10919 Unspecified pre-existing hypertension complicating pregnancy, unspecified trimester: Secondary | ICD-10-CM

## 2017-03-23 DIAGNOSIS — Z98891 History of uterine scar from previous surgery: Secondary | ICD-10-CM

## 2017-03-23 DIAGNOSIS — Z3009 Encounter for other general counseling and advice on contraception: Secondary | ICD-10-CM | POA: Insufficient documentation

## 2017-03-23 DIAGNOSIS — R7309 Other abnormal glucose: Secondary | ICD-10-CM

## 2017-03-23 DIAGNOSIS — D251 Intramural leiomyoma of uterus: Secondary | ICD-10-CM

## 2017-03-23 DIAGNOSIS — D250 Submucous leiomyoma of uterus: Secondary | ICD-10-CM

## 2017-03-23 DIAGNOSIS — O0990 Supervision of high risk pregnancy, unspecified, unspecified trimester: Secondary | ICD-10-CM

## 2017-03-23 NOTE — Progress Notes (Signed)
   PRENATAL VISIT NOTE  Subjective:  Sally Garner is a 32 y.o. G2P1001 at [redacted]w[redacted]d being seen today for ongoing prenatal care.  She is currently monitored for the following issues for this high-risk pregnancy and has Anemia; Smoker; Fibroid uterus; Obesity, unspecified--BMI 37.1; Hx of primary hypertension--2-3 years ago; Status post primary low transverse cesarean section--FTP, NRFHR; Pleuritic chest pain; Supervision of high risk pregnancy, antepartum; Chronic hypertension during pregnancy, antepartum; Elevated hemoglobin A1c; and Unwanted fertility on their problem list.    Patient reports no complaints.  Contractions: Not present. Vag. Bleeding: None.  Movement: Present. Denies leaking of fluid.   The following portions of the patient's history were reviewed and updated as appropriate: allergies, current medications, past family history, past medical history, past social history, past surgical history and problem list. Problem list updated.  Objective:   Vitals:   03/23/17 0923  BP: 126/82  Pulse: (!) 108  Weight: 242 lb 12.8 oz (110.1 kg)    Fetal Status: Fetal Heart Rate (bpm): 155   Movement: Present     General:  Alert, oriented and cooperative. Patient is in no acute distress.  Skin: Skin is warm and dry. No rash noted.   Cardiovascular: Normal heart rate noted  Respiratory: Normal respiratory effort, no problems with respiration noted  Abdomen: Soft, gravid, appropriate for gestational age.  Pain/Pressure: Absent     Pelvic: Cervical exam deferred        Extremities: Normal range of motion.  Edema: None  Mental Status:  Normal mood and affect. Normal behavior. Normal judgment and thought content.   Assessment and Plan:  Pregnancy: G2P1001 at [redacted]w[redacted]d  1. Chronic hypertension during pregnancy, antepartum Has not been compliant with baby ASA Reviewed importance of taking ASA  2. Intramural, submucous, and subserous leiomyoma of uterus  3. Status post primary low transverse  cesarean section--FTP, NRFHR For TOLAC  4. Supervision of high risk pregnancy, antepartum  5. Elevated hemoglobin A1c Normal 2 hr 12/9  6. Smoker Has cut down but not quit Encouraged cessation  7. Unwanted fertility BTL papers signed 03/23/17   Preterm labor symptoms and general obstetric precautions including but not limited to vaginal bleeding, contractions, leaking of fluid and fetal movement were reviewed in detail with the patient. Please refer to After Visit Summary for other counseling recommendations.  Return in about 2 weeks (around 04/06/2017) for OB visit (MD).   Sloan Leiter, MD

## 2017-03-23 NOTE — Patient Instructions (Signed)

## 2017-04-06 ENCOUNTER — Encounter: Payer: Medicaid Other | Admitting: Obstetrics and Gynecology

## 2017-04-11 ENCOUNTER — Ambulatory Visit (HOSPITAL_COMMUNITY)
Admission: RE | Admit: 2017-04-11 | Discharge: 2017-04-11 | Disposition: A | Discharge status: Home / Self Care | Payer: Medicaid Other | Source: Ambulatory Visit | Attending: Certified Nurse Midwife | Admitting: Certified Nurse Midwife

## 2017-04-11 ENCOUNTER — Encounter (HOSPITAL_COMMUNITY): Payer: Self-pay

## 2017-04-11 ENCOUNTER — Other Ambulatory Visit (HOSPITAL_COMMUNITY): Payer: Self-pay | Admitting: Obstetrics and Gynecology

## 2017-04-11 DIAGNOSIS — Z3A25 25 weeks gestation of pregnancy: Secondary | ICD-10-CM | POA: Diagnosis not present

## 2017-04-11 DIAGNOSIS — O09292 Supervision of pregnancy with other poor reproductive or obstetric history, second trimester: Secondary | ICD-10-CM | POA: Insufficient documentation

## 2017-04-11 DIAGNOSIS — O09299 Supervision of pregnancy with other poor reproductive or obstetric history, unspecified trimester: Secondary | ICD-10-CM

## 2017-04-11 DIAGNOSIS — O99212 Obesity complicating pregnancy, second trimester: Secondary | ICD-10-CM | POA: Diagnosis present

## 2017-04-11 DIAGNOSIS — O34219 Maternal care for unspecified type scar from previous cesarean delivery: Secondary | ICD-10-CM | POA: Diagnosis present

## 2017-04-11 DIAGNOSIS — O99332 Smoking (tobacco) complicating pregnancy, second trimester: Secondary | ICD-10-CM

## 2017-04-11 DIAGNOSIS — Z362 Encounter for other antenatal screening follow-up: Secondary | ICD-10-CM

## 2017-04-11 NOTE — Addendum Note (Signed)
Encounter addended by: Novella Rob, RDMS on: 04/11/2017 10:49 AM  Actions taken: Imaging Exam ended

## 2017-04-12 ENCOUNTER — Ambulatory Visit (INDEPENDENT_AMBULATORY_CARE_PROVIDER_SITE_OTHER): Payer: Medicaid Other | Admitting: Obstetrics and Gynecology

## 2017-04-12 ENCOUNTER — Encounter: Payer: Self-pay | Admitting: Obstetrics and Gynecology

## 2017-04-12 VITALS — BP 118/83 | HR 121 | Wt 243.0 lb

## 2017-04-12 DIAGNOSIS — O34219 Maternal care for unspecified type scar from previous cesarean delivery: Secondary | ICD-10-CM

## 2017-04-12 DIAGNOSIS — O10912 Unspecified pre-existing hypertension complicating pregnancy, second trimester: Secondary | ICD-10-CM

## 2017-04-12 DIAGNOSIS — O10919 Unspecified pre-existing hypertension complicating pregnancy, unspecified trimester: Secondary | ICD-10-CM

## 2017-04-12 DIAGNOSIS — O0990 Supervision of high risk pregnancy, unspecified, unspecified trimester: Secondary | ICD-10-CM

## 2017-04-12 DIAGNOSIS — Z98891 History of uterine scar from previous surgery: Secondary | ICD-10-CM

## 2017-04-12 DIAGNOSIS — Z3009 Encounter for other general counseling and advice on contraception: Secondary | ICD-10-CM

## 2017-04-12 NOTE — Progress Notes (Signed)
Pt had OB/F/U 04/11/2017

## 2017-04-12 NOTE — Progress Notes (Signed)
Subjective:  Sally Garner is a 32 y.o. G2P1001 at [redacted]w[redacted]d being seen today for ongoing prenatal care.  She is currently monitored for the following issues for this high-risk pregnancy and has Anemia; Smoker; Fibroid uterus; Obesity, unspecified--BMI 37.1; Hx of primary hypertension--2-3 years ago; Status post primary low transverse cesarean section--FTP, NRFHR; Pleuritic chest pain; Supervision of high risk pregnancy, antepartum; Chronic hypertension during pregnancy, antepartum; Elevated hemoglobin A1c; and Unwanted fertility on their problem list.  Patient reports general discomforts of pregnancy.  Contractions: Not present. Vag. Bleeding: None.  Movement: Present. Denies leaking of fluid.   The following portions of the patient's history were reviewed and updated as appropriate: allergies, current medications, past family history, past medical history, past social history, past surgical history and problem list. Problem list updated.  Objective:   Vitals:   04/12/17 0952  BP: 118/83  Pulse: (!) 121  Weight: 243 lb (110.2 kg)    Fetal Status: Fetal Heart Rate (bpm): 158   Movement: Present     General:  Alert, oriented and cooperative. Patient is in no acute distress.  Skin: Skin is warm and dry. No rash noted.   Cardiovascular: Normal heart rate noted  Respiratory: Normal respiratory effort, no problems with respiration noted  Abdomen: Soft, gravid, appropriate for gestational age. Pain/Pressure: Present     Pelvic:  Cervical exam deferred        Extremities: Normal range of motion.  Edema: None  Mental Status: Normal mood and affect. Normal behavior. Normal judgment and thought content.   Urinalysis:      Assessment and Plan:  Pregnancy: G2P1001 at [redacted]w[redacted]d  1. Supervision of high risk pregnancy, antepartum Stable  2. Unwanted fertility BTL papers signed  3. Status post primary low transverse cesarean section--FTP, NRFHR TOLAC consent signed  4. Chronic hypertension during  pregnancy, antepartum BP stable without meds Continue with BASA Antenatal testing at 32 weeks Normal growth scan yesterday  Preterm labor symptoms and general obstetric precautions including but not limited to vaginal bleeding, contractions, leaking of fluid and fetal movement were reviewed in detail with the patient. Please refer to After Visit Summary for other counseling recommendations.  Return in about 2 weeks (around 04/26/2017).   Chancy Milroy, MD

## 2017-04-12 NOTE — Patient Instructions (Signed)

## 2017-04-26 ENCOUNTER — Encounter: Payer: Medicaid Other | Admitting: Obstetrics and Gynecology

## 2017-04-26 ENCOUNTER — Ambulatory Visit (INDEPENDENT_AMBULATORY_CARE_PROVIDER_SITE_OTHER): Payer: Medicaid Other | Admitting: Obstetrics and Gynecology

## 2017-04-26 ENCOUNTER — Encounter: Payer: Self-pay | Admitting: Obstetrics and Gynecology

## 2017-04-26 ENCOUNTER — Other Ambulatory Visit: Payer: Medicaid Other

## 2017-04-26 VITALS — BP 123/82 | HR 106 | Wt 244.6 lb

## 2017-04-26 DIAGNOSIS — Z23 Encounter for immunization: Secondary | ICD-10-CM | POA: Diagnosis not present

## 2017-04-26 DIAGNOSIS — Z3009 Encounter for other general counseling and advice on contraception: Secondary | ICD-10-CM

## 2017-04-26 DIAGNOSIS — O0990 Supervision of high risk pregnancy, unspecified, unspecified trimester: Secondary | ICD-10-CM

## 2017-04-26 DIAGNOSIS — O10919 Unspecified pre-existing hypertension complicating pregnancy, unspecified trimester: Secondary | ICD-10-CM

## 2017-04-26 DIAGNOSIS — Z98891 History of uterine scar from previous surgery: Secondary | ICD-10-CM

## 2017-04-26 NOTE — Addendum Note (Signed)
Addended by: Lucianne Lei on: 04/26/2017 12:01 PM   Modules accepted: Orders

## 2017-04-26 NOTE — Progress Notes (Signed)
Subjective:  Sally Garner is a 32 y.o. G2P1001 at [redacted]w[redacted]d being seen today for ongoing prenatal care.  She is currently monitored for the following issues for this high-risk pregnancy and has Anemia; Smoker; Fibroid uterus; Obesity, unspecified--BMI 37.1; Hx of primary hypertension--2-3 years ago; Status post primary low transverse cesarean section--FTP, NRFHR; Pleuritic chest pain; Supervision of high risk pregnancy, antepartum; Chronic hypertension during pregnancy, antepartum; Elevated hemoglobin A1c; and Unwanted fertility on their problem list.  Patient reports tooth pain.  Contractions: Not present. Vag. Bleeding: None.  Movement: Present. Denies leaking of fluid.   The following portions of the patient's history were reviewed and updated as appropriate: allergies, current medications, past family history, past medical history, past social history, past surgical history and problem list. Problem list updated.  Objective:   Vitals:   04/26/17 0928  BP: 123/82  Pulse: (!) 106  Weight: 244 lb 9.6 oz (110.9 kg)    Fetal Status:     Movement: Present     General:  Alert, oriented and cooperative. Patient is in no acute distress.  Skin: Skin is warm and dry. No rash noted.   Cardiovascular: Normal heart rate noted  Respiratory: Normal respiratory effort, no problems with respiration noted  Abdomen: Soft, gravid, appropriate for gestational age. Pain/Pressure: Absent     Pelvic:  Cervical exam deferred        Extremities: Normal range of motion.  Edema: None  Mental Status: Normal mood and affect. Normal behavior. Normal judgment and thought content.   Urinalysis:      Assessment and Plan:  Pregnancy: G2P1001 at [redacted]w[redacted]d  1. Supervision of high risk pregnancy, antepartum Stable Dental letter provided - Glucose Tolerance, 2 Hours w/1 Hour - CBC - HIV antibody - RPR  2. Unwanted fertility BTL papers signed  3. Status post primary low transverse cesarean section--FTP,  NRFHR TOLAC papers signed  4. Chronic hypertension during pregnancy, antepartum BP stable without meds F/U growth scan next month Start antenatal testing at 32 weeks  Preterm labor symptoms and general obstetric precautions including but not limited to vaginal bleeding, contractions, leaking of fluid and fetal movement were reviewed in detail with the patient. Please refer to After Visit Summary for other counseling recommendations.  Return in about 2 weeks (around 05/10/2017) for OB visit.   Chancy Milroy, MD

## 2017-04-27 LAB — CBC
Hematocrit: 31.9 % — ABNORMAL LOW (ref 34.0–46.6)
Hemoglobin: 10.6 g/dL — ABNORMAL LOW (ref 11.1–15.9)
MCH: 29.3 pg (ref 26.6–33.0)
MCHC: 33.2 g/dL (ref 31.5–35.7)
MCV: 88 fL (ref 79–97)
PLATELETS: 262 10*3/uL (ref 150–379)
RBC: 3.62 x10E6/uL — ABNORMAL LOW (ref 3.77–5.28)
RDW: 13.5 % (ref 12.3–15.4)
WBC: 9.6 10*3/uL (ref 3.4–10.8)

## 2017-04-27 LAB — GLUCOSE TOLERANCE, 2 HOURS W/ 1HR
GLUCOSE, 1 HOUR: 167 mg/dL (ref 65–179)
GLUCOSE, FASTING: 88 mg/dL (ref 65–91)
Glucose, 2 hour: 104 mg/dL (ref 65–152)

## 2017-04-27 LAB — RPR: RPR Ser Ql: NONREACTIVE

## 2017-04-27 LAB — HIV ANTIBODY (ROUTINE TESTING W REFLEX): HIV SCREEN 4TH GENERATION: NONREACTIVE

## 2017-05-10 ENCOUNTER — Other Ambulatory Visit: Payer: Self-pay

## 2017-05-10 ENCOUNTER — Ambulatory Visit (INDEPENDENT_AMBULATORY_CARE_PROVIDER_SITE_OTHER): Payer: Medicaid Other | Admitting: Obstetrics & Gynecology

## 2017-05-10 ENCOUNTER — Encounter: Payer: Self-pay | Admitting: Obstetrics & Gynecology

## 2017-05-10 DIAGNOSIS — O10919 Unspecified pre-existing hypertension complicating pregnancy, unspecified trimester: Secondary | ICD-10-CM

## 2017-05-10 NOTE — Patient Instructions (Signed)

## 2017-05-10 NOTE — Progress Notes (Signed)
   PRENATAL VISIT NOTE  Subjective:  Sally Garner is a 32 y.o. G2P1001 at [redacted]w[redacted]d being seen today for ongoing prenatal care.  She is currently monitored for the following issues for this high-risk pregnancy and has Anemia; Smoker; Fibroid uterus; Obesity, unspecified--BMI 37.1; Hx of primary hypertension--2-3 years ago; Status post primary low transverse cesarean section--FTP, NRFHR; Pleuritic chest pain; Supervision of high risk pregnancy, antepartum; Chronic hypertension during pregnancy, antepartum; Elevated hemoglobin A1c; and Unwanted fertility on their problem list.  Patient reports sore after falling in shower yesterday, hurt her leg, abdomen was not involved.  Contractions: Not present. Vag. Bleeding: None.  Movement: Present. Denies leaking of fluid.   The following portions of the patient's history were reviewed and updated as appropriate: allergies, current medications, past family history, past medical history, past social history, past surgical history and problem list. Problem list updated.  Objective:   Vitals:   05/10/17 0904  BP: 124/81  Pulse: (!) 111  Weight: 242 lb 14.4 oz (110.2 kg)    Fetal Status: Fetal Heart Rate (bpm): 145   Movement: Present     General:  Alert, oriented and cooperative. Patient is in no acute distress.  Skin: Skin is warm and dry. No rash noted.   Cardiovascular: Normal heart rate noted  Respiratory: Normal respiratory effort, no problems with respiration noted  Abdomen: Soft, gravid, appropriate for gestational age.  Pain/Pressure: Present     Pelvic: Cervical exam deferred        Extremities: Normal range of motion.  Edema: Trace  Mental Status:  Normal mood and affect. Normal behavior. Normal judgment and thought content.   1. Chronic hypertension during pregnancy, antepartum BP normal, start fetal testing 32 weeks, f/u US is scheduled  Preterm labor symptoms and general obstetric precautions including but not limited to vaginal  bleeding, contractions, leaking of fluid and fetal movement were reviewed in detail with the patient. Please refer to After Visit Summary for other counseling recommendations.  Return in about 2 weeks (around 05/24/2017).   Emeterio Reeve, MD

## 2017-05-10 NOTE — Progress Notes (Signed)
ROB. C/o intermittent pelvic pain and pressure 7/10 x 5 days. She informed us that she fell in the shower last night, hitting her right leg. She didn't go the Hospital.  Denies LOF or bleeding.

## 2017-05-11 IMAGING — CR DG CHEST 2V
2 series · 2 of 2 positions shown · non-contrast
Comparison: 02/20/2015

CLINICAL DATA: Chest pain.  Hypertension

EXAM:
CHEST  2 VIEW

[chest pa]
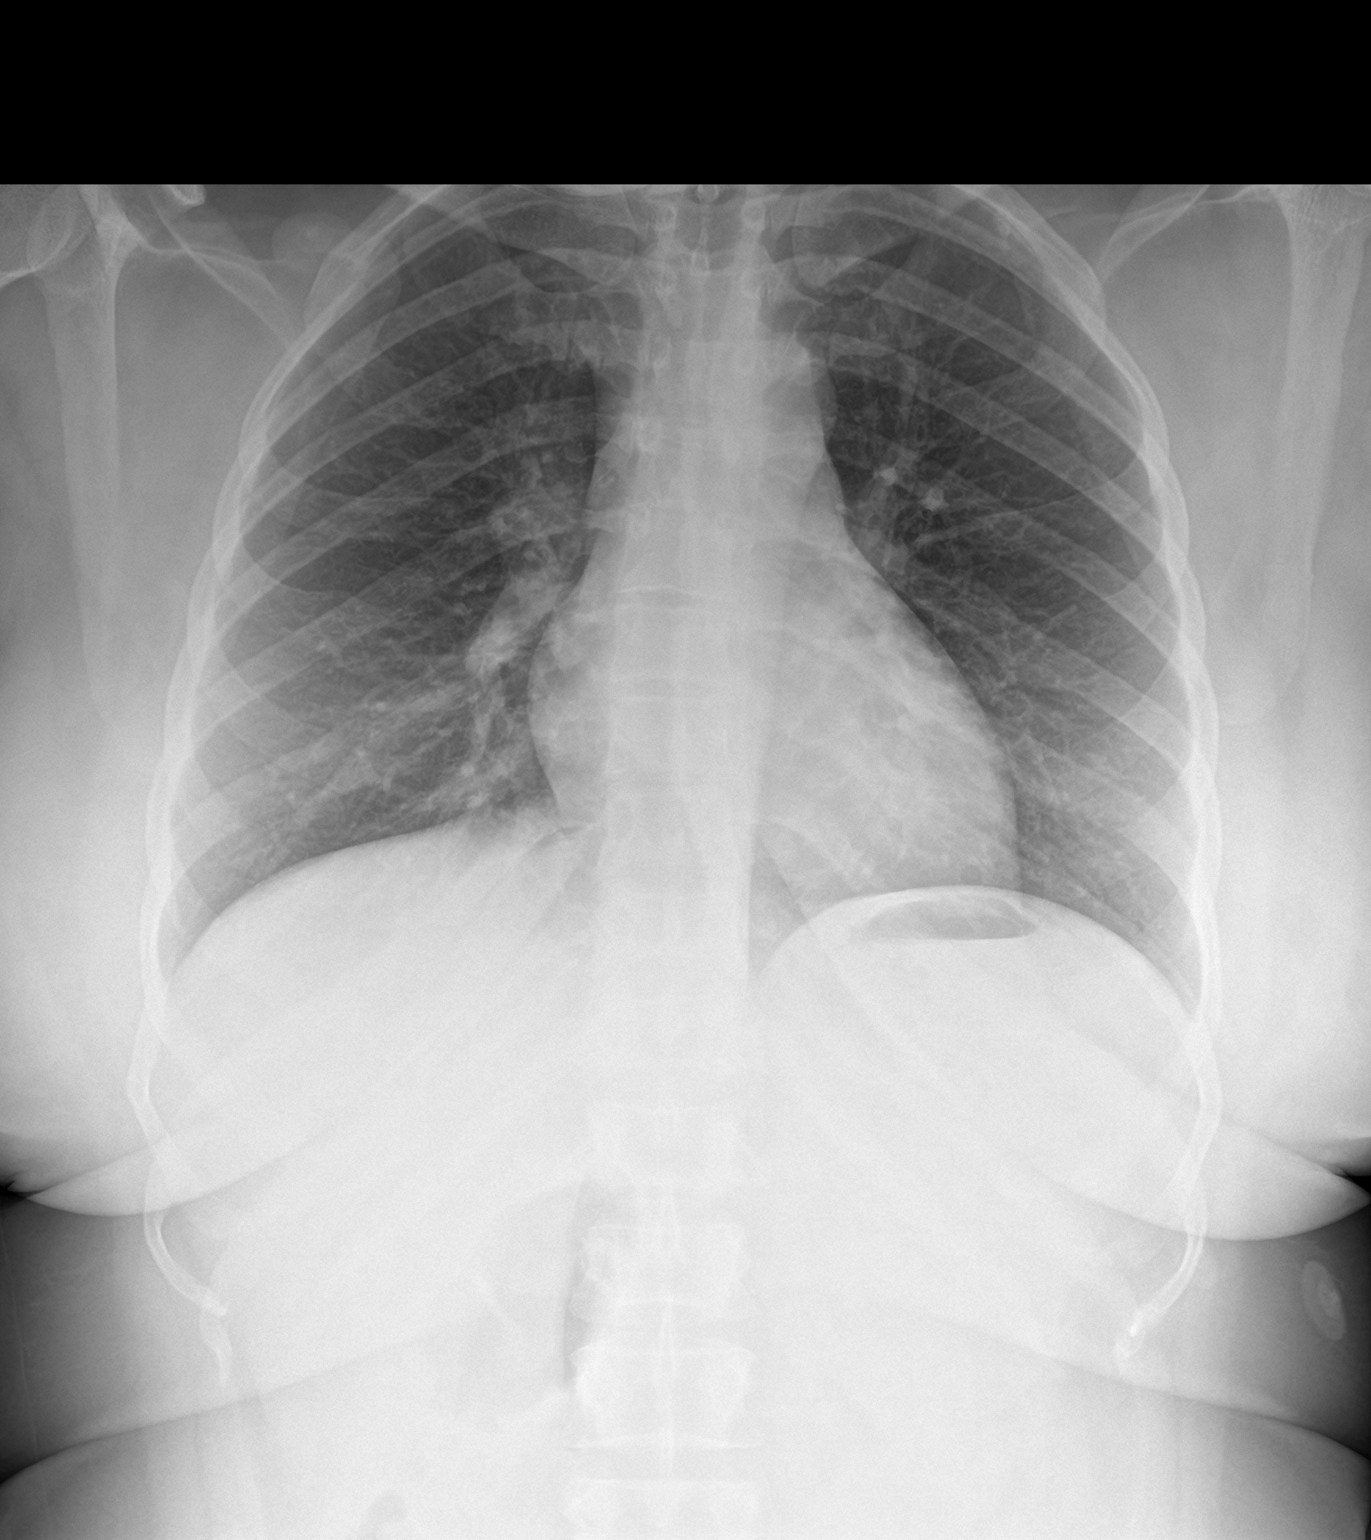

[chest lat]
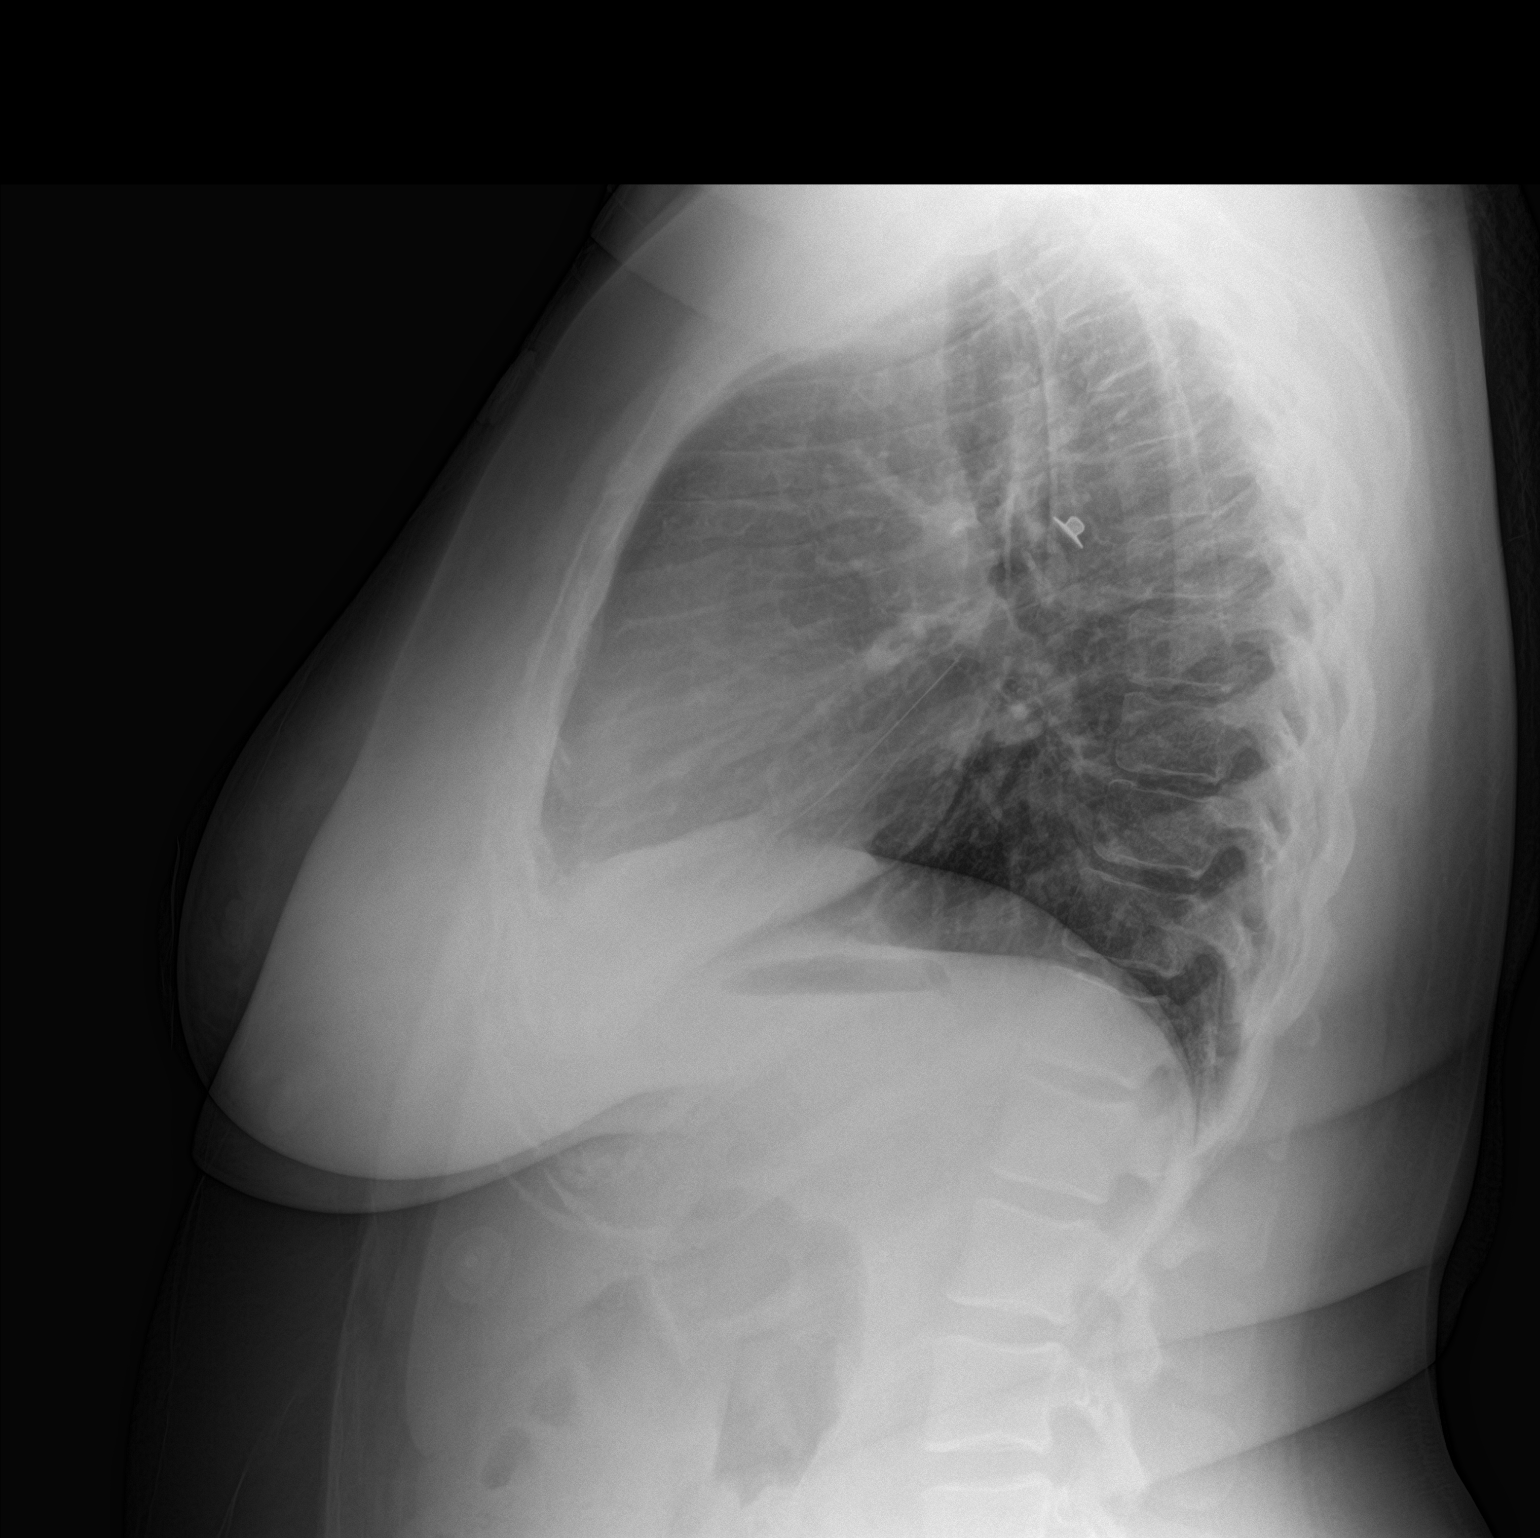

[2 of 2 positions shown; findings below may reference images not displayed]

FINDINGS: The heart size and mediastinal contours are within normal limits.
Both lungs are clear. The visualized skeletal structures are
unremarkable.
IMPRESSION: No active cardiopulmonary disease.

## 2017-05-12 ENCOUNTER — Encounter (HOSPITAL_COMMUNITY): Payer: Self-pay | Admitting: *Deleted

## 2017-05-12 ENCOUNTER — Emergency Department (HOSPITAL_COMMUNITY): Payer: Medicaid Other

## 2017-05-12 ENCOUNTER — Other Ambulatory Visit: Payer: Self-pay

## 2017-05-12 ENCOUNTER — Emergency Department (HOSPITAL_COMMUNITY)
Admission: EM | Admit: 2017-05-12 | Discharge: 2017-05-12 | Disposition: A | Discharge status: Home / Self Care | Payer: Medicaid Other | Attending: Emergency Medicine | Admitting: Emergency Medicine

## 2017-05-12 DIAGNOSIS — R05 Cough: Secondary | ICD-10-CM | POA: Diagnosis not present

## 2017-05-12 DIAGNOSIS — O99333 Smoking (tobacco) complicating pregnancy, third trimester: Secondary | ICD-10-CM | POA: Diagnosis not present

## 2017-05-12 DIAGNOSIS — J069 Acute upper respiratory infection, unspecified: Secondary | ICD-10-CM | POA: Insufficient documentation

## 2017-05-12 DIAGNOSIS — O10013 Pre-existing essential hypertension complicating pregnancy, third trimester: Secondary | ICD-10-CM | POA: Diagnosis not present

## 2017-05-12 DIAGNOSIS — Z3A30 30 weeks gestation of pregnancy: Secondary | ICD-10-CM | POA: Diagnosis not present

## 2017-05-12 DIAGNOSIS — Z7982 Long term (current) use of aspirin: Secondary | ICD-10-CM | POA: Diagnosis not present

## 2017-05-12 DIAGNOSIS — O99513 Diseases of the respiratory system complicating pregnancy, third trimester: Secondary | ICD-10-CM | POA: Insufficient documentation

## 2017-05-12 DIAGNOSIS — B9789 Other viral agents as the cause of diseases classified elsewhere: Secondary | ICD-10-CM

## 2017-05-12 DIAGNOSIS — F17210 Nicotine dependence, cigarettes, uncomplicated: Secondary | ICD-10-CM | POA: Diagnosis not present

## 2017-05-12 DIAGNOSIS — O219 Vomiting of pregnancy, unspecified: Secondary | ICD-10-CM | POA: Diagnosis present

## 2017-05-12 DIAGNOSIS — Z79899 Other long term (current) drug therapy: Secondary | ICD-10-CM | POA: Insufficient documentation

## 2017-05-12 LAB — URINALYSIS, ROUTINE W REFLEX MICROSCOPIC
BILIRUBIN URINE: NEGATIVE
Glucose, UA: NEGATIVE mg/dL
HGB URINE DIPSTICK: NEGATIVE
Ketones, ur: 5 mg/dL — AB
Leukocytes, UA: NEGATIVE
Nitrite: NEGATIVE
PH: 6 (ref 5.0–8.0)
Protein, ur: NEGATIVE mg/dL
SPECIFIC GRAVITY, URINE: 1.019 (ref 1.005–1.030)

## 2017-05-12 LAB — COMPREHENSIVE METABOLIC PANEL
ALBUMIN: 2.8 g/dL — AB (ref 3.5–5.0)
ALK PHOS: 77 U/L (ref 38–126)
ALT: 8 U/L — ABNORMAL LOW (ref 14–54)
ANION GAP: 10 (ref 5–15)
AST: 15 U/L (ref 15–41)
CALCIUM: 8.9 mg/dL (ref 8.9–10.3)
CO2: 21 mmol/L — AB (ref 22–32)
Chloride: 103 mmol/L (ref 101–111)
Creatinine, Ser: 0.5 mg/dL (ref 0.44–1.00)
GFR calc Af Amer: >60 mL/min (ref >60)
GFR calc non Af Amer: >60 mL/min (ref >60)
GLUCOSE: 99 mg/dL (ref 65–99)
Potassium: 3.1 mmol/L — ABNORMAL LOW (ref 3.5–5.1)
SODIUM: 134 mmol/L — AB (ref 135–145)
Total Bilirubin: 0.3 mg/dL (ref 0.3–1.2)
Total Protein: 6.3 g/dL — ABNORMAL LOW (ref 6.5–8.1)

## 2017-05-12 LAB — CBC
HEMATOCRIT: 28.1 % — AB (ref 36.0–46.0)
HEMOGLOBIN: 9.6 g/dL — AB (ref 12.0–15.0)
MCH: 29.6 pg (ref 26.0–34.0)
MCHC: 34.2 g/dL (ref 30.0–36.0)
MCV: 86.7 fL (ref 78.0–100.0)
Platelets: 282 10*3/uL (ref 150–400)
RBC: 3.24 MIL/uL — ABNORMAL LOW (ref 3.87–5.11)
RDW: 12.6 % (ref 11.5–15.5)
WBC: 10.5 10*3/uL (ref 4.0–10.5)

## 2017-05-12 MED ORDER — DM-GUAIFENESIN ER 30-600 MG PO TB12
1.0000 | ORAL_TABLET | Freq: Two times a day (BID) | ORAL | Status: DC
  Administered 2017-05-12: 1 via ORAL
  Filled 2017-05-12: qty 1

## 2017-05-12 MED ORDER — DM-GUAIFENESIN ER 30-600 MG PO TB12: 1.0000 | ORAL_TABLET | Freq: Two times a day (BID) | ORAL | 0 refills | Status: AC

## 2017-05-12 MED ORDER — POTASSIUM CHLORIDE CRYS ER 20 MEQ PO TBCR
40.0000 meq | EXTENDED_RELEASE_TABLET | Freq: Once | ORAL | Status: AC
  Administered 2017-05-12: 40 meq via ORAL
  Filled 2017-05-12: qty 2

## 2017-05-12 MED ORDER — ONDANSETRON HCL 4 MG PO TABS
4.0000 mg | ORAL_TABLET | Freq: Once | ORAL | Status: AC
  Administered 2017-05-12: 4 mg via ORAL
  Filled 2017-05-12: qty 1

## 2017-05-12 MED ORDER — ACETAMINOPHEN 325 MG PO TABS
650.0000 mg | ORAL_TABLET | Freq: Once | ORAL | Status: AC
  Administered 2017-05-12: 650 mg via ORAL
  Filled 2017-05-12: qty 2

## 2017-05-12 NOTE — ED Triage Notes (Signed)
Pt is approx 7 months pregnant and reports having a sore throat and severe cold symptoms. Denies fever. Having n/v today. Denies diarrhea.

## 2017-05-12 NOTE — ED Provider Notes (Signed)
[facility] EMERGENCY DEPARTMENT Provider Note   CSN: [id number] Arrival date & time: 05/12/17  1730     History   Chief Complaint Chief Complaint  Patient presents with  . Cough  . URI  . Emesis    HPI Sally Garner is a 32 y.o. female.  The history is provided by the patient.  URI   This is a new problem. The current episode started more than 2 days ago. The problem has been gradually worsening. There has been no fever. Associated symptoms include vomiting (post tussive), congestion, rhinorrhea and cough. Pertinent negatives include no chest pain, no abdominal pain, no nausea, no dysuria, no ear pain, no headaches, no sore throat, no neck pain and no rash.    Past Medical History:  Diagnosis Date  . Hypertension    was put on medication end of 03/2015; states has caused headache and is not taking as prescribed  . Migraines   . Symptomatic cholelithiasis 04/2015    Patient Active Problem List   Diagnosis Date Noted  . Unwanted fertility 03/23/2017  . Elevated hemoglobin A1c 01/03/2017  . Chronic hypertension during pregnancy, antepartum 01/02/2017  . Supervision of high risk pregnancy, antepartum 12/29/2016  . Pleuritic chest pain 07/12/2013  . Status post primary low transverse cesarean section--FTP, NRFHR 05/26/2013  . Anemia 05/24/2013  . Smoker 05/24/2013  . Fibroid uterus 05/24/2013  . Obesity, unspecified--BMI 37.1 05/24/2013  . Hx of primary hypertension--2-3 years ago 05/24/2013    Past Surgical History:  Procedure Laterality Date  . CESAREAN SECTION N/A 05/25/2013   Procedure: CESAREAN SECTION;  Surgeon: Annalee Genta, DO;  Location: [facility];  Service: Obstetrics;  Laterality: N/A;  . CHOLECYSTECTOMY N/A 05/05/2015   Procedure: LAPAROSCOPIC CHOLECYSTECTOMY;  Surgeon: Coralie Keens, MD;  Location: [facility];  Service: General;  Laterality: N/A;     OB History    Gravida  2   Para  1   Term  1   Preterm      AB      Living  1     SAB      TAB      Ectopic      Multiple      Live Births  1            Home Medications    Prior to Admission medications   Medication Sig Start Date End Date Taking? Authorizing Provider  aspirin EC 81 MG tablet Take 1 tablet (81 mg total) by mouth daily. Take after 12 weeks for prevention of preeclampsia later in pregnancy 02/23/17  Yes Sloan Leiter, MD  calcium carbonate (TUMS - DOSED IN MG ELEMENTAL CALCIUM) 500 MG chewable tablet Chew 1 tablet by mouth 2 (two) times daily as needed for indigestion or heartburn.   Yes [provider]  Prenatal MV-Min-FA-Omega-3 (PRENATAL GUMMIES/DHA & FA) 0.4-32.5 MG CHEW Chew by mouth.   Yes [provider]  dextromethorphan-guaiFENesin (MUCINEX DM) 30-600 MG 12hr tablet Take 1 tablet by mouth 2 (two) times daily for 5 days. 05/12/17 05/17/17  Tobie Poet, DO  Doxylamine-Pyridoxine (DICLEGIS) 10-10 MG TBEC Take 1 tablet with breakfast and lunch.  Take 2 tablets at bedtime. Patient not taking: Reported on 05/12/2017 12/29/16   Morene Crocker, CNM  Prenatal Vit-Fe Fumarate-FA (PRENATAL MULTIVITAMIN) TABS tablet Take 1 tablet by mouth daily at 12 noon.    [provider]    Family History Family History  Problem Relation Age of  Onset  . Hypertension Maternal Aunt   . Hypertension Maternal Uncle   . Birth defects Brother        polydactyl  . Cancer Paternal Aunt        breast cancer    Social History Social History   Tobacco Use  . Smoking status: Current Some Day Smoker    Packs/day: 0.50    Years: 3.00    Pack years: 1.50    Types: Cigarettes  . Smokeless tobacco: Never Used  Substance Use Topics  . Alcohol use: Yes    Comment: occasional  . Drug use: No     Allergies   Patient has no known allergies.   Review of Systems Review of Systems  Constitutional: Positive for chills. Negative for fever.  HENT: Positive for congestion and rhinorrhea. Negative for ear pain  and sore throat.   Eyes: Negative for pain and visual disturbance.  Respiratory: Positive for cough. Negative for chest tightness and shortness of breath.   Cardiovascular: Negative for chest pain and palpitations.  Gastrointestinal: Positive for vomiting (post tussive). Negative for abdominal distention, abdominal pain and nausea.  Genitourinary: Negative for dysuria.  Musculoskeletal: Negative for neck pain.  Skin: Negative for rash.  Neurological: Negative for headaches.  All other systems reviewed and are negative.    Physical Exam Updated Vital Signs BP 135/87 (BP Location: Right Arm)   Pulse (!) 104   Temp 98.6 F (37 C) (Oral)   Resp 18   LMP 10/13/2016   SpO2 100%   Physical Exam  Constitutional: She appears well-developed and well-nourished. No distress.  HENT:  Head: Normocephalic and atraumatic.  Mouth/Throat: Oropharynx is clear and moist.  Eyes: Conjunctivae are normal.  Neck: Neck supple.  Cardiovascular: Normal rate and regular rhythm.  No murmur heard. Pulmonary/Chest: Effort normal and breath sounds normal. No respiratory distress. She has no wheezes. She has no rales.  Abdominal: Soft. She exhibits no distension. There is no tenderness. There is no guarding.  Musculoskeletal: She exhibits no edema.  Neurological: She is alert.  Skin: Skin is warm and dry.  Psychiatric: She has a normal mood and affect.  Nursing note and vitals reviewed.    ED Treatments / Results  Labs (all labs ordered are listed, but only abnormal results are displayed) Labs Reviewed  COMPREHENSIVE METABOLIC PANEL - Abnormal; Notable for the following components:      Result Value   Sodium 134 (*)    Potassium 3.1 (*)    CO2 21 (*)    BUN <5 (*)    Total Protein 6.3 (*)    Albumin 2.8 (*)    ALT 8 (*)    All other components within normal limits  CBC - Abnormal; Notable for the following components:   RBC 3.24 (*)    Hemoglobin 9.6 (*)    HCT 28.1 (*)    All other  components within normal limits  URINALYSIS, ROUTINE W REFLEX MICROSCOPIC - Abnormal; Notable for the following components:   Ketones, ur 5 (*)    All other components within normal limits    EKG None  Radiology Dg Chest 2 View  Result Date: 05/12/2017 CLINICAL DATA:  Cough and pregnancy EXAM: CHEST - 2 VIEW COMPARISON:  Chest radiograph 08/07/2015 FINDINGS: The heart size and mediastinal contours are within normal limits. Both lungs are clear. The visualized skeletal structures are unremarkable. IMPRESSION: Clear lungs. Electronically Signed   By: Ulyses Jarred M.D.   On: 05/12/2017 22:38  Procedures Procedures (including critical care time)  Medications Ordered in ED Medications  dextromethorphan-guaiFENesin (MUCINEX DM) 30-600 MG per 12 hr tablet 1 tablet (1 tablet Oral Given 05/12/17 2152)  potassium chloride SA (K-DUR,KLOR-CON) CR tablet 40 mEq (40 mEq Oral Given 05/12/17 2151)  acetaminophen (TYLENOL) tablet 650 mg (650 mg Oral Given 05/12/17 2151)  ondansetron (ZOFRAN) tablet 4 mg (4 mg Oral Given 05/12/17 2152)     Initial Impression / Assessment and Plan / ED Course  I have reviewed the triage vital signs and the nursing notes.  Pertinent labs & imaging results that were available during my care of the patient were reviewed by me and considered in my medical decision making (see chart for details).     Patient is a 32 year old female with history of hypertension, migraines who is currently [redacted] weeks pregnant who presents with a few days of URI symptoms and bronchitis symptoms.  Started having runny nose and congestion in the last 2 days that is had chest congestion with cough that is productive of some phlegm.  On exam patient is in no acute distress but is mildly tachycardic.  Afebrile here and no tachypnea or hypoxia.    Labs were obtained as above which showed a mild hypokalemia which was repleted orally.  Her hemoglobin is also slightly lower than her baseline.   Patient states she has not been good about taking her prenatal vitamins.  I counseled her that she is to take [place] does have vitamins and iron that helps keep her blood counts up especially while she is pregnant.  Patient is also counseled on smoking cessation given she is pregnant and also for general health.  Chest x-ray obtained to rule out pneumonia which was negative.    Patient likely has a viral URI/bronchitis.  Patient to take Mucinex DM for symptomatic treatment.  Patient to follow-up with PCP as needed.    Final Clinical Impressions(s) / ED Diagnoses   Final diagnoses:  Viral URI with cough    ED Discharge Orders        Ordered    dextromethorphan-guaiFENesin [facility] DM) 30-600 MG 12hr tablet  2 times daily     05/12/17 2250       Tobie Poet, DO 05/12/17 2310

## 2017-05-12 NOTE — ED Provider Notes (Signed)
I saw and evaluated the patient, reviewed the resident's note and I agree with the findings and plan.   EKG Interpretation None     32 year old female presents with URI symptoms several days.  Did have some nausea vomiting but more posttussive.  Urinalysis shows very low ketones.  Chest x-ray without infiltrate.  Suspect URI and patient stable for discharge   Lacretia Leigh, MD 05/12/17 2248

## 2017-05-23 ENCOUNTER — Emergency Department (HOSPITAL_COMMUNITY)
Admission: EM | Admit: 2017-05-23 | Discharge: 2017-05-23 | Disposition: A | Discharge status: Home / Self Care | Payer: Medicaid Other | Attending: Emergency Medicine | Admitting: Emergency Medicine

## 2017-05-23 ENCOUNTER — Other Ambulatory Visit: Payer: Self-pay

## 2017-05-23 ENCOUNTER — Emergency Department (HOSPITAL_COMMUNITY): Payer: Medicaid Other

## 2017-05-23 ENCOUNTER — Ambulatory Visit (HOSPITAL_COMMUNITY)
Admission: RE | Admit: 2017-05-23 | Discharge: 2017-05-23 | Disposition: A | Discharge status: Home / Self Care | Payer: Medicaid Other | Source: Ambulatory Visit | Attending: Certified Nurse Midwife | Admitting: Certified Nurse Midwife

## 2017-05-23 ENCOUNTER — Other Ambulatory Visit (HOSPITAL_COMMUNITY): Payer: Self-pay | Admitting: *Deleted

## 2017-05-23 ENCOUNTER — Encounter (HOSPITAL_COMMUNITY): Payer: Self-pay

## 2017-05-23 ENCOUNTER — Other Ambulatory Visit (HOSPITAL_COMMUNITY): Payer: Self-pay | Admitting: Obstetrics and Gynecology

## 2017-05-23 DIAGNOSIS — R112 Nausea with vomiting, unspecified: Secondary | ICD-10-CM | POA: Insufficient documentation

## 2017-05-23 DIAGNOSIS — O10919 Unspecified pre-existing hypertension complicating pregnancy, unspecified trimester: Secondary | ICD-10-CM | POA: Insufficient documentation

## 2017-05-23 DIAGNOSIS — Z87891 Personal history of nicotine dependence: Secondary | ICD-10-CM | POA: Diagnosis not present

## 2017-05-23 DIAGNOSIS — O99213 Obesity complicating pregnancy, third trimester: Secondary | ICD-10-CM | POA: Diagnosis not present

## 2017-05-23 DIAGNOSIS — O34219 Maternal care for unspecified type scar from previous cesarean delivery: Secondary | ICD-10-CM | POA: Diagnosis not present

## 2017-05-23 DIAGNOSIS — O99333 Smoking (tobacco) complicating pregnancy, third trimester: Secondary | ICD-10-CM | POA: Diagnosis not present

## 2017-05-23 DIAGNOSIS — Z3A31 31 weeks gestation of pregnancy: Secondary | ICD-10-CM

## 2017-05-23 DIAGNOSIS — O09293 Supervision of pregnancy with other poor reproductive or obstetric history, third trimester: Secondary | ICD-10-CM | POA: Insufficient documentation

## 2017-05-23 DIAGNOSIS — Z7982 Long term (current) use of aspirin: Secondary | ICD-10-CM | POA: Diagnosis not present

## 2017-05-23 DIAGNOSIS — Z362 Encounter for other antenatal screening follow-up: Secondary | ICD-10-CM | POA: Diagnosis not present

## 2017-05-23 DIAGNOSIS — Z331 Pregnant state, incidental: Secondary | ICD-10-CM | POA: Diagnosis not present

## 2017-05-23 DIAGNOSIS — R05 Cough: Secondary | ICD-10-CM | POA: Diagnosis present

## 2017-05-23 DIAGNOSIS — O09299 Supervision of pregnancy with other poor reproductive or obstetric history, unspecified trimester: Secondary | ICD-10-CM

## 2017-05-23 DIAGNOSIS — J209 Acute bronchitis, unspecified: Secondary | ICD-10-CM | POA: Diagnosis not present

## 2017-05-23 DIAGNOSIS — E860 Dehydration: Secondary | ICD-10-CM | POA: Insufficient documentation

## 2017-05-23 DIAGNOSIS — Z7722 Contact with and (suspected) exposure to environmental tobacco smoke (acute) (chronic): Secondary | ICD-10-CM

## 2017-05-23 LAB — URINALYSIS, ROUTINE W REFLEX MICROSCOPIC
Bilirubin Urine: NEGATIVE
Glucose, UA: NEGATIVE mg/dL
HGB URINE DIPSTICK: NEGATIVE
Ketones, ur: 80 mg/dL — AB
NITRITE: NEGATIVE
Protein, ur: 30 mg/dL — AB
SPECIFIC GRAVITY, URINE: 1.018 (ref 1.005–1.030)
pH: 7 (ref 5.0–8.0)

## 2017-05-23 LAB — BASIC METABOLIC PANEL
Anion gap: 10 (ref 5–15)
CALCIUM: 8.7 mg/dL — AB (ref 8.9–10.3)
CO2: 22 mmol/L (ref 22–32)
CREATININE: 0.31 mg/dL — AB (ref 0.44–1.00)
Chloride: 105 mmol/L (ref 101–111)
GFR calc Af Amer: >60 mL/min (ref >60)
Glucose, Bld: 66 mg/dL (ref 65–99)
Potassium: 3.1 mmol/L — ABNORMAL LOW (ref 3.5–5.1)
SODIUM: 137 mmol/L (ref 135–145)

## 2017-05-23 LAB — CBC WITH DIFFERENTIAL/PLATELET
Basophils Absolute: 0 10*3/uL (ref 0.0–0.1)
Basophils Relative: 0 %
Eosinophils Absolute: 0.2 10*3/uL (ref 0.0–0.7)
Eosinophils Relative: 2 %
HEMATOCRIT: 28.5 % — AB (ref 36.0–46.0)
Hemoglobin: 9.7 g/dL — ABNORMAL LOW (ref 12.0–15.0)
Lymphocytes Relative: 20 %
Lymphs Abs: 1.9 10*3/uL (ref 0.7–4.0)
MCH: 29.8 pg (ref 26.0–34.0)
MCHC: 34 g/dL (ref 30.0–36.0)
MCV: 87.4 fL (ref 78.0–100.0)
MONO ABS: 1.1 10*3/uL — AB (ref 0.1–1.0)
Monocytes Relative: 11 %
NEUTROS ABS: 6.5 10*3/uL (ref 1.7–7.7)
Neutrophils Relative %: 67 %
Other: 0 %
Platelets: 280 10*3/uL (ref 150–400)
RBC: 3.26 MIL/uL — AB (ref 3.87–5.11)
RDW: 13 % (ref 11.5–15.5)
WBC: 9.7 10*3/uL (ref 4.0–10.5)

## 2017-05-23 MED ORDER — AMOXICILLIN 500 MG PO CAPS
500.0000 mg | ORAL_CAPSULE | Freq: Once | ORAL | Status: AC
  Administered 2017-05-23: 500 mg via ORAL
  Filled 2017-05-23: qty 1

## 2017-05-23 MED ORDER — AMOXICILLIN 500 MG PO CAPS: 500.0000 mg | ORAL_CAPSULE | Freq: Three times a day (TID) | ORAL | 0 refills | Status: DC

## 2017-05-23 MED ORDER — SODIUM CHLORIDE 0.9 % IV BOLUS
1000.0000 mL | Freq: Once | INTRAVENOUS | Status: AC
Start: 2017-05-23 — End: 2017-05-23
  Administered 2017-05-23: 1000 mL via INTRAVENOUS

## 2017-05-23 MED ORDER — ALBUTEROL SULFATE HFA 108 (90 BASE) MCG/ACT IN AERS
1.0000 | INHALATION_SPRAY | RESPIRATORY_TRACT | Status: DC | PRN
  Administered 2017-05-23: 2 via RESPIRATORY_TRACT
  Filled 2017-05-23: qty 6.7

## 2017-05-23 MED ORDER — ONDANSETRON HCL 4 MG/2ML IJ SOLN
4.0000 mg | Freq: Once | INTRAMUSCULAR | Status: AC
  Administered 2017-05-23: 4 mg via INTRAVENOUS
  Filled 2017-05-23: qty 2

## 2017-05-23 MED ORDER — ONDANSETRON 4 MG PO TBDP: 4.0000 mg | ORAL_TABLET | Freq: Three times a day (TID) | ORAL | 0 refills | Status: DC | PRN

## 2017-05-23 MED ORDER — IPRATROPIUM-ALBUTEROL 0.5-2.5 (3) MG/3ML IN SOLN
3.0000 mL | Freq: Once | RESPIRATORY_TRACT | Status: AC
  Administered 2017-05-23: 3 mL via RESPIRATORY_TRACT
  Filled 2017-05-23: qty 3

## 2017-05-23 MED ORDER — AEROCHAMBER PLUS FLO-VU MEDIUM MISC
1.0000 | Freq: Once | Status: AC
  Administered 2017-05-23: 1
  Filled 2017-05-23: qty 1

## 2017-05-23 NOTE — ED Provider Notes (Signed)
[facility] DEPT Provider Note   CSN: [id number] Arrival date & time: 05/23/17  1254     History   Chief Complaint Chief Complaint  Patient presents with  . Cough  . Emesis    HPI Sally Garner is a 32 y.o. female.  Pt presents to the ED today with sob and cough.  She has also been vomiting.  She is 8 months pregnant.  She has an appt with her obgyn tomorrow.  She said she's been unable to keep anything down since yesterday.  She was seen in the ED on 3/22 for the same.  She said she's been getting worse.  She did quit smoking 2 weeks ago, but significant other smokes.  Room smells strongly of smoke.  Pt denies f/c.  She is feeling baby move.     Past Medical History:  Diagnosis Date  . Hypertension    was put on medication end of 03/2015; states has caused headache and is not taking as prescribed  . Migraines   . Symptomatic cholelithiasis 04/2015    Patient Active Problem List   Diagnosis Date Noted  . Pregnancy with poor obstetric history   . [redacted] weeks gestation of pregnancy   . Obesity affecting pregnancy in third trimester   . HTN in pregnancy, chronic   . Unwanted fertility 03/23/2017  . Elevated hemoglobin A1c 01/03/2017  . Chronic hypertension during pregnancy, antepartum 01/02/2017  . Supervision of high risk pregnancy, antepartum 12/29/2016  . Pleuritic chest pain 07/12/2013  . Status post primary low transverse cesarean section--FTP, NRFHR 05/26/2013  . Anemia 05/24/2013  . Smoker 05/24/2013  . Fibroid uterus 05/24/2013  . Obesity, unspecified--BMI 37.1 05/24/2013  . Hx of primary hypertension--2-3 years ago 05/24/2013    Past Surgical History:  Procedure Laterality Date  . CESAREAN SECTION N/A 05/25/2013   Procedure: CESAREAN SECTION;  Surgeon: Annalee Genta, DO;  Location: [facility];  Service: Obstetrics;  Laterality: N/A;  . CHOLECYSTECTOMY N/A 05/05/2015   Procedure: LAPAROSCOPIC CHOLECYSTECTOMY;  Surgeon: Coralie Keens, MD;  Location: [place] [facility] [place];  Service: General;  Laterality: N/A;     OB History    Gravida  2   Para  1   Term  1   Preterm      AB      Living  1     SAB      TAB      Ectopic      Multiple      Live Births  1            Home Medications    Prior to Admission medications   Medication Sig Start Date End Date Taking? Authorizing Provider  aspirin EC 81 MG tablet Take 1 tablet (81 mg total) by mouth daily. Take after 12 weeks for prevention of preeclampsia later in pregnancy 02/23/17  Yes Sloan Leiter, MD  calcium carbonate (TUMS - DOSED IN MG ELEMENTAL CALCIUM) 500 MG chewable tablet Chew 1 tablet by mouth 2 (two) times daily as needed for indigestion or heartburn.   Yes [provider]  Prenatal MV-Min-FA-Omega-3 (PRENATAL GUMMIES/DHA & FA) 0.4-32.5 MG CHEW Chew by mouth.   Yes [provider]  Prenatal Vit-Fe Fumarate-FA (PRENATAL MULTIVITAMIN) TABS tablet Take 1 tablet by mouth daily at 12 noon.   Yes [provider]  amoxicillin (AMOXIL) 500 MG capsule Take 1 capsule (500 mg total) by mouth 3 (three) times daily. 05/23/17   Isla Pence,  MD  Doxylamine-Pyridoxine (DICLEGIS) 10-10 MG TBEC Take 1 tablet with breakfast and lunch.  Take 2 tablets at bedtime. Patient not taking: Reported on 05/12/2017 12/29/16   Kandis Cocking A, CNM  ondansetron (ZOFRAN ODT) 4 MG disintegrating tablet Take 1 tablet (4 mg total) by mouth every 8 (eight) hours as needed. 05/23/17   Isla Pence, MD    Family History Family History  Problem Relation Age of Onset  . Hypertension Maternal Aunt   . Hypertension Maternal Uncle   . Birth defects Brother        polydactyl  . Cancer Paternal Aunt        breast cancer    Social History Social History   Tobacco Use  . Smoking status: Current Some Day Smoker    Packs/day: 0.50    Years: 3.00    Pack years: 1.50    Types: Cigarettes  . Smokeless tobacco: Never Used  Substance  Use Topics  . Alcohol use: Yes    Comment: occasional  . Drug use: No     Allergies   Patient has no known allergies.   Review of Systems Review of Systems  Respiratory: Positive for cough, shortness of breath and wheezing.   All other systems reviewed and are negative.    Physical Exam Updated Vital Signs BP 126/76   Pulse (!) 103   Temp 98.4 F (36.9 C) (Oral)   Resp 18   Ht 5\' 4"  (1.626 m)   Wt 110.2 kg (243 lb)   LMP 10/13/2016   SpO2 100%   BMI 41.71 kg/m   Physical Exam  Constitutional: She is oriented to person, place, and time. She appears well-developed and well-nourished.  HENT:  Head: Normocephalic and atraumatic.  Right Ear: External ear normal.  Left Ear: External ear normal.  Nose: Nose normal.  Mouth/Throat: Oropharynx is clear and moist.  Eyes: Pupils are equal, round, and reactive to light. Conjunctivae and EOM are normal.  Neck: Normal range of motion. Neck supple.  Cardiovascular: Regular rhythm, normal heart sounds and intact distal pulses. Tachycardia present.  Pulmonary/Chest: Effort normal. She has wheezes.  Abdominal: Soft. Bowel sounds are normal.  + gravid abdomen  Musculoskeletal: Normal range of motion.  Neurological: She is alert and oriented to person, place, and time.  Skin: Skin is warm. Capillary refill takes less than 2 seconds.  Psychiatric: She has a normal mood and affect. Her behavior is normal. Judgment and thought content normal.  Nursing note and vitals reviewed.    ED Treatments / Results  Labs (all labs ordered are listed, but only abnormal results are displayed) Labs Reviewed  URINALYSIS, ROUTINE W REFLEX MICROSCOPIC - Abnormal; Notable for the following components:      Result Value   Ketones, ur 80 (*)    Protein, ur 30 (*)    Leukocytes, UA MODERATE (*)    Bacteria, UA RARE (*)    Squamous Epithelial / LPF 6-30 (*)    All other components within normal limits  BASIC METABOLIC PANEL  CBC WITH  DIFFERENTIAL/PLATELET    EKG None  Radiology Dg Chest 2 View  Result Date: 05/23/2017 CLINICAL DATA:  32 y/o F; 2 weeks of cold symptoms. Onset of vomiting this morning. Pregnant patient. EXAM: CHEST - 2 VIEW COMPARISON:  05/12/2017 chest radiograph FINDINGS: Stable heart size and mediastinal contours are within normal limits. Both lungs are clear. The visualized skeletal structures are unremarkable. IMPRESSION: No acute pulmonary process identified. Electronically Signed   By:  Kristine Garbe M.D.   On: 05/23/2017 15:22   [place] Mfm Ob Follow Up  Result Date: 05/23/2017 ----------------------------------------------------------------------  OBSTETRICS REPORT                      (Signed Final 05/23/2017 10:54 am) ---------------------------------------------------------------------- Patient Info  ID #:       [id number]                          D.O.B.:  09-17-1985 (31 yrs)  Name:       Sally Garner                 Visit Date: 05/23/2017 09:35 am ---------------------------------------------------------------------- Performed By  Performed By:     Elisabeth Cara        Ref. Address:     [address] [place]                                                             [place]  Attending:        Oralia Rud       Location:         [facility]                    MD  Referred By:      Morene Crocker CNM ---------------------------------------------------------------------- Orders   #  Description                                 Code   1  [place] MFM OB FOLLOW UP                         [id number]  ----------------------------------------------------------------------   #  Ordered By               Order #        Accession #    Episode #   1  JEFFREY Margurite Auerbach           [id number]      [id number]     [id number]   ---------------------------------------------------------------------- Indications   [redacted] weeks gestation of pregnancy  Z3A.31   Previous cesarean delivery, antepartum         O34.219   Poor obstetrical history (previous HTN)        O09.299   Encounter for other antenatal screening        Z36.2   follow-up   Obesity complicating pregnancy, third          O99.213   trimester   Smoking complicating pregnancy, third          O99.333   trimester  ---------------------------------------------------------------------- OB History  Gravidity:    2         Term:   1  Living:       1 ---------------------------------------------------------------------- Fetal Evaluation  Num Of Fetuses:     1  Fetal Heart         132  Rate(bpm):  Cardiac Activity:   Observed  Presentation:       Cephalic  Placenta:           Posterior, above cervical os  P. Cord Insertion:  Previously Visualized  Amniotic Fluid  AFI FV:      Subjectively within normal limits  AFI Sum(cm)     %Tile       Largest Pocket(cm)  12.92           38          6.48  RUQ(cm)       RLQ(cm)       LUQ(cm)        LLQ(cm)  2.26          6.48          2.01           2.17 ---------------------------------------------------------------------- Biometry  BPD:        80  mm     G. Age:  32w 1d         53  %    CI:        73.71   %    70 - 86                                                          FL/HC:      20.6   %    19.1 - 21.3  HC:       296   mm     G. Age:  32w 5d         40  %    HC/AC:      1.04        0.96 - 1.17  AC:      285.8  mm     G. Age:  32w 4d         74  %    FL/BPD:     76.1   %    71 - 87  FL:       60.9  mm     G. Age:  31w 5d         35  %    FL/AC:      21.3   %    20 - 24  HUM:      54.2  mm     G. Age:  31w 4d         47  %  Est. FW:    1941  gm      4 lb 4 oz     67  % ---------------------------------------------------------------------- Gestational Age  LMP:           31w 5d        Date:  10/13/16                 EDD:   07/20/17  U/S  Today:     32w 2d                                        EDD:   07/16/17  Best:          31w 5d     Det. By:  LMP  (10/13/16)          EDD:   07/20/17 ---------------------------------------------------------------------- Anatomy  Cranium:               Previously seen        Aortic Arch:            Previously seen  Cavum:                 Previously seen        Ductal Arch:            Previously seen  Ventricles:            Appears normal         Diaphragm:              Previously seen  Choroid Plexus:        Previously seen        Stomach:                Appears normal, left                                                                        sided  Cerebellum:            Previously seen        Abdomen:                Appears normal  Posterior Fossa:       Previously seen        Abdominal Wall:         Previously seen  Nuchal Fold:           Not applicable (>26    Cord Vessels:           Previously seen                         wks GA)  Face:                  Orbits and profile     Kidneys:                Appear normal                         previously seen  Lips:  Previously seen        Bladder:                Appears normal  Thoracic:              Appears normal         Spine:                  Previously seen  Heart:                 Previously seen        Upper Extremities:      Previously seen  RVOT:                  Previously seen        Lower Extremities:      Previously seen  LVOT:                  Previously seen  Other:  Fetus appears to be a female. Nasal bone previously visualized. Heels          previously visualized. Technically difficult due to maternal habitus and          fetal position. ---------------------------------------------------------------------- Cervix Uterus Adnexa  Cervix  Not visualized (advanced GA >29wks)  Uterus  No abnormality visualized.  Left Ovary  Within normal limits.  Right Ovary  Not visualized.  Adnexa:       No abnormality visualized. No adnexal mass                 visualized. ---------------------------------------------------------------------- Impression  Single living intrauterine pregnancy at [place].  Cephalic presentation.  Placenta Posterior, above cervical os.  Normal amniotic fluid volume.  Appropriate interval fetal growth.  Normal interval fetal anatomy. ---------------------------------------------------------------------- Recommendations  Serial antenatal testing to begin at 32 weeks  Continue serial growth monthly. ----------------------------------------------------------------------               Oralia Rud, MD Electronically Signed Final Report   05/23/2017 10:54 am ----------------------------------------------------------------------   Procedures Procedures (including critical care time)  Medications Ordered in ED Medications  albuterol (PROVENTIL HFA;VENTOLIN HFA) 108 (90 Base) MCG/ACT inhaler 1-2 puff (has no administration in time range)  AEROCHAMBER PLUS FLO-VU MEDIUM MISC 1 each (has no administration in time range)  amoxicillin (AMOXIL) capsule 500 mg (has no administration in time range)  ondansetron (ZOFRAN) injection 4 mg (4 mg Intravenous Given 05/23/17 1445)  sodium chloride 0.9 % bolus 1,000 mL (0 mLs Intravenous Stopped 05/23/17 1606)  ipratropium-albuterol (DUONEB) 0.5-2.5 (3) MG/3ML nebulizer solution 3 mL (3 mLs Nebulization Given 05/23/17 1610)     Initial Impression / Assessment and Plan / ED Course  I have reviewed the triage vital signs and the nursing notes.  Pertinent labs & imaging results that were available during my care of the patient were reviewed by me and considered in my medical decision making (see chart for details).     Pt is feeling better.  She is given an albuterol inhaler prior to d/c.  Pt had an [place] today which showed a single IUP with good fhts.  Pt encouraged to not smoke and return if worse.  She has an appt with her obgyn tomorrow.  Final Clinical Impressions(s) / ED Diagnoses    Final diagnoses:  Acute bronchitis, unspecified organism  Dehydration  Non-intractable vomiting with nausea, unspecified vomiting type  Second hand smoke exposure    ED Discharge Orders  Ordered    amoxicillin (AMOXIL) 500 MG capsule  3 times daily     05/23/17 1618    ondansetron (ZOFRAN ODT) 4 MG disintegrating tablet  Every 8 hours PRN     05/23/17 1618       Isla Pence, MD 05/23/17 1619

## 2017-05-23 NOTE — Discharge Instructions (Signed)
Avoid smoking or exposure to smoke.

## 2017-05-23 NOTE — ED Triage Notes (Signed)
Pt reports she is approximately 8 months pregnant. Pt reports she has been unable to hold anything down since yesterday. Pt reports she has had a cough for approximately a month.  Pt reports no abdominal pain. No diarrhea.

## 2017-05-23 NOTE — ED Notes (Signed)
Per OBGYN RN, call if symptoms change ie involve baby

## 2017-05-24 ENCOUNTER — Ambulatory Visit (INDEPENDENT_AMBULATORY_CARE_PROVIDER_SITE_OTHER): Payer: Medicaid Other | Admitting: Obstetrics & Gynecology

## 2017-05-24 VITALS — BP 115/72 | HR 105 | Wt 242.0 lb

## 2017-05-24 DIAGNOSIS — O10919 Unspecified pre-existing hypertension complicating pregnancy, unspecified trimester: Secondary | ICD-10-CM

## 2017-05-24 DIAGNOSIS — O0990 Supervision of high risk pregnancy, unspecified, unspecified trimester: Secondary | ICD-10-CM

## 2017-05-24 NOTE — Progress Notes (Signed)
Feels better after Tx fpr bronchitis but still has cough   PRENATAL VISIT NOTE  Subjective:  Sally Garner is a 32 y.o. G2P1001 at [redacted]w[redacted]d being seen today for ongoing prenatal care.  She is currently monitored for the following issues for this high-risk pregnancy and has Anemia; Smoker; Fibroid uterus; Obesity, unspecified--BMI 37.1; Hx of primary hypertension--2-3 years ago; Status post primary low transverse cesarean section--FTP, NRFHR; Pleuritic chest pain; Supervision of high risk pregnancy, antepartum; Chronic hypertension during pregnancy, antepartum; Elevated hemoglobin A1c; Unwanted fertility; Pregnancy with poor obstetric history; [redacted] weeks gestation of pregnancy; Obesity affecting pregnancy in third trimester; and HTN in pregnancy, chronic on their problem list.  Patient reports cough.  Contractions: Not present. Vag. Bleeding: None.  Movement: Present. Denies leaking of fluid.   The following portions of the patient's history were reviewed and updated as appropriate: allergies, current medications, past family history, past medical history, past social history, past surgical history and problem list. Problem list updated.  Objective:   Vitals:   05/24/17 0904  BP: 115/72  Pulse: (!) 105  Weight: 242 lb (109.8 kg)    Fetal Status: Fetal Heart Rate (bpm): 132   Movement: Present     General:  Alert, oriented and cooperative. Patient is in no acute distress.  Skin: Skin is warm and dry. No rash noted.   Cardiovascular: Normal heart rate noted  Respiratory: Normal respiratory effort, no problems with respiration noted  Abdomen: Soft, gravid, appropriate for gestational age.  Pain/Pressure: Absent     Pelvic: Cervical exam deferred        Extremities: Normal range of motion.  Edema: Trace  Mental Status: Normal mood and affect. Normal behavior. Normal judgment and thought content.   Assessment and Plan:  Pregnancy: G2P1001 at [redacted]w[redacted]d  1. Supervision of high risk pregnancy,  antepartum Nl BP ans fetal growth   2. Chronic hypertension during pregnancy, antepartum No medication  Preterm labor symptoms and general obstetric precautions including but not limited to vaginal bleeding, contractions, leaking of fluid and fetal movement were reviewed in detail with the patient. Please refer to After Visit Summary for other counseling recommendations.  Return in about 2 weeks (around 06/07/2017).  Future Appointments  Date Time Provider [place]  05/30/2017  4:00 PM [place] 3 WH-MFCUS MFC-US  06/06/2017  9:45 AM WH-MFC [place] 2 WH-MFCUS MFC-US  06/07/2017  9:15 AM Chancy Milroy, MD [place] None  06/13/2017  4:00 PM [place] 3 WH-MFCUS MFC-US  06/20/2017  9:30 AM WH-MFC [place] 1 WH-MFCUS MFC-US  06/27/2017  9:30 AM WH-MFC [place] 5 WH-MFCUS MFC-US  07/04/2017  9:30 AM WH-MFC [place] 1 WH-MFCUS MFC-US    Emeterio Reeve, MD

## 2017-05-24 NOTE — Progress Notes (Signed)
Pt recently dx w/ Bronchitis

## 2017-05-30 ENCOUNTER — Ambulatory Visit (HOSPITAL_COMMUNITY)
Admission: RE | Admit: 2017-05-30 | Discharge: 2017-05-30 | Disposition: A | Discharge status: Home / Self Care | Payer: Medicaid Other | Source: Ambulatory Visit | Attending: Certified Nurse Midwife | Admitting: Certified Nurse Midwife

## 2017-05-30 ENCOUNTER — Encounter (HOSPITAL_COMMUNITY): Payer: Self-pay

## 2017-05-30 ENCOUNTER — Other Ambulatory Visit (HOSPITAL_COMMUNITY): Payer: Self-pay | Admitting: Obstetrics and Gynecology

## 2017-05-30 DIAGNOSIS — O10013 Pre-existing essential hypertension complicating pregnancy, third trimester: Secondary | ICD-10-CM | POA: Insufficient documentation

## 2017-05-30 DIAGNOSIS — O34219 Maternal care for unspecified type scar from previous cesarean delivery: Secondary | ICD-10-CM | POA: Diagnosis not present

## 2017-05-30 DIAGNOSIS — Z3A32 32 weeks gestation of pregnancy: Secondary | ICD-10-CM | POA: Insufficient documentation

## 2017-05-30 DIAGNOSIS — O99333 Smoking (tobacco) complicating pregnancy, third trimester: Secondary | ICD-10-CM | POA: Diagnosis not present

## 2017-05-30 DIAGNOSIS — O09293 Supervision of pregnancy with other poor reproductive or obstetric history, third trimester: Secondary | ICD-10-CM | POA: Insufficient documentation

## 2017-05-30 DIAGNOSIS — O99213 Obesity complicating pregnancy, third trimester: Secondary | ICD-10-CM | POA: Diagnosis not present

## 2017-05-30 DIAGNOSIS — O10919 Unspecified pre-existing hypertension complicating pregnancy, unspecified trimester: Secondary | ICD-10-CM

## 2017-05-30 DIAGNOSIS — Z3009 Encounter for other general counseling and advice on contraception: Secondary | ICD-10-CM

## 2017-06-06 ENCOUNTER — Encounter (HOSPITAL_COMMUNITY): Payer: Self-pay

## 2017-06-06 ENCOUNTER — Ambulatory Visit (HOSPITAL_COMMUNITY)
Admission: RE | Admit: 2017-06-06 | Discharge: 2017-06-06 | Disposition: A | Discharge status: Home / Self Care | Payer: Medicaid Other | Source: Ambulatory Visit | Attending: Certified Nurse Midwife | Admitting: Certified Nurse Midwife

## 2017-06-06 DIAGNOSIS — O34219 Maternal care for unspecified type scar from previous cesarean delivery: Secondary | ICD-10-CM | POA: Insufficient documentation

## 2017-06-06 DIAGNOSIS — Z3A33 33 weeks gestation of pregnancy: Secondary | ICD-10-CM | POA: Insufficient documentation

## 2017-06-06 DIAGNOSIS — O09293 Supervision of pregnancy with other poor reproductive or obstetric history, third trimester: Secondary | ICD-10-CM | POA: Insufficient documentation

## 2017-06-06 DIAGNOSIS — F17200 Nicotine dependence, unspecified, uncomplicated: Secondary | ICD-10-CM | POA: Insufficient documentation

## 2017-06-06 DIAGNOSIS — O99213 Obesity complicating pregnancy, third trimester: Secondary | ICD-10-CM | POA: Diagnosis not present

## 2017-06-06 DIAGNOSIS — O99333 Smoking (tobacco) complicating pregnancy, third trimester: Secondary | ICD-10-CM | POA: Diagnosis not present

## 2017-06-06 DIAGNOSIS — O10013 Pre-existing essential hypertension complicating pregnancy, third trimester: Secondary | ICD-10-CM | POA: Diagnosis present

## 2017-06-06 DIAGNOSIS — O10919 Unspecified pre-existing hypertension complicating pregnancy, unspecified trimester: Secondary | ICD-10-CM

## 2017-06-07 ENCOUNTER — Ambulatory Visit (INDEPENDENT_AMBULATORY_CARE_PROVIDER_SITE_OTHER): Payer: Medicaid Other | Admitting: Obstetrics and Gynecology

## 2017-06-07 ENCOUNTER — Encounter: Payer: Self-pay | Admitting: Obstetrics and Gynecology

## 2017-06-07 VITALS — BP 122/80 | HR 111 | Wt 243.0 lb

## 2017-06-07 DIAGNOSIS — Z3009 Encounter for other general counseling and advice on contraception: Secondary | ICD-10-CM

## 2017-06-07 DIAGNOSIS — Z3483 Encounter for supervision of other normal pregnancy, third trimester: Secondary | ICD-10-CM

## 2017-06-07 DIAGNOSIS — O0990 Supervision of high risk pregnancy, unspecified, unspecified trimester: Secondary | ICD-10-CM

## 2017-06-07 DIAGNOSIS — Z98891 History of uterine scar from previous surgery: Secondary | ICD-10-CM

## 2017-06-07 DIAGNOSIS — O0993 Supervision of high risk pregnancy, unspecified, third trimester: Secondary | ICD-10-CM

## 2017-06-07 DIAGNOSIS — O10919 Unspecified pre-existing hypertension complicating pregnancy, unspecified trimester: Secondary | ICD-10-CM

## 2017-06-07 DIAGNOSIS — O10913 Unspecified pre-existing hypertension complicating pregnancy, third trimester: Secondary | ICD-10-CM

## 2017-06-07 NOTE — Progress Notes (Signed)
Subjective:  Sally Garner is a 32 y.o. G2P1001 at [redacted]w[redacted]d being seen today for ongoing prenatal care.  She is currently monitored for the following issues for this high-risk pregnancy and has Anemia; Smoker; Fibroid uterus; Obesity, unspecified--BMI 37.1; Hx of primary hypertension--2-3 years ago; Status post primary low transverse cesarean section--FTP, NRFHR; Pleuritic chest pain; Supervision of high risk pregnancy, antepartum; Chronic hypertension during pregnancy, antepartum; Elevated hemoglobin A1c; Unwanted fertility; Pregnancy with poor obstetric history; Obesity affecting pregnancy in third trimester; and HTN in pregnancy, chronic on their problem list.  Patient reports general discomforts of pregnancy.  Contractions: Not present. Vag. Bleeding: None.  Movement: Present. Denies leaking of fluid.   The following portions of the patient's history were reviewed and updated as appropriate: allergies, current medications, past family history, past medical history, past social history, past surgical history and problem list. Problem list updated.  Objective:   Vitals:   06/07/17 0926  BP: 122/80  Pulse: (!) 111  Weight: 243 lb (110.2 kg)    Fetal Status:     Movement: Present     General:  Alert, oriented and cooperative. Patient is in no acute distress.  Skin: Skin is warm and dry. No rash noted.   Cardiovascular: Normal heart rate noted  Respiratory: Normal respiratory effort, no problems with respiration noted  Abdomen: Soft, gravid, appropriate for gestational age. Pain/Pressure: Present     Pelvic:  Cervical exam deferred        Extremities: Normal range of motion.  Edema: Trace  Mental Status: Normal mood and affect. Normal behavior. Normal judgment and thought content.   Urinalysis:      Assessment and Plan:  Pregnancy: G2P1001 at [redacted]w[redacted]d  1. Supervision of high risk pregnancy, antepartum Stable  2. Unwanted fertility Papers signed 04/12/17  3. Status post primary low  transverse cesarean section--FTP, NRFHR Desires TOLAC  4. Chronic hypertension during pregnancy, antepartum BPP 8/8 yesterday, continue with weekly testing No meds, continue with BASA No S/Sx of PEC  Preterm labor symptoms and general obstetric precautions including but not limited to vaginal bleeding, contractions, leaking of fluid and fetal movement were reviewed in detail with the patient. Please refer to After Visit Summary for other counseling recommendations.  Return in about 1 week (around 06/14/2017) for OB visit.   Chancy Milroy, MD

## 2017-06-07 NOTE — Progress Notes (Signed)
Pt has no concerns today 

## 2017-06-13 ENCOUNTER — Inpatient Hospital Stay (HOSPITAL_COMMUNITY)
Admission: AD | Admit: 2017-06-13 | Discharge: 2017-06-13 | Disposition: A | Discharge status: Home / Self Care | Payer: Medicaid Other | Source: Ambulatory Visit | Attending: Obstetrics & Gynecology | Admitting: Obstetrics & Gynecology

## 2017-06-13 ENCOUNTER — Other Ambulatory Visit (HOSPITAL_COMMUNITY): Payer: Self-pay | Admitting: Obstetrics and Gynecology

## 2017-06-13 ENCOUNTER — Encounter (HOSPITAL_COMMUNITY): Payer: Self-pay

## 2017-06-13 ENCOUNTER — Ambulatory Visit (HOSPITAL_COMMUNITY)
Admission: RE | Admit: 2017-06-13 | Discharge: 2017-06-13 | Disposition: A | Discharge status: Home / Self Care | Payer: Medicaid Other | Source: Ambulatory Visit | Attending: Obstetrics and Gynecology | Admitting: Obstetrics and Gynecology

## 2017-06-13 DIAGNOSIS — O99213 Obesity complicating pregnancy, third trimester: Secondary | ICD-10-CM | POA: Insufficient documentation

## 2017-06-13 DIAGNOSIS — O10913 Unspecified pre-existing hypertension complicating pregnancy, third trimester: Secondary | ICD-10-CM | POA: Diagnosis not present

## 2017-06-13 DIAGNOSIS — Z3A34 34 weeks gestation of pregnancy: Secondary | ICD-10-CM | POA: Diagnosis not present

## 2017-06-13 DIAGNOSIS — O09293 Supervision of pregnancy with other poor reproductive or obstetric history, third trimester: Secondary | ICD-10-CM | POA: Insufficient documentation

## 2017-06-13 DIAGNOSIS — Z3009 Encounter for other general counseling and advice on contraception: Secondary | ICD-10-CM

## 2017-06-13 DIAGNOSIS — Z3689 Encounter for other specified antenatal screening: Secondary | ICD-10-CM

## 2017-06-13 DIAGNOSIS — O34219 Maternal care for unspecified type scar from previous cesarean delivery: Secondary | ICD-10-CM | POA: Insufficient documentation

## 2017-06-13 DIAGNOSIS — O10919 Unspecified pre-existing hypertension complicating pregnancy, unspecified trimester: Secondary | ICD-10-CM

## 2017-06-13 DIAGNOSIS — O10113 Pre-existing hypertensive heart disease complicating pregnancy, third trimester: Secondary | ICD-10-CM | POA: Insufficient documentation

## 2017-06-13 DIAGNOSIS — O99333 Smoking (tobacco) complicating pregnancy, third trimester: Secondary | ICD-10-CM | POA: Insufficient documentation

## 2017-06-13 DIAGNOSIS — O10019 Pre-existing essential hypertension complicating pregnancy, unspecified trimester: Secondary | ICD-10-CM | POA: Diagnosis present

## 2017-06-13 NOTE — MAU Note (Signed)
Urine sent to lab 

## 2017-06-13 NOTE — MAU Note (Signed)
Sent from MFM for non reactive NST. BPP 6/8

## 2017-06-13 NOTE — MAU Provider Note (Signed)
History     CSN: [id number]  Arrival date and time: 06/13/17 1642   First Provider Initiated Contact with Patient 06/13/17 1727      Chief Complaint  Patient presents with  . Non-stress Test   HPI  [name] is a 32 y.o. G2P1001 at [redacted]w[redacted]d who presents to MAU today from MFM for NST after 6/8 BPP. BPP was performed routine for [facility]. Patient denies vaginal bleeding, LOF or regular contractions. She reports +FM.   OB History    Gravida  2   Para  1   Term  1   Preterm      AB      Living  1     SAB      TAB      Ectopic      Multiple      Live Births  1           Past Medical History:  Diagnosis Date  . Hypertension    was put on medication end of 03/2015; states has caused headache and is not taking as prescribed  . Migraines   . Symptomatic cholelithiasis 04/2015    Past Surgical History:  Procedure Laterality Date  . CESAREAN SECTION N/A 05/25/2013   Procedure: CESAREAN SECTION;  Surgeon: Annalee Genta, DO;  Location: [facility];  Service: Obstetrics;  Laterality: N/A;  . CHOLECYSTECTOMY N/A 05/05/2015   Procedure: LAPAROSCOPIC CHOLECYSTECTOMY;  Surgeon: Coralie Keens, MD;  Location: [facility];  Service: General;  Laterality: N/A;    Family History  Problem Relation Age of Onset  . Hypertension Maternal Aunt   . Hypertension Maternal Uncle   . Birth defects Brother        polydactyl  . Cancer Paternal Aunt        breast cancer    Social History   Tobacco Use  . Smoking status: Current Some Day Smoker    Packs/day: 0.50    Years: 3.00    Pack years: 1.50    Types: Cigarettes  . Smokeless tobacco: Never Used  Substance Use Topics  . Alcohol use: Yes    Comment: occasional  . Drug use: No    Allergies: No Known Allergies  No medications prior to admission.    Review of Systems  Constitutional: Negative for fever.  Gastrointestinal: Negative for abdominal pain.  Genitourinary: Negative for vaginal bleeding  and vaginal discharge.   Physical Exam   Blood pressure 124/75, pulse 91, temperature 98.4 F (36.9 C), temperature source Oral, resp. rate 18, height 5\' 4"  (1.626 m), weight 239 lb (108.4 kg), last menstrual period 10/13/2016, SpO2 100 %.  Physical Exam  Nursing note and vitals reviewed. Constitutional: She is oriented to person, place, and time. She appears well-developed and well-nourished. No distress.  HENT:  Head: Normocephalic and atraumatic.  Cardiovascular: Normal rate.  Respiratory: Effort normal.  GI: Soft. She exhibits no distension.  Neurological: She is alert and oriented to person, place, and time.  Skin: Skin is warm and dry. No erythema.  Psychiatric: She has a normal mood and affect.   Fetal Monitoring: Baseline: 130 bpm Variability: moderate Accelerations: 15 x 15 Decelerations: none Contractions: none   MAU Course  Procedures None  MDM Sent from MFM 6/8 BPP Per MFM, if NST reactive patient may be discharge and has follow-up scheduled Assessment and Plan  A: SIUP at [redacted]w[redacted]d 6/8 BPP  Reactive NST   P: Discharge home Preterm labor precautions and kick counts  discussed Patient advised to follow-up with [facility] as scheduled for routine prenatal care Patient may return to MAU as needed or if her condition were to change or worsen   Kerry Hough, PA-C 06/13/2017, 6:33 PM

## 2017-06-13 NOTE — Discharge Instructions (Signed)
Braxton Hicks Contractions °Contractions of the uterus can occur throughout pregnancy, but they are not always a sign that you are in labor. You may have practice contractions called Braxton Hicks contractions. These false labor contractions are sometimes confused with true labor. °What are Braxton Hicks contractions? °Braxton Hicks contractions are tightening movements that occur in the muscles of the uterus before labor. Unlike true labor contractions, these contractions do not result in opening (dilation) and thinning of the cervix. Toward the end of pregnancy (32-34 weeks), Braxton Hicks contractions can happen more often and may become stronger. These contractions are sometimes difficult to tell apart from true labor because they can be very uncomfortable. You should not feel embarrassed if you go to the hospital with false labor. °Sometimes, the only way to tell if you are in true labor is for your health care provider to look for changes in the cervix. The health care provider will do a physical exam and may monitor your contractions. If you are not in true labor, the exam should show that your cervix is not dilating and your water has not broken. °If there are other health problems associated with your pregnancy, it is completely safe for you to be sent home with false labor. You may continue to have Braxton Hicks contractions until you go into true labor. °How to tell the difference between true labor and false labor °True labor °· Contractions last 30-70 seconds. °· Contractions become very regular. °· Discomfort is usually felt in the top of the uterus, and it spreads to the lower abdomen and low back. °· Contractions do not go away with walking. °· Contractions usually become more intense and increase in frequency. °· The cervix dilates and gets thinner. °False labor °· Contractions are usually shorter and not as strong as true labor contractions. °· Contractions are usually irregular. °· Contractions  are often felt in the front of the lower abdomen and in the groin. °· Contractions may go away when you walk around or change positions while lying down. °· Contractions get weaker and are shorter-lasting as time goes on. °· The cervix usually does not dilate or become thin. °Follow these instructions at home: °· Take over-the-counter and prescription medicines only as told by your health care provider. °· Keep up with your usual exercises and follow other instructions from your health care provider. °· Eat and drink lightly if you think you are going into labor. °· If Braxton Hicks contractions are making you uncomfortable: °? Change your position from lying down or resting to walking, or change from walking to resting. °? Sit and rest in a tub of warm water. °? Drink enough fluid to keep your urine pale yellow. Dehydration may cause these contractions. °? Do slow and deep breathing several times an hour. °· Keep all follow-up prenatal visits as told by your health care provider. This is important. °Contact a health care provider if: °· You have a fever. °· You have continuous pain in your abdomen. °Get help right away if: °· Your contractions become stronger, more regular, and closer together. °· You have fluid leaking or gushing from your vagina. °· You pass blood-tinged mucus (bloody show). °· You have bleeding from your vagina. °· You have low back pain that you never had before. °· You feel your baby’s head pushing down and causing pelvic pressure. °· Your baby is not moving inside you as much as it used to. °Summary °· Contractions that occur before labor are called Braxton   Hicks contractions, false labor, or practice contractions. °· Braxton Hicks contractions are usually shorter, weaker, farther apart, and less regular than true labor contractions. True labor contractions usually become progressively stronger and regular and they become more frequent. °· Manage discomfort from Braxton Hicks contractions by  changing position, resting in a warm bath, drinking plenty of water, or practicing deep breathing. °This information is not intended to replace advice given to you by your health care provider. Make sure you discuss any questions you have with your health care provider. °Document Released: 06/23/2016 Document Revised: 06/23/2016 Document Reviewed: 06/23/2016 °Elsevier Interactive Patient Education © 2018 [employer] ° °Fetal Movement Counts °Patient Name: ________________________________________________ Patient Due Date: ____________________ °What is a fetal movement count? °A fetal movement count is the number of times that you feel your baby move during a certain amount of time. This may also be called a fetal kick count. A fetal movement count is recommended for every pregnant woman. You may be asked to start counting fetal movements as early as week 28 of your pregnancy. °Pay attention to when your baby is most active. You may notice your baby's sleep and wake cycles. You may also notice things that make your baby move more. You should do a fetal movement count: °· When your baby is normally most active. °· At the same time each day. ° °A good time to count movements is while you are resting, after having something to eat and drink. °How do I count fetal movements? °1. Find a quiet, comfortable area. Sit, or lie down on your side. °2. Write down the date, the start time and stop time, and the number of movements that you felt between those two times. Take this information with you to your health care visits. °3. For 2 hours, count kicks, flutters, swishes, rolls, and jabs. You should feel at least 10 movements during 2 hours. °4. You may stop counting after you have felt 10 movements. °5. If you do not feel 10 movements in 2 hours, have something to eat and drink. Then, keep resting and counting for 1 hour. If you feel at least 4 movements during that hour, you may stop counting. °Contact a health care  provider if: °· You feel fewer than 4 movements in 2 hours. °· Your baby is not moving like he or she usually does. °Date: ____________ Start time: ____________ Stop time: ____________ Movements: ____________ °Date: ____________ Start time: ____________ Stop time: ____________ Movements: ____________ °Date: ____________ Start time: ____________ Stop time: ____________ Movements: ____________ °Date: ____________ Start time: ____________ Stop time: ____________ Movements: ____________ °Date: ____________ Start time: ____________ Stop time: ____________ Movements: ____________ °Date: ____________ Start time: ____________ Stop time: ____________ Movements: ____________ °Date: ____________ Start time: ____________ Stop time: ____________ Movements: ____________ °Date: ____________ Start time: ____________ Stop time: ____________ Movements: ____________ °Date: ____________ Start time: ____________ Stop time: ____________ Movements: ____________ °This information is not intended to replace advice given to you by your health care provider. Make sure you discuss any questions you have with your health care provider. °Document Released: 03/09/2006 Document Revised: 10/07/2015 Document Reviewed: 03/19/2015 °Elsevier Interactive Patient Education © 2018 [employer] ° °

## 2017-06-14 ENCOUNTER — Ambulatory Visit (INDEPENDENT_AMBULATORY_CARE_PROVIDER_SITE_OTHER): Payer: Medicaid Other | Admitting: Obstetrics & Gynecology

## 2017-06-14 VITALS — BP 118/75 | HR 109 | Temp 99.5°F | Wt 237.7 lb

## 2017-06-14 DIAGNOSIS — O09299 Supervision of pregnancy with other poor reproductive or obstetric history, unspecified trimester: Secondary | ICD-10-CM

## 2017-06-14 DIAGNOSIS — D259 Leiomyoma of uterus, unspecified: Secondary | ICD-10-CM

## 2017-06-14 DIAGNOSIS — O10919 Unspecified pre-existing hypertension complicating pregnancy, unspecified trimester: Secondary | ICD-10-CM

## 2017-06-14 DIAGNOSIS — O10913 Unspecified pre-existing hypertension complicating pregnancy, third trimester: Secondary | ICD-10-CM

## 2017-06-14 DIAGNOSIS — O0993 Supervision of high risk pregnancy, unspecified, third trimester: Secondary | ICD-10-CM

## 2017-06-14 DIAGNOSIS — Z98891 History of uterine scar from previous surgery: Secondary | ICD-10-CM

## 2017-06-14 DIAGNOSIS — Z3009 Encounter for other general counseling and advice on contraception: Secondary | ICD-10-CM

## 2017-06-14 DIAGNOSIS — F17200 Nicotine dependence, unspecified, uncomplicated: Secondary | ICD-10-CM

## 2017-06-14 DIAGNOSIS — O0990 Supervision of high risk pregnancy, unspecified, unspecified trimester: Secondary | ICD-10-CM

## 2017-06-14 DIAGNOSIS — R7309 Other abnormal glucose: Secondary | ICD-10-CM

## 2017-06-14 DIAGNOSIS — O09293 Supervision of pregnancy with other poor reproductive or obstetric history, third trimester: Secondary | ICD-10-CM

## 2017-06-14 DIAGNOSIS — O99213 Obesity complicating pregnancy, third trimester: Secondary | ICD-10-CM

## 2017-06-14 NOTE — Progress Notes (Signed)
   PRENATAL VISIT NOTE  Subjective:  Sally Garner is a 32 y.o. G2P1001 at [redacted]w[redacted]d being seen today for ongoing prenatal care.  She is currently monitored for the following issues for this high-risk pregnancy and has Anemia; Smoker; Fibroid uterus; Obesity, unspecified--BMI 37.1; Hx of primary hypertension--2-3 years ago; Status post primary low transverse cesarean section--FTP, NRFHR; Pleuritic chest pain; Supervision of high risk pregnancy, antepartum; Chronic hypertension during pregnancy, antepartum; Elevated hemoglobin A1c; Unwanted fertility; Pregnancy with poor obstetric history; Obesity affecting pregnancy in third trimester; and HTN in pregnancy, chronic on their problem list.  Patient reports no complaints.  Contractions: Not present. Vag. Bleeding: None.  Movement: Present. Denies leaking of fluid.   The following portions of the patient's history were reviewed and updated as appropriate: allergies, current medications, past family history, past medical history, past social history, past surgical history and problem list. Problem list updated.  Objective:   Vitals:   06/14/17 0858  BP: 118/75  Pulse: (!) 109  Temp: 99.5 F (37.5 C)  Weight: 237 lb 11.2 oz (107.8 kg)    Fetal Status:     Movement: Present     General:  Alert, oriented and cooperative. Patient is in no acute distress.  Skin: Skin is warm and dry. No rash noted.   Cardiovascular: Normal heart rate noted  Respiratory: Normal respiratory effort, no problems with respiration noted  Abdomen: Soft, gravid, appropriate for gestational age.  Pain/Pressure: Present     Pelvic: Cervical exam deferred        Extremities: Normal range of motion.  Edema: Trace  Mental Status: Normal mood and affect. Normal behavior. Normal judgment and thought content.   Assessment and Plan:  Pregnancy: G2P1001 at [redacted]w[redacted]d  1. Supervision of high risk pregnancy, antepartum NST reviewed and reactive.  2. Unwanted fertility Title XIX  papers signed an don chart under media  3. Status post primary low transverse cesarean section--FTP, NRFHR VBAC consent on chart 12/29/2016  4. Smoker  5. Pregnancy with poor obstetric history  6. Obesity affecting pregnancy in third trimester  7. Uterine leiomyoma, unspecified location  8. Elevated hemoglobin A1c No meds   9. Chronic hypertension during pregnancy, antepartum Controlled no meds Begin antenatal testing Keep baby ASA  Preterm labor symptoms and general obstetric precautions including but not limited to vaginal bleeding, contractions, leaking of fluid and fetal movement were reviewed in detail with the patient. Please refer to After Visit Summary for other counseling recommendations.  Return in about 2 weeks (around 06/28/2017).  Future Appointments  Date Time Provider [place]  06/20/2017  9:30 AM [facility] [place] 1 WH-MFCUS MFC-US  06/27/2017  9:30 AM [facility]FC [place] 5 WH-MFCUS MFC-US  07/04/2017  9:30 AM [facility] [place] 1 WH-MFCUS MFC-US    Lavonia Drafts, MD

## 2017-06-20 ENCOUNTER — Encounter (HOSPITAL_COMMUNITY): Payer: Self-pay

## 2017-06-20 ENCOUNTER — Other Ambulatory Visit (HOSPITAL_COMMUNITY)
Admission: RE | Admit: 2017-06-20 | Discharge: 2017-06-20 | Disposition: A | Discharge status: Home / Self Care | Payer: Medicaid Other | Source: Ambulatory Visit | Attending: Obstetrics & Gynecology | Admitting: Obstetrics & Gynecology

## 2017-06-20 ENCOUNTER — Ambulatory Visit (HOSPITAL_COMMUNITY)
Admission: RE | Admit: 2017-06-20 | Discharge: 2017-06-20 | Disposition: A | Discharge status: Home / Self Care | Payer: Medicaid Other | Source: Ambulatory Visit | Attending: Certified Nurse Midwife | Admitting: Certified Nurse Midwife

## 2017-06-20 ENCOUNTER — Ambulatory Visit (INDEPENDENT_AMBULATORY_CARE_PROVIDER_SITE_OTHER): Payer: Medicaid Other | Admitting: Obstetrics & Gynecology

## 2017-06-20 ENCOUNTER — Other Ambulatory Visit (HOSPITAL_COMMUNITY): Payer: Self-pay | Admitting: Obstetrics and Gynecology

## 2017-06-20 VITALS — BP 112/80 | HR 112 | Wt 241.0 lb

## 2017-06-20 DIAGNOSIS — Z98891 History of uterine scar from previous surgery: Secondary | ICD-10-CM

## 2017-06-20 DIAGNOSIS — O99213 Obesity complicating pregnancy, third trimester: Secondary | ICD-10-CM

## 2017-06-20 DIAGNOSIS — O0990 Supervision of high risk pregnancy, unspecified, unspecified trimester: Secondary | ICD-10-CM | POA: Insufficient documentation

## 2017-06-20 DIAGNOSIS — O09299 Supervision of pregnancy with other poor reproductive or obstetric history, unspecified trimester: Secondary | ICD-10-CM

## 2017-06-20 DIAGNOSIS — O34219 Maternal care for unspecified type scar from previous cesarean delivery: Secondary | ICD-10-CM | POA: Insufficient documentation

## 2017-06-20 DIAGNOSIS — O10013 Pre-existing essential hypertension complicating pregnancy, third trimester: Secondary | ICD-10-CM | POA: Diagnosis not present

## 2017-06-20 DIAGNOSIS — O09293 Supervision of pregnancy with other poor reproductive or obstetric history, third trimester: Secondary | ICD-10-CM | POA: Diagnosis not present

## 2017-06-20 DIAGNOSIS — O99333 Smoking (tobacco) complicating pregnancy, third trimester: Secondary | ICD-10-CM | POA: Insufficient documentation

## 2017-06-20 DIAGNOSIS — O10919 Unspecified pre-existing hypertension complicating pregnancy, unspecified trimester: Secondary | ICD-10-CM

## 2017-06-20 DIAGNOSIS — Z3A35 35 weeks gestation of pregnancy: Secondary | ICD-10-CM

## 2017-06-20 LAB — OB RESULTS CONSOLE GBS: STREP GROUP B AG: NEGATIVE

## 2017-06-20 NOTE — Progress Notes (Signed)
ROB/GBS/NST. C/o waking up with a headache 7/10 x 3 days. Right buttocks pain.

## 2017-06-20 NOTE — Patient Instructions (Signed)
Vaginal Birth After Cesarean Delivery Vaginal birth after cesarean delivery (VBAC) is giving birth vaginally after previously delivering a baby by a cesarean. In the past, if a woman had a cesarean delivery, all births afterward would be done by cesarean delivery. This is no longer true. It can be safe for the mother to try a vaginal delivery after having a cesarean delivery. It is important to discuss VBAC with your health care provider early in the pregnancy so you can understand the risks, benefits, and options. It will give you time to decide what is best in your particular case. The final decision about whether to have a VBAC or repeat cesarean delivery should be between you and your health care provider. Any changes in your health or your baby's health during your pregnancy may make it necessary to change your initial decision about VBAC. Women who plan to have a VBAC should check with their health care provider to be sure that:  The previous cesarean delivery was done with a low transverse uterine cut (incision) (not a vertical classical incision).  The birth canal is big enough for the baby.  There were no other operations on the uterus.  An electronic fetal monitor (EFM) will be on at all times during labor.  An operating room will be available and ready in case an emergency cesarean delivery is needed.  A health care provider and surgical nursing staff will be available at all times during labor to be ready to do an emergency delivery cesarean if necessary.  An anesthesiologist will be present in case an emergency cesarean delivery is needed.  The nursery is prepared and has adequate personnel and necessary equipment available to care for the baby in case of an emergency cesarean delivery. Benefits of VBAC  Shorter stay in the hospital.  Avoidance of risks associated with cesarean delivery, such as: ? Surgical complications, such as opening of the incision or hernia in the  incision. ? Injury to other organs. ? Fever. This can occur if an infection develops after surgery. It can also occur as a reaction to the medicine given to make you numb during the surgery.  Less blood loss and need for blood transfusions.  Lower risk of blood clots and infection.  Shorter recovery.  Decreased risk for having to remove the uterus (hysterectomy).  Decreased risk for the placenta to completely or partially cover the opening of the uterus (placenta previa) with a future pregnancy.  Decrease risk in future labor and delivery. Risks of a VBAC  Tearing (rupture) of the uterus. This is occurs in less than 1% of VBACs. The risk of this happening is higher if: ? Steps are taken to begin the labor process (induce labor) or stimulate or strengthen contractions (augment labor). ? Medicine is used to soften (ripen) the cervix.  Having to remove the uterus (hysterectomy) if it ruptures. VBAC should not be done if:  The previous cesarean delivery was done with a vertical (classical) or T-shaped incision or you do not know what kind of incision was made.  You had a ruptured uterus.  You have had certain types of surgery on your uterus, such as removal of uterine fibroids. Ask your health care provider about other types of surgeries that prevent you from having a VBAC.  You have certain medical or childbirth (obstetrical) problems.  There are problems with the baby.  You have had two previous cesarean deliveries and no vaginal deliveries. Other facts to know about VBAC:  It   is safe to have an epidural anesthetic with VBAC.  It is safe to turn the baby from a breech position (attempt an external cephalic version).  It is safe to try a VBAC with twins.  VBAC may not be successful if your baby weights 8.8 lb (4 kg) or more. However, weight predictions are not always accurate and should not be used alone to decide if VBAC is right for you.  There is an increased failure rate  if the time between the cesarean delivery and VBAC is less than 19 months.  Your health care provider may advise against a VBAC if you have preeclampsia (high blood pressure, protein in the urine, and swelling of face and extremities).  VBAC is often successful if you previously gave birth vaginally.  VBAC is often successful when the labor starts spontaneously before the due date.  Delivering a baby through a VBAC is similar to having a normal spontaneous vaginal delivery. This information is not intended to replace advice given to you by your health care provider. Make sure you discuss any questions you have with your health care provider. Document Released: 07/31/2006 Document Revised: 07/16/2015 Document Reviewed: 09/06/2012 Elsevier Interactive Patient Education  2018 Elsevier Inc.  

## 2017-06-20 NOTE — Progress Notes (Signed)
   PRENATAL VISIT NOTE  Subjective:  Sally Garner is a 32 y.o. G2P1001 at [redacted]w[redacted]d being seen today for ongoing prenatal care.  She is currently monitored for the following issues for this high-risk pregnancy and has Anemia; Smoker; Fibroid uterus; Obesity, unspecified--BMI 37.1; Hx of primary hypertension--2-3 years ago; Status post primary low transverse cesarean section--FTP, NRFHR; Pleuritic chest pain; Supervision of high risk pregnancy, antepartum; Chronic hypertension during pregnancy, antepartum; Elevated hemoglobin A1c; Unwanted fertility; Pregnancy with poor obstetric history; Obesity affecting pregnancy in third trimester; and HTN in pregnancy, chronic on their problem list.  Patient reports no complaints.  Contractions: Not present. Vag. Bleeding: None.  Movement: Present. Denies leaking of fluid.   The following portions of the patient's history were reviewed and updated as appropriate: allergies, current medications, past family history, past medical history, past social history, past surgical history and problem list. Problem list updated.  Objective:   Vitals:   06/20/17 1401  BP: 112/80  Pulse: (!) 112  Weight: 241 lb (109.3 kg)    Fetal Status:     Movement: Present     General:  Alert, oriented and cooperative. Patient is in no acute distress.  Skin: Skin is warm and dry. No rash noted.   Cardiovascular: Normal heart rate noted  Respiratory: Normal respiratory effort, no problems with respiration noted  Abdomen: Soft, gravid, appropriate for gestational age.  Pain/Pressure: Present     Pelvic: Cervical exam performed        Extremities: Normal range of motion.  Edema: Trace  Mental Status: Normal mood and affect. Normal behavior. Normal judgment and thought content.   Assessment and Plan:  Pregnancy: G2P1001 at [redacted]w[redacted]d  There are no diagnoses linked to this encounter. Preterm labor symptoms and general obstetric precautions including but not limited to vaginal  bleeding, contractions, leaking of fluid and fetal movement were reviewed in detail with the patient. Please refer to After Visit Summary for other counseling recommendations.  No follow-ups on file.  Future Appointments  Date Time Provider [place]  06/27/2017  9:30 AM [place] 5 [facility]-M[facility]US MFC-US  06/28/2017  8:45 AM [place] Mode, MD [facility] None  07/04/2017  9:30 AM [facility] [place] 1 WH-MFCUS MFC-US    Emeterio Reeve, MD

## 2017-06-21 LAB — CERVICOVAGINAL ANCILLARY ONLY
CHLAMYDIA, DNA PROBE: NEGATIVE
Neisseria Gonorrhea: NEGATIVE

## 2017-06-22 LAB — STREP GP B NAA: STREP GROUP B AG: NEGATIVE

## 2017-06-27 ENCOUNTER — Encounter (HOSPITAL_COMMUNITY): Payer: Self-pay

## 2017-06-27 ENCOUNTER — Other Ambulatory Visit (HOSPITAL_COMMUNITY): Payer: Self-pay | Admitting: Obstetrics and Gynecology

## 2017-06-27 ENCOUNTER — Ambulatory Visit (HOSPITAL_COMMUNITY)
Admission: RE | Admit: 2017-06-27 | Discharge: 2017-06-27 | Disposition: A | Discharge status: Home / Self Care | Payer: Medicaid Other | Source: Ambulatory Visit | Attending: Certified Nurse Midwife | Admitting: Certified Nurse Midwife

## 2017-06-27 DIAGNOSIS — O99213 Obesity complicating pregnancy, third trimester: Secondary | ICD-10-CM | POA: Diagnosis not present

## 2017-06-27 DIAGNOSIS — Z8759 Personal history of other complications of pregnancy, childbirth and the puerperium: Secondary | ICD-10-CM

## 2017-06-27 DIAGNOSIS — O34219 Maternal care for unspecified type scar from previous cesarean delivery: Secondary | ICD-10-CM

## 2017-06-27 DIAGNOSIS — Z3A36 36 weeks gestation of pregnancy: Secondary | ICD-10-CM

## 2017-06-27 DIAGNOSIS — O10913 Unspecified pre-existing hypertension complicating pregnancy, third trimester: Secondary | ICD-10-CM

## 2017-06-27 DIAGNOSIS — O10919 Unspecified pre-existing hypertension complicating pregnancy, unspecified trimester: Secondary | ICD-10-CM

## 2017-06-27 DIAGNOSIS — O10013 Pre-existing essential hypertension complicating pregnancy, third trimester: Secondary | ICD-10-CM | POA: Diagnosis not present

## 2017-06-27 DIAGNOSIS — O99333 Smoking (tobacco) complicating pregnancy, third trimester: Secondary | ICD-10-CM

## 2017-06-27 DIAGNOSIS — O09293 Supervision of pregnancy with other poor reproductive or obstetric history, third trimester: Secondary | ICD-10-CM | POA: Insufficient documentation

## 2017-06-28 ENCOUNTER — Ambulatory Visit (INDEPENDENT_AMBULATORY_CARE_PROVIDER_SITE_OTHER): Payer: Medicaid Other | Admitting: Obstetrics & Gynecology

## 2017-06-28 VITALS — BP 109/76 | HR 108 | Wt 244.9 lb

## 2017-06-28 DIAGNOSIS — O10919 Unspecified pre-existing hypertension complicating pregnancy, unspecified trimester: Secondary | ICD-10-CM

## 2017-06-28 DIAGNOSIS — O10913 Unspecified pre-existing hypertension complicating pregnancy, third trimester: Secondary | ICD-10-CM

## 2017-06-28 DIAGNOSIS — O0990 Supervision of high risk pregnancy, unspecified, unspecified trimester: Secondary | ICD-10-CM

## 2017-06-28 DIAGNOSIS — O0993 Supervision of high risk pregnancy, unspecified, third trimester: Secondary | ICD-10-CM

## 2017-06-28 NOTE — Progress Notes (Signed)
   PRENATAL VISIT NOTE  Subjective:  Sally Garner is a 32 y.o. G2P1001 at [redacted]w[redacted]d being seen today for ongoing prenatal care.  She is currently monitored for the following issues for this high-risk pregnancy and has Anemia; Smoker; Fibroid uterus; Obesity, unspecified--BMI 37.1; Hx of primary hypertension--2-3 years ago; Status post primary low transverse cesarean section--FTP, NRFHR; Pleuritic chest pain; Supervision of high risk pregnancy, antepartum; Chronic hypertension during pregnancy, antepartum; Elevated hemoglobin A1c; Unwanted fertility; Pregnancy with poor obstetric history; Obesity affecting pregnancy in third trimester; and HTN in pregnancy, chronic on their problem list.  Patient reports no complaints.  Contractions: Not present. Vag. Bleeding: None.  Movement: Present. Denies leaking of fluid.   The following portions of the patient's history were reviewed and updated as appropriate: allergies, current medications, past family history, past medical history, past social history, past surgical history and problem list. Problem list updated.  Objective:   Vitals:   06/28/17 0844  BP: 109/76  Pulse: (!) 108  Weight: 244 lb 14.4 oz (111.1 kg)    Fetal Status:     Movement: Present     General:  Alert, oriented and cooperative. Patient is in no acute distress.  Skin: Skin is warm and dry. No rash noted.   Cardiovascular: Normal heart rate noted  Respiratory: Normal respiratory effort, no problems with respiration noted  Abdomen: Soft, gravid, appropriate for gestational age.  Pain/Pressure: Present     Pelvic: Cervical exam deferred        Extremities: Normal range of motion.  Edema: Trace  Mental Status: Normal mood and affect. Normal behavior. Normal judgment and thought content.   Assessment and Plan:  Pregnancy: G2P1001 at [redacted]w[redacted]d  1. Chronic hypertension during pregnancy, antepartum reactive - Fetal nonstress test  2. Supervision of high risk pregnancy,  antepartum normotensive  Preterm labor symptoms and general obstetric precautions including but not limited to vaginal bleeding, contractions, leaking of fluid and fetal movement were reviewed in detail with the patient. Please refer to After Visit Summary for other counseling recommendations.  Return in about 1 week (around 07/05/2017) for NST.  Future Appointments  Date Time Provider [place]  07/04/2017  2:45 PM [facility] [place] 2 WH-MFCUS MFC-US    Emeterio Reeve, MD

## 2017-06-28 NOTE — Patient Instructions (Signed)
Vaginal Birth After Cesarean Delivery Vaginal birth after cesarean delivery (VBAC) is giving birth vaginally after previously delivering a baby by a cesarean. In the past, if a woman had a cesarean delivery, all births afterward would be done by cesarean delivery. This is no longer true. It can be safe for the mother to try a vaginal delivery after having a cesarean delivery. It is important to discuss VBAC with your health care provider early in the pregnancy so you can understand the risks, benefits, and options. It will give you time to decide what is best in your particular case. The final decision about whether to have a VBAC or repeat cesarean delivery should be between you and your health care provider. Any changes in your health or your baby's health during your pregnancy may make it necessary to change your initial decision about VBAC. Women who plan to have a VBAC should check with their health care provider to be sure that:  The previous cesarean delivery was done with a low transverse uterine cut (incision) (not a vertical classical incision).  The birth canal is big enough for the baby.  There were no other operations on the uterus.  An electronic fetal monitor (EFM) will be on at all times during labor.  An operating room will be available and ready in case an emergency cesarean delivery is needed.  A health care provider and surgical nursing staff will be available at all times during labor to be ready to do an emergency delivery cesarean if necessary.  An anesthesiologist will be present in case an emergency cesarean delivery is needed.  The nursery is prepared and has adequate personnel and necessary equipment available to care for the baby in case of an emergency cesarean delivery. Benefits of VBAC  Shorter stay in the hospital.  Avoidance of risks associated with cesarean delivery, such as: ? Surgical complications, such as opening of the incision or hernia in the  incision. ? Injury to other organs. ? Fever. This can occur if an infection develops after surgery. It can also occur as a reaction to the medicine given to make you numb during the surgery.  Less blood loss and need for blood transfusions.  Lower risk of blood clots and infection.  Shorter recovery.  Decreased risk for having to remove the uterus (hysterectomy).  Decreased risk for the placenta to completely or partially cover the opening of the uterus (placenta previa) with a future pregnancy.  Decrease risk in future labor and delivery. Risks of a VBAC  Tearing (rupture) of the uterus. This is occurs in less than 1% of VBACs. The risk of this happening is higher if: ? Steps are taken to begin the labor process (induce labor) or stimulate or strengthen contractions (augment labor). ? Medicine is used to soften (ripen) the cervix.  Having to remove the uterus (hysterectomy) if it ruptures. VBAC should not be done if:  The previous cesarean delivery was done with a vertical (classical) or T-shaped incision or you do not know what kind of incision was made.  You had a ruptured uterus.  You have had certain types of surgery on your uterus, such as removal of uterine fibroids. Ask your health care provider about other types of surgeries that prevent you from having a VBAC.  You have certain medical or childbirth (obstetrical) problems.  There are problems with the baby.  You have had two previous cesarean deliveries and no vaginal deliveries. Other facts to know about VBAC:  It   is safe to have an epidural anesthetic with VBAC.  It is safe to turn the baby from a breech position (attempt an external cephalic version).  It is safe to try a VBAC with twins.  VBAC may not be successful if your baby weights 8.8 lb (4 kg) or more. However, weight predictions are not always accurate and should not be used alone to decide if VBAC is right for you.  There is an increased failure rate  if the time between the cesarean delivery and VBAC is less than 19 months.  Your health care provider may advise against a VBAC if you have preeclampsia (high blood pressure, protein in the urine, and swelling of face and extremities).  VBAC is often successful if you previously gave birth vaginally.  VBAC is often successful when the labor starts spontaneously before the due date.  Delivering a baby through a VBAC is similar to having a normal spontaneous vaginal delivery. This information is not intended to replace advice given to you by your health care provider. Make sure you discuss any questions you have with your health care provider. Document Released: 07/31/2006 Document Revised: 07/16/2015 Document Reviewed: 09/06/2012 Elsevier Interactive Patient Education  2018 Elsevier Inc.  

## 2017-07-04 ENCOUNTER — Ambulatory Visit (HOSPITAL_COMMUNITY)
Admission: RE | Admit: 2017-07-04 | Discharge: 2017-07-04 | Disposition: A | Discharge status: Home / Self Care | Payer: Medicaid Other | Source: Ambulatory Visit | Attending: Certified Nurse Midwife | Admitting: Certified Nurse Midwife

## 2017-07-04 ENCOUNTER — Encounter (HOSPITAL_COMMUNITY): Payer: Self-pay

## 2017-07-04 ENCOUNTER — Other Ambulatory Visit (HOSPITAL_COMMUNITY): Payer: Medicaid Other

## 2017-07-04 ENCOUNTER — Other Ambulatory Visit (HOSPITAL_COMMUNITY): Payer: Self-pay | Admitting: *Deleted

## 2017-07-04 DIAGNOSIS — O99333 Smoking (tobacco) complicating pregnancy, third trimester: Secondary | ICD-10-CM | POA: Diagnosis not present

## 2017-07-04 DIAGNOSIS — O99213 Obesity complicating pregnancy, third trimester: Secondary | ICD-10-CM | POA: Insufficient documentation

## 2017-07-04 DIAGNOSIS — O10919 Unspecified pre-existing hypertension complicating pregnancy, unspecified trimester: Secondary | ICD-10-CM | POA: Diagnosis present

## 2017-07-04 DIAGNOSIS — Z3A37 37 weeks gestation of pregnancy: Secondary | ICD-10-CM | POA: Diagnosis not present

## 2017-07-04 DIAGNOSIS — O3413 Maternal care for benign tumor of corpus uteri, third trimester: Secondary | ICD-10-CM | POA: Insufficient documentation

## 2017-07-04 DIAGNOSIS — O10013 Pre-existing essential hypertension complicating pregnancy, third trimester: Secondary | ICD-10-CM | POA: Insufficient documentation

## 2017-07-04 DIAGNOSIS — D259 Leiomyoma of uterus, unspecified: Secondary | ICD-10-CM | POA: Diagnosis not present

## 2017-07-04 DIAGNOSIS — O09293 Supervision of pregnancy with other poor reproductive or obstetric history, third trimester: Secondary | ICD-10-CM | POA: Insufficient documentation

## 2017-07-04 DIAGNOSIS — O34219 Maternal care for unspecified type scar from previous cesarean delivery: Secondary | ICD-10-CM | POA: Insufficient documentation

## 2017-07-04 DIAGNOSIS — Z3009 Encounter for other general counseling and advice on contraception: Secondary | ICD-10-CM

## 2017-07-05 ENCOUNTER — Ambulatory Visit (INDEPENDENT_AMBULATORY_CARE_PROVIDER_SITE_OTHER): Payer: Medicaid Other | Admitting: Obstetrics and Gynecology

## 2017-07-05 ENCOUNTER — Encounter: Payer: Self-pay | Admitting: Obstetrics and Gynecology

## 2017-07-05 ENCOUNTER — Encounter (HOSPITAL_COMMUNITY): Payer: Self-pay | Admitting: *Deleted

## 2017-07-05 ENCOUNTER — Telehealth (HOSPITAL_COMMUNITY): Payer: Self-pay | Admitting: *Deleted

## 2017-07-05 VITALS — BP 126/86 | HR 106 | Wt 240.9 lb

## 2017-07-05 DIAGNOSIS — O10913 Unspecified pre-existing hypertension complicating pregnancy, third trimester: Secondary | ICD-10-CM

## 2017-07-05 DIAGNOSIS — O0993 Supervision of high risk pregnancy, unspecified, third trimester: Secondary | ICD-10-CM

## 2017-07-05 DIAGNOSIS — E669 Obesity, unspecified: Secondary | ICD-10-CM

## 2017-07-05 DIAGNOSIS — O34219 Maternal care for unspecified type scar from previous cesarean delivery: Secondary | ICD-10-CM

## 2017-07-05 DIAGNOSIS — Z98891 History of uterine scar from previous surgery: Secondary | ICD-10-CM

## 2017-07-05 DIAGNOSIS — O10919 Unspecified pre-existing hypertension complicating pregnancy, unspecified trimester: Secondary | ICD-10-CM

## 2017-07-05 DIAGNOSIS — O99213 Obesity complicating pregnancy, third trimester: Secondary | ICD-10-CM

## 2017-07-05 DIAGNOSIS — O0990 Supervision of high risk pregnancy, unspecified, unspecified trimester: Secondary | ICD-10-CM

## 2017-07-05 NOTE — Telephone Encounter (Signed)
Preadmission screen  

## 2017-07-05 NOTE — Progress Notes (Signed)
   PRENATAL VISIT NOTE  Subjective:  Sally Garner is a 32 y.o. G2P1001 at [redacted]w[redacted]d being seen today for ongoing prenatal care.  She is currently monitored for the following issues for this high-risk pregnancy and has Anemia; Smoker; Fibroid uterus; Obesity, unspecified--BMI 37.1; Hx of primary hypertension--2-3 years ago; Status post primary low transverse cesarean section--FTP, NRFHR; Pleuritic chest pain; Supervision of high risk pregnancy, antepartum; Chronic hypertension during pregnancy, antepartum; Elevated hemoglobin A1c; Unwanted fertility; Pregnancy with poor obstetric history; Obesity affecting pregnancy in third trimester; and HTN in pregnancy, chronic on their problem list.  Patient reports no complaints.  Contractions: Not present. Vag. Bleeding: None.  Movement: Present. Denies leaking of fluid.   The following portions of the patient's history were reviewed and updated as appropriate: allergies, current medications, past family history, past medical history, past social history, past surgical history and problem list. Problem list updated.  Objective:   Vitals:   07/05/17 1021  BP: 126/86  Pulse: (!) 106  Weight: 240 lb 14.4 oz (109.3 kg)    Fetal Status:     Movement: Present     General:  Alert, oriented and cooperative. Patient is in no acute distress.  Skin: Skin is warm and dry. No rash noted.   Cardiovascular: Normal heart rate noted  Respiratory: Normal respiratory effort, no problems with respiration noted  Abdomen: Soft, gravid, appropriate for gestational age.  Pain/Pressure: Present     Pelvic: Cervical exam deferred        Extremities: Normal range of motion.  Edema: Trace  Mental Status: Normal mood and affect. Normal behavior. Normal judgment and thought content.   Assessment and Plan:  Pregnancy: G2P1001 at [redacted]w[redacted]d  1. Supervision of high risk pregnancy, antepartum Patient is doing well without complaints   2. Chronic hypertension during pregnancy,  antepartum Normotensive without medication Discontinue ASA Patient will be scheduled for IOL at 40 weeks with foley bulb placement on 5/29 Continue antenatal testing NST reviewed and reactive with baseline 150, mod variability, +accels, no decels  3. Obesity affecting pregnancy in third trimester   4. Status post primary low transverse cesarean section--FTP, NRFHR Desires TOLAC  Term labor symptoms and general obstetric precautions including but not limited to vaginal bleeding, contractions, leaking of fluid and fetal movement were reviewed in detail with the patient. Please refer to After Visit Summary for other counseling recommendations.  Return in about 1 week (around 07/12/2017) for ROB, NST.  Future Appointments  Date Time Provider [place]  07/12/2017  1:30 PM [facility] [place] 1 WH-MFCUS MFC-US  07/19/2017 12:45 PM [place] 2 WH-MFCUS MFC-US    Mora Bellman, MD

## 2017-07-12 ENCOUNTER — Other Ambulatory Visit (HOSPITAL_COMMUNITY): Payer: Self-pay | Admitting: Maternal and Fetal Medicine

## 2017-07-12 ENCOUNTER — Encounter: Payer: Medicaid Other | Admitting: Certified Nurse Midwife

## 2017-07-12 ENCOUNTER — Ambulatory Visit (INDEPENDENT_AMBULATORY_CARE_PROVIDER_SITE_OTHER): Payer: Medicaid Other | Admitting: Obstetrics & Gynecology

## 2017-07-12 ENCOUNTER — Encounter (HOSPITAL_COMMUNITY): Payer: Self-pay

## 2017-07-12 ENCOUNTER — Ambulatory Visit (HOSPITAL_COMMUNITY)
Admission: RE | Admit: 2017-07-12 | Discharge: 2017-07-12 | Disposition: A | Discharge status: Home / Self Care | Payer: Medicaid Other | Source: Ambulatory Visit | Attending: Certified Nurse Midwife | Admitting: Certified Nurse Midwife

## 2017-07-12 VITALS — BP 136/90 | HR 118 | Wt 242.1 lb

## 2017-07-12 DIAGNOSIS — Z3A38 38 weeks gestation of pregnancy: Secondary | ICD-10-CM

## 2017-07-12 DIAGNOSIS — O10919 Unspecified pre-existing hypertension complicating pregnancy, unspecified trimester: Secondary | ICD-10-CM

## 2017-07-12 DIAGNOSIS — O99213 Obesity complicating pregnancy, third trimester: Secondary | ICD-10-CM | POA: Insufficient documentation

## 2017-07-12 DIAGNOSIS — O99333 Smoking (tobacco) complicating pregnancy, third trimester: Secondary | ICD-10-CM

## 2017-07-12 DIAGNOSIS — O34219 Maternal care for unspecified type scar from previous cesarean delivery: Secondary | ICD-10-CM | POA: Insufficient documentation

## 2017-07-12 DIAGNOSIS — O3413 Maternal care for benign tumor of corpus uteri, third trimester: Secondary | ICD-10-CM

## 2017-07-12 DIAGNOSIS — D259 Leiomyoma of uterus, unspecified: Secondary | ICD-10-CM | POA: Diagnosis not present

## 2017-07-12 DIAGNOSIS — O10013 Pre-existing essential hypertension complicating pregnancy, third trimester: Secondary | ICD-10-CM | POA: Insufficient documentation

## 2017-07-12 DIAGNOSIS — Z8679 Personal history of other diseases of the circulatory system: Secondary | ICD-10-CM

## 2017-07-12 DIAGNOSIS — O0990 Supervision of high risk pregnancy, unspecified, unspecified trimester: Secondary | ICD-10-CM

## 2017-07-12 DIAGNOSIS — Z3009 Encounter for other general counseling and advice on contraception: Secondary | ICD-10-CM

## 2017-07-12 DIAGNOSIS — O0993 Supervision of high risk pregnancy, unspecified, third trimester: Secondary | ICD-10-CM

## 2017-07-12 DIAGNOSIS — O10913 Unspecified pre-existing hypertension complicating pregnancy, third trimester: Secondary | ICD-10-CM | POA: Diagnosis not present

## 2017-07-12 DIAGNOSIS — O09293 Supervision of pregnancy with other poor reproductive or obstetric history, third trimester: Secondary | ICD-10-CM | POA: Insufficient documentation

## 2017-07-12 NOTE — Patient Instructions (Signed)
Labor Induction Labor induction is when steps are taken to cause a pregnant woman to begin the labor process. Most women go into labor on their own between 37 weeks and 42 weeks of the pregnancy. When this does not happen or when there is a medical need, methods may be used to induce labor. Labor induction causes a pregnant woman's uterus to contract. It also causes the cervix to soften (ripen), open (dilate), and thin out (efface). Usually, labor is not induced before 39 weeks of the pregnancy unless there is a problem with the baby or mother. Before inducing labor, your health care provider will consider a number of factors, including the following:  The medical condition of you and the baby.  How many weeks along you are.  The status of the baby's lung maturity.  The condition of the cervix.  The position of the baby. What are the reasons for labor induction? Labor may be induced for the following reasons:  The health of the baby or mother is at risk.  The pregnancy is overdue by 1 week or more.  The water breaks but labor does not start on its own.  The mother has a health condition or serious illness, such as high blood pressure, infection, placental abruption, or diabetes.  The amniotic fluid amounts are low around the baby.  The baby is distressed. Convenience or wanting the baby to be born on a certain date is not a reason for inducing labor. What methods are used for labor induction? Several methods of labor induction may be used, such as:  Prostaglandin medicine. This medicine causes the cervix to dilate and ripen. The medicine will also start contractions. It can be taken by mouth or by inserting a suppository into the vagina.  Inserting a thin tube (catheter) with a balloon on the end into the vagina to dilate the cervix. Once inserted, the balloon is expanded with water, which causes the cervix to open.  Stripping the membranes. Your health care provider separates  amniotic sac tissue from the cervix, causing the cervix to be stretched and causing the release of a hormone called progesterone. This may cause the uterus to contract. It is often done during an office visit. You will be sent home to wait for the contractions to begin. You will then come in for an induction.  Breaking the water. Your health care provider makes a hole in the amniotic sac using a small instrument. Once the amniotic sac breaks, contractions should begin. This may still take hours to see an effect.  Medicine to trigger or strengthen contractions. This medicine is given through an IV access tube inserted into a vein in your arm. All of the methods of induction, besides stripping the membranes, will be done in the hospital. Induction is done in the hospital so that you and the baby can be carefully monitored. How long does it take for labor to be induced? Some inductions can take up to 2-3 days. Depending on the cervix, it usually takes less time. It takes longer when you are induced early in the pregnancy or if this is your first pregnancy. If a mother is still pregnant and the induction has been going on for 2-3 days, either the mother will be sent home or a cesarean delivery will be needed. What are the risks associated with labor induction? Some of the risks of induction include:  Changes in fetal heart rate, such as too high, too low, or erratic.  Fetal distress.    Chance of infection for the mother and baby.  Increased chance of having a cesarean delivery.  Breaking off (abruption) of the placenta from the uterus (rare).  Uterine rupture (very rare). When induction is needed for medical reasons, the benefits of induction may outweigh the risks. What are some reasons for not inducing labor? Labor induction should not be done if:  It is shown that your baby does not tolerate labor.  You have had previous surgeries on your uterus, such as a myomectomy or the removal of  fibroids.  Your placenta lies very low in the uterus and blocks the opening of the cervix (placenta previa).  Your baby is not in a head-down position.  The umbilical cord drops down into the birth canal in front of the baby. This could cut off the baby's blood and oxygen supply.  You have had a previous cesarean delivery.  There are unusual circumstances, such as the baby being extremely premature. This information is not intended to replace advice given to you by your health care provider. Make sure you discuss any questions you have with your health care provider. Document Released: 06/29/2006 Document Revised: 07/16/2015 Document Reviewed: 09/06/2012 Elsevier Interactive Patient Education  2017 Elsevier Inc.  

## 2017-07-12 NOTE — Progress Notes (Signed)
   PRENATAL VISIT NOTE  Subjective:  Sally Garner is a 32 y.o. G2P1001 at [redacted]w[redacted]d being seen today for ongoing prenatal care.  She is currently monitored for the following issues for this high-risk pregnancy and has Anemia; Smoker; Fibroid uterus; Obesity, unspecified--BMI 37.1; Hx of primary hypertension--2-3 years ago; Status post primary low transverse cesarean section--FTP, NRFHR; Pleuritic chest pain; Supervision of high risk pregnancy, antepartum; Chronic hypertension during pregnancy, antepartum; Elevated hemoglobin A1c; Unwanted fertility; Pregnancy with poor obstetric history; Obesity affecting pregnancy in third trimester; and HTN in pregnancy, chronic on their problem list.  Patient reports no complaints.  Contractions: Not present. Vag. Bleeding: None.  Movement: Present. Denies leaking of fluid.   The following portions of the patient's history were reviewed and updated as appropriate: allergies, current medications, past family history, past medical history, past social history, past surgical history and problem list. Problem list updated.  Objective:   Vitals:   07/12/17 1440  BP: 136/90  Pulse: (!) 118  Weight: 242 lb 1.6 oz (109.8 kg)    Fetal Status: Fetal Heart Rate (bpm): NST   Movement: Present     General:  Alert, oriented and cooperative. Patient is in no acute distress.  Skin: Skin is warm and dry. No rash noted.   Cardiovascular: Normal heart rate noted  Respiratory: Normal respiratory effort, no problems with respiration noted  Abdomen: Soft, gravid, appropriate for gestational age.  Pain/Pressure: Present     Pelvic: Cervical exam deferred        Extremities: Normal range of motion.  Edema: Trace  Mental Status: Normal mood and affect. Normal behavior. Normal judgment and thought content.   Assessment and Plan:  Pregnancy: G2P1001 at [redacted]w[redacted]d  1. Chronic hypertension during pregnancy, antepartum Reactive, BPP 8/8 today - Fetal nonstress test  2.  Supervision of high risk pregnancy, antepartum BP reviewed and not c/w preeclampsia  Term labor symptoms and general obstetric precautions including but not limited to vaginal bleeding, contractions, leaking of fluid and fetal movement were reviewed in detail with the patient. Please refer to After Visit Summary for other counseling recommendations.  Return in about 1 week (around 07/19/2017) for Foley and NST.  Future Appointments  Date Time Provider [place]  07/19/2017 12:45 PM [place] 2 WH-MFCUS MFC-US  07/20/2017  7:00 AM WH-BSSCHED ROOM WH-BSSCHED None    Emeterio Reeve, MD

## 2017-07-19 ENCOUNTER — Other Ambulatory Visit (HOSPITAL_COMMUNITY): Payer: Self-pay | Admitting: Maternal and Fetal Medicine

## 2017-07-19 ENCOUNTER — Ambulatory Visit (HOSPITAL_COMMUNITY)
Admission: RE | Admit: 2017-07-19 | Discharge: 2017-07-19 | Disposition: A | Discharge status: Home / Self Care | Payer: Medicaid Other | Source: Ambulatory Visit | Attending: Certified Nurse Midwife | Admitting: Certified Nurse Midwife

## 2017-07-19 ENCOUNTER — Encounter: Payer: Self-pay | Admitting: Obstetrics and Gynecology

## 2017-07-19 ENCOUNTER — Ambulatory Visit (INDEPENDENT_AMBULATORY_CARE_PROVIDER_SITE_OTHER): Payer: Medicaid Other | Admitting: Obstetrics and Gynecology

## 2017-07-19 ENCOUNTER — Encounter (HOSPITAL_COMMUNITY): Payer: Self-pay

## 2017-07-19 ENCOUNTER — Other Ambulatory Visit: Payer: Self-pay

## 2017-07-19 VITALS — BP 125/87 | HR 103 | Wt 247.0 lb

## 2017-07-19 DIAGNOSIS — O3413 Maternal care for benign tumor of corpus uteri, third trimester: Secondary | ICD-10-CM

## 2017-07-19 DIAGNOSIS — O99333 Smoking (tobacco) complicating pregnancy, third trimester: Secondary | ICD-10-CM | POA: Insufficient documentation

## 2017-07-19 DIAGNOSIS — O10919 Unspecified pre-existing hypertension complicating pregnancy, unspecified trimester: Secondary | ICD-10-CM

## 2017-07-19 DIAGNOSIS — O10013 Pre-existing essential hypertension complicating pregnancy, third trimester: Secondary | ICD-10-CM

## 2017-07-19 DIAGNOSIS — O0990 Supervision of high risk pregnancy, unspecified, unspecified trimester: Secondary | ICD-10-CM

## 2017-07-19 DIAGNOSIS — Z3A39 39 weeks gestation of pregnancy: Secondary | ICD-10-CM

## 2017-07-19 DIAGNOSIS — Z98891 History of uterine scar from previous surgery: Secondary | ICD-10-CM

## 2017-07-19 DIAGNOSIS — D259 Leiomyoma of uterus, unspecified: Secondary | ICD-10-CM | POA: Insufficient documentation

## 2017-07-19 DIAGNOSIS — Z3009 Encounter for other general counseling and advice on contraception: Secondary | ICD-10-CM

## 2017-07-19 DIAGNOSIS — R7309 Other abnormal glucose: Secondary | ICD-10-CM

## 2017-07-19 NOTE — Progress Notes (Signed)
Presents for NST/Foley Buld insert.

## 2017-07-19 NOTE — Progress Notes (Signed)
   PRENATAL VISIT NOTE  Subjective:  Sally Garner is a 32 y.o. G2P1001 at [redacted]w[redacted]d being seen today for ongoing prenatal care.  She is currently monitored for the following issues for this high-risk pregnancy and has Anemia; Smoker; Fibroid uterus; Obesity, unspecified--BMI 37.1; Hx of primary hypertension--2-3 years ago; Status post primary low transverse cesarean section--FTP, NRFHR; Pleuritic chest pain; Supervision of high risk pregnancy, antepartum; Chronic hypertension during pregnancy, antepartum; Elevated hemoglobin A1c; Unwanted fertility; Pregnancy with poor obstetric history; Obesity affecting pregnancy in third trimester; and HTN in pregnancy, chronic on their problem list.  Patient reports no complaints.  Contractions: Not present. Vag. Bleeding: None.  Movement: Present. Denies leaking of fluid.   The following portions of the patient's history were reviewed and updated as appropriate: allergies, current medications, past family history, past medical history, past social history, past surgical history and problem list. Problem list updated.  Objective:   Vitals:   07/19/17 1416  BP: 125/87  Pulse: (!) 103  Weight: 247 lb (112 kg)    Fetal Status: Fetal Heart Rate (bpm): NST   Movement: Present     General:  Alert, oriented and cooperative. Patient is in no acute distress.  Skin: Skin is warm and dry. No rash noted.   Cardiovascular: Normal heart rate noted  Respiratory: Normal respiratory effort, no problems with respiration noted  Abdomen: Soft, gravid, appropriate for gestational age.  Pain/Pressure: Present     Pelvic: Cervical exam performed      1 (external os)/20/-3  Extremities: Normal range of motion.  Edema: Trace  Mental Status: Normal mood and affect. Normal behavior. Normal judgment and thought content.   Assessment and Plan:  Pregnancy: G2P1001 at [redacted]w[redacted]d  1. Chronic hypertension during pregnancy, antepartum Stable Patient for IOL tomorrow, scheduled for  foley bulb insertion today, reviewed risks/benefits, patient agreeable however patient could not tolerate cervical exam, could not get to internal os and she declined further attempts Reviewed instructions to present for IOL tomorrow am and earlier if needed NST reactive today  2. Status post primary low transverse cesarean section--FTP, NRFHR For TOLAC  3. Supervision of high risk pregnancy, antepartum  5. Unwanted fertility For BTL  Term labor symptoms and general obstetric precautions including but not limited to vaginal bleeding, contractions, leaking of fluid and fetal movement were reviewed in detail with the patient. Please refer to After Visit Summary for other counseling recommendations.  Return in about 1 month (around 08/16/2017) for post partum check.  Future Appointments  Date Time Provider [place]  07/20/2017  7:00 AM WH-BSSCHED ROOM WH-BSSCHED None    Sloan Leiter, MD

## 2017-07-20 ENCOUNTER — Inpatient Hospital Stay (HOSPITAL_COMMUNITY)
Admission: RE | Admit: 2017-07-20 | Discharge: 2017-07-25 | DRG: 785 | Disposition: A | Discharge status: Home / Self Care | Payer: Medicaid Other | Attending: Family Medicine | Admitting: Family Medicine

## 2017-07-20 ENCOUNTER — Encounter (HOSPITAL_COMMUNITY): Payer: Self-pay

## 2017-07-20 DIAGNOSIS — E669 Obesity, unspecified: Secondary | ICD-10-CM | POA: Diagnosis present

## 2017-07-20 DIAGNOSIS — D259 Leiomyoma of uterus, unspecified: Secondary | ICD-10-CM | POA: Diagnosis present

## 2017-07-20 DIAGNOSIS — O9902 Anemia complicating childbirth: Secondary | ICD-10-CM | POA: Diagnosis present

## 2017-07-20 DIAGNOSIS — O99214 Obesity complicating childbirth: Secondary | ICD-10-CM | POA: Diagnosis present

## 2017-07-20 DIAGNOSIS — Z3009 Encounter for other general counseling and advice on contraception: Secondary | ICD-10-CM

## 2017-07-20 DIAGNOSIS — D509 Iron deficiency anemia, unspecified: Secondary | ICD-10-CM | POA: Diagnosis present

## 2017-07-20 DIAGNOSIS — Z3A40 40 weeks gestation of pregnancy: Secondary | ICD-10-CM | POA: Diagnosis not present

## 2017-07-20 DIAGNOSIS — O480 Post-term pregnancy: Secondary | ICD-10-CM | POA: Diagnosis not present

## 2017-07-20 DIAGNOSIS — Z98891 History of uterine scar from previous surgery: Secondary | ICD-10-CM

## 2017-07-20 DIAGNOSIS — O99334 Smoking (tobacco) complicating childbirth: Secondary | ICD-10-CM | POA: Diagnosis present

## 2017-07-20 DIAGNOSIS — O3663X0 Maternal care for excessive fetal growth, third trimester, not applicable or unspecified: Secondary | ICD-10-CM | POA: Diagnosis present

## 2017-07-20 DIAGNOSIS — F17210 Nicotine dependence, cigarettes, uncomplicated: Secondary | ICD-10-CM | POA: Diagnosis present

## 2017-07-20 DIAGNOSIS — O1002 Pre-existing essential hypertension complicating childbirth: Secondary | ICD-10-CM | POA: Diagnosis present

## 2017-07-20 DIAGNOSIS — Z302 Encounter for sterilization: Secondary | ICD-10-CM

## 2017-07-20 DIAGNOSIS — Z9851 Tubal ligation status: Secondary | ICD-10-CM

## 2017-07-20 DIAGNOSIS — O34211 Maternal care for low transverse scar from previous cesarean delivery: Secondary | ICD-10-CM | POA: Diagnosis present

## 2017-07-20 DIAGNOSIS — O3413 Maternal care for benign tumor of corpus uteri, third trimester: Secondary | ICD-10-CM | POA: Diagnosis present

## 2017-07-20 DIAGNOSIS — O10919 Unspecified pre-existing hypertension complicating pregnancy, unspecified trimester: Secondary | ICD-10-CM | POA: Diagnosis present

## 2017-07-20 LAB — CBC
HCT: 28.8 % — ABNORMAL LOW (ref 36.0–46.0)
HEMOGLOBIN: 9.3 g/dL — AB (ref 12.0–15.0)
MCH: 28.1 pg (ref 26.0–34.0)
MCHC: 32.3 g/dL (ref 30.0–36.0)
MCV: 87 fL (ref 78.0–100.0)
PLATELETS: 304 10*3/uL (ref 150–400)
RBC: 3.31 MIL/uL — AB (ref 3.87–5.11)
RDW: 13.9 % (ref 11.5–15.5)
WBC: 10.1 10*3/uL (ref 4.0–10.5)

## 2017-07-20 LAB — RPR: RPR Ser Ql: NONREACTIVE

## 2017-07-20 LAB — PREPARE RBC (CROSSMATCH)

## 2017-07-20 MED ORDER — FENTANYL CITRATE (PF) 100 MCG/2ML IJ SOLN
100.0000 ug | INTRAMUSCULAR | Status: DC | PRN
  Administered 2017-07-20 – 2017-07-21 (×2): 100 ug via INTRAVENOUS
  Filled 2017-07-20 (×2): qty 2

## 2017-07-20 MED ORDER — TERBUTALINE SULFATE 1 MG/ML IJ SOLN: 0.2500 mg | Freq: Once | INTRAMUSCULAR | Status: DC | PRN

## 2017-07-20 MED ORDER — FLEET ENEMA 7-19 GM/118ML RE ENEM: 1.0000 | ENEMA | RECTAL | Status: DC | PRN

## 2017-07-20 MED ORDER — ONDANSETRON HCL 4 MG/2ML IJ SOLN: 4.0000 mg | Freq: Four times a day (QID) | INTRAMUSCULAR | Status: DC | PRN

## 2017-07-20 MED ORDER — OXYTOCIN BOLUS FROM INFUSION: 500.0000 mL | Freq: Once | INTRAVENOUS | Status: DC

## 2017-07-20 MED ORDER — ACETAMINOPHEN 325 MG PO TABS
650.0000 mg | ORAL_TABLET | ORAL | Status: DC | PRN
  Administered 2017-07-21 – 2017-07-23 (×4): 650 mg via ORAL
  Filled 2017-07-20 (×4): qty 2

## 2017-07-20 MED ORDER — OXYTOCIN 40 UNITS IN LACTATED RINGERS INFUSION - SIMPLE MED
1.0000 m[IU]/min | INTRAVENOUS | Status: DC
  Administered 2017-07-20: 1 m[IU]/min via INTRAVENOUS
  Administered 2017-07-21: 16 m[IU]/min via INTRAVENOUS
  Filled 2017-07-20 (×2): qty 1000

## 2017-07-20 MED ORDER — SOD CITRATE-CITRIC ACID 500-334 MG/5ML PO SOLN
30.0000 mL | ORAL | Status: DC | PRN
  Administered 2017-07-23: 30 mL via ORAL
  Filled 2017-07-20: qty 15

## 2017-07-20 MED ORDER — OXYTOCIN 40 UNITS IN LACTATED RINGERS INFUSION - SIMPLE MED: 2.5000 [IU]/h | INTRAVENOUS | Status: DC

## 2017-07-20 MED ORDER — LACTATED RINGERS IV SOLN
INTRAVENOUS | Status: DC
  Administered 2017-07-20 – 2017-07-23 (×10): via INTRAVENOUS

## 2017-07-20 MED ORDER — LIDOCAINE HCL (PF) 1 % IJ SOLN: 30.0000 mL | INTRAMUSCULAR | Status: DC | PRN

## 2017-07-20 MED ORDER — ZOLPIDEM TARTRATE 5 MG PO TABS
5.0000 mg | ORAL_TABLET | Freq: Every evening | ORAL | Status: DC | PRN
  Administered 2017-07-20: 5 mg via ORAL
  Filled 2017-07-20: qty 1

## 2017-07-20 MED ORDER — OXYCODONE-ACETAMINOPHEN 5-325 MG PO TABS: 2.0000 | ORAL_TABLET | ORAL | Status: DC | PRN

## 2017-07-20 MED ORDER — LACTATED RINGERS IV SOLN
500.0000 mL | INTRAVENOUS | Status: DC | PRN
  Administered 2017-07-21: 500 mL via INTRAVENOUS

## 2017-07-20 MED ORDER — MISOPROSTOL 50MCG HALF TABLET: 50.0000 ug | ORAL_TABLET | ORAL | Status: DC | PRN

## 2017-07-20 MED ORDER — OXYCODONE-ACETAMINOPHEN 5-325 MG PO TABS: 1.0000 | ORAL_TABLET | ORAL | Status: DC | PRN

## 2017-07-20 NOTE — H&P (Addendum)
LABOR ADMISSION HISTORY AND PHYSICAL  Sally Garner is a 32 y.o. female G2P1001 with IUP at [redacted]w[redacted]d by [place] presenting for IOL for CHTN with TOLAC (consent signed). She reports +FMs, No LOF, no VB, no blurry vision, headaches, CP, peripheral edema, or RUQ pain.  She plans on Breast and Bottle feeding. She request BTL for birth control (consent signed).   Prenatal History/Complications: PNC Office: [facility] Chronic HTN Obesity Unwanted Fertility H/o pLTCS for NRFHR Fibroids Tobacco Use disorder  Past Medical History: Past Medical History:  Diagnosis Date  . Hypertension    was put on medication end of 03/2015; states has caused headache and is not taking as prescribed  . Migraines   . Symptomatic cholelithiasis 04/2015    Past Surgical History: Past Surgical History:  Procedure Laterality Date  . CESAREAN SECTION N/A 05/25/2013   Procedure: CESAREAN SECTION;  Surgeon: Annalee Genta, DO;  Location: [facility];  Service: Obstetrics;  Laterality: N/A;  . CHOLECYSTECTOMY N/A 05/05/2015   Procedure: LAPAROSCOPIC CHOLECYSTECTOMY;  Surgeon: Coralie Keens, MD;  Location: [facility];  Service: General;  Laterality: N/A;    Obstetrical History: OB History    Gravida  2   Para  1   Term  1   Preterm      AB      Living  1     SAB      TAB      Ectopic      Multiple      Live Births  1           Social History: Social History   Socioeconomic History  . Marital status: Single    Spouse name: Not on file  . Number of children: Not on file  . Years of education: Not on file  . Highest education level: Not on file  Occupational History  . Not on file  Social Needs  . Financial resource strain: Not on file  . Food insecurity:    Worry: Not on file    Inability: Not on file  . Transportation needs:    Medical: Not on file    Non-medical: Not on file  Tobacco Use  . Smoking status: Current Some Day Smoker    Packs/day: 0.50    Years: 3.00     Pack years: 1.50    Types: Cigarettes  . Smokeless tobacco: Never Used  Substance and Sexual Activity  . Alcohol use: Yes    Comment: occasional  . Drug use: No  . Sexual activity: Yes    Birth control/protection: None  Lifestyle  . Physical activity:    Days per week: Not on file    Minutes per session: Not on file  . Stress: Not on file  Relationships  . Social connections:    Talks on phone: Not on file    Gets together: Not on file    Attends religious service: Not on file    Active member of club or organization: Not on file    Attends meetings of clubs or organizations: Not on file    Relationship status: Not on file  Other Topics Concern  . Not on file  Social History Narrative  . Not on file    Family History: Family History  Problem Relation Age of Onset  . Hypertension Maternal Aunt   . Hypertension Maternal Uncle   . Birth defects Brother        polydactyl  . Cancer Paternal Aunt  breast cancer    Allergies: No Known Allergies  Medications Prior to Admission  Medication Sig Dispense Refill Last Dose  . calcium carbonate (TUMS - DOSED IN MG ELEMENTAL CALCIUM) 500 MG chewable tablet Chew 1 tablet by mouth 2 (two) times daily as needed for indigestion or heartburn.   Taking  . Prenatal Vit-Fe Fumarate-FA (PRENATAL MULTIVITAMIN) TABS tablet Take 1 tablet by mouth daily at 12 noon.   Taking    Review of Systems   All systems reviewed and negative except as stated in HPI  Blood pressure 125/78, pulse 97, temperature 98.3 F (36.8 C), temperature source Oral, resp. rate 18, height 5\' 4"  (1.626 m), weight 243 lb 14.4 oz (110.6 kg), last menstrual period 10/13/2016. General appearance: alert and no distress HEENT: no JVD, MMM Abdomen: soft, non-tender; bowel sounds normal Pelvic: 1/thick/posterior Extremities: Homans sign is negative, no sign of DVT Presentation: cephalic Fetal monitoringBaseline: 140 bpm, Variability: Good {> 6 bpm), Accelerations:  Reactive and Decelerations: Absent Uterine activity: irregular   Dating: By 5 wk [place] --->  Estimated Date of Delivery: 07/20/17  Prenatal labs: ABO, Rh: --/--/O POS (05/30 7253) Antibody: PENDING (05/30 0746) Rubella: 3.99 (11/08 1411) RPR: Non Reactive (03/06 1122)  HBsAg: Negative (11/08 1411)  HIV: Non Reactive (03/06 1122)  GBS: Negative (04/30 1518)  2 hr Glucola wnl  Prenatal Transfer Tool  Maternal Diabetes: No Genetic Screening: Normal Maternal Ultrasounds/Referrals: Normal Fetal Ultrasounds or other Referrals:  None Maternal Substance Abuse:  No Significant Maternal Medications:  None Significant Maternal Lab Results: None  Results for orders placed or performed during the hospital encounter of 07/20/17 (from the past 24 hour(s))  CBC   Collection Time: 07/20/17  7:46 AM  Result Value Ref Range   WBC 10.1 4.0 - 10.5 K/uL   RBC 3.31 (L) 3.87 - 5.11 MIL/uL   Hemoglobin 9.3 (L) 12.0 - 15.0 g/dL   HCT 28.8 (L) 36.0 - 46.0 %   MCV 87.0 78.0 - 100.0 fL   MCH 28.1 26.0 - 34.0 pg   MCHC 32.3 30.0 - 36.0 g/dL   RDW 13.9 11.5 - 15.5 %   Platelets 304 150 - 400 K/uL  Type and screen [place]   Collection Time: 07/20/17  7:46 AM  Result Value Ref Range   ABO/RH(D) O POS    Antibody Screen NEG    Sample Expiration      07/23/2017 Performed at [facility], [address] [place], [place] [address]     Assessment: Sally Garner is a 32 y.o. G2P1001 at [redacted]w[redacted]d here for  IOL for CHTN with TOLAC (consent signed).   #Labor:TOLAC, trial pitocin, FB attempted in office of 5/29, but did not tolerate. Will reattempt as labor progresses. Cytotec contraindicated due to prior C/S #HTN: currently normotensive w/o medication. Will cont to monitor. #Pain: Epidural #FWB: Cat I #ID:  GBS neg #MOF: Both #MOC:BTL (consent signed 03/23/17) #Circ:  Outpatient  Marny Lowenstein, MD, [place] - PGY1 07/20/2017 8:49 AM  OB FELLOW HISTORY AND  PHYSICAL ATTESTATION I have seen and examined this patient; I agree with above documentation in the resident's note.   Pt here for IOL d/t CHTN. TOLAC - IOL: Start low-dose Pitocin. TOLAC consent signed. Attempt FB again later - GBS neg - FHT Cat I - Plan for postpartum BTL (Medicaid consent is signed > 30 days and in the chart)  Gailen Shelter, MD OB Fellow 07/20/2017, 9:52 AM

## 2017-07-20 NOTE — Progress Notes (Signed)
LABOR PROGRESS NOTE  Sally Garner is a 32 y.o. G2P1001 at [redacted]w[redacted]d  admitted for   Subjective: Pt is doing well. No complaints.   Objective: BP 125/85   Pulse 90   Temp 97.9 F (36.6 C) (Oral)   Resp 18   Ht 5\' 4"  (1.626 m)   Wt 243 lb 14.4 oz (110.6 kg)   LMP 10/13/2016   BMI 41.87 kg/m  or  Vitals:   07/20/17 1249 07/20/17 1353 07/20/17 1602 07/20/17 1635  BP: 112/76 121/85 120/81 125/85  Pulse: 96 95 91 90  Resp: 18  18   Temp:   97.9 F (36.6 C)   TempSrc:   Oral   Weight:      Height:       Dilation: 1 Effacement (%): Thick Cervical Position: Posterior Station: -3 Presentation: Vertex Exam by:: Dr Lambert Keto FHT: BL 130, mod var, accel, no decel Uterine activity: every 4-6  Labs: Lab Results  Component Value Date   WBC 10.1 07/20/2017   HGB 9.3 (L) 07/20/2017   HCT 28.8 (L) 07/20/2017   MCV 87.0 07/20/2017   PLT 304 07/20/2017    Patient Active Problem List   Diagnosis Date Noted  . Preexisting hypertension complicating pregnancy, antepartum 07/20/2017  . Pregnancy with poor obstetric history   . Obesity affecting pregnancy in third trimester   . HTN in pregnancy, chronic   . Unwanted fertility 03/23/2017  . Elevated hemoglobin A1c 01/03/2017  . Chronic hypertension during pregnancy, antepartum 01/02/2017  . Supervision of high risk pregnancy, antepartum 12/29/2016  . Pleuritic chest pain 07/12/2013  . Status post primary low transverse cesarean section--FTP, NRFHR 05/26/2013  . Anemia 05/24/2013  . Smoker 05/24/2013  . Fibroid uterus 05/24/2013  . Obesity, unspecified--BMI 37.1 05/24/2013  . Hx of primary hypertension--2-3 years ago 05/24/2013    Assessment / Plan: 32 y.o. G2P1001 at [redacted]w[redacted]d here for IOL for cHTN  HTN: BP normotensive, cont to monitor Labor: FB in place, cont pit Fetal Wellbeing:  Cat 1 Pain Control:  Supportive care, epidural when further progressed Anticipated MOD:  NSVD  Bonnita Hollow, MD PGY-1 5/30/20194:47  PM

## 2017-07-20 NOTE — Progress Notes (Signed)
LABOR PROGRESS NOTE  Sally Garner is a 32 y.o. G2P1001 at [redacted]w[redacted]d  admitted for IOL for [facility]   Subjective: Patient doing well. Feeling some mild contractions.   Objective: BP 128/76   Pulse 99   Temp 97.9 F (36.6 C) (Oral)   Resp 18   Ht 5\' 4"  (1.626 m)   Wt 243 lb 14.4 oz (110.6 kg)   LMP 10/13/2016   BMI 41.87 kg/m  or  Vitals:   07/20/17 1602 07/20/17 1635 07/20/17 1725 07/20/17 2044  BP: 120/81 125/85 127/77 128/76  Pulse: 91 90 95 99  Resp: 18 18 18    Temp: 97.9 F (36.6 C)     TempSrc: Oral     Weight:      Height:        SVE: Dilation: 2 Effacement (%): Thick Cervical Position: Posterior Station: -3 Presentation: Vertex Exam by:: Adanya Sosinski,DO   FHT: baseline rate 135, moderate varibility, +acel, no decel Toco: ctx q 2-4 min  Pitocin @ 59mu/min  Assessment / Plan: 32 y.o. G2P1001 at [redacted]w[redacted]d here for IOL for CHTN. TOLAC.  Labor: Low dose pit for cervical ripening. FB still in place.  Fetal Wellbeing:  Cat I Pain Control:  IV pain meds; planning on epidural Anticipated MOD:  VBAC  [name], DO 07/20/2017, 8:56 PM

## 2017-07-20 NOTE — Anesthesia Pain Management Evaluation Note (Signed)
  CRNA Pain Management Visit Note  Patient: Sally Garner, 32 y.o., female  "Hello I am a member of the anesthesia team at [facility]. We have an anesthesia team available at all times to provide care throughout the hospital, including epidural management and anesthesia for C-section. I don't know your plan for the delivery whether it a natural birth, water birth, IV sedation, nitrous supplementation, doula or epidural, but we want to meet your pain goals."   1.Was your pain managed to your expectations on prior hospitalizations?   Yes   2.What is your expectation for pain management during this hospitalization?     Epidural  3.How can we help you reach that goal? unsure  Record the patient's initial score and the patient's pain goal.   Pain: 0  Pain Goal: 7 [facility] wants you to be able to say your pain was always managed very well.  Casimer Lanius 07/20/2017

## 2017-07-20 NOTE — Progress Notes (Signed)
LABOR PROGRESS NOTE  Sally Garner is a 32 y.o. G2P1001 at [redacted]w[redacted]d  admitted for IOL for [facility].   Subjective: Patient doing well. Feeling some mild contractions  Objective: BP 112/76   Pulse 96   Temp 98.3 F (36.8 C) (Oral)   Resp 18   Ht 5\' 4"  (1.626 m)   Wt 110.6 kg (243 lb 14.4 oz)   LMP 10/13/2016   BMI 41.87 kg/m  or  Vitals:   07/20/17 1002 07/20/17 1052 07/20/17 1138 07/20/17 1249  BP: 126/85 128/86 (!) 136/93 112/76  Pulse: 95 96 97 96  Resp: 18 18 18 18   Temp:   98.3 F (36.8 C)   TempSrc:   Oral   Weight:      Height:        SVE: Dilation: 1 Effacement (%): Thick Cervical Position: Posterior Station: -3 Presentation: Vertex Exam by:: Dr Grandville Silos FHT: baseline rate 140, moderate varibility, +acel, no decel Toco: ctx q 2-6 min   Assessment / Plan: 32 y.o. G2P1001 at [redacted]w[redacted]d here for IOL for CHTN. TOLAC  Labor: Low dose pit for cervical ripening given TOLAC. FB now also placed Fetal Wellbeing:  Cat I Pain Control:  IV pain meds; planning on epidural Anticipated MOD:  VBAC  Gailen Shelter, MD 07/20/2017, 2:10 PM

## 2017-07-21 MED ORDER — DIPHENHYDRAMINE HCL 50 MG/ML IJ SOLN: 12.5000 mg | INTRAMUSCULAR | Status: DC | PRN

## 2017-07-21 MED ORDER — PHENYLEPHRINE 40 MCG/ML (10ML) SYRINGE FOR IV PUSH (FOR BLOOD PRESSURE SUPPORT)
80.0000 ug | PREFILLED_SYRINGE | INTRAVENOUS | Status: DC | PRN
  Filled 2017-07-21: qty 10

## 2017-07-21 MED ORDER — FENTANYL 2.5 MCG/ML BUPIVACAINE 1/10 % EPIDURAL INFUSION (WH - ANES)
14.0000 mL/h | INTRAMUSCULAR | Status: DC | PRN
  Administered 2017-07-23: 14 mL/h via EPIDURAL
  Filled 2017-07-21 (×2): qty 100

## 2017-07-21 MED ORDER — OXYTOCIN 40 UNITS IN LACTATED RINGERS INFUSION - SIMPLE MED
1.0000 m[IU]/min | INTRAVENOUS | Status: DC
  Administered 2017-07-22: 2 m[IU]/min via INTRAVENOUS
  Filled 2017-07-21: qty 1000

## 2017-07-21 MED ORDER — PHENYLEPHRINE 40 MCG/ML (10ML) SYRINGE FOR IV PUSH (FOR BLOOD PRESSURE SUPPORT): 80.0000 ug | PREFILLED_SYRINGE | INTRAVENOUS | Status: DC | PRN

## 2017-07-21 MED ORDER — EPHEDRINE 5 MG/ML INJ: 10.0000 mg | INTRAVENOUS | Status: DC | PRN

## 2017-07-21 MED ORDER — LACTATED RINGERS IV SOLN
500.0000 mL | Freq: Once | INTRAVENOUS | Status: AC
  Administered 2017-07-23: 12:00:00 via INTRAVENOUS

## 2017-07-21 NOTE — Progress Notes (Signed)
LABOR PROGRESS NOTE  Kailani Brass is a 32 y.o. G2P1001 at [redacted]w[redacted]d  admitted for IOL for CHTN  Subjective: Patient doing well. Denies any concerns.  Objective: BP 122/78   Pulse 89   Temp 98.1 F (36.7 C) (Oral)   Resp 18   Ht 5\' 4"  (1.626 m)   Wt 110.6 kg (243 lb 14.4 oz)   LMP 10/13/2016   BMI 41.87 kg/m  or  Vitals:   07/21/17 1557 07/21/17 1600 07/21/17 1630 07/21/17 1700  BP:  (!) 128/93 (!) 128/92 122/78  Pulse:  92 99 89  Resp: 18 20  18   Temp:  98.1 F (36.7 C)    TempSrc:  Oral    Weight:      Height:       FB out. SVE 3.5cm/90%/3  FHT: baseline rate 125, moderate varibility, +acel, no decel Toco: ctx q 3-4 min  Assessment / Plan: 32 y.o. G2P1001 at 100w1d here for IOL for [facility]  Labor: Pt on Pitocin > 24 hours. FB now out. Will give 4-hr pitocin break, then restart Pitocin Fetal Wellbeing:  Cat I Pain Control:  Per patient's request; not currently requiring any  meds Anticipated MOD:  Hopeful for VBAC  Gailen Shelter, MD 07/21/2017, 5:13 PM

## 2017-07-21 NOTE — Progress Notes (Signed)
LABOR PROGRESS NOTE  Laverda Stribling is a 32 y.o. G2P1001 at [redacted]w[redacted]d  admitted for IOL for [facility].   Subjective: Resting. No concerns voiced.   Objective: BP 125/85   Pulse 91   Temp 97.9 F (36.6 C) (Oral)   Resp 18   Ht 5\' 4"  (1.626 m)   Wt 243 lb 14.4 oz (110.6 kg)   LMP 10/13/2016   BMI 41.87 kg/m  or  Vitals:   07/21/17 0501 07/21/17 0531 07/21/17 0601 07/21/17 0631  BP: 111/74 121/75 117/86 125/85  Pulse: 90 94 93 91  Resp:      Temp:      TempSrc:      Weight:      Height:        SVE: Dilation: 2 Effacement (%): Thick Cervical Position: Posterior Station: -3 Presentation: Vertex Exam by:: Jedi Catalfamo   FHT: baseline rate 125, moderate varibility, +acel, no decel Toco: ctx q 2-5 min  Pitocin @ 80mu/min  Assessment / Plan: 32 y.o. G2P1001 at [redacted]w[redacted]d here for IOL for CHTN. TOLAC. Not in labor  Labor: FB in place. Continue low dose Pit at 77mu/min. Await increasing while FB in.  CHTN: BPs stable  Fetal Wellbeing:  Cat I Pain Control:  IV pain meds; planning on epidural Anticipated MOD:  VBAC   [name], DO 07/21/2017, 6:40 AM

## 2017-07-21 NOTE — Progress Notes (Signed)
LABOR PROGRESS NOTE  Sally Garner is a 32 y.o. G2P1001 at [redacted]w[redacted]d  admitted for IOL for [facility]  Subjective: Patient doing well. Denies any concerns.  Objective: BP 129/77   Pulse 99   Temp 98.6 F (37 C)   Resp 16   Ht 5\' 4"  (1.626 m)   Wt 110.6 kg (243 lb 14.4 oz)   LMP 10/13/2016   BMI 41.87 kg/m  or  Vitals:   07/21/17 1938 07/21/17 2003 07/21/17 2123 07/21/17 2131  BP: 131/79 136/89 131/80 129/77  Pulse: (!) 101 (!) 104 98 99  Resp:  16    Temp:      TempSrc:      Weight:      Height:       FB out. Dilation: 3.5 Effacement (%): 80 Cervical Position: Posterior Station: -3 Presentation: Vertex Exam by:: Sheryle Hail, RN  FHT: baseline rate 140, moderate varibility, +acel, no decel Toco: occasional ctx  Assessment / Plan: 32 y.o. G2P1001 at [redacted]w[redacted]d here for IOL for [facility] Not in labor.  Labor: Pt has been restarted. Will continue to increase then AROM.  CHTN: BPs stable Fetal Wellbeing:  Cat I Pain Control:  Per patient's request; not currently requiring any  meds Anticipated MOD:  Hopeful for VBAC  [name], DO 07/21/2017, 10:01 PM

## 2017-07-21 NOTE — Progress Notes (Signed)
LABOR PROGRESS NOTE  Sally Garner is a 32 y.o. G2P1001 at [redacted]w[redacted]d  admitted for IOL for [facility].   Subjective: Resting.  Objective: BP 125/82   Pulse 97   Temp 97.9 F (36.6 C) (Oral)   Resp 18   Ht 5\' 4"  (1.626 m)   Wt 243 lb 14.4 oz (110.6 kg)   LMP 10/13/2016   BMI 41.87 kg/m  or  Vitals:   07/20/17 2044 07/20/17 2101 07/20/17 2131 07/21/17 0101  BP: 128/76 128/75 122/87 125/82  Pulse: 99 93 94 97  Resp:      Temp:      TempSrc:      Weight:      Height:        SVE: Dilation: 2 Effacement (%): Thick Cervical Position: Posterior Station: -3 Presentation: Vertex Exam by:: Sally Bevacqua,DO   FHT: baseline rate 125, moderate varibility, +acel, no decel Toco: ctx q 2-5 min  Pitocin @ 67mu/min  Assessment / Plan: 32 y.o. G2P1001 at [redacted]w[redacted]d here for IOL for CHTN. TOLAC. Not in labor  Labor: Low dose pit for cervical ripening. FB in place.  CHTN: BPs stable  Fetal Wellbeing:  Cat I Pain Control:  IV pain meds; planning on epidural Anticipated MOD:  VBAC   [name], DO 07/21/2017, 1:57 AM

## 2017-07-21 NOTE — Progress Notes (Signed)
LABOR PROGRESS NOTE  Sally Garner is a 32 y.o. G2P1001 at [redacted]w[redacted]d  admitted for IOL for [facility]  Subjective: Patient reports feeling some contractions, but still not too painful  Objective: BP 124/85   Pulse (!) 101   Temp (!) 96.9 F (36.1 C) (Axillary)   Resp 20   Ht 5\' 4"  (1.626 m)   Wt 110.6 kg (243 lb 14.4 oz)   LMP 10/13/2016   BMI 41.87 kg/m  or  Vitals:   07/21/17 1201 07/21/17 1231 07/21/17 1301 07/21/17 1331  BP: 124/87 (!) 127/93 (!) 130/91 124/85  Pulse: 91 91 99 (!) 101  Resp: 18 20 18 20   Temp: (!) 96.9 F (36.1 C)     TempSrc: Axillary     Weight:      Height:       FB remains in place  FHT: baseline rate 125, moderate varibility, +acel, no decel Toco: ctx q 3-4 min  Assessment / Plan: 32 y.o. G2P1001 at [redacted]w[redacted]d here for IOL for [facility]  Labor: Titrating IV Pitocin now. FB still remains in place, now nearly 24 hours. Will recheck cervix in 1-2 hours if FB still in place to reassess Fetal Wellbeing:  Cat I Pain Control:  Per patient's request; not currently requiring any  meds Anticipated MOD:  Hopeful for VBAC  Gailen Shelter, MD 07/21/2017, 2:12 PM

## 2017-07-21 NOTE — Progress Notes (Signed)
Pitocin infusion bag replaced.

## 2017-07-21 NOTE — Progress Notes (Signed)
LABOR PROGRESS NOTE  Sally Garner is a 32 y.o. G2P1001 at [redacted]w[redacted]d  admitted for IOL for [facility]  Subjective: Sitting up in chair. No complaints  Objective: BP 125/79   Pulse (!) 103   Temp 98.1 F (36.7 C) (Oral)   Resp 18   Ht 5\' 4"  (1.626 m)   Wt 243 lb 14.4 oz (110.6 kg)   LMP 10/13/2016   BMI 41.87 kg/m  or  Vitals:   07/21/17 0832 07/21/17 0858 07/21/17 0905 07/21/17 0933  BP: 124/82  125/79   Pulse: 90  (!) 103   Resp: 20 18 20 18   Temp:      TempSrc:      Weight:      Height:         Dilation: 2 Effacement (%): Thick Cervical Position: Posterior Station: -3 Presentation: Vertex Exam by:: phelps FHT: 140, mdo var, accel present Uterine activity: 3-4 min  Labs: Lab Results  Component Value Date   WBC 10.1 07/20/2017   HGB 9.3 (L) 07/20/2017   HCT 28.8 (L) 07/20/2017   MCV 87.0 07/20/2017   PLT 304 07/20/2017    Patient Active Problem List   Diagnosis Date Noted  . Preexisting hypertension complicating pregnancy, antepartum 07/20/2017  . Pregnancy with poor obstetric history   . Obesity affecting pregnancy in third trimester   . HTN in pregnancy, chronic   . Unwanted fertility 03/23/2017  . Elevated hemoglobin A1c 01/03/2017  . Chronic hypertension during pregnancy, antepartum 01/02/2017  . Supervision of high risk pregnancy, antepartum 12/29/2016  . Pleuritic chest pain 07/12/2013  . Status post primary low transverse cesarean section--FTP, NRFHR 05/26/2013  . Anemia 05/24/2013  . Smoker 05/24/2013  . Fibroid uterus 05/24/2013  . Obesity, unspecified--BMI 37.1 05/24/2013  . Hx of primary hypertension--2-3 years ago 05/24/2013    Assessment / Plan: 32 y.o. G2P1001 at [redacted]w[redacted]d here for IOL for [facility]  HTN: normotensive, not on medication, cont to monitor Labor: progressing slowly, FB firmly in place > 12 hr, pitocin gtt on 10, will increase pitocin rate, monitor progress Fetal Wellbeing:  Cat 1 Pain Control:  supportive Anticipated MOD:   NSVD  Bonnita Hollow, MD PGY-1 5/31/20199:39 AM

## 2017-07-22 LAB — CBC
HCT: 29 % — ABNORMAL LOW (ref 36.0–46.0)
HEMOGLOBIN: 9.4 g/dL — AB (ref 12.0–15.0)
MCH: 28.1 pg (ref 26.0–34.0)
MCHC: 32.4 g/dL (ref 30.0–36.0)
MCV: 86.6 fL (ref 78.0–100.0)
PLATELETS: 281 10*3/uL (ref 150–400)
RBC: 3.35 MIL/uL — AB (ref 3.87–5.11)
RDW: 13.9 % (ref 11.5–15.5)
WBC: 9.7 10*3/uL (ref 4.0–10.5)

## 2017-07-22 NOTE — Progress Notes (Signed)
Labor Progress Note Sally Garner is a 32 y.o. G2P1001 at [redacted]w[redacted]d presented for IOL for [facility], TOLAC  S:  Felling some ctx, not painful, denies need for pain meds  O:  BP 124/82   Pulse 98   Temp 98 F (36.7 C) (Oral)   Resp 16   Ht 5\' 4"  (1.626 m)   Wt 243 lb 14.4 oz (110.6 kg)   LMP 10/13/2016   BMI 41.87 kg/m  EFM: baseline 145 bpm/ mod variability/ + accels/ no decels  Toco: q2-3 SVE: Dilation: 3.5 Effacement (%): 50 Cervical Position: Posterior Station: -3 Presentation: Vertex Exam by:: phelps Pitocin: 26 mu/min  A/P: 32 y.o. G2P1001 [redacted]w[redacted]d  1. Labor: prolonged latent phase 2. FWB: Cat I 3. Pain: analgesia/anesthesia prn  S/p foley, low dose Pitocin and Pitocin break. AROM clear, scant, IUPC placed. Titrate Pitocin for adequate labor. Guarded for VBAC.  Julianne Handler, CNM 8:49 AM

## 2017-07-22 NOTE — Progress Notes (Signed)
LABOR PROGRESS NOTE  Kyannah Climer is a 32 y.o. G2P1001 at [redacted]w[redacted]d  admitted for IOL for [facility]  Subjective: Resting. No concerns.  Objective: BP 125/83   Pulse 97   Temp 98 F (36.7 C) (Oral)   Resp 16   Ht 5\' 4"  (1.626 m)   Wt 110.6 kg (243 lb 14.4 oz)   LMP 10/13/2016   BMI 41.87 kg/m  or  Vitals:   07/22/17 0433 07/22/17 0501 07/22/17 0531 07/22/17 0601  BP: 133/90 (!) 128/97 126/83 125/83  Pulse: 96 (!) 101 (!) 103 97  Resp: 18 18 18 16   Temp: 98 F (36.7 C)     TempSrc: Oral     Weight:      Height:        Dilation: 3.5 Effacement (%): 50 Cervical Position: Posterior Station: -3 Presentation: Vertex Exam by:: Dimetri Armitage  FHT: baseline rate 135, moderate varibility, +acel, no decel Toco: ctx 3-5 min, regular  Pitocin @ 48mu/min  Assessment / Plan: 32 y.o. G2P1001 at [redacted]w[redacted]d here for IOL for [facility] TOLAC Not in labor.   Labor: Will continue to increase Pitocin then AROM.  CHTN: BPs stable Fetal Wellbeing:  Cat I Pain Control:  IV pain meds Anticipated MOD:  Hopeful for VBAC   [name], DO 07/22/2017, 6:34 AM

## 2017-07-22 NOTE — Progress Notes (Signed)
LABOR PROGRESS NOTE  Sally Garner is a 32 y.o. G2P1001 at [redacted]w[redacted]d  admitted for IOL for [facility]  Subjective: Resting  Objective: BP 129/73   Pulse (!) 104   Temp 98 F (36.7 C) (Oral)   Resp 16   Ht 5\' 4"  (1.626 m)   Wt 110.6 kg (243 lb 14.4 oz)   LMP 10/13/2016   BMI 41.87 kg/m  or  Vitals:   07/21/17 2330 07/22/17 0000 07/22/17 0031 07/22/17 0053  BP: 118/84 121/73 129/73   Pulse: (!) 103 92 (!) 104   Resp:      Temp:    98 F (36.7 C)  TempSrc:    Oral  Weight:      Height:        Dilation: 3.5 Effacement (%): 80 Cervical Position: Posterior Station: -3 Presentation: Vertex Exam by:: Sally Hail, RN  FHT: baseline rate 135, moderate varibility, +acel, no decel Toco: occasional ctx  Assessment / Plan: 32 y.o. G2P1001 at [redacted]w[redacted]d here for IOL for [facility] Not in labor.  Labor: Pit restarted at 2100. Patient not contracting; not in labor. Will continue to increase then AROM.  CHTN: BPs stable Fetal Wellbeing:  Cat I Pain Control:  IV pain meds Anticipated MOD:  Hopeful for VBAC   Sally Blare, DO 07/22/2017, 1:51 AM

## 2017-07-22 NOTE — Progress Notes (Signed)
Labor Progress Note Sally Garner is a 32 y.o. G2P1001 at [redacted]w[redacted]d presented for IOL for [facility]  S:  Patient comfortable in bed. States she has felt 1 contraction.  O:  BP (!) 145/91   Pulse 98   Temp 98.2 F (36.8 C) (Oral)   Resp 16   Ht 5\' 4"  (1.626 m)   Wt 243 lb 14.4 oz (110.6 kg)   LMP 10/13/2016   BMI 41.87 kg/m   Fetal Tracing:  Baseline: 140 Variability: moderate Accels: 15x15 Decels: none  Toco: 2-3 MVUs: 30-40  CVE: Dilation: 4 Effacement (%): 50 Cervical Position: Posterior Station: -2 Presentation: Vertex Exam by:: Rosana Hoes, RN    A&P: 32 y.o. G2P1001 [redacted]w[redacted]d  IOL for [facility] #Labor: Contractions still inadequate at this time. Titrating pitocin after pit break. Patient beginning to be discouraged but wants to continue trial of labor at this time. Requesting c/s in the morning if no cervical change.  #Pain: per patient request #FWB: Cat 1 #GBS negative  [name], CNM 8:40 PM

## 2017-07-22 NOTE — Progress Notes (Signed)
LABOR PROGRESS NOTE  Sally Garner is a 32 y.o. G2P1001 at [redacted]w[redacted]d  admitted for IOL for [facility], TOLAC Subjective: Pt is comfortable sitting on ball beside bed. No complaints  Objective: BP 114/75   Pulse 99   Temp 98.6 F (37 C) (Oral)   Resp 18   Ht 5\' 4"  (1.626 m)   Wt 243 lb 14.4 oz (110.6 kg)   LMP 10/13/2016   BMI 41.87 kg/m  or  Vitals:   07/22/17 1201 07/22/17 1231 07/22/17 1301 07/22/17 1331  BP: 128/83 122/84 126/84 114/75  Pulse: 98 97 (!) 101 99  Resp: 18   18  Temp:      TempSrc:      Weight:      Height:       Dilation: 3.5 Effacement (%): 50 Cervical Position: Posterior Station: -2 Presentation: Vertex Exam by:: dr. Wynetta Emery CNM FHT: BL 130, mod var,  Uterine activity: 160 MVU  Labs: Lab Results  Component Value Date   WBC 9.7 07/22/2017   HGB 9.4 (L) 07/22/2017   HCT 29.0 (L) 07/22/2017   MCV 86.6 07/22/2017   PLT 281 07/22/2017    Patient Active Problem List   Diagnosis Date Noted  . Preexisting hypertension complicating pregnancy, antepartum 07/20/2017  . Pregnancy with poor obstetric history   . Obesity affecting pregnancy in third trimester   . HTN in pregnancy, chronic   . Unwanted fertility 03/23/2017  . Elevated hemoglobin A1c 01/03/2017  . Chronic hypertension during pregnancy, antepartum 01/02/2017  . Supervision of high risk pregnancy, antepartum 12/29/2016  . Pleuritic chest pain 07/12/2013  . Status post primary low transverse cesarean section--FTP, NRFHR 05/26/2013  . Anemia 05/24/2013  . Smoker 05/24/2013  . Fibroid uterus 05/24/2013  . Obesity, unspecified--BMI 37.1 05/24/2013  . Hx of primary hypertension--2-3 years ago 05/24/2013    Assessment / Plan: 32 y.o. G2P1001 at [redacted]w[redacted]d here for IOL for [facility]  Labor: inadequite contractions, minimal progress, will trial pit break for 4 hr Fetal Wellbeing:  Cat I  Pain Control:  Supportive  Anticipated MOD:  NSVD  Bonnita Hollow, MD PGY-1 6/1/20191:45 PM

## 2017-07-23 ENCOUNTER — Inpatient Hospital Stay (HOSPITAL_COMMUNITY): Payer: Medicaid Other | Admitting: Anesthesiology

## 2017-07-23 ENCOUNTER — Encounter (HOSPITAL_COMMUNITY): Payer: Self-pay

## 2017-07-23 ENCOUNTER — Encounter (HOSPITAL_COMMUNITY)
Admission: RE | Disposition: A | Discharge status: Home / Self Care | Payer: Self-pay | Source: Home / Self Care | Attending: Family Medicine

## 2017-07-23 ENCOUNTER — Other Ambulatory Visit: Payer: Self-pay

## 2017-07-23 DIAGNOSIS — Z98891 History of uterine scar from previous surgery: Secondary | ICD-10-CM

## 2017-07-23 DIAGNOSIS — O480 Post-term pregnancy: Secondary | ICD-10-CM

## 2017-07-23 DIAGNOSIS — O34211 Maternal care for low transverse scar from previous cesarean delivery: Secondary | ICD-10-CM

## 2017-07-23 DIAGNOSIS — Z302 Encounter for sterilization: Secondary | ICD-10-CM

## 2017-07-23 DIAGNOSIS — Z9851 Tubal ligation status: Secondary | ICD-10-CM

## 2017-07-23 DIAGNOSIS — Z3A40 40 weeks gestation of pregnancy: Secondary | ICD-10-CM

## 2017-07-23 HISTORY — DX: History of uterine scar from previous surgery: Z98.891

## 2017-07-23 HISTORY — DX: Tubal ligation status: Z98.51

## 2017-07-23 LAB — COMPREHENSIVE METABOLIC PANEL
ALT: 8 U/L — ABNORMAL LOW (ref 14–54)
AST: 13 U/L — ABNORMAL LOW (ref 15–41)
Albumin: 2.3 g/dL — ABNORMAL LOW (ref 3.5–5.0)
Alkaline Phosphatase: 88 U/L (ref 38–126)
Anion gap: 10 (ref 5–15)
BUN: <5 mg/dL — ABNORMAL LOW (ref 6–20)
CO2: 21 mmol/L — ABNORMAL LOW (ref 22–32)
Calcium: 8.5 mg/dL — ABNORMAL LOW (ref 8.9–10.3)
Chloride: 106 mmol/L (ref 101–111)
Creatinine, Ser: 0.37 mg/dL — ABNORMAL LOW (ref 0.44–1.00)
GFR calc Af Amer: >60 mL/min (ref >60)
GFR calc non Af Amer: >60 mL/min (ref >60)
Glucose, Bld: 87 mg/dL (ref 65–99)
Potassium: 3.4 mmol/L — ABNORMAL LOW (ref 3.5–5.1)
Sodium: 137 mmol/L (ref 135–145)
Total Bilirubin: 0.5 mg/dL (ref 0.3–1.2)
Total Protein: 5.5 g/dL — ABNORMAL LOW (ref 6.5–8.1)

## 2017-07-23 LAB — CREATININE, SERUM
CREATININE: 0.43 mg/dL — AB (ref 0.44–1.00)
GFR calc Af Amer: >60 mL/min (ref >60)
GFR calc non Af Amer: >60 mL/min (ref >60)

## 2017-07-23 LAB — CBC WITH DIFFERENTIAL/PLATELET
Basophils Absolute: 0 10*3/uL (ref 0.0–0.1)
Basophils Relative: 0 %
EOS ABS: 0.2 10*3/uL (ref 0.0–0.7)
Eosinophils Relative: 1 %
HEMATOCRIT: 26.2 % — AB (ref 36.0–46.0)
HEMOGLOBIN: 8.5 g/dL — AB (ref 12.0–15.0)
LYMPHS ABS: 2.7 10*3/uL (ref 0.7–4.0)
Lymphocytes Relative: 22 %
MCH: 27.9 pg (ref 26.0–34.0)
MCHC: 32.4 g/dL (ref 30.0–36.0)
MCV: 85.9 fL (ref 78.0–100.0)
MONO ABS: 1.4 10*3/uL — AB (ref 0.1–1.0)
MONOS PCT: 11 %
NEUTROS PCT: 66 %
Neutro Abs: 8.1 10*3/uL — ABNORMAL HIGH (ref 1.7–7.7)
Platelets: 248 10*3/uL (ref 150–400)
RBC: 3.05 MIL/uL — ABNORMAL LOW (ref 3.87–5.11)
RDW: 14 % (ref 11.5–15.5)
WBC: 12.3 10*3/uL — ABNORMAL HIGH (ref 4.0–10.5)

## 2017-07-23 LAB — CBC
HCT: 24.1 % — ABNORMAL LOW (ref 36.0–46.0)
Hemoglobin: 7.8 g/dL — ABNORMAL LOW (ref 12.0–15.0)
MCH: 28.1 pg (ref 26.0–34.0)
MCHC: 32.4 g/dL (ref 30.0–36.0)
MCV: 86.7 fL (ref 78.0–100.0)
PLATELETS: 268 10*3/uL (ref 150–400)
RBC: 2.78 MIL/uL — AB (ref 3.87–5.11)
RDW: 14 % (ref 11.5–15.5)
WBC: 19.6 10*3/uL — ABNORMAL HIGH (ref 4.0–10.5)

## 2017-07-23 SURGERY — Surgical Case
Anesthesia: Epidural | Site: Abdomen | Wound class: Clean Contaminated

## 2017-07-23 MED ORDER — FENTANYL 2.5 MCG/ML BUPIVACAINE 1/10 % EPIDURAL INFUSION (WH - ANES)
14.0000 mL/h | INTRAMUSCULAR | Status: DC | PRN
  Administered 2017-07-23: 14 mL/h via EPIDURAL

## 2017-07-23 MED ORDER — BUPIVACAINE HCL (PF) 0.25 % IJ SOLN
INTRAMUSCULAR | Status: AC
  Filled 2017-07-23: qty 10

## 2017-07-23 MED ORDER — SCOPOLAMINE 1 MG/3DAYS TD PT72
MEDICATED_PATCH | TRANSDERMAL | Status: DC | PRN
  Administered 2017-07-23: 1 via TRANSDERMAL

## 2017-07-23 MED ORDER — PHENYLEPHRINE 40 MCG/ML (10ML) SYRINGE FOR IV PUSH (FOR BLOOD PRESSURE SUPPORT): 80.0000 ug | PREFILLED_SYRINGE | INTRAVENOUS | Status: DC | PRN

## 2017-07-23 MED ORDER — KETOROLAC TROMETHAMINE 30 MG/ML IJ SOLN
INTRAMUSCULAR | Status: AC
  Filled 2017-07-23: qty 1

## 2017-07-23 MED ORDER — PHENYLEPHRINE 8 MG IN D5W 100 ML (0.08MG/ML) PREMIX OPTIME
INJECTION | INTRAVENOUS | Status: AC
  Filled 2017-07-23: qty 100

## 2017-07-23 MED ORDER — METOCLOPRAMIDE HCL 5 MG/ML IJ SOLN
INTRAMUSCULAR | Status: DC | PRN
  Administered 2017-07-23: 10 mg via INTRAVENOUS

## 2017-07-23 MED ORDER — FENTANYL CITRATE (PF) 100 MCG/2ML IJ SOLN
INTRAMUSCULAR | Status: AC
  Filled 2017-07-23: qty 2

## 2017-07-23 MED ORDER — BUPIVACAINE HCL (PF) 0.25 % IJ SOLN
INTRAMUSCULAR | Status: AC
  Filled 2017-07-23: qty 20

## 2017-07-23 MED ORDER — LACTATED RINGERS IV SOLN: 500.0000 mL | Freq: Once | INTRAVENOUS | Status: DC

## 2017-07-23 MED ORDER — LACTATED RINGERS IV SOLN
INTRAVENOUS | Status: DC
  Administered 2017-07-23: 04:00:00 via INTRAUTERINE

## 2017-07-23 MED ORDER — SODIUM CHLORIDE 0.9% FLUSH: 3.0000 mL | INTRAVENOUS | Status: DC | PRN

## 2017-07-23 MED ORDER — NALOXONE HCL 0.4 MG/ML IJ SOLN: 0.4000 mg | INTRAMUSCULAR | Status: DC | PRN

## 2017-07-23 MED ORDER — IBUPROFEN 600 MG PO TABS
600.0000 mg | ORAL_TABLET | Freq: Four times a day (QID) | ORAL | Status: DC
  Administered 2017-07-23 – 2017-07-25 (×6): 600 mg via ORAL
  Filled 2017-07-23 (×6): qty 1

## 2017-07-23 MED ORDER — SODIUM CHLORIDE 0.9 % IV SOLN
500.0000 mg | Freq: Once | INTRAVENOUS | Status: AC
  Administered 2017-07-23: 500 mg via INTRAVENOUS
  Filled 2017-07-23: qty 500

## 2017-07-23 MED ORDER — ONDANSETRON HCL 4 MG/2ML IJ SOLN
INTRAMUSCULAR | Status: DC | PRN
  Administered 2017-07-23: 4 mg via INTRAVENOUS

## 2017-07-23 MED ORDER — LACTATED RINGERS IV SOLN
INTRAVENOUS | Status: DC
  Administered 2017-07-23: 18:00:00 via INTRAVENOUS

## 2017-07-23 MED ORDER — ZOLPIDEM TARTRATE 5 MG PO TABS
5.0000 mg | ORAL_TABLET | Freq: Every evening | ORAL | Status: DC | PRN
Start: 2017-07-23 — End: 2017-07-25

## 2017-07-23 MED ORDER — PROMETHAZINE HCL 25 MG/ML IJ SOLN
6.2500 mg | INTRAMUSCULAR | Status: DC | PRN
Start: 2017-07-23 — End: 2017-07-23

## 2017-07-23 MED ORDER — OXYCODONE HCL 5 MG PO TABS
10.0000 mg | ORAL_TABLET | ORAL | Status: DC | PRN
  Filled 2017-07-23: qty 2

## 2017-07-23 MED ORDER — WITCH HAZEL-GLYCERIN EX PADS: 1.0000 "application " | MEDICATED_PAD | CUTANEOUS | Status: DC | PRN

## 2017-07-23 MED ORDER — DEXAMETHASONE SODIUM PHOSPHATE 10 MG/ML IJ SOLN
INTRAMUSCULAR | Status: DC | PRN
  Administered 2017-07-23: 10 mg via INTRAVENOUS

## 2017-07-23 MED ORDER — COCONUT OIL OIL
1.0000 "application " | TOPICAL_OIL | Status: DC | PRN
  Administered 2017-07-25: 1 via TOPICAL
  Filled 2017-07-23 (×2): qty 120

## 2017-07-23 MED ORDER — ACETAMINOPHEN 325 MG PO TABS
650.0000 mg | ORAL_TABLET | ORAL | Status: DC | PRN
  Filled 2017-07-23 (×2): qty 2

## 2017-07-23 MED ORDER — METOCLOPRAMIDE HCL 5 MG/ML IJ SOLN
INTRAMUSCULAR | Status: AC
  Filled 2017-07-23: qty 2

## 2017-07-23 MED ORDER — SODIUM CHLORIDE 0.9 % IR SOLN
Status: DC | PRN
  Administered 2017-07-23: 100 mL

## 2017-07-23 MED ORDER — NALBUPHINE HCL 10 MG/ML IJ SOLN: 5.0000 mg | Freq: Once | INTRAMUSCULAR | Status: DC | PRN

## 2017-07-23 MED ORDER — PRENATAL MULTIVITAMIN CH
1.0000 | ORAL_TABLET | Freq: Every day | ORAL | Status: DC
  Administered 2017-07-24: 1 via ORAL
  Filled 2017-07-23 (×3): qty 1

## 2017-07-23 MED ORDER — HYDROMORPHONE HCL 1 MG/ML IJ SOLN: 0.2500 mg | INTRAMUSCULAR | Status: DC | PRN

## 2017-07-23 MED ORDER — DIBUCAINE 1 % RE OINT
1.0000 "application " | TOPICAL_OINTMENT | RECTAL | Status: DC | PRN
  Filled 2017-07-23: qty 28

## 2017-07-23 MED ORDER — NALBUPHINE HCL 10 MG/ML IJ SOLN: 5.0000 mg | INTRAMUSCULAR | Status: DC | PRN

## 2017-07-23 MED ORDER — SENNOSIDES-DOCUSATE SODIUM 8.6-50 MG PO TABS
2.0000 | ORAL_TABLET | ORAL | Status: DC
  Administered 2017-07-23 – 2017-07-25 (×2): 2 via ORAL
  Filled 2017-07-23 (×4): qty 2

## 2017-07-23 MED ORDER — LIDOCAINE HCL (PF) 1 % IJ SOLN
INTRAMUSCULAR | Status: DC | PRN
  Administered 2017-07-23: 5 mL via EPIDURAL

## 2017-07-23 MED ORDER — OXYTOCIN 10 UNIT/ML IJ SOLN
INTRAVENOUS | Status: DC | PRN
  Administered 2017-07-23: 40 [IU] via INTRAVENOUS

## 2017-07-23 MED ORDER — ENOXAPARIN SODIUM 40 MG/0.4ML ~~LOC~~ SOLN
40.0000 mg | SUBCUTANEOUS | Status: DC
  Administered 2017-07-24: 40 mg via SUBCUTANEOUS
  Filled 2017-07-23: qty 0.4

## 2017-07-23 MED ORDER — DIPHENHYDRAMINE HCL 25 MG PO CAPS
25.0000 mg | ORAL_CAPSULE | ORAL | Status: DC | PRN
  Filled 2017-07-23: qty 1

## 2017-07-23 MED ORDER — CEFAZOLIN SODIUM-DEXTROSE 2-4 GM/100ML-% IV SOLN
2.0000 g | Freq: Once | INTRAVENOUS | Status: AC
  Administered 2017-07-23: 2 g via INTRAVENOUS

## 2017-07-23 MED ORDER — NALOXONE HCL 4 MG/10ML IJ SOLN
1.0000 ug/kg/h | INTRAVENOUS | Status: DC | PRN
  Filled 2017-07-23: qty 5

## 2017-07-23 MED ORDER — SIMETHICONE 80 MG PO CHEW
80.0000 mg | CHEWABLE_TABLET | ORAL | Status: DC
  Administered 2017-07-23 – 2017-07-25 (×2): 80 mg via ORAL
  Filled 2017-07-23 (×4): qty 1

## 2017-07-23 MED ORDER — DIPHENHYDRAMINE HCL 50 MG/ML IJ SOLN: 12.5000 mg | INTRAMUSCULAR | Status: DC | PRN

## 2017-07-23 MED ORDER — TETANUS-DIPHTH-ACELL PERTUSSIS 5-2.5-18.5 LF-MCG/0.5 IM SUSP
0.5000 mL | Freq: Once | INTRAMUSCULAR | Status: DC
  Filled 2017-07-23: qty 0.5

## 2017-07-23 MED ORDER — MEPERIDINE HCL 25 MG/ML IJ SOLN: 6.2500 mg | INTRAMUSCULAR | Status: DC | PRN

## 2017-07-23 MED ORDER — MENTHOL 3 MG MT LOZG
1.0000 | LOZENGE | OROMUCOSAL | Status: DC | PRN
  Filled 2017-07-23: qty 9

## 2017-07-23 MED ORDER — OXYTOCIN 40 UNITS IN LACTATED RINGERS INFUSION - SIMPLE MED: 2.5000 [IU]/h | INTRAVENOUS | Status: AC

## 2017-07-23 MED ORDER — ONDANSETRON HCL 4 MG/2ML IJ SOLN: 4.0000 mg | Freq: Three times a day (TID) | INTRAMUSCULAR | Status: DC | PRN

## 2017-07-23 MED ORDER — SIMETHICONE 80 MG PO CHEW
80.0000 mg | CHEWABLE_TABLET | ORAL | Status: DC | PRN
  Filled 2017-07-23: qty 1

## 2017-07-23 MED ORDER — SODIUM BICARBONATE 8.4 % IV SOLN
INTRAVENOUS | Status: DC | PRN
  Administered 2017-07-23 (×2): 5 mL via EPIDURAL

## 2017-07-23 MED ORDER — MORPHINE SULFATE (PF) 0.5 MG/ML IJ SOLN
INTRAMUSCULAR | Status: DC | PRN
  Administered 2017-07-23: .5 mg via INTRAVENOUS
  Administered 2017-07-23: 4 mg via EPIDURAL
  Administered 2017-07-23: .5 mg via INTRAVENOUS

## 2017-07-23 MED ORDER — ONDANSETRON HCL 4 MG/2ML IJ SOLN
INTRAMUSCULAR | Status: AC
  Filled 2017-07-23: qty 2

## 2017-07-23 MED ORDER — OXYCODONE HCL 5 MG PO TABS
5.0000 mg | ORAL_TABLET | ORAL | Status: DC | PRN
  Filled 2017-07-23: qty 1

## 2017-07-23 MED ORDER — BUPIVACAINE HCL (PF) 0.25 % IJ SOLN
INTRAMUSCULAR | Status: DC | PRN
  Administered 2017-07-23: 30 mL

## 2017-07-23 MED ORDER — MORPHINE SULFATE (PF) 0.5 MG/ML IJ SOLN
INTRAMUSCULAR | Status: AC
  Filled 2017-07-23: qty 10

## 2017-07-23 MED ORDER — EPHEDRINE 5 MG/ML INJ: 10.0000 mg | INTRAVENOUS | Status: DC | PRN

## 2017-07-23 MED ORDER — SIMETHICONE 80 MG PO CHEW
80.0000 mg | CHEWABLE_TABLET | Freq: Three times a day (TID) | ORAL | Status: DC
  Administered 2017-07-24 – 2017-07-25 (×4): 80 mg via ORAL
  Filled 2017-07-23 (×9): qty 1

## 2017-07-23 MED ORDER — KETOROLAC TROMETHAMINE 30 MG/ML IJ SOLN
30.0000 mg | Freq: Once | INTRAMUSCULAR | Status: AC | PRN
  Administered 2017-07-23: 30 mg via INTRAVENOUS

## 2017-07-23 MED ORDER — SCOPOLAMINE 1 MG/3DAYS TD PT72: 1.0000 | MEDICATED_PATCH | Freq: Once | TRANSDERMAL | Status: DC

## 2017-07-23 MED ORDER — CEFAZOLIN SODIUM-DEXTROSE 2-4 GM/100ML-% IV SOLN
INTRAVENOUS | Status: AC
  Filled 2017-07-23: qty 100

## 2017-07-23 MED ORDER — OXYTOCIN 10 UNIT/ML IJ SOLN
INTRAMUSCULAR | Status: AC
Start: 2017-07-23 — End: ?
  Filled 2017-07-23: qty 4

## 2017-07-23 MED ORDER — DIPHENHYDRAMINE HCL 25 MG PO CAPS
25.0000 mg | ORAL_CAPSULE | Freq: Four times a day (QID) | ORAL | Status: DC | PRN
  Filled 2017-07-23: qty 1

## 2017-07-23 MED ORDER — LACTATED RINGERS IV SOLN
INTRAVENOUS | Status: DC | PRN
  Administered 2017-07-23: 12:00:00 via INTRAVENOUS

## 2017-07-23 SURGICAL SUPPLY — 34 items
BENZOIN TINCTURE PRP APPL 2/3 (GAUZE/BANDAGES/DRESSINGS) ×3 IMPLANT
CHLORAPREP W/TINT 26ML (MISCELLANEOUS) ×3 IMPLANT
CLAMP CORD UMBIL (MISCELLANEOUS) IMPLANT
CLOSURE STERI STRIP 1/2 X4 (GAUZE/BANDAGES/DRESSINGS) ×3 IMPLANT
CLOTH BEACON ORANGE TIMEOUT ST (SAFETY) ×3 IMPLANT
DRSG OPSITE POSTOP 4X10 (GAUZE/BANDAGES/DRESSINGS) ×3 IMPLANT
ELECT REM PT RETURN 9FT ADLT (ELECTROSURGICAL) ×3
ELECTRODE REM PT RTRN 9FT ADLT (ELECTROSURGICAL) ×1 IMPLANT
EXTRACTOR VACUUM M CUP 4 TUBE (SUCTIONS) IMPLANT
EXTRACTOR VACUUM M CUP 4' TUBE (SUCTIONS)
GLOVE BIOGEL PI IND STRL 7.0 (GLOVE) ×2 IMPLANT
GLOVE BIOGEL PI INDICATOR 7.0 (GLOVE) ×4
GLOVE ECLIPSE 7.0 STRL STRAW (GLOVE) ×6 IMPLANT
GOWN STRL REUS W/TWL LRG LVL3 (GOWN DISPOSABLE) ×6 IMPLANT
KIT ABG SYR 3ML LUER SLIP (SYRINGE) IMPLANT
KIT STERILIZATION PERM ADIANA (Clip) ×3 IMPLANT
NEEDLE HYPO 22GX1.5 SAFETY (NEEDLE) ×3 IMPLANT
NEEDLE HYPO 25X5/8 SAFETYGLIDE (NEEDLE) IMPLANT
NS IRRIG 1000ML POUR BTL (IV SOLUTION) ×3 IMPLANT
PACK C SECTION WH (CUSTOM PROCEDURE TRAY) ×3 IMPLANT
PAD ABD 7.5X8 STRL (GAUZE/BANDAGES/DRESSINGS) ×3 IMPLANT
PAD OB MATERNITY 4.3X12.25 (PERSONAL CARE ITEMS) ×3 IMPLANT
PENCIL SMOKE EVAC W/HOLSTER (ELECTROSURGICAL) ×3 IMPLANT
RTRCTR C-SECT PINK 25CM LRG (MISCELLANEOUS) ×3 IMPLANT
SPONGE COVER 4X4 NONSTERILE (GAUZE/BANDAGES/DRESSINGS) ×3 IMPLANT
SPONGE GAUZE 4X4 12PLY STER LF (GAUZE/BANDAGES/DRESSINGS) ×3 IMPLANT
STRIP CLOSURE SKIN 1/2X4 (GAUZE/BANDAGES/DRESSINGS) ×2 IMPLANT
SUT PLAIN 2 0 XLH (SUTURE) ×3 IMPLANT
SUT VIC AB 0 CTX 36 (SUTURE) ×6
SUT VIC AB 0 CTX36XBRD ANBCTRL (SUTURE) ×3 IMPLANT
SUT VIC AB 4-0 KS 27 (SUTURE) ×3 IMPLANT
SYR 30ML LL (SYRINGE) ×3 IMPLANT
TOWEL OR 17X24 6PK STRL BLUE (TOWEL DISPOSABLE) ×3 IMPLANT
TRAY FOLEY W/BAG SLVR 14FR LF (SET/KITS/TRAYS/PACK) ×3 IMPLANT

## 2017-07-23 NOTE — Op Note (Signed)
Preoperative Diagnosis:  IUP @ [redacted]w[redacted]d, FTP: arrest of dilation, unwanted fertility  Postoperative Diagnosis:  Same  Procedure: Repeat low transverse cesarean section, Bilateral Tubal Ligation with Filshie clips  Surgeon: Darron Doom, M.D.  Assistant: [name], DO  Anesthesia: spinal with Nolon Nations, MD  Findings: Viable female infant, APGAR (1 MIN): 3  APGAR (5 MINS): 8  Weight pending 1hr skin to skin. Normal tubes and ovaries  Estimated blood loss: 1610 cc  Complications: None known  Specimens: Placenta to Pathology with thick meconium staining   Reason for procedure: Briefly, the patient is a 32 y.o. [id number] at [redacted]w[redacted]d with h/o previous C-section who desires permanent sterility.  Patient counseled, r.e. Risks benefits of BTL, including permanency of procedure, risk of failure(1:100), increased risk of ectopic.  Patient verbalized understanding and desires to proceed  Procedure: Patient to the OR where epidural was dosed up to surgical level. She was then placed in a supine position with left lateral tilt. She received 2 g of Ancef and 500mg  of Zithromax. SCDs were in place. A Foley catheter was placed in the bladder. She was prepped and draped in the usual sterile fashion. A timeout was performed. A knife was then used to make a Pfannenstiel incision. This incision was carried out to underlying fascia which was divided in the midline with the knife. The incision was extended laterally, sharply. The fascia was dissected of the underlying rectus superiorly.  The rectus was divided in the midline.  The peritoneal cavity was entered bluntly.  Alexis retractor was placed inside the incision.  A knife was used to make a low transverse incision on the uterus. This incision was carried down to the amniotic cavity was entered. Fetus was in cephalic position and was brought up out of the incision without difficulty. Cord was clamped x 2 and cut. Infant taken to waiting pediatrician.  Cord blood  gas was obtained. Placenta was delivered from the uterus.  Uterus was cleaned with dry lap pads. Uterine incision closed with 0 Vicryl suture in a locked running fashion. Figure of eight sutures used to help with further hemostasis. Attention was turned to the pt's left tube which was grasped with a Babcock clamp and followed to its fimbriated end.  A Filshie clip was placed across the tube 1.5 cm from the cornu.  Attention was turned to the pt's right tube which was grasped with a Babcock clamp and followed to its fimbriated end.  A Filshie clip was placed across the tube 1.5 cm from the cornu. A 1cc of 0.25% Marcaine was injected into the surrounding tubes bilaterally.  Alexis retractor was removed from the abdomen. Peritoneal closure was done with 0 Vicryl suture. Fascia was closed with 0 Vicryl suture in a running fashion. Subcutaneous tissue infused with 30cc 0.25% Marcaine.  Subcutaneous closure was performed with 0 Plain suture.  Skin closed using 3-0 Vicryl on a Keith needle.  Steri strips applied, followed by pressure dressing.  All instrument, needle and lap counts were correct x 2.  Patient was awake and taken to PACU stable.  Infant to Newborn Nursery, stable.   [name], DO [facility] for [facility], [facility]

## 2017-07-23 NOTE — Progress Notes (Signed)
Labor Progress Note Serafina Topham is a 32 y.o. G2P1001 at [redacted]w[redacted]d presented for IOL for [facility]  S:  Patient sleeping  O:  BP 124/80   Pulse (!) 106   Temp 97.7 F (36.5 C) (Axillary)   Resp 20   Ht 5\' 4"  (1.626 m)   Wt 243 lb 14.4 oz (110.6 kg)   LMP 10/13/2016   SpO2 99%   BMI 41.87 kg/m   Fetal Tracing:  Baseline: 125 Variability: moderate Accels: 15x15 Decels: none  Toco: every 1 minute  CVE: Dilation: 5 Effacement (%): 90 Cervical Position: Posterior Station: -2 Presentation: Vertex Exam by:: Len Blalock, CNM   A&P: 32 y.o. G2P1001 [redacted]w[redacted]d IOL for [facility]N, TOLAC #Labor: Slight change in cervical dilation. Patient tachysytolic, given fluid bolus and pitocin reduced. Patient requesting repeat c/s due to lack of progress. Dr. Elly Modena updated and patient made NPO. #Pain: epidural #FWB: Cat 1 #GBS negative  [name], CNM 7:15 AM

## 2017-07-23 NOTE — Transfer of Care (Signed)
Immediate Anesthesia Transfer of Care Note  Patient: Sally Garner  Procedure(s) Performed: CESAREAN SECTION (N/A Abdomen)  Patient Location: PACU  Anesthesia Type:Epidural  Level of Consciousness: awake, alert  and oriented  Airway & Oxygen Therapy: Patient Spontanous Breathing  Post-op Assessment: Report given to RN and Post -op Vital signs reviewed and stable  Post vital signs: Reviewed and stable  Last Vitals:  Vitals Value Taken Time  BP    Temp    Pulse 121 07/23/2017 12:54 PM  Resp 25 07/23/2017 12:54 PM  SpO2 97 % 07/23/2017 12:54 PM  Vitals shown include unvalidated device data.  Last Pain:  Vitals:   07/23/17 1030  TempSrc: Oral  PainSc:       Patients Stated Pain Goal: 7 (39/68/86 [id number])  Complications: No apparent anesthesia complications

## 2017-07-23 NOTE — Anesthesia Preprocedure Evaluation (Addendum)
Anesthesia Evaluation  Patient identified by MRN, date of birth, ID band Patient awake    Reviewed: Allergy & Precautions, Patient's Chart, lab work & pertinent test results  Airway Mallampati: II       Dental no notable dental hx.    Pulmonary Current Smoker,    Pulmonary exam normal        Cardiovascular hypertension, Normal cardiovascular exam Rhythm:Regular     Neuro/Psych  Headaches,    GI/Hepatic negative GI ROS, Neg liver ROS,   Endo/Other  negative endocrine ROS  Renal/GU      Musculoskeletal   Abdominal   Peds  Hematology  (+) anemia ,   Anesthesia Other Findings   Reproductive/Obstetrics                             Anesthesia Physical Anesthesia Plan  ASA: III  Anesthesia Plan: Epidural   Post-op Pain Management:    Induction:   PONV Risk Score and Plan:   Airway Management Planned: Natural Airway  Additional Equipment:   Intra-op Plan:   Post-operative Plan:   Informed Consent: I have reviewed the patients History and Physical, chart, labs and discussed the procedure including the risks, benefits and alternatives for the proposed anesthesia with the patient or authorized representative who has indicated his/her understanding and acceptance.     Plan Discussed with:   Anesthesia Plan Comments: (Lab Results      Component                Value               Date                      WBC                      12.3 (H)            07/23/2017                HGB                      8.5 (L)             07/23/2017                HCT                      26.2 (L)            07/23/2017                MCV                      85.9                07/23/2017                PLT                      248                 07/23/2017           )        Anesthesia Quick Evaluation

## 2017-07-23 NOTE — Progress Notes (Signed)
Lactation at bedside. °

## 2017-07-23 NOTE — Progress Notes (Signed)
Labor Progress Note Sally Garner is a 32 y.o. G2P1001 at [redacted]w[redacted]d presented for IOL for [facility]  S:  Patient reporting sharp vaginal pain with contractions. IUPC fell out with movement.   O:  BP 122/84   Pulse (!) 103   Temp 97.7 F (36.5 C) (Axillary)   Resp 18   Ht 5\' 4"  (1.626 m)   Wt 243 lb 14.4 oz (110.6 kg)   LMP 10/13/2016   SpO2 99%   BMI 41.87 kg/m   Fetal Tracing:  Baseline: 130 Variability: moderate Accels: 15x15 Decels: variable  Toco: 1-2  CVE: Dilation: 4 Effacement (%): 90 Cervical Position: Posterior Station: -2 Presentation: Vertex Exam by:: Sally Garner, CNM   A&P: 32 y.o. G2P1001 [redacted]w[redacted]d IOL for CHTN #Labor: IUPC replaced. Amnioinfusion ordered due to repeated variables with contractions. Patient tolerated exam without pain. Will consider epidural redose if pain continues. Continue pitocin and position changes.  #Pain: epidural #FWB: Cat 1 #GBS negative   Sally Garner, CNM 5:50 AM

## 2017-07-23 NOTE — Progress Notes (Signed)
Labor Progress Note Sally Garner is a 32 y.o. G2P1001 at [redacted]w[redacted]d presented for IOL for [facility].  S:  Patient is ready for a c-section. She does not want to continue with induction. Now day 4 of induction.   O:  BP 125/75   Pulse (!) 113   Temp 98.1 F (36.7 C) (Oral)   Resp 16   Ht 5\' 4"  (1.626 m)   Wt 110.6 kg (243 lb 14.4 oz)   LMP 10/13/2016   SpO2 99%   BMI 41.87 kg/m   Fetal Tracing:  Baseline: 135 Variability: moderate Accels: 15x15 Decels: none  Toco: every 2 minute  CVE: Dilation: 5 Effacement (%): 90 Cervical Position: Posterior Station: -2 Presentation: Vertex Exam by:: Len Blalock, CNM   A&P: 32 y.o. G2P1001 [redacted]w[redacted]d IOL for CHTN, TOLAC. Arrest of labor. Failure to progress: Arrest of dilation and descent. Failed induction Refusing to continue with induction Desires BTL  #Labor: Not in active labor. Induction for 4 days with minimal changes. Patient refusing to continue with induction. Patient requesting repeat c/s due to lack of progress. #Pain: epidural #FWB: Cat 1 #ID: GBS negative ; surgical ppx with Ancef and Azithromax prior to OR.  #Mode of delivery: Plan on RCS with BTL. The risks of cesarean section were discussed with the patient including but were not limited to: bleeding which may require transfusion or reoperation; infection which may require antibiotics; injury to bowel, bladder, ureters or other surrounding organs; injury to the fetus; need for additional procedures including hysterectomy in the event of a life-threatening hemorrhage; placental abnormalities wth subsequent pregnancies, incisional problems, thromboembolic phenomenon and other postoperative/anesthesia complications.  Patient also desires permanent sterilization. Other reversible forms of contraception were discussed with patient; she declines all other modalities. Risks of procedure discussed with patient including but not limited to: risk of regret, permanence of method, bleeding,  infection, injury to surrounding organs and need for additional procedures.  Failure risk of 1-2% with increased risk of ectopic gestation if pregnancy occurs was also discussed with patient. The patient concurred with the proposed plan, giving informed written consent for the procedure. Patient has been NPO since last night she will remain NPO for procedure. Anesthesia and OR aware. Preoperative prophylactic antibiotics and SCDs ordered on call to the OR. To OR when ready.   [name], DO 8:43 AM

## 2017-07-23 NOTE — Lactation Note (Signed)
This note was copied from a baby's chart. Lactation Consultation Note  Patient Name: Sally Garner [id number] Date: 07/23/2017 Reason for consult: Initial assessment;Term;Difficult latch  RN called from PACU for lactation assistance. 2 hours old FT female who is currently being BF by his mother, she does plan to supplement with formula at some point though, that was her feeding choice upon admission. Mom is  P2 and experienced with BF, she was able to BF her first baby for 12 months but she did supplement early in life as soon as she got home.   Mom participated in the [facility] program for this pregnancy, and plans to sign up again tomorrow. She requested a DEBP pump to take home, explained to mom that we can only issue hand pumps unless she decides to rent one. Discussed options to get a DEBP through her local [facility] office in [facility]. Mom will need to start double pumping ASAP while here at the hospital since she'll be on a NS.  Mom has inverted nipples; both still looking intact upon examination; she used a NS with her first baby, PACU RN requested [facility] assistance for NS fitting due to baby's difficult latch. Mom is currently on a # 20 NS. Baby was having his medical exam by MD when entering the PACU room, offered assistance with latch and baby was able to latch on immediately with a NS # 20. No swallows were heard though, but baby was sucking rhythmically and very vigorously.   LC showed mom how to hand express from the other breast and colostrum was already leaking. PACU RN noted that mom has been leaking the whole time she was there. Baby still nursing when exiting the room, mom will need to be followed up by lactation to complete education about handouts; unable to do it at this point, mom was falling asleep. Mom and dad aware of [facility] services and will call PRN.  Maternal Data Formula Feeding for Exclusion: Yes Reason for exclusion: Mother's choice to formula and breast feed on admission Has  patient been taught Hand Expression?: Yes Does the patient have breastfeeding experience prior to this delivery?: Yes  Feeding Feeding Type: Breast Fed Length of feed: 10 min(baby still nursing when exiting PACU)  LATCH Score Latch: Grasps breast easily, tongue down, lips flanged, rhythmical sucking.(With NS # 20)  Audible Swallowing: None(with NS # 20)  Type of Nipple: Inverted  Comfort (Breast/Nipple): Soft / non-tender  Hold (Positioning): Assistance needed to correctly position infant at breast and maintain latch.  LATCH Score: 5  Interventions Interventions: Breast feeding basics reviewed;Assisted with latch;Skin to skin;Breast massage;Hand express;Breast compression;Adjust position;Support pillows;Position options;Expressed milk  Lactation Tools Discussed/Used Tools: Nipple Shields Nipple shield size: 20 WIC Program: Yes   Consult Status Consult Status: Follow-up Date: 07/23/17 Follow-up type: In-patient    Sally Garner 07/23/2017, 2:12 PM

## 2017-07-23 NOTE — Progress Notes (Signed)
Labor Progress Note Sally Garner is a 32 y.o. G2P1001 at [redacted]w[redacted]d presented for IOL for CHTN  S:  Patient reporting painful contractions, requesting epdiural  O:  BP 131/89   Pulse 94   Temp 97.7 F (36.5 C) (Oral)   Resp 18   Ht 5\' 4"  (1.626 m)   Wt 243 lb 14.4 oz (110.6 kg)   LMP 10/13/2016   SpO2 99%   BMI 41.87 kg/m   Fetal Tracing:  Baseline: 130 Variability: moderate Accels: 15x15 Decels: none  Toco: 1-3  CVE: Dilation: 4 Effacement (%): 90 Cervical Position: Posterior Station: -2 Presentation: Vertex Exam by:: Len Blalock, CNM   A&P: 32 y.o. G2P1001 [redacted]w[redacted]d IOL CHTN #Labor: Cervix thinning. Forebag palpated. Discussed with patient AROM of forebag and patient agreeable. AROM with moderate amount of thick, particulate meconium stained fluid. Will continue pitocin. #Pain: epidural #FWB: Cat 1 #GBS negative  [name], CNM 3:01 AM

## 2017-07-23 NOTE — Progress Notes (Signed)
Patient requesting repeat C/S.  States "she is tired of this process".  Encouragement and education given.  Oriented to process of C/S.  CNM notified.

## 2017-07-23 NOTE — Progress Notes (Addendum)
POC for C/S initiated. Consent obtained for repeat C/S and BTL.  30 day papers signed in chart.

## 2017-07-23 NOTE — Anesthesia Procedure Notes (Signed)
Epidural Patient location during procedure: OB Start time: 07/23/2017 2:11 AM End time: 07/23/2017 2:17 AM  Staffing Anesthesiologist: Effie Berkshire, MD Performed: anesthesiologist   Preanesthetic Checklist Completed: patient identified, site marked, surgical consent, pre-op evaluation, timeout performed, IV checked, risks and benefits discussed and monitors and equipment checked  Epidural Patient position: sitting Prep: ChloraPrep Patient monitoring: heart rate, continuous pulse ox and blood pressure Approach: midline Location: L3-L4 Injection technique: LOR saline  Needle:  Needle type: Tuohy  Needle gauge: 17 G Needle length: 9 cm Catheter type: closed end flexible Catheter size: 20 Guage Test dose: negative and 1.5% lidocaine  Assessment Events: blood not aspirated, injection not painful, no injection resistance and no paresthesia  Additional Notes LOR @ 6  Patient identified. Risks/Benefits/Options discussed with patient including but not limited to bleeding, infection, nerve damage, paralysis, failed block, incomplete pain control, headache, blood pressure changes, nausea, vomiting, reactions to medications, itching and postpartum back pain. Confirmed with bedside nurse the patient's most recent platelet count. Confirmed with patient that they are not currently taking any anticoagulation, have any bleeding history or any family history of bleeding disorders. Patient expressed understanding and wished to proceed. All questions were answered. Sterile technique was used throughout the entire procedure. Please see nursing notes for vital signs. Test dose was given through epidural catheter and negative prior to continuing to dose epidural or start infusion. Warning signs of high block given to the patient including shortness of breath, tingling/numbness in hands, complete motor block, or any concerning symptoms with instructions to call for help. Patient was given instructions on  fall risk and not to get out of bed. All questions and concerns addressed with instructions to call with any issues or inadequate analgesia.    Reason for block:procedure for pain

## 2017-07-24 ENCOUNTER — Encounter (HOSPITAL_COMMUNITY): Payer: Self-pay

## 2017-07-24 LAB — CBC
HEMATOCRIT: 20.9 % — AB (ref 36.0–46.0)
HEMOGLOBIN: 7 g/dL — AB (ref 12.0–15.0)
MCH: 28.8 pg (ref 26.0–34.0)
MCHC: 33.5 g/dL (ref 30.0–36.0)
MCV: 86 fL (ref 78.0–100.0)
Platelets: 268 10*3/uL (ref 150–400)
RBC: 2.43 MIL/uL — ABNORMAL LOW (ref 3.87–5.11)
RDW: 14.2 % (ref 11.5–15.5)
WBC: 23.1 10*3/uL — ABNORMAL HIGH (ref 4.0–10.5)

## 2017-07-24 LAB — BPAM RBC
BLOOD PRODUCT EXPIRATION DATE: 201906282359
Blood Product Expiration Date: 201906282359
ISSUE DATE / TIME: 201906021110
ISSUE DATE / TIME: 201906021110
UNIT TYPE AND RH: 5100
Unit Type and Rh: 5100

## 2017-07-24 LAB — TYPE AND SCREEN
ABO/RH(D): O POS
Antibody Screen: NEGATIVE
UNIT DIVISION: 0
UNIT DIVISION: 0

## 2017-07-24 MED ORDER — ENOXAPARIN SODIUM 60 MG/0.6ML ~~LOC~~ SOLN
0.5000 mg/kg | SUBCUTANEOUS | Status: DC
  Administered 2017-07-25: 55 mg via SUBCUTANEOUS
  Filled 2017-07-24: qty 0.6

## 2017-07-24 MED ORDER — AMLODIPINE BESYLATE 10 MG PO TABS
10.0000 mg | ORAL_TABLET | Freq: Every day | ORAL | Status: DC
  Administered 2017-07-24 – 2017-07-25 (×2): 10 mg via ORAL
  Filled 2017-07-24 (×2): qty 1

## 2017-07-24 MED ORDER — FERROUS SULFATE 325 (65 FE) MG PO TABS
325.0000 mg | ORAL_TABLET | Freq: Every day | ORAL | Status: DC
  Administered 2017-07-24 – 2017-07-25 (×2): 325 mg via ORAL
  Filled 2017-07-24 (×2): qty 1

## 2017-07-24 NOTE — Progress Notes (Addendum)
Subjective: Postpartum Day 1: Cesarean Delivery Patient reports tolerating PO and + flatus.  Has not tried to void.   Objective: Vital signs in last 24 hours: Temp:  [98.1 F (36.7 C)-99.8 F (37.7 C)] 98.4 F (36.9 C) (06/03 0031) Pulse Rate:  [84-129] 105 (06/03 0031) Resp:  [16-30] 18 (06/03 0031) BP: (108-149)/(75-108) 149/89 (06/03 0031) SpO2:  [93 %-100 %] 100 % (06/03 0031)  Physical Exam:  General: alert, cooperative and no distress Lochia: appropriate Uterine Fundus: firm Incision: healing well, no significant drainage, no dehiscence, no significant erythema DVT Evaluation: No evidence of DVT seen on physical exam.  Recent Labs    07/23/17 0127 07/23/17 1809  HGB 8.5* 7.8*  HCT 26.2* 24.1*    Assessment/Plan: Status post Cesarean section. Doing well postoperatively.  Will start oral iron for anemia. Continue current care.  Bonnita Hollow 07/24/2017, 6:32 AM

## 2017-07-24 NOTE — Anesthesia Postprocedure Evaluation (Signed)
Anesthesia Post Note  Patient: Sally Garner  Procedure(s) Performed: CESAREAN SECTION (N/A Abdomen)     Patient location during evaluation: Mother Baby Anesthesia Type: Epidural Level of consciousness: awake and alert Pain management: pain level controlled Vital Signs Assessment: post-procedure vital signs reviewed and stable Respiratory status: spontaneous breathing, nonlabored ventilation and respiratory function stable Cardiovascular status: stable Postop Assessment: no headache, no backache and epidural receding Anesthetic complications: no    Last Vitals:  Vitals:   07/24/17 0031 07/24/17 0810  BP: (!) 149/89 128/82  Pulse: (!) 105 92  Resp: 18 18  Temp: 36.9 C 36.6 C  SpO2: 100%     Last Pain:  Vitals:   07/24/17 0810  TempSrc: Oral  PainSc: 6    Pain Goal: Patients Stated Pain Goal: 7 (07/20/17 1725)               [name]

## 2017-07-25 MED ORDER — OXYCODONE HCL 5 MG PO TABS: 5.0000 mg | ORAL_TABLET | ORAL | 0 refills | Status: AC | PRN

## 2017-07-25 MED ORDER — AMLODIPINE BESYLATE 10 MG PO TABS
10.0000 mg | ORAL_TABLET | Freq: Every day | ORAL | 1 refills | Status: DC
Start: 2017-07-25 — End: 2020-01-10

## 2017-07-25 MED ORDER — FERROUS SULFATE 325 (65 FE) MG PO TABS: 325.0000 mg | ORAL_TABLET | Freq: Three times a day (TID) | ORAL | 3 refills | Status: DC

## 2017-07-25 NOTE — Progress Notes (Signed)
D/c instructions discussed, signed, & given.  Prescription medications reviewed to include indications, frequency, & side effect.  Pt aware to use mom/baby care booklet for reference as needed.  Pt d/c home in stable condition with infant and family.

## 2017-07-25 NOTE — Progress Notes (Signed)
   07/25/17 0958  Bowel Sounds  RUQ Bowel Sounds Audible  LUQ Bowel Sounds Faint  RLQ Bowel Sounds Audible  LLQ Bowel Sounds Absent  Dr Lambert Keto called re: above assessment to include adm is non-tender & non-distended & that pt reports + flatus.  No new orders rec'd at this time.

## 2017-07-25 NOTE — Discharge Summary (Signed)
OB Discharge Summary     Patient Name: Sally Garner DOB: 29-Apr-1985 MRN: [id number]  Date of admission: 07/20/2017 Delivering MD: Donnamae Jude   Date of discharge: 07/25/2017  Admitting diagnosis: INDUCTION Intrauterine pregnancy: [redacted]w[redacted]d     Secondary diagnosis:  Active Problems:   Preexisting hypertension complicating pregnancy, antepartum   Status post repeat low transverse cesarean section   Status post tubal ligation  Additional problems: iron deficiency anemia      Discharge diagnosis: Term Pregnancy Delivered, CHTN and Anemia                                                                                                Post partum procedures:postpartum tubal ligation  Augmentation: AROM, Pitocin and Foley Balloon  Complications: None  Hospital course:  Induction of Labor With Cesarean Section  32 y.o. yo G2P2002 at [redacted]w[redacted]d was admitted to the hospital 07/20/2017 for induction of labor. Patient had a labor course significant for cHTN. The patient went for cesarean section due to Failure to progress, and delivered a Viable infant,07/23/2017  Membrane Rupture Time/Date: 8:47 AM ,07/22/2017   Details of operation can be found in separate operative Note.  Patient had an uncomplicated postpartum course. She is ambulating, tolerating a regular diet, passing flatus, and urinating well.  Patient is discharged home in stable condition on 07/25/17.                                    Physical exam  Vitals:   07/24/17 1212 07/24/17 1400 07/24/17 1836 07/25/17 0645  BP: 138/84  136/87 124/75  Pulse:   (!) 131 (!) 112  Resp:   20 18  Temp:   98.4 F (36.9 C) 98.2 F (36.8 C)  TempSrc:   Oral Oral  SpO2:   100% 100%  Weight:  243 lb 13.3 oz (110.6 kg)    Height:  5\' 4"  (1.626 m)     General: alert, cooperative and no distress Lochia: appropriate Uterine Fundus: firm Incision: Healing well with no significant drainage DVT Evaluation: No evidence of DVT seen on physical exam. No  cords or calf tenderness. No significant calf/ankle edema. Labs: Lab Results  Component Value Date   WBC 23.1 (H) 07/24/2017   HGB 7.0 (L) 07/24/2017   HCT 20.9 (L) 07/24/2017   MCV 86.0 07/24/2017   PLT 268 07/24/2017   CMP Latest Ref Rng & Units 07/23/2017  Glucose 65 - 99 mg/dL -  BUN 6 - 20 mg/dL -  Creatinine 0.44 - 1.00 mg/dL 0.43(L)  Sodium 135 - 145 mmol/L -  Potassium 3.5 - 5.1 mmol/L -  Chloride 101 - 111 mmol/L -  CO2 22 - 32 mmol/L -  Calcium 8.9 - 10.3 mg/dL -  Total Protein 6.5 - 8.1 g/dL -  Total Bilirubin 0.3 - 1.2 mg/dL -  Alkaline Phos 38 - 126 U/L -  AST 15 - 41 U/L -  ALT 14 - 54 U/L -    Discharge instruction: per After Visit Summary and "  Baby and Me Booklet".  After visit meds:  Allergies as of 07/25/2017   No Known Allergies     Medication List    TAKE these medications   amLODipine 10 MG tablet Commonly known as:  NORVASC Take 1 tablet (10 mg total) by mouth daily.   calcium carbonate 500 MG chewable tablet Commonly known as:  TUMS - dosed in mg elemental calcium Chew 1 tablet by mouth 2 (two) times daily as needed for indigestion or heartburn.   ferrous sulfate 325 (65 FE) MG tablet Take 1 tablet (325 mg total) by mouth 3 (three) times daily with meals.   oxyCODONE 5 MG immediate release tablet Commonly known as:  Oxy IR/ROXICODONE Take 1 tablet (5 mg total) by mouth every 4 (four) hours as needed for up to 5 days (pain scale 4-7).   prenatal multivitamin Tabs tablet Take 1 tablet by mouth daily at 12 noon.       Diet: routine diet  Activity: Advance as tolerated. Pelvic rest for 6 weeks.   Outpatient follow up:1 week for BP check and 2 week for incision check  Follow up Appt: Future Appointments  Date Time Provider [place]  07/31/2017 10:00 AM [place] None  08/07/2017  1:45 PM Constant, Vickii Chafe, MD [place] None  08/21/2017  1:00 PM Constant, Peggy, MD [place] None   Follow up Visit:No follow-ups on  file.  Postpartum contraception: Tubal Ligation done PP  Newborn Data: Live born female  Birth Weight: 7 lb 8.3 oz (3410 g) APGAR: 3, 8  Newborn Delivery   Birth date/time:  07/23/2017 11:57:00 Delivery type:  C-Section, Vacuum Assisted Trial of labor:  Yes C-section categorization:  Repeat     Baby Feeding: Bottle and Breast Disposition:home with mother   07/25/2017 Lajean Manes, CNM

## 2017-07-27 ENCOUNTER — Inpatient Hospital Stay (HOSPITAL_COMMUNITY)
Admission: AD | Admit: 2017-07-27 | Discharge: 2017-07-27 | Disposition: A | Discharge status: Home / Self Care | Payer: Medicaid Other | Source: Ambulatory Visit | Attending: Obstetrics and Gynecology | Admitting: Obstetrics and Gynecology

## 2017-07-27 ENCOUNTER — Encounter (HOSPITAL_COMMUNITY): Payer: Self-pay | Admitting: Emergency Medicine

## 2017-07-27 DIAGNOSIS — I10 Essential (primary) hypertension: Secondary | ICD-10-CM | POA: Diagnosis not present

## 2017-07-27 DIAGNOSIS — F17210 Nicotine dependence, cigarettes, uncomplicated: Secondary | ICD-10-CM | POA: Insufficient documentation

## 2017-07-27 DIAGNOSIS — Z9851 Tubal ligation status: Secondary | ICD-10-CM

## 2017-07-27 DIAGNOSIS — R102 Pelvic and perineal pain: Secondary | ICD-10-CM | POA: Diagnosis present

## 2017-07-27 DIAGNOSIS — N939 Abnormal uterine and vaginal bleeding, unspecified: Secondary | ICD-10-CM | POA: Diagnosis present

## 2017-07-27 DIAGNOSIS — Z8249 Family history of ischemic heart disease and other diseases of the circulatory system: Secondary | ICD-10-CM | POA: Diagnosis not present

## 2017-07-27 DIAGNOSIS — O10919 Unspecified pre-existing hypertension complicating pregnancy, unspecified trimester: Secondary | ICD-10-CM

## 2017-07-27 DIAGNOSIS — Z98891 History of uterine scar from previous surgery: Secondary | ICD-10-CM

## 2017-07-27 DIAGNOSIS — D62 Acute posthemorrhagic anemia: Secondary | ICD-10-CM | POA: Diagnosis not present

## 2017-07-27 LAB — CBC
HCT: 17.1 % — ABNORMAL LOW (ref 36.0–46.0)
Hemoglobin: 5.7 g/dL — CL (ref 12.0–15.0)
MCH: 28.2 pg (ref 26.0–34.0)
MCHC: 33.3 g/dL (ref 30.0–36.0)
MCV: 84.7 fL (ref 78.0–100.0)
PLATELETS: 272 10*3/uL (ref 150–400)
RBC: 2.02 MIL/uL — AB (ref 3.87–5.11)
RDW: 14.4 % (ref 11.5–15.5)
WBC: 13.4 10*3/uL — AB (ref 4.0–10.5)

## 2017-07-27 NOTE — MAU Note (Signed)
Pt talked with provider and was advised to come back if she had increased vaginal bleeding. Pt didn't want a blood transfusion. Provider told her that if she began to feel bad or dizzy or lightheaded to come back in and we would treat her for her low hgb. Pt left after talking with the provider before I could give her discharge instructions.

## 2017-07-27 NOTE — MAU Provider Note (Signed)
Chief Complaint:  Vaginal Bleeding   First Provider Initiated Contact with Patient 07/27/17 0150      HPI: Sally Garner is a 32 y.o. G2P2002 who presents to maternity admissions reporting passing some small blood clots tonight.  Not bleeding heavily between clots.  Has incisional pain, but not more than before. . She reports vaginal bleeding, but no vaginal itching/burning, urinary symptoms, h/a, dizziness, n/v, or fever/chills.    No dizziness or light-headedness with normal activity. Taking iron for anemia noted on discharge. Did not have transfusion.  Vaginal Bleeding  The patient's primary symptoms include pelvic pain (incisional) and vaginal bleeding. The patient's pertinent negatives include no genital itching, genital lesions or genital odor. This is a recurrent problem. The problem occurs intermittently. The problem has been unchanged. The pain is moderate. The problem affects both sides. She is not pregnant. Associated symptoms include abdominal pain. Pertinent negatives include no constipation, diarrhea, fever, headaches, nausea or vomiting. The vaginal discharge was bloody. The vaginal bleeding is lighter than menses. She has been passing clots. She has not been passing tissue. Nothing aggravates the symptoms. She has tried nothing for the symptoms.   RN Note: Pt here with c/o passing blood clots tonight. Had a C/S on 07/23/17.    Past Medical History: Past Medical History:  Diagnosis Date  . Hypertension    was put on medication end of 03/2015; states has caused headache and is not taking as prescribed  . Migraines   . Symptomatic cholelithiasis 04/2015    Past obstetric history: OB History  Gravida Para Term Preterm AB Living  2 2 2     2   SAB TAB Ectopic Multiple Live Births        0 2    # Outcome Date GA Lbr Len/2nd Weight Sex Delivery Anes PTL Lv  2 Term 07/23/17 [redacted]w[redacted]d  7 lb 8.3 oz (3.41 kg) M CS-Vac EPI  LIV  1 Term 05/25/13 [redacted]w[redacted]d  8 lb 10 oz (3.912 kg) F  CS-LTranv EPI  LIV     Complications: Failure to Progress in First Stage    Past Surgical History: Past Surgical History:  Procedure Laterality Date  . CESAREAN SECTION N/A 05/25/2013   Procedure: CESAREAN SECTION;  Surgeon: Annalee Genta, DO;  Location: [facility];  Service: Obstetrics;  Laterality: N/A;  . CESAREAN SECTION N/A 07/23/2017   Procedure: CESAREAN SECTION;  Surgeon: Donnamae Jude, MD;  Location: [facility];  Service: Obstetrics;  Laterality: N/A;  . CHOLECYSTECTOMY N/A 05/05/2015   Procedure: LAPAROSCOPIC CHOLECYSTECTOMY;  Surgeon: Coralie Keens, MD;  Location: [facility];  Service: General;  Laterality: N/A;    Family History: Family History  Problem Relation Age of Onset  . Hypertension Maternal Aunt   . Hypertension Maternal Uncle   . Birth defects Brother        polydactyl  . Cancer Paternal Aunt        breast cancer    Social History: Social History   Tobacco Use  . Smoking status: Current Some Day Smoker    Packs/day: 0.50    Years: 3.00    Pack years: 1.50    Types: Cigarettes  . Smokeless tobacco: Never Used  Substance Use Topics  . Alcohol use: Yes    Comment: occasional  . Drug use: No    Allergies: No Known Allergies  Meds:  No medications prior to admission.    I have reviewed patient's Past Medical Hx, Surgical Hx, Family Hx,  Social Hx, medications and allergies.  ROS:  Review of Systems  Constitutional: Negative for fever.  Gastrointestinal: Positive for abdominal pain. Negative for constipation, diarrhea, nausea and vomiting.  Genitourinary: Positive for pelvic pain (incisional) and vaginal bleeding.  Neurological: Negative for headaches.   Other systems negative  Physical Exam   Patient Vitals for the past 24 hrs:  BP Temp Temp src Pulse Resp SpO2 Height Weight  07/27/17 0123 133/79 98.6 F (37 C) Oral (!) 109 18 100 % 5\' 4"  (1.626 m) 251 lb (113.9 kg)  Orthostatic VS: Orthostatic VS for the past 24  hrs:  BP- Lying Pulse- Lying BP- Sitting Pulse- Sitting BP- Standing at 0 minutes Pulse- Standing at 0 minutes  07/27/17 0207 - - - - 139/89 113  07/27/17 0206 - - 131/84 107 - -  07/27/17 0204 132/81 109 - - - -    Constitutional: Well-developed, well-nourished female in no acute distress.  Cardiovascular: normal rate and rhythm, no ectopy audible, S1 & S2 heard, no murmur Respiratory: normal effort, no distress. Lungs CTAB with no wheezes or crackles GI: Abd soft, appropriately tender.  Nondistended.  No rebound, No guarding.  Dressing intact MS: Extremities nontender, no edema, normal ROM Neurologic: Alert and oriented x 4.   Grossly nonfocal. GU: Neg CVAT. Skin:  Warm and Dry Psych:  Affect appropriate.  PELVIC EXAM: Small amount of bleeding on pad.  No clots appreciated.  No active flow or hemorrhage. Pelvic deferred due to recent C/S and small amount of bleeding.  Clots passed at home were small and only happened once or twice.    Labs: Results for orders placed or performed during the hospital encounter of 07/27/17 (from the past 24 hour(s))  CBC     Status: Abnormal   Collection Time: 07/27/17  2:02 AM  Result Value Ref Range   WBC 13.4 (H) 4.0 - 10.5 K/uL   RBC 2.02 (L) 3.87 - 5.11 MIL/uL   Hemoglobin 5.7 (LL) 12.0 - 15.0 g/dL   HCT 17.1 (L) 36.0 - 46.0 %   MCV 84.7 78.0 - 100.0 fL   MCH 28.2 26.0 - 34.0 pg   MCHC 33.3 30.0 - 36.0 g/dL   RDW 14.4 11.5 - 15.5 %   Platelets 272 150 - 400 K/uL    Ref. Range 07/22/2017 09:50 07/23/2017 00:00 07/23/2017 01:27 07/23/2017 18:09 07/24/2017 05:28  Hemoglobin Latest Ref Range: 12.0 - 15.0 g/dL 9.4 (L)  8.5 (L) 7.8 (L) 7.0 (L)   --/--/O POS (05/30 3846)  Imaging:  none  MAU Course/MDM: I have ordered labs as follows: CBC Imaging ordered: none Results reviewed. Hemoglobin is lower than at discharge.  Orthostatic vital signs are stable without variance.    Patient states she is not symptomatic at home.  States feels good.     Consult Dr Glo Herring.  He states we could offer transfusion, but inlight of her stability and lack of orthostasis or significant fatigue, we could proceed expectantly and not transfuse right now.  I discussed this with patient.  She prefers to avoid transfusion for now.  I admonished her to come back right away or call clinic if she develops any fatigue, activity intolerance or dizziness. At that point we would likely insist on transfusion. Marland Kitchen    Has BP check on 6/10 and incision check on 6/17   Pt stable at time of discharge.  Assessment: Postoperative cesarean delivery x 4 days.  Postpartum bleeding with clots, no hemorrhage Anemia, blood loss.  Plan: Discharge home Recommend good hydration, activity limitation Has Rx for iron 3x/day Strict return precautions  Follow-up [facility] Follow up.   Specialty:  Obstetrics and Gynecology Contact information: [address] [place] [phone]          Encouraged to return here or to other Urgent Care/ED if she develops worsening of symptoms, increase in pain, fever, or other concerning symptoms.   Hansel Feinstein CNM, MSN Certified Nurse-Midwife 07/27/2017 5:58 AM

## 2017-07-27 NOTE — Discharge Instructions (Signed)
Postpartum Hemorrhage Postpartum hemorrhage is excessive blood loss after childbirth. Vaginal bleeding after delivery is normal and should be expected. Bleeding (lochia) will occur for several days after childbirth. This can be expected with normal vaginal deliveries and cesarean deliveries. However, postpartum hemorrhage is a potentially serious condition. What are the causes? This condition is caused by:  A loss of muscle tone in the uterus after childbirth. This can be caused by: ? An abnormal placenta. ? Infection. ? Bladder swelling (distension).  Failure to deliver all of the placenta or the retention of clots.  Wounds in the birth canal caused by delivery of the fetus.  Infection of the uterus.  Infection of tissue around the fetus.  A tear in the uterus.  Tearing of the vagina or cervix during delivery.  A maternal bleeding disorder that prevents blood from clotting (rare).  What increases the risk? This condition is likely to develop in people who:  Have a history of postpartum hemorrhage.  Had a delivery that lasted longer than usual.  Have an excess of amniotic fluid in the amniotic sac (polyhydramnios), leading the uterus to stretch too much.  Have delivered quintuplets or more babies.  Had high blood pressure, seizures, or coma during pregnancy.  Had a condition called preeclampsia or eclampsia during pregnancy.  Had problems with the placenta.  Had complications during labor or delivery.  Are obese.  Are 32 years old or older.  Are Asian or Hispanic.  What are the signs or symptoms? Symptoms of this condition include:  Passing large clots or pieces of tissue. These may be small pieces of placenta left after delivery.  Soaking more than one sanitary pad per hour for several hours.  Heavy, bright-red bleeding that occurs 4 days or more after delivery.  Discharge that has a bad smell.  An unexplained fever.  Nausea or vomiting.  Pain or  swelling near the vagina or perineum.  A drop in blood pressure.  Lightheadedness or fainting.  Shortness of breath.  A fast heart rate that happens with very little activity.  Signs of shock, such as: ? Blurry vision. ? Chills. ? Dizziness. ? Weakness.  How is this diagnosed? This condition may be diagnosed based on:  Your symptoms.  A physical exam of your perineum, vagina, cervix, and uterus.  Tests, including: ? Blood pressure and pulse measurements. ? Blood tests. ? Blood clotting tests. ? Ultrasonography.  How is this treated? Treatment for this condition depends on the severity of your symptoms. It may include:  Uterine massage.  Medicines to help the uterus contract.  Blood transfusions.  Fluids given through the vein.  A medical procedure to compress arteries supplying the uterus.  Sometimes bleeding occurs if portions of the placenta are left behind in the uterus after delivery. If this happens, a curettage or scraping of the inside of the uterus must be done (rare). This usually stops the bleeding. If curettage does not stop the bleeding, surgery may be done to remove the uterus (hysterectomy), but this rarely occurs. If bleeding is due to clotting or bleeding problems that are not related to the pregnancy, other treatments may be needed. Follow these instructions at home:  Limit your activity as directed by your health care provider.Your health care provider may order bed rest (getting up to go to the bathroom only) or may allow you to continue light activity.  Keep track of the number of pads you use each day and how soaked (saturated) they are. Write down  this information.  Do not use tampons.  Do not douche or have sexual intercourse until your health care provider approves.  Drink enough fluids to keep your urine clear or pale yellow.  Get enough rest.  Eat foods that are rich in iron, such as spinach, red meat, and legumes.  Take any  over-the-counter and prescription medicines only as told by your health care provider.  Keep all follow-up visits as told by your health care provider. This is important. Get help right away if:  You experience severe cramps in your stomach, back, or abdomen.  You have a fever.  You pass large clots or tissue. Save any tissue for your health care provider to look at.  Your bleeding increases.  You have heavy bleeding that soaks one pad per hour for 2 hours in a row.  You faint or become dizzy, weak, or lightheaded.  Your sanitary pad count per hour is increasing.  You are urinating less than usual or not at all.  You have shortness of breath.  Your heart rate is faster than usual.  You have sudden chest pain. This information is not intended to replace advice given to you by your health care provider. Make sure you discuss any questions you have with your health care provider. Document Released: 04/30/2003 Document Revised: 10/07/2015 Document Reviewed: 09/11/2015 Elsevier Interactive Patient Education  [employer].

## 2017-07-27 NOTE — MAU Note (Signed)
Pt here with c/o passing blood clots tonight. Had a C/S on 07/23/17.

## 2017-07-31 ENCOUNTER — Ambulatory Visit: Payer: Medicaid Other

## 2017-07-31 VITALS — BP 142/103 | HR 102

## 2017-07-31 DIAGNOSIS — O165 Unspecified maternal hypertension, complicating the puerperium: Secondary | ICD-10-CM

## 2017-07-31 NOTE — Progress Notes (Signed)
Pt is here for PP blood pressure check. Pt states she did not take her amlodipine this morning. Advised pt to take her medication and come back either tomorrow or Wednesday for another BP check per Dr. Roselie Awkward. Pt verbalized understanding and said she would make appointment for Wednesday.

## 2017-08-02 ENCOUNTER — Telehealth: Payer: Self-pay | Admitting: *Deleted

## 2017-08-02 NOTE — Telephone Encounter (Signed)
Received call from [facility]. She state that pt BP today is 136/82, pt is taking Amlodipine daily.   Call placed to pt. Advised to continue on medication as prescribed. Pt states she takes it daily. Pt made aware of upcoming appt and made aware she can discuss any changes at that time.  Pt states understanding.

## 2017-08-07 ENCOUNTER — Ambulatory Visit (INDEPENDENT_AMBULATORY_CARE_PROVIDER_SITE_OTHER): Payer: Medicaid Other | Admitting: Obstetrics and Gynecology

## 2017-08-07 ENCOUNTER — Encounter: Payer: Self-pay | Admitting: Obstetrics and Gynecology

## 2017-08-07 VITALS — BP 133/90 | HR 98 | Ht 64.0 in | Wt 223.7 lb

## 2017-08-07 DIAGNOSIS — Z5189 Encounter for other specified aftercare: Secondary | ICD-10-CM

## 2017-08-07 NOTE — Progress Notes (Signed)
32 yo G2P2 s/p c/section 07/23/2016 due to FTP here for incisional check. Patient reports feeling well. Pain is minimal. Patient reports foul odor from incision on occasions. She denies fever or abnormal incisional drainage  Past Medical History:  Diagnosis Date  . Hypertension    was put on medication end of 03/2015; states has caused headache and is not taking as prescribed  . Migraines   . Symptomatic cholelithiasis 04/2015   Past Surgical History:  Procedure Laterality Date  . CESAREAN SECTION N/A 05/25/2013   Procedure: CESAREAN SECTION;  Surgeon: Annalee Genta, DO;  Location: [facility];  Service: Obstetrics;  Laterality: N/A;  . CESAREAN SECTION N/A 07/23/2017   Procedure: CESAREAN SECTION;  Surgeon: Donnamae Jude, MD;  Location: [place];  Service: Obstetrics;  Laterality: N/A;  . CHOLECYSTECTOMY N/A 05/05/2015   Procedure: LAPAROSCOPIC CHOLECYSTECTOMY;  Surgeon: Coralie Keens, MD;  Location: [place];  Service: General;  Laterality: N/A;   Family History  Problem Relation Age of Onset  . Hypertension Maternal Aunt   . Hypertension Maternal Uncle   . Birth defects Brother        polydactyl  . Cancer Paternal Aunt        breast cancer   Social History   Tobacco Use  . Smoking status: Current Some Day Smoker    Packs/day: 0.50    Years: 3.00    Pack years: 1.50    Types: Cigarettes  . Smokeless tobacco: Never Used  Substance Use Topics  . Alcohol use: Yes    Comment: occasional  . Drug use: No   ROS See pertinent in HPI  Blood pressure 133/90, pulse 98, height 5\' 4"  (1.626 m), weight 223 lb 11.2 oz (101.5 kg), last menstrual period 10/13/2016, currently breastfeeding.  GENERAL: Well-developed, well-nourished female in no acute distress.  ABDOMEN: Soft, nontender, nondistended. No organomegaly. Incision: no erythema, induration or drainage. Incision is healing well EXTREMITIES: No cyanosis, clubbing, or edema, 2+ distal pulses.  A/P 32 yo  here for incision check - Reassurance provided - wound care discussed - RTC in July as scheduled for pp visit - BP well controlled on amlodipine

## 2017-08-07 NOTE — Progress Notes (Signed)
Presents for Incision Check. C/o incision having a foul smell.

## 2017-08-21 ENCOUNTER — Ambulatory Visit: Payer: Medicaid Other | Admitting: Obstetrics and Gynecology

## 2017-08-23 ENCOUNTER — Ambulatory Visit (INDEPENDENT_AMBULATORY_CARE_PROVIDER_SITE_OTHER): Payer: Medicaid Other | Admitting: Obstetrics

## 2017-08-23 ENCOUNTER — Encounter: Payer: Self-pay | Admitting: Obstetrics

## 2017-08-23 ENCOUNTER — Other Ambulatory Visit: Payer: Self-pay

## 2017-08-23 DIAGNOSIS — O10919 Unspecified pre-existing hypertension complicating pregnancy, unspecified trimester: Secondary | ICD-10-CM

## 2017-08-23 DIAGNOSIS — Z1389 Encounter for screening for other disorder: Secondary | ICD-10-CM | POA: Diagnosis not present

## 2017-08-23 NOTE — Progress Notes (Signed)
Subjective:     Sally Garner is a 32 y.o. female who presents for a postpartum visit. She is 4 weeks postpartum following a low cervical transverse Cesarean section. I have fully reviewed the prenatal and intrapartum course. The delivery was at 40.3 gestational weeks. Outcome: repeat cesarean section, low transverse incision. Anesthesia: epidural. Postpartum course has been Unremarkable. Baby's course has been Unremarkable. Baby is feeding by both breast and bottle - Gerber Soy. Bleeding thin lochia. Bowel function is normal. Bladder function is normal. Patient is not sexually active. Contraception method is tubal ligation. Postpartum depression screening: negative.  The following portions of the patient's history were reviewed and updated as appropriate: allergies, current medications, past family history, past medical history, past social history, past surgical history and problem list.  Review of Systems A comprehensive review of systems was negative.   Objective:    BP 118/84   Pulse 85   Wt 217 lb (98.4 kg)   LMP 10/13/2016   Breastfeeding? Yes   BMI 37.25 kg/m   General:  alert and no distress   Breasts:  inspection negative, no nipple discharge or bleeding, no masses or nodularity palpable  Lungs: clear to auscultation bilaterally  Heart:  regular rate and rhythm, S1, S2 normal, no murmur, click, rub or gallop  Abdomen: soft, non-tender; bowel sounds normal; no masses,  no organomegaly and incision clean, dry and intact.   Assessment:     1. Postpartum care following cesarean delivery - doing well  2. Chronic hypertension during pregnancy, antepartum - stable  Plan:    1. Contraception: tubal ligation 2. Continue PNV's 3. Follow up in: 6 weeks or as needed.     Shelly Bombard MD 08-23-2017

## 2017-08-23 NOTE — Patient Instructions (Signed)
 Contraception Choices Contraception, also called birth control, refers to methods or devices that prevent pregnancy. Hormonal methods Contraceptive implant A contraceptive implant is a thin, plastic tube that contains a hormone. It is inserted into the upper part of the arm. It can remain in place for up to 3 years. Progestin-only injections Progestin-only injections are injections of progestin, a synthetic form of the hormone progesterone. They are given every 3 months by a health care provider. Birth control pills Birth control pills are pills that contain hormones that prevent pregnancy. They must be taken once a day, preferably at the same time each day. Birth control patch The birth control patch contains hormones that prevent pregnancy. It is placed on the skin and must be changed once a week for three weeks and removed on the fourth week. A prescription is needed to use this method of contraception. Vaginal ring A vaginal ring contains hormones that prevent pregnancy. It is placed in the vagina for three weeks and removed on the fourth week. After that, the process is repeated with a new ring. A prescription is needed to use this method of contraception. Emergency contraceptive Emergency contraceptives prevent pregnancy after unprotected sex. They come in pill form and can be taken up to 5 days after sex. They work best the sooner they are taken after having sex. Most emergency contraceptives are available without a prescription. This method should not be used as your only form of birth control. Barrier methods Female condom A female condom is a thin sheath that is worn over the penis during sex. Condoms keep sperm from going inside a woman's body. They can be used with a spermicide to increase their effectiveness. They should be disposed after a single use. Female condom A female condom is a soft, loose-fitting sheath that is put into the vagina before sex. The condom keeps sperm from  going inside a woman's body. They should be disposed after a single use. Diaphragm A diaphragm is a soft, dome-shaped barrier. It is inserted into the vagina before sex, along with a spermicide. The diaphragm blocks sperm from entering the uterus, and the spermicide kills sperm. A diaphragm should be left in the vagina for 6-8 hours after sex and removed within 24 hours. A diaphragm is prescribed and fitted by a health care provider. A diaphragm should be replaced every 1-2 years, after giving birth, after gaining more than 15 lb (6.8 kg), and after pelvic surgery. Cervical cap A cervical cap is a round, soft latex or plastic cup that fits over the cervix. It is inserted into the vagina before sex, along with spermicide. It blocks sperm from entering the uterus. The cap should be left in place for 6-8 hours after sex and removed within 48 hours. A cervical cap must be prescribed and fitted by a health care provider. It should be replaced every 2 years. Sponge A sponge is a soft, circular piece of polyurethane foam with spermicide on it. The sponge helps block sperm from entering the uterus, and the spermicide kills sperm. To use it, you make it wet and then insert it into the vagina. It should be inserted before sex, left in for at least 6 hours after sex, and removed and thrown away within 30 hours. Spermicides Spermicides are chemicals that kill or block sperm from entering the cervix and uterus. They can come as a cream, jelly, suppository, foam, or tablet. A spermicide should be inserted into the vagina with an applicator at least 10-15 minutes   sex to allow time for it to work. The process must be repeated every time you have sex. Spermicides do not require a prescription. Intrauterine contraception Intrauterine device (IUD) An IUD is a T-shaped device that is put in a woman's uterus. There are two types:  Hormone IUD.This type contains progestin, a synthetic form of the hormone progesterone. This  type can stay in place for 3-5 years.  Copper IUD.This type is wrapped in copper wire. It can stay in place for 10 years.  Permanent methods of contraception Female tubal ligation In this method, a woman's fallopian tubes are sealed, tied, or blocked during surgery to prevent eggs from traveling to the uterus. Hysteroscopic sterilization In this method, a small, flexible insert is placed into each fallopian tube. The inserts cause scar tissue to form in the fallopian tubes and block them, so sperm cannot reach an egg. The procedure takes about 3 months to be effective. Another form of birth control must be used during those 3 months. Female sterilization This is a procedure to tie off the tubes that carry sperm (vasectomy). After the procedure, the man can still ejaculate fluid (semen). Natural planning methods Natural family planning In this method, a couple does not have sex on days when the woman could become pregnant. Calendar method This means keeping track of the length of each menstrual cycle, identifying the days when pregnancy can happen, and not having sex on those days. Ovulation method In this method, a couple avoids sex during ovulation. Symptothermal method This method involves not having sex during ovulation. The woman typically checks for ovulation by watching changes in her temperature and in the consistency of cervical mucus. Post-ovulation method In this method, a couple waits to have sex until after ovulation. Summary  Contraception, also called birth control, means methods or devices that prevent pregnancy.  Hormonal methods of contraception include implants, injections, pills, patches, vaginal rings, and emergency contraceptives.  Barrier methods of contraception can include female condoms, female condoms, diaphragms, cervical caps, sponges, and spermicides.  There are two types of IUDs (intrauterine devices). An IUD can be put in a woman's uterus to prevent pregnancy  for 3-5 years.  Permanent sterilization can be done through a procedure for males, females, or both.  Natural family planning methods involve not having sex on days when the woman could become pregnant. This information is not intended to replace advice given to you by your health care provider. Make sure you discuss any questions you have with your health care provider. Document Released: 02/07/2005 Document Revised: 03/12/2016 Document Reviewed: 03/12/2016 Elsevier Interactive Patient Education  2018 [employer].

## 2017-10-04 ENCOUNTER — Ambulatory Visit: Payer: Medicaid Other | Admitting: Obstetrics

## 2017-10-17 ENCOUNTER — Emergency Department (HOSPITAL_COMMUNITY)
Admission: EM | Admit: 2017-10-17 | Discharge: 2017-10-17 | Disposition: A | Discharge status: Home / Self Care | Payer: Medicaid Other | Attending: Emergency Medicine | Admitting: Emergency Medicine

## 2017-10-17 ENCOUNTER — Emergency Department (HOSPITAL_COMMUNITY): Payer: Medicaid Other

## 2017-10-17 ENCOUNTER — Other Ambulatory Visit: Payer: Self-pay

## 2017-10-17 ENCOUNTER — Encounter (HOSPITAL_COMMUNITY): Payer: Self-pay

## 2017-10-17 DIAGNOSIS — I10 Essential (primary) hypertension: Secondary | ICD-10-CM | POA: Diagnosis not present

## 2017-10-17 DIAGNOSIS — F17210 Nicotine dependence, cigarettes, uncomplicated: Secondary | ICD-10-CM | POA: Insufficient documentation

## 2017-10-17 DIAGNOSIS — R079 Chest pain, unspecified: Secondary | ICD-10-CM | POA: Insufficient documentation

## 2017-10-17 LAB — BASIC METABOLIC PANEL
ANION GAP: 5 (ref 5–15)
BUN: 7 mg/dL (ref 6–20)
CO2: 27 mmol/L (ref 22–32)
Calcium: 9.1 mg/dL (ref 8.9–10.3)
Chloride: 107 mmol/L (ref 98–111)
Creatinine, Ser: 0.59 mg/dL (ref 0.44–1.00)
GFR calc Af Amer: >60 mL/min (ref >60)
GLUCOSE: 96 mg/dL (ref 70–99)
POTASSIUM: 4 mmol/L (ref 3.5–5.1)
Sodium: 139 mmol/L (ref 135–145)

## 2017-10-17 LAB — I-STAT BETA HCG BLOOD, ED (MC, WL, AP ONLY): I-stat hCG, quantitative: <5 m[IU]/mL (ref <5)

## 2017-10-17 LAB — I-STAT TROPONIN, ED
TROPONIN I, POC: 0 ng/mL (ref 0.00–0.08)
Troponin i, poc: 0 ng/mL (ref 0.00–0.08)

## 2017-10-17 LAB — CBC
HEMATOCRIT: 32 % — AB (ref 36.0–46.0)
HEMOGLOBIN: 10 g/dL — AB (ref 12.0–15.0)
MCH: 24.4 pg — AB (ref 26.0–34.0)
MCHC: 31.3 g/dL (ref 30.0–36.0)
MCV: 78 fL (ref 78.0–100.0)
Platelets: 469 10*3/uL — ABNORMAL HIGH (ref 150–400)
RBC: 4.1 MIL/uL (ref 3.87–5.11)
RDW: 15.5 % (ref 11.5–15.5)
WBC: 8 10*3/uL (ref 4.0–10.5)

## 2017-10-17 LAB — D-DIMER, QUANTITATIVE (NOT AT ARMC): D DIMER QUANT: 0.68 ug{FEU}/mL — AB (ref 0.00–0.50)

## 2017-10-17 LAB — LIPASE, BLOOD: LIPASE: 29 U/L (ref 11–51)

## 2017-10-17 MED ORDER — METHOCARBAMOL 500 MG PO TABS: 500.0000 mg | ORAL_TABLET | Freq: Two times a day (BID) | ORAL | 0 refills | Status: DC

## 2017-10-17 MED ORDER — IOPAMIDOL (ISOVUE-370) INJECTION 76%
INTRAVENOUS | Status: AC
  Filled 2017-10-17: qty 100

## 2017-10-17 MED ORDER — KETOROLAC TROMETHAMINE 30 MG/ML IJ SOLN
30.0000 mg | Freq: Once | INTRAMUSCULAR | Status: AC
  Administered 2017-10-17: 30 mg via INTRAVENOUS
  Filled 2017-10-17: qty 1

## 2017-10-17 MED ORDER — AMLODIPINE BESYLATE 5 MG PO TABS
5.0000 mg | ORAL_TABLET | Freq: Once | ORAL | Status: AC
  Administered 2017-10-17: 5 mg via ORAL
  Filled 2017-10-17: qty 1

## 2017-10-17 MED ORDER — IOPAMIDOL (ISOVUE-370) INJECTION 76%
100.0000 mL | Freq: Once | INTRAVENOUS | Status: AC | PRN
  Administered 2017-10-17: 100 mL via INTRAVENOUS

## 2017-10-17 MED ORDER — GI COCKTAIL ~~LOC~~
30.0000 mL | Freq: Once | ORAL | Status: AC
  Administered 2017-10-17: 30 mL via ORAL
  Filled 2017-10-17: qty 30

## 2017-10-17 MED ORDER — MORPHINE SULFATE (PF) 4 MG/ML IV SOLN
4.0000 mg | Freq: Once | INTRAVENOUS | Status: AC
  Administered 2017-10-17: 4 mg via INTRAVENOUS
  Filled 2017-10-17: qty 1

## 2017-10-17 MED ORDER — ONDANSETRON HCL 4 MG/2ML IJ SOLN
4.0000 mg | Freq: Once | INTRAMUSCULAR | Status: AC
  Administered 2017-10-17: 4 mg via INTRAVENOUS
  Filled 2017-10-17: qty 2

## 2017-10-17 NOTE — ED Triage Notes (Signed)
Patient c/o constant mid chest pain since 0300 today. Patient states the pain radiates into the mid back. patient denies any N/V or SOB.

## 2017-10-17 NOTE — ED Notes (Signed)
Bed: WA11 Expected date:  Expected time:  Means of arrival:  Comments: Triage 1 

## 2017-10-17 NOTE — Discharge Instructions (Signed)
You can take Tylenol or Ibuprofen as directed for pain. You can alternate Tylenol and Ibuprofen every 4 hours. If you take Tylenol at 1pm, then you can take Ibuprofen at 5pm. Then you can take Tylenol again at 9pm.   Take Robaxin as prescribed. This medication will make you drowsy so do not drive or drink alcohol when taking it.  As we discussed, you need to take your blood pressure medication again.  Follow-up with your primary care doctor or [facility] for further evaluation.  Return to the Emergency Department immediately if you experiencing worsening chest pain, difficulty breathing, nausea/vomiting, get very sweaty, headache or any other worsening or concerning symptoms.

## 2017-10-17 NOTE — ED Provider Notes (Signed)
[facility] DEPT Provider Note   CSN: [id number] Arrival date & time: 10/17/17  1446     History   Chief Complaint Chief Complaint  Patient presents with  . Chest Pain    HPI Sally Garner is a 32 y.o. female past medical history of hypertension, migraines who presents for evaluation of chest pain that began at 3 AM this morning.  Patient states she is having some midsternal chest pain she describes as a "sharp pain."  She states she has not taken any medication for the pain.  She states the pain is worse with deep inspiration.  She has not noticed if it is worse with exertion but states that when she moves around it is worse.  She states it is better if she sits up and worse if she lays down flat.  She states she did not get nauseous or diaphoretic when she first had the pain.  Patient reports that she was in her normal state of health yesterday prior to onset of symptoms.  She has not had any associated difficulty breathing but does report that her pain is worse when she tries to take a deep breath in.  Patient does report that she smokes about half a pack of cigarettes a day.  Denies any cocaine or IV drug use.  Patient has no personal cardiac history and denies any family cardiac history.  She has a history of high blood pressure but no diabetes.  Patient denies any fevers, abdominal pain, nausea/vomiting. She denies any OCP use, recent immobilization, prior history of DVT/PE, recent surgery, leg swelling, or long travel.  The history is provided by the patient.    Past Medical History:  Diagnosis Date  . Hypertension    was put on medication end of 03/2015; states has caused headache and is not taking as prescribed  . Migraines   . Symptomatic cholelithiasis 04/2015    Patient Active Problem List   Diagnosis Date Noted  . Status post repeat low transverse cesarean section 07/23/2017  . Status post tubal ligation 07/23/2017  . Preexisting  hypertension complicating pregnancy, antepartum 07/20/2017  . Pregnancy with poor obstetric history   . Obesity affecting pregnancy in third trimester   . HTN in pregnancy, chronic   . Unwanted fertility 03/23/2017  . Elevated hemoglobin A1c 01/03/2017  . Chronic hypertension during pregnancy, antepartum 01/02/2017  . Supervision of high risk pregnancy, antepartum 12/29/2016  . Pleuritic chest pain 07/12/2013  . Status post primary low transverse cesarean section--FTP, NRFHR 05/26/2013  . Anemia 05/24/2013  . Smoker 05/24/2013  . Fibroid uterus 05/24/2013  . Obesity, unspecified--BMI 37.1 05/24/2013  . Hx of primary hypertension--2-3 years ago 05/24/2013    Past Surgical History:  Procedure Laterality Date  . CESAREAN SECTION N/A 05/25/2013   Procedure: CESAREAN SECTION;  Surgeon: Annalee Genta, DO;  Location: [facility];  Service: Obstetrics;  Laterality: N/A;  . CESAREAN SECTION N/A 07/23/2017   Procedure: CESAREAN SECTION;  Surgeon: Donnamae Jude, MD;  Location: [facility];  Service: Obstetrics;  Laterality: N/A;  . CHOLECYSTECTOMY N/A 05/05/2015   Procedure: LAPAROSCOPIC CHOLECYSTECTOMY;  Surgeon: Coralie Keens, MD;  Location: [facility];  Service: General;  Laterality: N/A;     OB History    Gravida  2   Para  2   Term  2   Preterm      AB      Living  2     SAB  TAB      Ectopic      Multiple  0   Live Births  2            Home Medications    Prior to Admission medications   Medication Sig Start Date End Date Taking? Authorizing Provider  acetaminophen (TYLENOL) 500 MG tablet Take 1,000 mg by mouth daily as needed for mild pain.   Yes [provider]  amLODipine (NORVASC) 10 MG tablet Take 1 tablet (10 mg total) by mouth daily. Patient not taking: Reported on 10/17/2017 07/25/17   Lajean Manes, CNM  ferrous sulfate 325 (65 FE) MG tablet Take 1 tablet (325 mg total) by mouth 3 (three) times daily with  meals. Patient not taking: Reported on 10/17/2017 07/25/17   Lajean Manes, CNM  methocarbamol (ROBAXIN) 500 MG tablet Take 1 tablet (500 mg total) by mouth 2 (two) times daily. 10/17/17   Volanda Napoleon, PA-C    Family History Family History  Problem Relation Age of Onset  . Hypertension Maternal Aunt   . Hypertension Maternal Uncle   . Birth defects Brother        polydactyl  . Cancer Paternal Aunt        breast cancer    Social History Social History   Tobacco Use  . Smoking status: Current Some Day Smoker    Packs/day: 0.50    Years: 3.00    Pack years: 1.50    Types: Cigarettes  . Smokeless tobacco: Never Used  Substance Use Topics  . Alcohol use: Yes    Comment: occasional  . Drug use: No     Allergies   Patient has no known allergies.   Review of Systems Review of Systems  Constitutional: Negative for fever.  Respiratory: Negative for cough and shortness of breath.   Cardiovascular: Positive for chest pain.  Gastrointestinal: Negative for abdominal pain, nausea and vomiting.  Genitourinary: Negative for dysuria and hematuria.  Neurological: Negative for headaches.  All other systems reviewed and are negative.    Physical Exam Updated Vital Signs BP (!) 156/105   Pulse 81   Temp 98.2 F (36.8 C) (Oral)   Resp 19   Ht 5\' 4"  (1.626 m)   Wt 99.8 kg   LMP 10/12/2017   SpO2 100%   BMI 37.76 kg/m   Physical Exam  Constitutional: She is oriented to person, place, and time. She appears well-developed and well-nourished.  HENT:  Head: Normocephalic and atraumatic.  Mouth/Throat: Oropharynx is clear and moist and mucous membranes are normal.  Eyes: Pupils are equal, round, and reactive to light. Conjunctivae, EOM and lids are normal.  Neck: Full passive range of motion without pain.  Cardiovascular: Normal rate, regular rhythm, normal heart sounds and normal pulses. Exam reveals no gallop and no friction rub.  No murmur heard. Pulses:       Radial pulses are 2+ on the right side, and 2+ on the left side.  Pain is reproduced with palpation of the midsternal chest.  Additionally, pain is reproduced with movement of the left upper extremity.  Pulmonary/Chest: Effort normal and breath sounds normal.  Lungs clear to auscultation bilaterally.  Symmetric chest rise.  No wheezing, rales, rhonchi.  Abdominal: Soft. Normal appearance. There is no tenderness. There is no rigidity and no guarding.  Musculoskeletal: Normal range of motion.  Neurological: She is alert and oriented to person, place, and time.  Skin: Skin is warm and dry. Capillary refill takes  less than 2 seconds.  Psychiatric: She has a normal mood and affect. Her speech is normal.  Nursing note and vitals reviewed.    ED Treatments / Results  Labs (all labs ordered are listed, but only abnormal results are displayed) Labs Reviewed  CBC - Abnormal; Notable for the following components:      Result Value   Hemoglobin 10.0 (*)    HCT 32.0 (*)    MCH 24.4 (*)    Platelets 469 (*)    All other components within normal limits  D-DIMER, QUANTITATIVE (NOT AT [facility]) - Abnormal; Notable for the following components:   D-Dimer, Quant 0.68 (*)    All other components within normal limits  BASIC METABOLIC PANEL  LIPASE, BLOOD  I-STAT TROPONIN, ED  I-STAT BETA HCG BLOOD, ED ([facility], [facility], [facility] ONLY)  I-STAT TROPONIN, ED    EKG EKG Interpretation  Date/Time:  Tuesday October 17 2017 15:19:20 EDT Ventricular Rate:  85 PR Interval:    QRS Duration: 82 QT Interval:  352 QTC Calculation: 419 R Axis:   58 Text Interpretation:  Sinus rhythm Borderline T abnormalities, diffuse leads new twi from prior 3/17 Confirmed by Aletta Edouard [phone]) on 10/17/2017 6:15:18 PM   Radiology Dg Chest 2 View  Result Date: 10/17/2017 CLINICAL DATA:  Chest pain EXAM: CHEST - 2 VIEW COMPARISON:  05/23/2017 FINDINGS: The heart size and mediastinal contours are within normal limits. Both lungs are  clear. The visualized skeletal structures are unremarkable. IMPRESSION: Clear lungs. Electronically Signed   By: Ulyses Jarred M.D.   On: 10/17/2017 16:29   Ct Angio Chest Pe W And/or Wo Contrast  Result Date: 10/17/2017 CLINICAL DATA:  Chest pain for several hours EXAM: CT ANGIOGRAPHY CHEST WITH CONTRAST TECHNIQUE: Multidetector CT imaging of the chest was performed using the standard protocol during bolus administration of intravenous contrast. Multiplanar CT image reconstructions and MIPs were obtained to evaluate the vascular anatomy. CONTRAST:  136mL ISOVUE-370 IOPAMIDOL (ISOVUE-370) INJECTION 76% COMPARISON:  Chest x-ray from earlier in the same day. FINDINGS: Cardiovascular: Thoracic aorta is well visualized and within normal limits. No aneurysmal dilatation or dissection is seen. The pulmonary artery is well visualized with a normal branching pattern. No filling defects to suggest pulmonary emboli are identified. No significant coronary calcifications are seen. Mediastinum/Nodes: Small right hilar lymph nodes are noted although not significant by size criteria. No significant axillary or mediastinal adenopathy is noted. The thoracic inlet is within normal limits. The esophagus is unremarkable. Lungs/Pleura: Lungs are well aerated bilaterally. No focal infiltrate or sizable effusion is seen. Some minimal dependent changes are noted. No pneumothorax is noted. Upper Abdomen: Visualized upper abdomen shows no focal abnormality. Musculoskeletal: No acute bony abnormality is seen. Some prominence of the breast tissue is noted which may be related to the recent postpartum state. Review of the MIP images confirms the above findings. IMPRESSION: No evidence of pulmonary emboli. Likely reactive right hilar lymph nodes. No other focal acute abnormality is noted. Electronically Signed   By: Inez Catalina M.D.   On: 10/17/2017 17:38    Procedures Procedures (including critical care time)  Medications Ordered in  ED Medications  morphine 4 MG/ML injection 4 mg (4 mg Intravenous Given 10/17/17 1653)  ondansetron (ZOFRAN) injection 4 mg (4 mg Intravenous Given 10/17/17 1653)  iopamidol (ISOVUE-370) 76 % injection 100 mL (100 mLs Intravenous Contrast Given 10/17/17 1702)  gi cocktail (Maalox,Lidocaine,Donnatal) (30 mLs Oral Given 10/17/17 1818)  ketorolac (TORADOL) 30 MG/ML injection 30 mg (  30 mg Intravenous Given 10/17/17 1818)  amLODipine (NORVASC) tablet 5 mg (5 mg Oral Given 10/17/17 1948)     Initial Impression / Assessment and Plan / ED Course  I have reviewed the triage vital signs and the nursing notes.  Pertinent labs & imaging results that were available during my care of the patient were reviewed by me and considered in my medical decision making (see chart for details).     32 year old female who presents for evaluation of chest pain that began at 3 AM this morning.  Reports worse with deep inspiration movement.  No personal cardiac history.  Does smoke.  No cane or IV drug use. Patient is afebrile, non-toxic appearing, sitting comfortably on examination table. Vital signs reviewed and stable.  Consider ACS etiology versus musculoskeletal etiology versus infectious etiology.  Low suspicion for endocarditis, pericarditis.  Low suspicion for PE but also consideration given pleuritic component.  Plan check basic labs, chest x-ray.  I Subedi negative.  I-STAT troponin negative.  Lipase unremarkable.  BMP is unremarkable.  CBC shows no leukocytosis.  Hemoglobin is 10.0.  D-dimer is elevated at 0.68.  Chest x-ray negative for any acute infectious etiology.  Given positive d-dimer, will plan for CTA for evaluation of PE.  EKG does show some nonspecific T wave changes.  No evidence of ST elevation.  Given patient's history/risk factors, she has a heart score of 2.  We will plan to delta troponin.  Discussed results with patient.  She reports some improvement in pain after analgesics.  She states that the  pain is more when she attempts to swallow, she has been soreness in her throat that radiates down into her chest.  Will give GI cocktail no additional analgesics.  CTA negative for any acute abnormality.  No evidence of PE.  Discussed results with patient.  She reports improvement after GI cocktail and additional pain medications.  Patient states she is ready to go home.  Delta troponin is negative.  At this time, do not suspect that patient's pain is from ACS etiology.  Given a low heart score, 2- delta troponins here in ED, feel that patient is safe for discharge home. At this time, patient exhibits no emergent life-threatening condition that require further evaluation in ED. patient is still slightly hypertensive.  Do not suspect hypertensive emergency.  She states that she has been on amlodipine but they told her that she could stop taking it.  She has not been on her blood pressure medication for several weeks.  Encouraged her to start her blood pressure medications again.  First dose of amlodipine given here in ED.  Patient reports she has the medication at home.  Encourage primary care follow-up. Patient had ample opportunity for questions and discussion. All patient's questions were answered with full understanding. Strict return precautions discussed. Patient expresses understanding and agreement to plan.     Final Clinical Impressions(s) / ED Diagnoses   Final diagnoses:  Nonspecific chest pain    ED Discharge Orders         Ordered    methocarbamol (ROBAXIN) 500 MG tablet  2 times daily     10/17/17 1948           Volanda Napoleon, PA-C 10/18/17 0030    Hayden Rasmussen, MD 10/18/17 1538

## 2017-12-28 ENCOUNTER — Other Ambulatory Visit: Payer: Self-pay | Admitting: Certified Nurse Midwife

## 2018-12-02 ENCOUNTER — Other Ambulatory Visit: Payer: Self-pay

## 2018-12-02 ENCOUNTER — Emergency Department (HOSPITAL_COMMUNITY): Payer: Medicaid Other

## 2018-12-02 ENCOUNTER — Emergency Department (HOSPITAL_COMMUNITY)
Admission: EM | Admit: 2018-12-02 | Discharge: 2018-12-02 | Disposition: A | Discharge status: Home / Self Care | Payer: Medicaid Other | Attending: Emergency Medicine | Admitting: Emergency Medicine

## 2018-12-02 DIAGNOSIS — Z20828 Contact with and (suspected) exposure to other viral communicable diseases: Secondary | ICD-10-CM | POA: Diagnosis not present

## 2018-12-02 DIAGNOSIS — R059 Cough, unspecified: Secondary | ICD-10-CM

## 2018-12-02 DIAGNOSIS — I10 Essential (primary) hypertension: Secondary | ICD-10-CM | POA: Diagnosis not present

## 2018-12-02 DIAGNOSIS — F17210 Nicotine dependence, cigarettes, uncomplicated: Secondary | ICD-10-CM | POA: Insufficient documentation

## 2018-12-02 DIAGNOSIS — R05 Cough: Secondary | ICD-10-CM | POA: Insufficient documentation

## 2018-12-02 DIAGNOSIS — Z79899 Other long term (current) drug therapy: Secondary | ICD-10-CM | POA: Insufficient documentation

## 2018-12-02 MED ORDER — ALBUTEROL SULFATE HFA 108 (90 BASE) MCG/ACT IN AERS: 1.0000 | INHALATION_SPRAY | Freq: Four times a day (QID) | RESPIRATORY_TRACT | 0 refills | Status: DC | PRN

## 2018-12-02 MED ORDER — BENZONATATE 100 MG PO CAPS: 100.0000 mg | ORAL_CAPSULE | Freq: Three times a day (TID) | ORAL | 0 refills | Status: DC

## 2018-12-02 NOTE — ED Provider Notes (Signed)
[facility] EMERGENCY DEPARTMENT Provider Note   CSN: [id number] Arrival date & time: 12/02/18  1035    History   Chief Complaint Chief Complaint  Patient presents with  . cough/congestion   HPI Sally Garner is a 33 y.o. female with past medical history significant for hypertension, chronic migraines, obesity who presents for evaluation of cough.  Patient states she has had cough productive of clear sputum over the last 4 days.  Is also had congestion, rhinorrhea.  Patient states her kids had similar symptoms however there is resolved and hers persisted.  She has been tolerating p.o. intake at home without difficulty.  She has lost her sense of taste and smell.  No known code exposures.  Denies fever, chills, nausea, vomiting, chest pain, shortness of breath, abdominal pain, diarrhea, dysuria.  Patient states occasionally she will feel "wheezey".  She has had to use albuterol previously for bronchitis.  Denies additional aggravating or alleviating factors.  She has been using OTC posterior without relief of her symptoms. Denies  additional aggravating or alleviating factors.  History obtained from patient and past medical records.  No interpreter was used.     HPI  Past Medical History:  Diagnosis Date  . Hypertension    was put on medication end of 03/2015; states has caused headache and is not taking as prescribed  . Migraines   . Symptomatic cholelithiasis 04/2015    Patient Active Problem List   Diagnosis Date Noted  . Status post repeat low transverse cesarean section 07/23/2017  . Status post tubal ligation 07/23/2017  . Preexisting hypertension complicating pregnancy, antepartum 07/20/2017  . Pregnancy with poor obstetric history   . Obesity affecting pregnancy in third trimester   . HTN in pregnancy, chronic   . Unwanted fertility 03/23/2017  . Elevated hemoglobin A1c 01/03/2017  . Chronic hypertension during pregnancy, antepartum 01/02/2017  .  Supervision of high risk pregnancy, antepartum 12/29/2016  . Pleuritic chest pain 07/12/2013  . Status post primary low transverse cesarean section--FTP, NRFHR 05/26/2013  . Anemia 05/24/2013  . Smoker 05/24/2013  . Fibroid uterus 05/24/2013  . Obesity, unspecified--BMI 37.1 05/24/2013  . Hx of primary hypertension--2-3 years ago 05/24/2013    Past Surgical History:  Procedure Laterality Date  . CESAREAN SECTION N/A 05/25/2013   Procedure: CESAREAN SECTION;  Surgeon: Annalee Genta, DO;  Location: [facility];  Service: Obstetrics;  Laterality: N/A;  . CESAREAN SECTION N/A 07/23/2017   Procedure: CESAREAN SECTION;  Surgeon: Donnamae Jude, MD;  Location: [facility];  Service: Obstetrics;  Laterality: N/A;  . CHOLECYSTECTOMY N/A 05/05/2015   Procedure: LAPAROSCOPIC CHOLECYSTECTOMY;  Surgeon: Coralie Keens, MD;  Location: [facility];  Service: General;  Laterality: N/A;     OB History    Gravida  2   Para  2   Term  2   Preterm      AB      Living  2     SAB      TAB      Ectopic      Multiple  0   Live Births  2            Home Medications    Prior to Admission medications   Medication Sig Start Date End Date Taking? Authorizing Provider  acetaminophen (TYLENOL) 500 MG tablet Take 1,000 mg by mouth daily as needed for mild pain.    [provider]  albuterol (VENTOLIN HFA) 108 (90 Base)  MCG/ACT inhaler Inhale 1-2 puffs into the lungs every 6 (six) hours as needed for wheezing or shortness of breath. 12/02/18   Zakeya Junker A, PA-C  amLODipine (NORVASC) 10 MG tablet Take 1 tablet (10 mg total) by mouth daily. Patient not taking: Reported on 10/17/2017 07/25/17   Lajean Manes, CNM  benzonatate (TESSALON) 100 MG capsule Take 1 capsule (100 mg total) by mouth every 8 (eight) hours. 12/02/18   Gabreal Worton A, PA-C  ferrous sulfate 325 (65 FE) MG tablet Take 1 tablet (325 mg total) by mouth 3 (three) times daily with meals.  Patient not taking: Reported on 10/17/2017 07/25/17   Lajean Manes, CNM  methocarbamol (ROBAXIN) 500 MG tablet Take 1 tablet (500 mg total) by mouth 2 (two) times daily. 10/17/17   Volanda Napoleon, PA-C    Family History Family History  Problem Relation Age of Onset  . Hypertension Maternal Aunt   . Hypertension Maternal Uncle   . Birth defects Brother        polydactyl  . Cancer Paternal Aunt        breast cancer    Social History Social History   Tobacco Use  . Smoking status: Current Some Day Smoker    Packs/day: 0.50    Years: 3.00    Pack years: 1.50    Types: Cigarettes  . Smokeless tobacco: Never Used  Substance Use Topics  . Alcohol use: Yes    Comment: occasional  . Drug use: No     Allergies   Patient has no known allergies.   Review of Systems Review of Systems  Constitutional: Negative.   HENT: Positive for congestion, postnasal drip and rhinorrhea. Negative for ear discharge, ear pain, facial swelling, nosebleeds, sinus pressure, sinus pain, sneezing, sore throat, trouble swallowing and voice change.   Respiratory: Positive for cough and wheezing. Negative for apnea, choking, chest tightness, shortness of breath and stridor.   Cardiovascular: Negative.   Gastrointestinal: Negative.   Genitourinary: Negative.   Musculoskeletal: Negative.   Skin: Negative.   Neurological: Negative.   All other systems reviewed and are negative.    Physical Exam Updated Vital Signs BP (!) 140/100   Pulse 80   Temp 98.5 F (36.9 C)   Resp 18   SpO2 100%   Physical Exam Vitals signs and nursing note reviewed.  Constitutional:      General: She is not in acute distress.    Appearance: She is well-developed. She is obese. She is not ill-appearing, toxic-appearing or diaphoretic.  HENT:     Head: Normocephalic and atraumatic.     Jaw: There is normal jaw occlusion.     Right Ear: Tympanic membrane, ear canal and external ear normal. There is no impacted  cerumen. No hemotympanum. Tympanic membrane is not injected, scarred, perforated, erythematous, retracted or bulging.     Left Ear: Tympanic membrane, ear canal and external ear normal. There is no impacted cerumen. No hemotympanum. Tympanic membrane is not injected, scarred, perforated, erythematous, retracted or bulging.     Ears:     Comments: No Mastoid tenderness.    Nose:     Comments: Clear rhinorrhea and congestion to bilateral nares.  No sinus tenderness.    Mouth/Throat:     Comments: Posterior oropharynx clear.  Mucous membranes moist.  Tonsils without erythema or exudate.  Uvula midline without deviation.  No evidence of PTA or RPA.  No drooling, dysphasia or trismus.  Phonation normal. Eyes:  Pupils: Pupils are equal, round, and reactive to light.  Neck:     Musculoskeletal: Normal range of motion.     Trachea: Trachea and phonation normal.     Meningeal: Brudzinski's sign and Kernig's sign absent.     Comments: No Neck stiffness or neck rigidity.  No meningismus.  No cervical lymphadenopathy. Cardiovascular:     Rate and Rhythm: Normal rate.     Comments: No murmurs rubs or gallops. Pulmonary:     Effort: No respiratory distress.     Comments: Clear to auscultation bilaterally without wheeze, rhonchi or rales.  No accessory muscle usage.  Able speak in full sentences. Abdominal:     General: There is no distension.     Comments: Soft, nontender without rebound or guarding.  No CVA tenderness.  Musculoskeletal: Normal range of motion.     Comments: Moves all 4 extremities without difficulty.  Lower extremities without edema, erythema or warmth.  Skin:    General: Skin is warm and dry.     Comments: Brisk capillary refill.  No rashes or lesions.  Neurological:     Mental Status: She is alert.     Comments: Ambulatory in department without difficulty.  Cranial nerves II through XII grossly intact.  No facial droop.  No aphasia.      ED Treatments / Results  Labs  (all labs ordered are listed, but only abnormal results are displayed) Labs Reviewed  NOVEL CORONAVIRUS, NAA (HOSP ORDER, SEND-OUT TO REF LAB; TAT 18-24 HRS)    EKG None  Radiology Dg Chest Portable 1 View  Result Date: 12/02/2018 CLINICAL DATA:  Chest pain and sob, cough congestion Pt refused to take off bra EXAM: PORTABLE CHEST 1 VIEW COMPARISON:  Chest radiograph 10/17/2017 FINDINGS: Stable cardiomediastinal contours. The lungs are clear. No pneumothorax or large pleural effusion. No acute findings in the visualized skeleton. IMPRESSION: No evidence of active disease in the chest. Electronically Signed   By: Audie Pinto M.D.   On: 12/02/2018 12:11    Procedures Procedures (including critical care time)  Medications Ordered in ED Medications - No data to display  Initial Impression / Assessment and Plan / ED Course  I have reviewed the triage vital signs and the nursing notes.  Pertinent labs & imaging results that were available during my care of the patient were reviewed by me and considered in my medical decision making (see chart for details).  33 year old female appears otherwise well presents for evaluation of upper respiratory symptoms.  Kids had similar symptoms 3 days ago however other symptoms resolved and hers persisted.  She has been afebrile at home.  She is tolerating p.o. intake without difficulty.  Has felt as if she was wheezing.  She has no wheeze on exam.  Her heart and lungs are clear.  No evidence of DVT.  Abdomen soft, nontender without rebound or guarding.  Chest x-ray without acute infiltrates, pneumothorax, pulmonary edema.  Will DC home with symptomatic treatment for upper respiratory infection.  Also test outpatient for COVID.  I discussed return precautions with patient.  Patient voiced understanding and is agreeable for follow-up.        Sally Garner was evaluated in Emergency Department on 12/02/2018 for the symptoms described in the history  of present illness. She was evaluated in the context of the global COVID-19 pandemic, which necessitated consideration that the patient might be at risk for infection with the SARS-CoV-2 virus that causes COVID-19. Institutional protocols and algorithms that pertain  to the evaluation of patients at risk for COVID-19 are in a state of rapid change based on information released by regulatory bodies including the CDC and federal and state organizations. These policies and algorithms were followed during the patient's care in the ED. Final Clinical Impressions(s) / ED Diagnoses   Final diagnoses:  Cough    ED Discharge Orders         Ordered    benzonatate (TESSALON) 100 MG capsule  Every 8 hours     12/02/18 1240    albuterol (VENTOLIN HFA) 108 (90 Base) MCG/ACT inhaler  Every 6 hours PRN     12/02/18 1240           Meko Masterson A, PA-C 12/02/18 [place], Nikolai, DO 12/02/18 1521

## 2018-12-02 NOTE — Discharge Instructions (Signed)
Take medications as prescribed.  Return for new worsening symptoms.

## 2018-12-02 NOTE — ED Triage Notes (Signed)
Patient complains of 3-4 days of cough and congestion. Smoker, no fever

## 2018-12-03 LAB — NOVEL CORONAVIRUS, NAA (HOSP ORDER, SEND-OUT TO REF LAB; TAT 18-24 HRS): SARS-CoV-2, NAA: NOT DETECTED

## 2019-04-28 ENCOUNTER — Other Ambulatory Visit: Payer: Self-pay

## 2019-04-28 ENCOUNTER — Encounter (HOSPITAL_COMMUNITY): Payer: Self-pay | Admitting: Emergency Medicine

## 2019-04-28 ENCOUNTER — Emergency Department (HOSPITAL_COMMUNITY)
Admission: EM | Admit: 2019-04-28 | Discharge: 2019-04-29 | Disposition: A | Discharge status: Home / Self Care | Payer: Medicaid Other | Attending: Emergency Medicine | Admitting: Emergency Medicine

## 2019-04-28 DIAGNOSIS — F17210 Nicotine dependence, cigarettes, uncomplicated: Secondary | ICD-10-CM | POA: Diagnosis not present

## 2019-04-28 DIAGNOSIS — R1013 Epigastric pain: Secondary | ICD-10-CM | POA: Diagnosis present

## 2019-04-28 DIAGNOSIS — I10 Essential (primary) hypertension: Secondary | ICD-10-CM | POA: Insufficient documentation

## 2019-04-28 DIAGNOSIS — M545 Low back pain, unspecified: Secondary | ICD-10-CM

## 2019-04-28 LAB — CBC
HCT: 33 % — ABNORMAL LOW (ref 36.0–46.0)
Hemoglobin: 10 g/dL — ABNORMAL LOW (ref 12.0–15.0)
MCH: 24.3 pg — ABNORMAL LOW (ref 26.0–34.0)
MCHC: 30.3 g/dL (ref 30.0–36.0)
MCV: 80.1 fL (ref 80.0–100.0)
Platelets: 336 10*3/uL (ref 150–400)
RBC: 4.12 MIL/uL (ref 3.87–5.11)
RDW: 14.6 % (ref 11.5–15.5)
WBC: 7.6 10*3/uL (ref 4.0–10.5)
nRBC: 0 % (ref 0.0–0.2)

## 2019-04-28 LAB — COMPREHENSIVE METABOLIC PANEL
ALT: 12 U/L (ref 0–44)
AST: 15 U/L (ref 15–41)
Albumin: 3.5 g/dL (ref 3.5–5.0)
Alkaline Phosphatase: 58 U/L (ref 38–126)
Anion gap: 8 (ref 5–15)
BUN: 9 mg/dL (ref 6–20)
CO2: 25 mmol/L (ref 22–32)
Calcium: 8.9 mg/dL (ref 8.9–10.3)
Chloride: 104 mmol/L (ref 98–111)
Creatinine, Ser: 0.69 mg/dL (ref 0.44–1.00)
GFR calc Af Amer: >60 mL/min (ref >60)
GFR calc non Af Amer: >60 mL/min (ref >60)
Glucose, Bld: 108 mg/dL — ABNORMAL HIGH (ref 70–99)
Potassium: 4 mmol/L (ref 3.5–5.1)
Sodium: 137 mmol/L (ref 135–145)
Total Bilirubin: 0.5 mg/dL (ref 0.3–1.2)
Total Protein: 6.9 g/dL (ref 6.5–8.1)

## 2019-04-28 LAB — LIPASE, BLOOD: Lipase: 25 U/L (ref 11–51)

## 2019-04-28 LAB — I-STAT BETA HCG BLOOD, ED (MC, WL, AP ONLY): I-stat hCG, quantitative: <5 m[IU]/mL (ref <5)

## 2019-04-28 MED ORDER — SODIUM CHLORIDE 0.9% FLUSH: 3.0000 mL | Freq: Once | INTRAVENOUS | Status: DC

## 2019-04-28 NOTE — ED Triage Notes (Signed)
Pt reports abdominal cramping that started Thursday, lower back pain started today.  Denied n/v/d.

## 2019-04-29 LAB — URINALYSIS, ROUTINE W REFLEX MICROSCOPIC
Bilirubin Urine: NEGATIVE
Glucose, UA: NEGATIVE mg/dL
Hgb urine dipstick: NEGATIVE
Ketones, ur: NEGATIVE mg/dL
Leukocytes,Ua: NEGATIVE
Nitrite: NEGATIVE
Protein, ur: NEGATIVE mg/dL
Specific Gravity, Urine: 1.018 (ref 1.005–1.030)
pH: 8 (ref 5.0–8.0)

## 2019-04-29 MED ORDER — FAMOTIDINE 20 MG PO TABS: 20.0000 mg | ORAL_TABLET | Freq: Two times a day (BID) | ORAL | 0 refills | Status: DC

## 2019-04-29 MED ORDER — HYDROCHLOROTHIAZIDE 12.5 MG PO TABS: 12.5000 mg | ORAL_TABLET | Freq: Every day | ORAL | 0 refills | Status: DC

## 2019-04-29 MED ORDER — IBUPROFEN 400 MG PO TABS
600.0000 mg | ORAL_TABLET | Freq: Once | ORAL | Status: AC
  Administered 2019-04-29: 600 mg via ORAL
  Filled 2019-04-29: qty 1

## 2019-04-29 MED ORDER — HYDROCODONE-ACETAMINOPHEN 5-325 MG PO TABS
1.0000 | ORAL_TABLET | Freq: Once | ORAL | Status: AC
  Administered 2019-04-29: 01:00:00 1 via ORAL
  Filled 2019-04-29: qty 1

## 2019-04-29 NOTE — Discharge Instructions (Addendum)
Your blood pressure needs to be treated with medication. A prescription has been provided and needs to be taken as directed.   Please contact the [facility], or a primary care provider of your choice, to schedule an appointment for blood pressure recheck in 1 week.   Recommend Tylenol for back pain. Ibuprofen may exacerbate your epigastric discomfort. Take Pepcid twice daily and follow up with your doctor if your pain continues.   If you develop a fever, severe pain, vomiting or new concern, please return to the emergency department.

## 2019-04-29 NOTE — ED Provider Notes (Signed)
[facility] EMERGENCY DEPARTMENT Provider Note   CSN: [id number] Arrival date & time: 04/28/19  2118     History Chief Complaint  Patient presents with  . Abdominal Pain  . Back Pain    Sally Garner is a 34 y.o. female.  Patient to ED with complaint of epigastric/LUQ abdominal pain and low back pain that started today. No nausea or vomiting. No fever. The abdominal pain comes and goes without identified modifying factors. No chest pain, cough, SOB. No urinary symptoms. Her low back pain is present when standing, better when lying or sitting. No known injury. No history of back pain. She reports starting her menses 4 days ago at her normal time, normal flow (heavy), and has been having lower abdominal cramps which are typical for her.   The history is provided by the patient. No language interpreter was used.  Abdominal Pain Associated symptoms: no chest pain, no chills, no constipation, no cough, no diarrhea, no dysuria, no fever, no nausea, no shortness of breath, no vaginal discharge and no vomiting   Back Pain Associated symptoms: abdominal pain   Associated symptoms: no chest pain, no dysuria and no fever        Past Medical History:  Diagnosis Date  . Hypertension    was put on medication end of 03/2015; states has caused headache and is not taking as prescribed  . Migraines   . Symptomatic cholelithiasis 04/2015    Patient Active Problem List   Diagnosis Date Noted  . Status post repeat low transverse cesarean section 07/23/2017  . Status post tubal ligation 07/23/2017  . Preexisting hypertension complicating pregnancy, antepartum 07/20/2017  . Pregnancy with poor obstetric history   . Obesity affecting pregnancy in third trimester   . HTN in pregnancy, chronic   . Unwanted fertility 03/23/2017  . Elevated hemoglobin A1c 01/03/2017  . Chronic hypertension during pregnancy, antepartum 01/02/2017  . Supervision of high risk pregnancy, antepartum  12/29/2016  . Pleuritic chest pain 07/12/2013  . Status post primary low transverse cesarean section--FTP, NRFHR 05/26/2013  . Anemia 05/24/2013  . Smoker 05/24/2013  . Fibroid uterus 05/24/2013  . Obesity, unspecified--BMI 37.1 05/24/2013  . Hx of primary hypertension--2-3 years ago 05/24/2013    Past Surgical History:  Procedure Laterality Date  . CESAREAN SECTION N/A 05/25/2013   Procedure: CESAREAN SECTION;  Surgeon: Annalee Genta, DO;  Location: [facility];  Service: Obstetrics;  Laterality: N/A;  . CESAREAN SECTION N/A 07/23/2017   Procedure: CESAREAN SECTION;  Surgeon: Donnamae Jude, MD;  Location: [facility];  Service: Obstetrics;  Laterality: N/A;  . CHOLECYSTECTOMY N/A 05/05/2015   Procedure: LAPAROSCOPIC CHOLECYSTECTOMY;  Surgeon: Coralie Keens, MD;  Location: [facility];  Service: General;  Laterality: N/A;     OB History    Gravida  2   Para  2   Term  2   Preterm      AB      Living  2     SAB      TAB      Ectopic      Multiple  0   Live Births  2           Family History  Problem Relation Age of Onset  . Hypertension Maternal Aunt   . Hypertension Maternal Uncle   . Birth defects Brother        polydactyl  . Cancer Paternal Aunt  breast cancer    Social History   Tobacco Use  . Smoking status: Current Some Day Smoker    Packs/day: 0.50    Years: 3.00    Pack years: 1.50    Types: Cigarettes  . Smokeless tobacco: Never Used  Substance Use Topics  . Alcohol use: Yes    Comment: occasional  . Drug use: No    Home Medications Prior to Admission medications   Medication Sig Start Date End Date Taking? Authorizing Provider  albuterol (VENTOLIN HFA) 108 (90 Base) MCG/ACT inhaler Inhale 1-2 puffs into the lungs every 6 (six) hours as needed for wheezing or shortness of breath. Patient not taking: Reported on 04/29/2019 12/02/18   Henderly, Britni A, PA-C  amLODipine (NORVASC) 10 MG tablet Take 1 tablet (10  mg total) by mouth daily. Patient not taking: Reported on 10/17/2017 07/25/17   Lajean Manes, CNM  benzonatate (TESSALON) 100 MG capsule Take 1 capsule (100 mg total) by mouth every 8 (eight) hours. Patient not taking: Reported on 04/29/2019 12/02/18   Henderly, Britni A, PA-C  ferrous sulfate 325 (65 FE) MG tablet Take 1 tablet (325 mg total) by mouth 3 (three) times daily with meals. Patient not taking: Reported on 10/17/2017 07/25/17   Lajean Manes, CNM  methocarbamol (ROBAXIN) 500 MG tablet Take 1 tablet (500 mg total) by mouth 2 (two) times daily. Patient not taking: Reported on 04/29/2019 10/17/17   Volanda Napoleon, PA-C    Allergies    Patient has no known allergies.  Review of Systems   Review of Systems  Constitutional: Negative for chills and fever.  HENT: Negative.   Respiratory: Negative.  Negative for cough and shortness of breath.   Cardiovascular: Negative.  Negative for chest pain.  Gastrointestinal: Positive for abdominal pain. Negative for constipation, diarrhea, nausea and vomiting.  Genitourinary: Negative.  Negative for dysuria, flank pain and vaginal discharge.  Musculoskeletal: Positive for back pain.  Skin: Negative.   Neurological: Negative.     Physical Exam Updated Vital Signs BP (!) 152/111 (BP Location: Left Arm)   Pulse 83   Temp 98.2 F (36.8 C) (Oral)   Resp 14   SpO2 99%   Physical Exam Vitals and nursing note reviewed.  Constitutional:      Appearance: She is well-developed.  HENT:     Head: Normocephalic.  Cardiovascular:     Rate and Rhythm: Normal rate and regular rhythm.     Heart sounds: No murmur.  Pulmonary:     Effort: Pulmonary effort is normal.     Breath sounds: Normal breath sounds. No wheezing, rhonchi or rales.  Abdominal:     General: Bowel sounds are normal.     Palpations: Abdomen is soft.     Tenderness: There is no abdominal tenderness. There is no guarding or rebound.     Comments: No significant abdomen  tenderness.   Musculoskeletal:        General: Normal range of motion.     Cervical back: Normal range of motion and neck supple.  Skin:    General: Skin is warm and dry.     Findings: No rash.  Neurological:     Mental Status: She is alert.     Cranial Nerves: No cranial nerve deficit.     ED Results / Procedures / Treatments   Labs (all labs ordered are listed, but only abnormal results are displayed) Labs Reviewed  COMPREHENSIVE METABOLIC PANEL - Abnormal; Notable for the following  components:      Result Value   Glucose, Bld 108 (*)    All other components within normal limits  CBC - Abnormal; Notable for the following components:   Hemoglobin 10.0 (*)    HCT 33.0 (*)    MCH 24.3 (*)    All other components within normal limits  LIPASE, BLOOD  URINALYSIS, ROUTINE W REFLEX MICROSCOPIC  I-STAT BETA HCG BLOOD, ED ([facility], [facility], [facility] ONLY)    EKG None  Radiology No results found.  Procedures Procedures (including critical care time)  Medications Ordered in ED Medications  sodium chloride flush (NS) 0.9 % injection 3 mL (has no administration in time range)  HYDROcodone-acetaminophen (NORCO/VICODIN) 5-325 MG per tablet 1 tablet (1 tablet Oral Given 04/29/19 0035)  ibuprofen (ADVIL) tablet 600 mg (600 mg Oral Given 04/29/19 0035)    ED Course  I have reviewed the triage vital signs and the nursing notes.  Pertinent labs & imaging results that were available during my care of the patient were reviewed by me and considered in my medical decision making (see chart for details).    MDM Rules/Calculators/A&P                      Patient to ED with c/o upper abdominal and low back pain as detailed in the HPI.   She is overall well appearing. VSS, hypertensive. On no HTN medications. Some question as to whether she took them regularly in the past.   Pain is improved with medications in the ED. Labs completely normal, no evidence to suggestion infection, pancreatitis. Low back  pain felt unrelated and has no associated neurologic deficit.  She is felt appropriate for discharge home. Recommend PCP follow up. She will need to start medications for HTN. This was discussed with the patient.  Final Clinical Impression(s) / ED Diagnoses Final diagnoses:  None   1. Epigastric pain 2. Low back pain 3. Hypertension   Rx / DC Orders ED Discharge Orders    None       Dennie Bible 05/01/19 2328    Fatima Blank, MD 05/06/19 [phone]

## 2019-04-29 NOTE — ED Notes (Signed)
Discharge instructions discussed with Pt. Pt verbalized understanding. Pt stable and ambulatory.    

## 2019-05-29 IMAGING — DX DG CHEST 2V
2 series · 2 of 2 positions shown · non-contrast
Comparison: Chest radiograph 08/07/2015

CLINICAL DATA: Cough and pregnancy

EXAM:
CHEST - 2 VIEW

[chest pa]
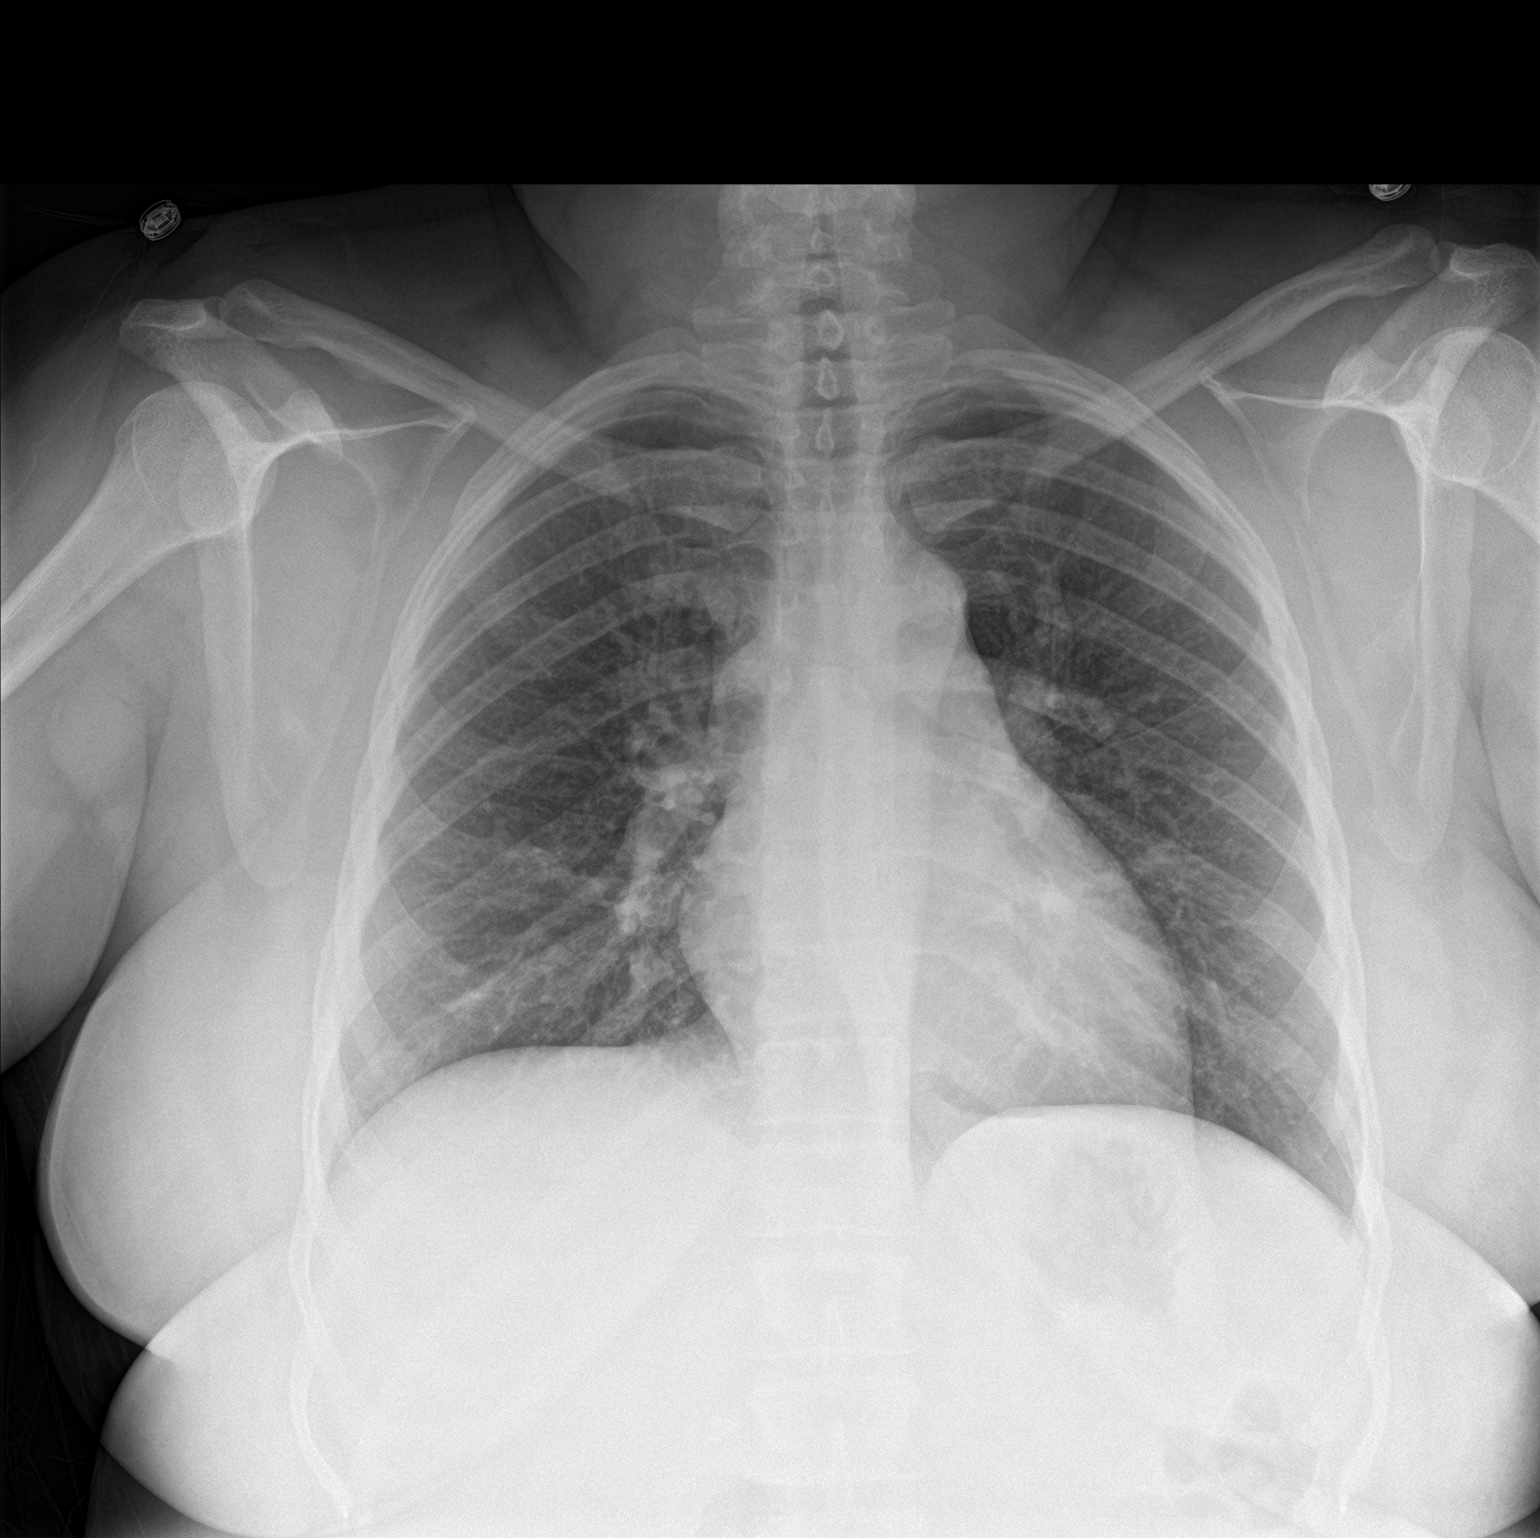

[chest lat]
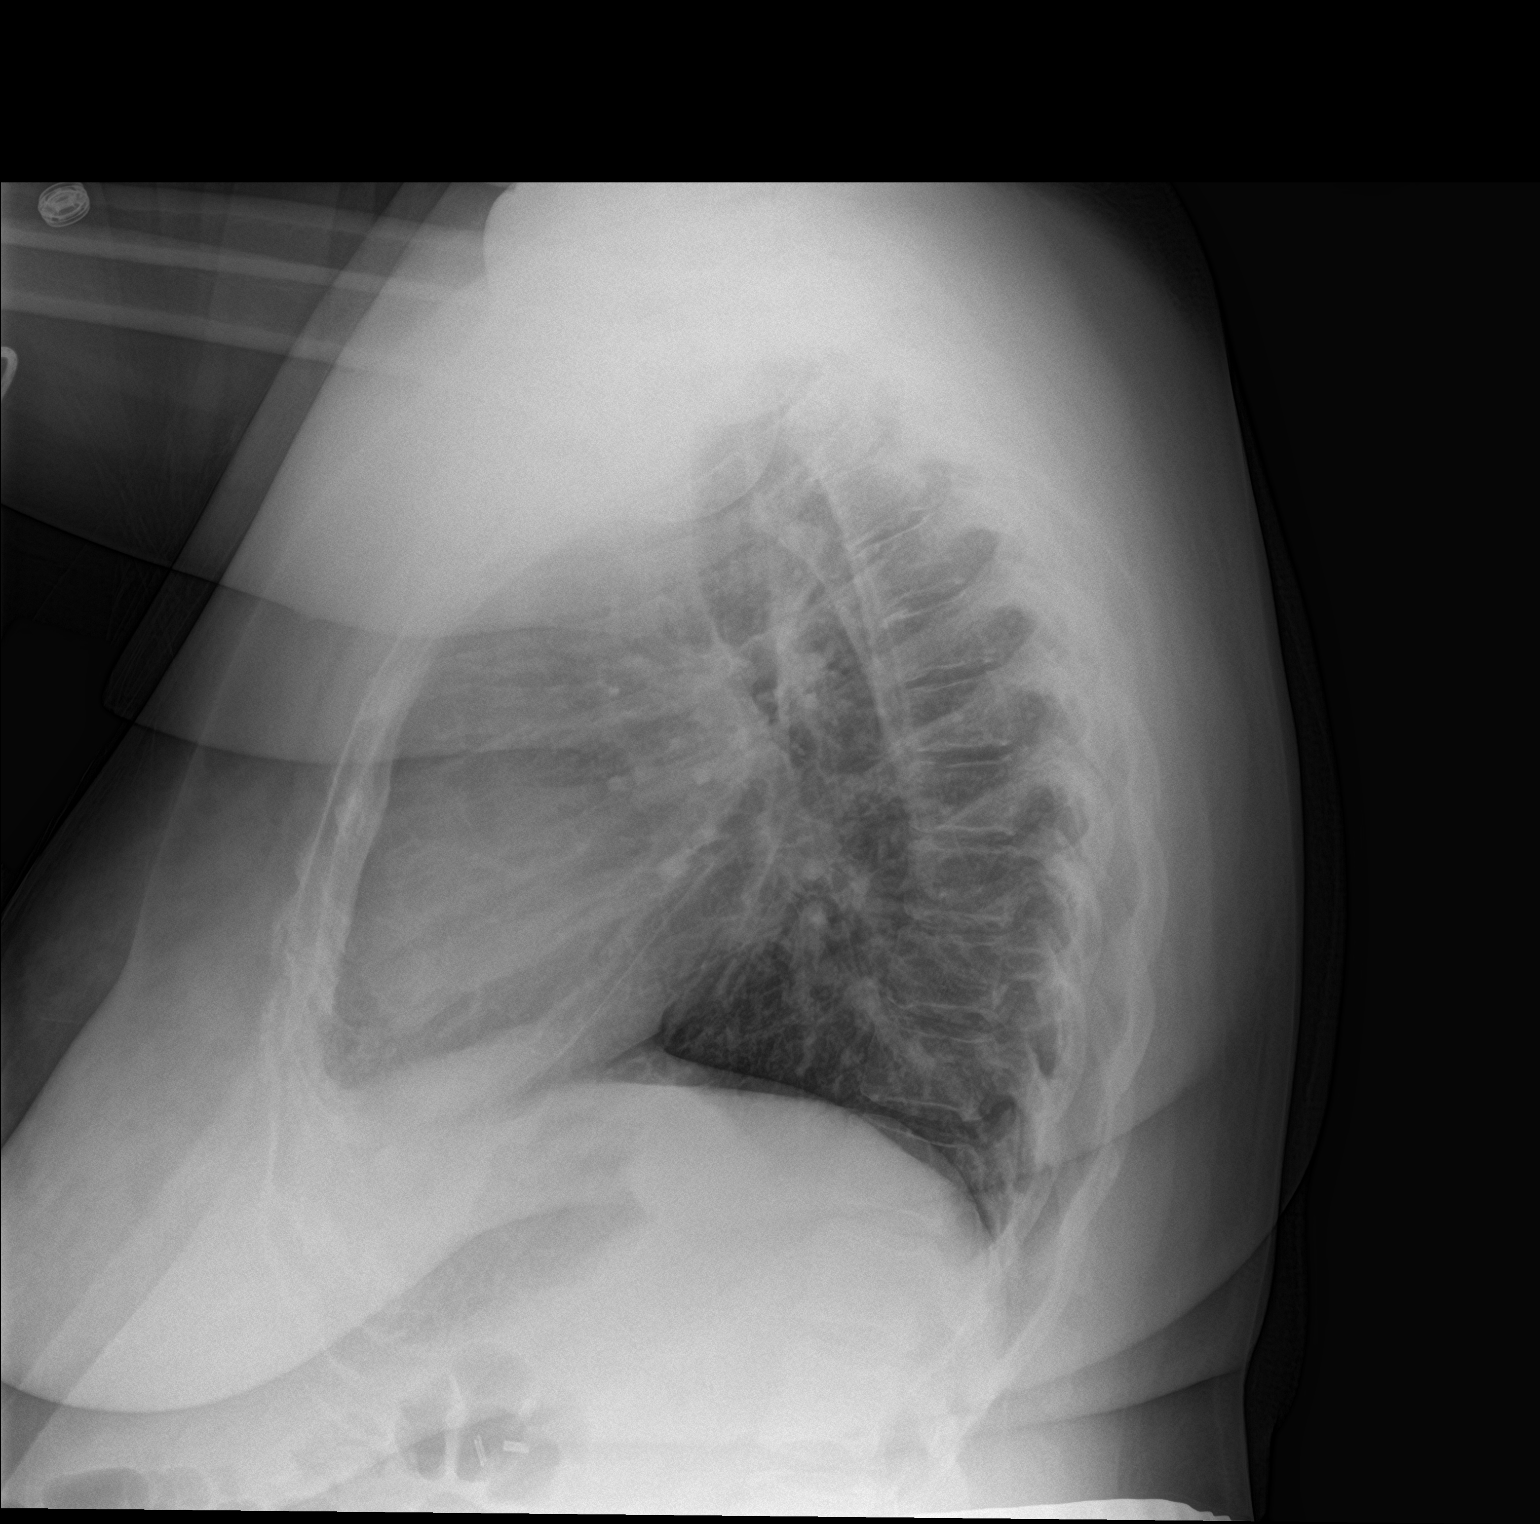

[2 of 2 positions shown; findings below may reference images not displayed]

FINDINGS: The heart size and mediastinal contours are within normal limits.
Both lungs are clear. The visualized skeletal structures are
unremarkable.
IMPRESSION: Clear lungs.

## 2019-12-13 ENCOUNTER — Encounter (HOSPITAL_COMMUNITY): Payer: Self-pay | Admitting: Emergency Medicine

## 2019-12-13 ENCOUNTER — Ambulatory Visit (HOSPITAL_COMMUNITY)
Admission: EM | Admit: 2019-12-13 | Discharge: 2019-12-13 | Disposition: A | Discharge status: Home / Self Care | Payer: Medicaid Other | Attending: Family Medicine | Admitting: Family Medicine

## 2019-12-13 ENCOUNTER — Other Ambulatory Visit: Payer: Self-pay

## 2019-12-13 DIAGNOSIS — H5789 Other specified disorders of eye and adnexa: Secondary | ICD-10-CM | POA: Diagnosis not present

## 2019-12-13 MED ORDER — ERYTHROMYCIN 5 MG/GM OP OINT: TOPICAL_OINTMENT | OPHTHALMIC | 0 refills | Status: DC

## 2019-12-13 NOTE — ED Triage Notes (Signed)
Pt presents with right eye swelling. States after rubbing eye earlier this evening eye started swelling. Denies pain but states itches.

## 2019-12-13 NOTE — ED Provider Notes (Signed)
[place]    CSN: [id number] Arrival date & time: 12/13/19  1850      History   Chief Complaint Chief Complaint  Patient presents with  . Facial Swelling    HPI Sally Garner is a 34 y.o. female.   Patient presenting today with some right eye irritation that started this evening when she was in the kitchen cooking and rubbed her eye. States the eye feels swollen and is itching but not painful. States vision intact, not blurry or double, no headache, N/V. Has not tried anything for sxs yet. No known hx of eye issues and does not think she splattered anything into it while cooking or injured it with anything.      Past Medical History:  Diagnosis Date  . Hypertension    was put on medication end of 03/2015; states has caused headache and is not taking as prescribed  . Migraines   . Symptomatic cholelithiasis 04/2015    Patient Active Problem List   Diagnosis Date Noted  . Status post repeat low transverse cesarean section 07/23/2017  . Status post tubal ligation 07/23/2017  . Preexisting hypertension complicating pregnancy, antepartum 07/20/2017  . Pregnancy with poor obstetric history   . Obesity affecting pregnancy in third trimester   . HTN in pregnancy, chronic   . Unwanted fertility 03/23/2017  . Elevated hemoglobin A1c 01/03/2017  . Chronic hypertension during pregnancy, antepartum 01/02/2017  . Supervision of high risk pregnancy, antepartum 12/29/2016  . Pleuritic chest pain 07/12/2013  . Status post primary low transverse cesarean section--FTP, NRFHR 05/26/2013  . Anemia 05/24/2013  . Smoker 05/24/2013  . Fibroid uterus 05/24/2013  . Obesity, unspecified--BMI 37.1 05/24/2013  . Hx of primary hypertension--2-3 years ago 05/24/2013    Past Surgical History:  Procedure Laterality Date  . CESAREAN SECTION N/A 05/25/2013   Procedure: CESAREAN SECTION;  Surgeon: Annalee Genta, DO;  Location: [facility];  Service: Obstetrics;  Laterality: N/A;  .  CESAREAN SECTION N/A 07/23/2017   Procedure: CESAREAN SECTION;  Surgeon: Donnamae Jude, MD;  Location: [facility];  Service: Obstetrics;  Laterality: N/A;  . CHOLECYSTECTOMY N/A 05/05/2015   Procedure: LAPAROSCOPIC CHOLECYSTECTOMY;  Surgeon: Coralie Keens, MD;  Location: [facility];  Service: General;  Laterality: N/A;    OB History    Gravida  2   Para  2   Term  2   Preterm      AB      Living  2     SAB      TAB      Ectopic      Multiple  0   Live Births  2            Home Medications    Prior to Admission medications   Medication Sig Start Date End Date Taking? Authorizing Provider  albuterol (VENTOLIN HFA) 108 (90 Base) MCG/ACT inhaler Inhale 1-2 puffs into the lungs every 6 (six) hours as needed for wheezing or shortness of breath. Patient not taking: Reported on 04/29/2019 12/02/18   Henderly, Britni A, PA-C  amLODipine (NORVASC) 10 MG tablet Take 1 tablet (10 mg total) by mouth daily. Patient not taking: Reported on 10/17/2017 07/25/17   Lajean Manes, CNM  benzonatate (TESSALON) 100 MG capsule Take 1 capsule (100 mg total) by mouth every 8 (eight) hours. Patient not taking: Reported on 04/29/2019 12/02/18   Henderly, Britni A, PA-C  erythromycin ophthalmic ointment Place a 1/2 inch ribbon of  ointment into the lower eyelid. 12/13/19   Volney American, PA-C  famotidine (PEPCID) 20 MG tablet Take 1 tablet (20 mg total) by mouth 2 (two) times daily. 04/29/19   Charlann Lange, PA-C  ferrous sulfate 325 (65 FE) MG tablet Take 1 tablet (325 mg total) by mouth 3 (three) times daily with meals. Patient not taking: Reported on 10/17/2017 07/25/17   Lajean Manes, CNM  hydrochlorothiazide (HYDRODIURIL) 12.5 MG tablet Take 1 tablet (12.5 mg total) by mouth daily. 04/29/19   Charlann Lange, PA-C  methocarbamol (ROBAXIN) 500 MG tablet Take 1 tablet (500 mg total) by mouth 2 (two) times daily. Patient not taking: Reported on 04/29/2019 10/17/17    Volanda Napoleon, PA-C    Family History Family History  Problem Relation Age of Onset  . Hypertension Maternal Aunt   . Hypertension Maternal Uncle   . Birth defects Brother        polydactyl  . Cancer Paternal Aunt        breast cancer    Social History Social History   Tobacco Use  . Smoking status: Current Some Day Smoker    Packs/day: 0.50    Years: 3.00    Pack years: 1.50    Types: Cigarettes  . Smokeless tobacco: Never Used  Vaping Use  . Vaping Use: Never used  Substance Use Topics  . Alcohol use: Yes    Comment: occasional  . Drug use: No     Allergies   Patient has no known allergies.   Review of Systems Review of Systems PER HPI   Physical Exam Triage Vital Signs ED Triage Vitals [12/13/19 1939]  Enc Vitals Group     BP (!) 133/92     Pulse Rate 89     Resp 17     Temp 98.1 F (36.7 C)     Temp Source Oral     SpO2 100 %     Weight      Height      Head Circumference      Peak Flow      Pain Score 0     Pain Loc      Pain Edu?      Excl. in [place]?    No data found.  Updated Vital Signs BP (!) 133/92 (BP Location: Right Arm)   Pulse 89   Temp 98.1 F (36.7 C) (Oral)   Resp 17   SpO2 100%   Visual Acuity Right Eye Distance:   Left Eye Distance:   Bilateral Distance:    Right Eye Near:   Left Eye Near:    Bilateral Near:     Physical Exam Vitals and nursing note reviewed.  Constitutional:      Appearance: Normal appearance. She is not ill-appearing.  HENT:     Head: Atraumatic.  Eyes:     Extraocular Movements: Extraocular movements intact.     Pupils: Pupils are equal, round, and reactive to light.     Comments: Localized area of conjunctival edema around 7 o'clock right eye, no discoloration, drainage, signs of abrasion  Cardiovascular:     Rate and Rhythm: Normal rate and regular rhythm.     Heart sounds: Normal heart sounds.  Pulmonary:     Effort: Pulmonary effort is normal.     Breath sounds: Normal breath  sounds.  Musculoskeletal:        General: Normal range of motion.     Cervical back: Normal range of motion  and neck supple.  Skin:    General: Skin is warm and dry.  Neurological:     Mental Status: She is alert and oriented to person, place, and time.  Psychiatric:        Mood and Affect: Mood normal.        Thought Content: Thought content normal.        Judgment: Judgment normal.      UC Treatments / Results  Labs (all labs ordered are listed, but only abnormal results are displayed) Labs Reviewed - No data to display  EKG   Radiology No results found.  Procedures Procedures (including critical care time)  Medications Ordered in UC Medications - No data to display  Initial Impression / Assessment and Plan / UC Course  I have reviewed the triage vital signs and the nursing notes.  Pertinent labs & imaging results that were available during my care of the patient were reviewed by me and considered in my medical decision making (see chart for details).     Unclear if inflammation from friction of her rubbing the eye or if she is having a small allergic reaction to something that may have gotten into her eye. Will start erythromycin ointment to prevent infection while inflamed and provide barrier to eye, follow up first thing next week with [facility]ist for recheck and can take benadryl for itching and in case allergic response. Return precautions reviewed if worsening in meantime.   Final Clinical Impressions(s) / UC Diagnoses   Final diagnoses:  Eye irritation   Discharge Instructions   None    ED Prescriptions    Medication Sig Dispense Auth. Provider   erythromycin ophthalmic ointment Place a 1/2 inch ribbon of ointment into the lower eyelid. 3.5 g Volney American, [place]     PDMP not reviewed this encounter.   Merrie Roof [place], [place] 12/14/19 [phone]

## 2020-01-10 ENCOUNTER — Ambulatory Visit (HOSPITAL_COMMUNITY)
Admission: EM | Admit: 2020-01-10 | Discharge: 2020-01-10 | Disposition: A | Discharge status: Home / Self Care | Payer: Medicaid Other | Attending: Physician Assistant | Admitting: Physician Assistant

## 2020-01-10 ENCOUNTER — Encounter (HOSPITAL_COMMUNITY): Payer: Self-pay | Admitting: Emergency Medicine

## 2020-01-10 ENCOUNTER — Other Ambulatory Visit: Payer: Self-pay

## 2020-01-10 DIAGNOSIS — G43009 Migraine without aura, not intractable, without status migrainosus: Secondary | ICD-10-CM

## 2020-01-10 DIAGNOSIS — I10 Essential (primary) hypertension: Secondary | ICD-10-CM

## 2020-01-10 MED ORDER — DIPHENHYDRAMINE HCL 50 MG/ML IJ SOLN
25.0000 mg | Freq: Once | INTRAMUSCULAR | Status: AC
  Administered 2020-01-10: 25 mg via INTRAMUSCULAR

## 2020-01-10 MED ORDER — KETOROLAC TROMETHAMINE 60 MG/2ML IM SOLN
INTRAMUSCULAR | Status: AC
  Filled 2020-01-10: qty 2

## 2020-01-10 MED ORDER — KETOROLAC TROMETHAMINE 60 MG/2ML IM SOLN
60.0000 mg | Freq: Once | INTRAMUSCULAR | Status: AC
  Administered 2020-01-10: 60 mg via INTRAMUSCULAR

## 2020-01-10 MED ORDER — HYDROCHLOROTHIAZIDE 12.5 MG PO TABS: 12.5000 mg | ORAL_TABLET | Freq: Every day | ORAL | 2 refills | Status: DC

## 2020-01-10 MED ORDER — METOCLOPRAMIDE HCL 5 MG/ML IJ SOLN
10.0000 mg | Freq: Once | INTRAMUSCULAR | Status: AC
  Administered 2020-01-10: 10 mg via INTRAMUSCULAR

## 2020-01-10 MED ORDER — METOCLOPRAMIDE HCL 5 MG/ML IJ SOLN
INTRAMUSCULAR | Status: AC
Start: 2020-01-10 — End: ?
  Filled 2020-01-10: qty 2

## 2020-01-10 MED ORDER — DIPHENHYDRAMINE HCL 50 MG/ML IJ SOLN
INTRAMUSCULAR | Status: AC
  Filled 2020-01-10: qty 1

## 2020-01-10 NOTE — ED Provider Notes (Signed)
[place]    CSN: [id number] Arrival date & time: 01/10/20  4098      History   Chief Complaint Chief Complaint  Patient presents with  . Headache    HPI Sally Garner is a 34 y.o. female.   The history is provided by the patient.  Headache Pain location:  Generalized Quality:  Sharp Radiates to:  Does not radiate Severity currently:  8/10 Severity at highest:  8/10 Timing:  Constant Progression:  Partially resolved Chronicity:  Recurrent Relieved by:  Nothing Worsened by:  Nothing Ineffective treatments:  Acetaminophen Associated symptoms: no nausea   Risk factors: no family hx of [place]     Past Medical History:  Diagnosis Date  . Hypertension    was put on medication end of 03/2015; states has caused headache and is not taking as prescribed  . Migraines   . Symptomatic cholelithiasis 04/2015    Patient Active Problem List   Diagnosis Date Noted  . Status post repeat low transverse cesarean section 07/23/2017  . Status post tubal ligation 07/23/2017  . Preexisting hypertension complicating pregnancy, antepartum 07/20/2017  . Pregnancy with poor obstetric history   . Obesity affecting pregnancy in third trimester   . HTN in pregnancy, chronic   . Unwanted fertility 03/23/2017  . Elevated hemoglobin A1c 01/03/2017  . Chronic hypertension during pregnancy, antepartum 01/02/2017  . Supervision of high risk pregnancy, antepartum 12/29/2016  . Pleuritic chest pain 07/12/2013  . Status post primary low transverse cesarean section--FTP, NRFHR 05/26/2013  . Anemia 05/24/2013  . Smoker 05/24/2013  . Fibroid uterus 05/24/2013  . Obesity, unspecified--BMI 37.1 05/24/2013  . Hx of primary hypertension--2-3 years ago 05/24/2013    Past Surgical History:  Procedure Laterality Date  . CESAREAN SECTION N/A 05/25/2013   Procedure: CESAREAN SECTION;  Surgeon: Annalee Genta, DO;  Location: [facility];  Service: Obstetrics;  Laterality: N/A;  . CESAREAN  SECTION N/A 07/23/2017   Procedure: CESAREAN SECTION;  Surgeon: Donnamae Jude, MD;  Location: [facility];  Service: Obstetrics;  Laterality: N/A;  . CHOLECYSTECTOMY N/A 05/05/2015   Procedure: LAPAROSCOPIC CHOLECYSTECTOMY;  Surgeon: Coralie Keens, MD;  Location: [facility];  Service: General;  Laterality: N/A;    OB History    Gravida  2   Para  2   Term  2   Preterm      AB      Living  2     SAB      TAB      Ectopic      Multiple  0   Live Births  2            Home Medications    Prior to Admission medications   Medication Sig Start Date End Date Taking? Authorizing Provider  famotidine (PEPCID) 20 MG tablet Take 1 tablet (20 mg total) by mouth 2 (two) times daily. 04/29/19   Charlann Lange, PA-C  hydrochlorothiazide (HYDRODIURIL) 12.5 MG tablet Take 1 tablet (12.5 mg total) by mouth daily. 01/10/20   Fransico Meadow, PA-C  albuterol (VENTOLIN HFA) 108 (90 Base) MCG/ACT inhaler Inhale 1-2 puffs into the lungs every 6 (six) hours as needed for wheezing or shortness of breath. Patient not taking: Reported on 04/29/2019 12/02/18 01/10/20  Henderly, Britni A, PA-C  amLODipine (NORVASC) 10 MG tablet Take 1 tablet (10 mg total) by mouth daily. Patient not taking: Reported on 10/17/2017 07/25/17 01/10/20  Lajean Manes, CNM  ferrous sulfate 325 (  65 FE) MG tablet Take 1 tablet (325 mg total) by mouth 3 (three) times daily with meals. Patient not taking: Reported on 10/17/2017 07/25/17 01/10/20  Lajean Manes, CNM    Family History Family History  Problem Relation Age of Onset  . Hypertension Maternal Aunt   . Hypertension Maternal Uncle   . Birth defects Brother        polydactyl  . Cancer Paternal Aunt        breast cancer    Social History Social History   Tobacco Use  . Smoking status: Current Some Day Smoker    Packs/day: 0.50    Years: 3.00    Pack years: 1.50    Types: Cigarettes  . Smokeless tobacco: Never Used  Vaping  Use  . Vaping Use: Never used  Substance Use Topics  . Alcohol use: Yes    Comment: occasional  . Drug use: No     Allergies   Patient has no known allergies.   Review of Systems Review of Systems  Gastrointestinal: Negative for nausea.  Neurological: Positive for headaches.  All other systems reviewed and are negative.    Physical Exam Triage Vital Signs ED Triage Vitals  Enc Vitals Group     BP 01/10/20 0928 (!) 145/101     Pulse Rate 01/10/20 0928 84     Resp 01/10/20 0928 20     Temp 01/10/20 0928 97.8 F (36.6 C)     Temp Source 01/10/20 0928 Oral     SpO2 01/10/20 0928 100 %     Weight --      Height --      Head Circumference --      Peak Flow --      Pain Score 01/10/20 0925 7     Pain Loc --      Pain Edu? --      Excl. in [place]? --    No data found.  Updated Vital Signs BP (!) 145/101   Pulse 84   Temp 97.8 F (36.6 C) (Oral)   Resp 20   LMP 01/03/2020   SpO2 100%   Visual Acuity Right Eye Distance:   Left Eye Distance:   Bilateral Distance:    Right Eye Near:   Left Eye Near:    Bilateral Near:     Physical Exam Vitals and nursing note reviewed.  Constitutional:      Appearance: She is well-developed.  HENT:     Head: Normocephalic.  Cardiovascular:     Rate and Rhythm: Normal rate.  Pulmonary:     Effort: Pulmonary effort is normal.  Abdominal:     General: There is no distension.  Musculoskeletal:        General: Normal range of motion.     Cervical back: Normal range of motion.  Neurological:     Mental Status: She is alert and oriented to person, place, and time.  Psychiatric:        Mood and Affect: Mood normal.      UC Treatments / Results  Labs (all labs ordered are listed, but only abnormal results are displayed) Labs Reviewed - No data to display  EKG   Radiology No results found.  Procedures Procedures (including critical care time)  Medications Ordered in UC Medications  ketorolac (TORADOL)  injection 60 mg (has no administration in time range)  metoCLOPramide (REGLAN) injection 10 mg (has no administration in time range)  diphenhydrAMINE (BENADRYL) injection 25 mg (has no  administration in time range)    Initial Impression / Assessment and Plan / UC Course  I have reviewed the triage vital signs and the nursing notes.  Pertinent labs & imaging results that were available during my care of the patient were reviewed by me and considered in my medical decision making (see chart for details).    MDM:  Pt reports she wants to start back on bp medication.  I will start her on hctz.  Pt given migraine cocktail   Final Clinical Impressions(s) / UC Diagnoses   Final diagnoses:  Migraine without aura and without status migrainosus, not intractable  Essential hypertension     Discharge Instructions     Return if any problems. Schedule primary care evaluation for ongoing blood pressure and migraine mangement     ED Prescriptions    Medication Sig Dispense Auth. Provider   hydrochlorothiazide (HYDRODIURIL) 12.5 MG tablet Take 1 tablet (12.5 mg total) by mouth daily. 30 tablet Fransico Meadow, [place]     PDMP not reviewed this encounter.  An After Visit Summary was printed and given to the patient.    Fransico Meadow, [place] 01/10/20 1026

## 2020-01-10 NOTE — ED Triage Notes (Signed)
Pt c/o headache x 3 days. Pt states it is normally in the morning when she wakes up. She states she has a hx of high blood pressure. She normally takes Excedrin migraine with relief. She states she has some sensitivity to light.

## 2020-01-10 NOTE — Discharge Instructions (Signed)
Return if any problems. Schedule primary care evaluation for ongoing blood pressure and migraine mangement

## 2020-01-12 ENCOUNTER — Emergency Department (HOSPITAL_COMMUNITY)
Admission: EM | Admit: 2020-01-12 | Discharge: 2020-01-12 | Disposition: A | Discharge status: Home / Self Care | Payer: Medicaid Other | Attending: Emergency Medicine | Admitting: Emergency Medicine

## 2020-01-12 ENCOUNTER — Other Ambulatory Visit: Payer: Self-pay

## 2020-01-12 DIAGNOSIS — I10 Essential (primary) hypertension: Secondary | ICD-10-CM | POA: Diagnosis not present

## 2020-01-12 DIAGNOSIS — F17210 Nicotine dependence, cigarettes, uncomplicated: Secondary | ICD-10-CM | POA: Diagnosis not present

## 2020-01-12 DIAGNOSIS — Z79899 Other long term (current) drug therapy: Secondary | ICD-10-CM | POA: Diagnosis not present

## 2020-01-12 DIAGNOSIS — R519 Headache, unspecified: Secondary | ICD-10-CM | POA: Diagnosis present

## 2020-01-12 LAB — COMPREHENSIVE METABOLIC PANEL
ALT: 17 U/L (ref 0–44)
AST: 26 U/L (ref 15–41)
Albumin: 3.6 g/dL (ref 3.5–5.0)
Alkaline Phosphatase: 56 U/L (ref 38–126)
Anion gap: 10 (ref 5–15)
BUN: 8 mg/dL (ref 6–20)
CO2: 23 mmol/L (ref 22–32)
Calcium: 9.2 mg/dL (ref 8.9–10.3)
Chloride: 102 mmol/L (ref 98–111)
Creatinine, Ser: 0.73 mg/dL (ref 0.44–1.00)
GFR, Estimated: >60 mL/min (ref >60)
Glucose, Bld: 113 mg/dL — ABNORMAL HIGH (ref 70–99)
Potassium: 4.3 mmol/L (ref 3.5–5.1)
Sodium: 135 mmol/L (ref 135–145)
Total Bilirubin: 0.5 mg/dL (ref 0.3–1.2)
Total Protein: 7.4 g/dL (ref 6.5–8.1)

## 2020-01-12 LAB — CBC WITH DIFFERENTIAL/PLATELET
Abs Immature Granulocytes: 0.03 10*3/uL (ref 0.00–0.07)
Basophils Absolute: 0 10*3/uL (ref 0.0–0.1)
Basophils Relative: 1 %
Eosinophils Absolute: 0.3 10*3/uL (ref 0.0–0.5)
Eosinophils Relative: 4 %
HCT: 38.3 % (ref 36.0–46.0)
Hemoglobin: 11.3 g/dL — ABNORMAL LOW (ref 12.0–15.0)
Immature Granulocytes: 1 %
Lymphocytes Relative: 47 %
Lymphs Abs: 2.9 10*3/uL (ref 0.7–4.0)
MCH: 25.1 pg — ABNORMAL LOW (ref 26.0–34.0)
MCHC: 29.5 g/dL — ABNORMAL LOW (ref 30.0–36.0)
MCV: 84.9 fL (ref 80.0–100.0)
Monocytes Absolute: 0.6 10*3/uL (ref 0.1–1.0)
Monocytes Relative: 10 %
Neutro Abs: 2.3 10*3/uL (ref 1.7–7.7)
Neutrophils Relative %: 37 %
Platelets: 284 10*3/uL (ref 150–400)
RBC: 4.51 MIL/uL (ref 3.87–5.11)
RDW: 14.5 % (ref 11.5–15.5)
WBC: 6.2 10*3/uL (ref 4.0–10.5)
nRBC: 0 % (ref 0.0–0.2)

## 2020-01-12 MED ORDER — KETOROLAC TROMETHAMINE 30 MG/ML IJ SOLN
30.0000 mg | Freq: Once | INTRAMUSCULAR | Status: AC
  Administered 2020-01-12: 30 mg via INTRAVENOUS
  Filled 2020-01-12: qty 1

## 2020-01-12 MED ORDER — DEXAMETHASONE SODIUM PHOSPHATE 10 MG/ML IJ SOLN
10.0000 mg | Freq: Once | INTRAMUSCULAR | Status: AC
  Administered 2020-01-12: 10 mg via INTRAVENOUS
  Filled 2020-01-12: qty 1

## 2020-01-12 MED ORDER — SODIUM CHLORIDE 0.9 % IV BOLUS
500.0000 mL | Freq: Once | INTRAVENOUS | Status: AC
  Administered 2020-01-12: 500 mL via INTRAVENOUS

## 2020-01-12 NOTE — ED Triage Notes (Signed)
Pt. Stated, I was at work had a headache and checked my BP and it was high

## 2020-01-12 NOTE — ED Triage Notes (Signed)
-  pt. Was at [facility] 2 days ago for same problem.

## 2020-01-12 NOTE — Discharge Instructions (Signed)

## 2020-01-12 NOTE — ED Provider Notes (Signed)
[facility] EMERGENCY DEPARTMENT Provider Note   CSN: [id number] Arrival date & time: 01/12/20  2952     History Chief Complaint  Patient presents with  . Hypertension  . Headache    Sally Garner is a 34 y.o. female has a pmh of Here with UL R sided ha with light sensitivity. Throbbing. Sensitive to light. Started 2 days ago. Seen at [facility] and given a migraine cocktail. Denies fevers, neck stiffness, n/v. Denies other neuro sxs. Hx of the same.  Long standing hx of HTN. +daily smoker    HPI     Past Medical History:  Diagnosis Date  . Hypertension    was put on medication end of 03/2015; states has caused headache and is not taking as prescribed  . Migraines   . Symptomatic cholelithiasis 04/2015    Patient Active Problem List   Diagnosis Date Noted  . Status post repeat low transverse cesarean section 07/23/2017  . Status post tubal ligation 07/23/2017  . Preexisting hypertension complicating pregnancy, antepartum 07/20/2017  . Pregnancy with poor obstetric history   . Obesity affecting pregnancy in third trimester   . HTN in pregnancy, chronic   . Unwanted fertility 03/23/2017  . Elevated hemoglobin A1c 01/03/2017  . Chronic hypertension during pregnancy, antepartum 01/02/2017  . Supervision of high risk pregnancy, antepartum 12/29/2016  . Pleuritic chest pain 07/12/2013  . Status post primary low transverse cesarean section--FTP, NRFHR 05/26/2013  . Anemia 05/24/2013  . Smoker 05/24/2013  . Fibroid uterus 05/24/2013  . Obesity, unspecified--BMI 37.1 05/24/2013  . Hx of primary hypertension--2-3 years ago 05/24/2013    Past Surgical History:  Procedure Laterality Date  . CESAREAN SECTION N/A 05/25/2013   Procedure: CESAREAN SECTION;  Surgeon: Annalee Genta, DO;  Location: [facility];  Service: Obstetrics;  Laterality: N/A;  . CESAREAN SECTION N/A 07/23/2017   Procedure: CESAREAN SECTION;  Surgeon: Donnamae Jude, MD;  Location: [place];  Service: Obstetrics;  Laterality: N/A;  . CHOLECYSTECTOMY N/A 05/05/2015   Procedure: LAPAROSCOPIC CHOLECYSTECTOMY;  Surgeon: Coralie Keens, MD;  Location: [place];  Service: General;  Laterality: N/A;     OB History    Gravida  2   Para  2   Term  2   Preterm      AB      Living  2     SAB      TAB      Ectopic      Multiple  0   Live Births  2           Family History  Problem Relation Age of Onset  . Hypertension Maternal Aunt   . Hypertension Maternal Uncle   . Birth defects Brother        polydactyl  . Cancer Paternal Aunt        breast cancer    Social History   Tobacco Use  . Smoking status: Current Some Day Smoker    Packs/day: 0.50    Years: 3.00    Pack years: 1.50    Types: Cigarettes  . Smokeless tobacco: Never Used  Vaping Use  . Vaping Use: Never used  Substance Use Topics  . Alcohol use: Yes    Comment: occasional  . Drug use: No    Home Medications Prior to Admission medications   Medication Sig Start Date End Date Taking? Authorizing Provider  famotidine (PEPCID) 20 MG tablet Take 1 tablet (20 mg total)  by mouth 2 (two) times daily. 04/29/19   Charlann Lange, PA-C  hydrochlorothiazide (HYDRODIURIL) 12.5 MG tablet Take 1 tablet (12.5 mg total) by mouth daily. 01/10/20   Fransico Meadow, PA-C  albuterol (VENTOLIN HFA) 108 (90 Base) MCG/ACT inhaler Inhale 1-2 puffs into the lungs every 6 (six) hours as needed for wheezing or shortness of breath. Patient not taking: Reported on 04/29/2019 12/02/18 01/10/20  Henderly, Britni A, PA-C  amLODipine (NORVASC) 10 MG tablet Take 1 tablet (10 mg total) by mouth daily. Patient not taking: Reported on 10/17/2017 07/25/17 01/10/20  Lajean Manes, CNM  ferrous sulfate 325 (65 FE) MG tablet Take 1 tablet (325 mg total) by mouth 3 (three) times daily with meals. Patient not taking: Reported on 10/17/2017 07/25/17 01/10/20  Lajean Manes, CNM    Allergies    Patient  has no known allergies.  Review of Systems   Review of Systems Ten systems reviewed and are negative for acute change, except as noted in the HPI.    Updated Vital Signs BP (!) 166/117 (BP Location: Right Arm)   Pulse (!) 112   Temp 98.4 F (36.9 C) (Oral)   Resp 20   LMP 01/03/2020   SpO2 100%   Physical Exam  Physical Exam Physical Exam  Constitutional: Pt is oriented to person, place, and time. Pt appears well-developed and well-nourished. No distress.  HENT:  Head: Normocephalic and atraumatic.  Mouth/Throat: Oropharynx is clear and moist.  Eyes: Conjunctivae and EOM are normal. Pupils are equal, round, and reactive to light. No scleral icterus.  No horizontal, vertical or rotational nystagmus  Neck: Normal range of motion. Neck supple.  Full active and passive ROM without pain No midline or paraspinal tenderness No nuchal rigidity or meningeal signs  Cardiovascular: Normal rate, regular rhythm and intact distal pulses.   Pulmonary/Chest: Effort normal and breath sounds normal. No respiratory distress. Pt has no wheezes. No rales.  Abdominal: Soft. Bowel sounds are normal. There is no tenderness. There is no rebound and no guarding.  Musculoskeletal: Normal range of motion.  Lymphadenopathy:    No cervical adenopathy.  Neurological: Pt. is alert and oriented to person, place, and time. He has normal reflexes. No cranial nerve deficit.  Exhibits normal muscle tone. Coordination normal.  Mental Status:  Alert, oriented, thought content appropriate. Speech fluent without evidence of aphasia. Able to follow 2 step commands without difficulty.  Cranial Nerves:  II:  Peripheral visual fields grossly normal, pupils equal, round, reactive to light III,IV, VI: ptosis not present, extra-ocular motions intact bilaterally  V,VII: smile symmetric, facial light touch sensation equal VIII: hearing grossly normal bilaterally  IX,X: midline uvula rise  XI: bilateral shoulder shrug  equal and strong XII: midline tongue extension  Motor:  5/5 in upper and lower extremities bilaterally including strong and equal grip strength and dorsiflexion/plantar flexion Sensory: Pinprick and light touch normal in all extremities.  Deep Tendon Reflexes: 2+ and symmetric  Cerebellar: normal finger-to-nose with bilateral upper extremities Gait: normal gait and balance CV: distal pulses palpable throughout   Skin: Skin is warm and dry. No rash noted. Pt is not diaphoretic.  Psychiatric: Pt has a normal mood and affect. Behavior is normal. Judgment and thought content normal.  Nursing note and vitals reviewed.  ED Results / Procedures / Treatments   Labs (all labs ordered are listed, but only abnormal results are displayed) Labs Reviewed  CBC WITH DIFFERENTIAL/PLATELET  COMPREHENSIVE METABOLIC PANEL    EKG None  Radiology No results found.  Procedures Procedures (including critical care time)  Medications Ordered in ED Medications - No data to display  ED Course  I have reviewed the triage vital signs and the nursing notes.  Pertinent labs & imaging results that were available during my care of the patient were reviewed by me and considered in my medical decision making (see chart for details).    MDM Rules/Calculators/A&P                          Sally Garner presents with headache Given the large differential diagnosis for [facility], the decision making in this case is of high complexity.  I ordered and reviewed the patient's labs, CMP shows mildly elevated glucose of insignificant value, CBC shows normocytic anemia of insignificant value, no signs of end organ damage.  The patient was counseled on the dangers of tobacco use, and was advised to quit. Reviewed strategies to maximize success, including removing cigarettes and smoking materials from environment, stress management, substitution of other forms of reinforcement, support of family/friends and  written materials.  After evaluating all of the data points in this case, the presentation of Sally Garner is NOT consistent with skull fracture, meningitis/encephalitis, SAH/sentinel bleed, Intracranial Hemorrhage (ICH) (subdural/epidural), acute obstructive hydrocephalus, space occupying lesions, CVA, CO Poisoning, Basilar/vertebral artery dissection, preeclampsia, cerebral venous thrombosis, hypertensive emergency, temporal Arteritis, Idiopathic Intracranial Hypertension (pseudotumor cerebri).  Strict return and follow-up precautions have been given by me personally or by detailed written instructions verbalized by nursing staff using the teach back method to patient/family/caregiver.  Data Reviewed/Counseling: I have reviewed the patient's vital signs, nursing notes, and other relevant tests/information. I had a detailed discussion regarding the historical points, exam findings, and any diagnostic results supporting the discharge diagnosis. I also discussed the need for outpatient follow-up and the need to return to the ED if symptoms worsen or if there are any questions or concerns that arise at [facility]  Final Clinical Impression(s) / ED Diagnoses Final diagnoses:  None    Rx / DC Orders ED Discharge Orders    None       Margarita Mail, PA-C 01/12/20 1203    Sherwood Gambler, MD 01/12/20 1247

## 2020-01-12 NOTE — ED Notes (Signed)
Pt states headache pain is on the rights side of her head and face. Denies N/V and denies feeling lightheaded or dizzy.  States this is similar to when she when to urgent care 2 days ago.   Alert and oriented does not appear in distress Respirations are even and non-labored Skin is warm, dry and intact

## 2020-01-12 NOTE — Care Management (Addendum)
Assigned PCP needs to call [facility] on Monday to establish primary care placed in patient instructions

## 2020-04-11 ENCOUNTER — Other Ambulatory Visit: Payer: Self-pay

## 2020-04-11 ENCOUNTER — Encounter (HOSPITAL_COMMUNITY): Payer: Self-pay | Admitting: *Deleted

## 2020-04-11 ENCOUNTER — Ambulatory Visit (HOSPITAL_COMMUNITY)
Admission: EM | Admit: 2020-04-11 | Discharge: 2020-04-11 | Disposition: A | Discharge status: Home / Self Care | Payer: Medicaid Other | Attending: Urgent Care | Admitting: Urgent Care

## 2020-04-11 ENCOUNTER — Ambulatory Visit (INDEPENDENT_AMBULATORY_CARE_PROVIDER_SITE_OTHER): Payer: Medicaid Other

## 2020-04-11 DIAGNOSIS — F17210 Nicotine dependence, cigarettes, uncomplicated: Secondary | ICD-10-CM | POA: Insufficient documentation

## 2020-04-11 DIAGNOSIS — Z79899 Other long term (current) drug therapy: Secondary | ICD-10-CM | POA: Insufficient documentation

## 2020-04-11 DIAGNOSIS — J209 Acute bronchitis, unspecified: Secondary | ICD-10-CM | POA: Diagnosis not present

## 2020-04-11 DIAGNOSIS — R059 Cough, unspecified: Secondary | ICD-10-CM

## 2020-04-11 DIAGNOSIS — R0602 Shortness of breath: Secondary | ICD-10-CM | POA: Diagnosis not present

## 2020-04-11 DIAGNOSIS — R5383 Other fatigue: Secondary | ICD-10-CM | POA: Diagnosis present

## 2020-04-11 DIAGNOSIS — Z20822 Contact with and (suspected) exposure to covid-19: Secondary | ICD-10-CM | POA: Diagnosis not present

## 2020-04-11 DIAGNOSIS — R0789 Other chest pain: Secondary | ICD-10-CM

## 2020-04-11 LAB — SARS CORONAVIRUS 2 (TAT 6-24 HRS): SARS Coronavirus 2: NEGATIVE

## 2020-04-11 MED ORDER — PREDNISONE 20 MG PO TABS: ORAL_TABLET | ORAL | 0 refills | Status: DC

## 2020-04-11 MED ORDER — ALBUTEROL SULFATE HFA 108 (90 BASE) MCG/ACT IN AERS: 1.0000 | INHALATION_SPRAY | Freq: Four times a day (QID) | RESPIRATORY_TRACT | 0 refills | Status: DC | PRN

## 2020-04-11 MED ORDER — PROMETHAZINE-DM 6.25-15 MG/5ML PO SYRP: 5.0000 mL | ORAL_SOLUTION | Freq: Every evening | ORAL | 0 refills | Status: DC | PRN

## 2020-04-11 MED ORDER — BENZONATATE 100 MG PO CAPS: 100.0000 mg | ORAL_CAPSULE | Freq: Three times a day (TID) | ORAL | 0 refills | Status: DC | PRN

## 2020-04-11 NOTE — ED Triage Notes (Signed)
Pt reports Sx's started 2 days ago. Cough first started then [facility] and vomiting today.

## 2020-04-11 NOTE — ED Provider Notes (Signed)
[facility]   MRN: [id number] DOB: 07-Feb-1986  Subjective:   Sally Garner is a 35 y.o. female presenting for 2-day history of acute onset malaise, fatigue, cough that elicits chest pain and shortness of breath. Feels like she is wheezing now and had an episode of vomiting with her cough today. She has not COVID vaccinated. Smokes half pack per day. Denies history of asthma. No other history of lung disorders.  No current facility-administered medications for this encounter.  Current Outpatient Medications:  .  famotidine (PEPCID) 20 MG tablet, Take 1 tablet (20 mg total) by mouth 2 (two) times daily., Disp: 30 tablet, Rfl: 0 .  hydrochlorothiazide (HYDRODIURIL) 12.5 MG tablet, Take 1 tablet (12.5 mg total) by mouth daily., Disp: 30 tablet, Rfl: 2   No Known Allergies  Past Medical History:  Diagnosis Date  . Hypertension    was put on medication end of 03/2015; states has caused headache and is not taking as prescribed  . Migraines   . Symptomatic cholelithiasis 04/2015     Past Surgical History:  Procedure Laterality Date  . CESAREAN SECTION N/A 05/25/2013   Procedure: CESAREAN SECTION;  Surgeon: Annalee Genta, DO;  Location: [facility];  Service: Obstetrics;  Laterality: N/A;  . CESAREAN SECTION N/A 07/23/2017   Procedure: CESAREAN SECTION;  Surgeon: Donnamae Jude, MD;  Location: [place];  Service: Obstetrics;  Laterality: N/A;  . CHOLECYSTECTOMY N/A 05/05/2015   Procedure: LAPAROSCOPIC CHOLECYSTECTOMY;  Surgeon: Coralie Keens, MD;  Location: [place];  Service: General;  Laterality: N/A;    Family History  Problem Relation Age of Onset  . Hypertension Maternal Aunt   . Hypertension Maternal Uncle   . Birth defects Brother        polydactyl  . Cancer Paternal Aunt        breast cancer    Social History   Tobacco Use  . Smoking status: Current Some Day Smoker    Packs/day: 0.50    Years: 3.00    Pack years: 1.50     Types: Cigarettes  . Smokeless tobacco: Never Used  Vaping Use  . Vaping Use: Never used  Substance Use Topics  . Alcohol use: Yes    Comment: occasional  . Drug use: No    ROS   Objective:   Vitals: BP (!) 145/90 (BP Location: Right Arm)   Pulse (!) 102   Temp 98.5 F (36.9 C) (Oral)   Resp (!) 8   LMP 03/19/2020   SpO2 96%   Physical Exam Constitutional:      General: She is not in acute distress.    Appearance: Normal appearance. She is well-developed. She is not ill-appearing, toxic-appearing or diaphoretic.  HENT:     Head: Normocephalic and atraumatic.     Nose: Nose normal.     Mouth/Throat:     Mouth: Mucous membranes are moist.     Pharynx: Oropharynx is clear.  Eyes:     General: No scleral icterus.    Extraocular Movements: Extraocular movements intact.     Pupils: Pupils are equal, round, and reactive to light.  Cardiovascular:     Rate and Rhythm: Normal rate and regular rhythm.     Pulses: Normal pulses.     Heart sounds: Normal heart sounds. No murmur heard. No friction rub. No gallop.   Pulmonary:     Effort: Pulmonary effort is normal. No respiratory distress.     Breath sounds:  No stridor. Examination of the left-middle field reveals rhonchi. Examination of the left-lower field reveals rhonchi. Rhonchi present. No wheezing or rales.  Skin:    General: Skin is warm and dry.     Findings: No rash.  Neurological:     General: No focal deficit present.     Mental Status: She is alert and oriented to person, place, and time.  Psychiatric:        Mood and Affect: Mood normal.        Behavior: Behavior normal.        Thought Content: Thought content normal.    DG Chest 2 View  Result Date: 04/11/2020 CLINICAL DATA:  Cough and shortness of breath 2 days. EXAM: CHEST - 2 VIEW COMPARISON:  12/02/2018 FINDINGS: The cardiomediastinal silhouette is unremarkable. Mild peribronchial thickening noted. There is no evidence of focal airspace disease,  pulmonary edema, suspicious pulmonary nodule/mass, pleural effusion, or pneumothorax. No acute bony abnormalities are identified. IMPRESSION: Mild peribronchial thickening without focal pneumonia. Electronically Signed   By: Margarette Canada M.D.   On: 04/11/2020 15:26     Assessment and Plan :   PDMP not reviewed this encounter.  1. Acute bronchitis, unspecified organism   2. Cough   3. Atypical chest pain   4. Shortness of breath     In light of patient's smoking, physical exam findings and x-ray for use of prednisone course, albuterol inhaler.  COVID-19 testing pending.  Recommend supportive care otherwise. Counseled patient on potential for adverse effects with medications prescribed/recommended today, ER and return-to-clinic precautions discussed, patient verbalized understanding.    Jaynee Eagles, PA-C 04/11/20 1540

## 2020-04-11 NOTE — Discharge Instructions (Addendum)

## 2020-06-16 ENCOUNTER — Ambulatory Visit (HOSPITAL_COMMUNITY)
Admission: EM | Admit: 2020-06-16 | Discharge: 2020-06-16 | Disposition: A | Discharge status: Home / Self Care | Payer: Medicaid Other | Attending: Student | Admitting: Student

## 2020-06-16 ENCOUNTER — Other Ambulatory Visit: Payer: Self-pay

## 2020-06-16 ENCOUNTER — Encounter (HOSPITAL_COMMUNITY): Payer: Self-pay | Admitting: Emergency Medicine

## 2020-06-16 DIAGNOSIS — Z1152 Encounter for screening for COVID-19: Secondary | ICD-10-CM

## 2020-06-16 DIAGNOSIS — Z20822 Contact with and (suspected) exposure to covid-19: Secondary | ICD-10-CM | POA: Insufficient documentation

## 2020-06-16 DIAGNOSIS — Z9851 Tubal ligation status: Secondary | ICD-10-CM | POA: Diagnosis not present

## 2020-06-16 DIAGNOSIS — R112 Nausea with vomiting, unspecified: Secondary | ICD-10-CM | POA: Diagnosis not present

## 2020-06-16 DIAGNOSIS — J069 Acute upper respiratory infection, unspecified: Secondary | ICD-10-CM | POA: Diagnosis not present

## 2020-06-16 DIAGNOSIS — I10 Essential (primary) hypertension: Secondary | ICD-10-CM | POA: Insufficient documentation

## 2020-06-16 DIAGNOSIS — F17200 Nicotine dependence, unspecified, uncomplicated: Secondary | ICD-10-CM | POA: Diagnosis present

## 2020-06-16 DIAGNOSIS — J209 Acute bronchitis, unspecified: Secondary | ICD-10-CM | POA: Insufficient documentation

## 2020-06-16 DIAGNOSIS — F17210 Nicotine dependence, cigarettes, uncomplicated: Secondary | ICD-10-CM | POA: Insufficient documentation

## 2020-06-16 MED ORDER — BENZONATATE 100 MG PO CAPS: 100.0000 mg | ORAL_CAPSULE | Freq: Three times a day (TID) | ORAL | 0 refills | Status: DC

## 2020-06-16 MED ORDER — PREDNISONE 20 MG PO TABS: 40.0000 mg | ORAL_TABLET | Freq: Every day | ORAL | 0 refills | Status: AC

## 2020-06-16 MED ORDER — ONDANSETRON 8 MG PO TBDP: 8.0000 mg | ORAL_TABLET | Freq: Three times a day (TID) | ORAL | 0 refills | Status: DC | PRN

## 2020-06-16 MED ORDER — ALBUTEROL SULFATE HFA 108 (90 BASE) MCG/ACT IN AERS: 1.0000 | INHALATION_SPRAY | Freq: Four times a day (QID) | RESPIRATORY_TRACT | 0 refills | Status: DC | PRN

## 2020-06-16 NOTE — Discharge Instructions (Addendum)
-  Take the Zofran (ondansetron) up to 3 times daily for nausea and vomiting. Dissolve one pill under your tongue or between your teeth and your cheek. -Prednisone, 2 pills taken at the same time for 5 days in a row.  Try taking this earlier in the day as it can give you energy.  Avoid NSAIDs like ibuprofen while on this medication. -Tessalon (Benzonatate) as needed for cough. Take one pill up to 3x daily (every 8 hours) -Albuterol inhaler for cough and wheezing -With a virus, you're typically contagious for 5-7 days, or as long as you're having fevers. Work note provided. -Seek additional immediate medical attention if you develop new symptoms like shortness of breath, dizziness, chest pain, severe abdominal pain

## 2020-06-16 NOTE — ED Triage Notes (Signed)
Pt presents with sore throat, cough and vomiting xs 4 days. States Mucinex gave relief at night.

## 2020-06-16 NOTE — ED Provider Notes (Signed)
[place]    CSN: [id number] Arrival date & time: 06/16/20  4098      History   Chief Complaint Chief Complaint  Patient presents with  . Sore Throat  . Cough  . Emesis    HPI Sally Garner is a 35 y.o. female presenting with sore throat, cough, vomiting x4 days.  She is a current smoker and states she has had recurrent bronchitis due to this.  Also with history of hypertension, migraines, cholelithiasis.  Notes 4 days of nonproductive hacking cough.  Some sore throat, she states that this is due to the cough.  Throwing up 2-3 times in the morning for 4 days in a row, with nausea throughout the day.  Generalized crampy abdominal pain. Denies diarrhea, states bowel movements are regular and she is still passing gas.  Denies urinary symptoms.  Denies fever/chills, shortness of breath, dizziness, chest pain, body aches, headaches.  HPI  Past Medical History:  Diagnosis Date  . Hypertension    was put on medication end of 03/2015; states has caused headache and is not taking as prescribed  . Migraines   . Symptomatic cholelithiasis 04/2015    Patient Active Problem List   Diagnosis Date Noted  . Status post repeat low transverse cesarean section 07/23/2017  . Status post tubal ligation 07/23/2017  . Preexisting hypertension complicating pregnancy, antepartum 07/20/2017  . Pregnancy with poor obstetric history   . Obesity affecting pregnancy in third trimester   . HTN in pregnancy, chronic   . Unwanted fertility 03/23/2017  . Elevated hemoglobin A1c 01/03/2017  . Chronic hypertension during pregnancy, antepartum 01/02/2017  . Supervision of high risk pregnancy, antepartum 12/29/2016  . Pleuritic chest pain 07/12/2013  . Status post primary low transverse cesarean section--FTP, NRFHR 05/26/2013  . Anemia 05/24/2013  . Smoker 05/24/2013  . Fibroid uterus 05/24/2013  . Obesity, unspecified--BMI 37.1 05/24/2013  . Hx of primary hypertension--2-3 years ago  05/24/2013    Past Surgical History:  Procedure Laterality Date  . CESAREAN SECTION N/A 05/25/2013   Procedure: CESAREAN SECTION;  Surgeon: Annalee Genta, DO;  Location: [facility];  Service: Obstetrics;  Laterality: N/A;  . CESAREAN SECTION N/A 07/23/2017   Procedure: CESAREAN SECTION;  Surgeon: Donnamae Jude, MD;  Location: [facility];  Service: Obstetrics;  Laterality: N/A;  . CHOLECYSTECTOMY N/A 05/05/2015   Procedure: LAPAROSCOPIC CHOLECYSTECTOMY;  Surgeon: Coralie Keens, MD;  Location: [facility];  Service: General;  Laterality: N/A;    OB History    Gravida  2   Para  2   Term  2   Preterm      AB      Living  2     SAB      IAB      Ectopic      Multiple  0   Live Births  2            Home Medications    Prior to Admission medications   Medication Sig Start Date End Date Taking? Authorizing Provider  albuterol (VENTOLIN HFA) 108 (90 Base) MCG/ACT inhaler Inhale 1-2 puffs into the lungs every 6 (six) hours as needed for wheezing or shortness of breath. 06/16/20  Yes Hazel Sams, PA-C  benzonatate (TESSALON) 100 MG capsule Take 1 capsule (100 mg total) by mouth every 8 (eight) hours. 06/16/20  Yes Hazel Sams, PA-C  ondansetron (ZOFRAN ODT) 8 MG disintegrating tablet Take 1 tablet (8 mg total) by  mouth every 8 (eight) hours as needed for nausea or vomiting. 06/16/20  Yes Hazel Sams, PA-C  predniSONE (DELTASONE) 20 MG tablet Take 2 tablets (40 mg total) by mouth daily for 5 days. 06/16/20 06/21/20 Yes Hazel Sams, PA-C  famotidine (PEPCID) 20 MG tablet Take 1 tablet (20 mg total) by mouth 2 (two) times daily. 04/29/19   Charlann Lange, PA-C  hydrochlorothiazide (HYDRODIURIL) 12.5 MG tablet Take 1 tablet (12.5 mg total) by mouth daily. 01/10/20   Fransico Meadow, PA-C  promethazine-dextromethorphan (PROMETHAZINE-DM) 6.25-15 MG/5ML syrup Take 5 mLs by mouth at bedtime as needed for cough. 04/11/20   Jaynee Eagles, PA-C  amLODipine  (NORVASC) 10 MG tablet Take 1 tablet (10 mg total) by mouth daily. Patient not taking: Reported on 10/17/2017 07/25/17 01/10/20  Lajean Manes, CNM  ferrous sulfate 325 (65 FE) MG tablet Take 1 tablet (325 mg total) by mouth 3 (three) times daily with meals. Patient not taking: Reported on 10/17/2017 07/25/17 01/10/20  Lajean Manes, CNM    Family History Family History  Problem Relation Age of Onset  . Hypertension Maternal Aunt   . Hypertension Maternal Uncle   . Birth defects Brother        polydactyl  . Cancer Paternal Aunt        breast cancer    Social History Social History   Tobacco Use  . Smoking status: Current Some Day Smoker    Packs/day: 0.50    Years: 3.00    Pack years: 1.50    Types: Cigarettes  . Smokeless tobacco: Never Used  Vaping Use  . Vaping Use: Never used  Substance Use Topics  . Alcohol use: Yes    Comment: occasional  . Drug use: No     Allergies   Patient has no known allergies.   Review of Systems Review of Systems  Constitutional: Negative for appetite change, chills and fever.  HENT: Positive for congestion. Negative for ear pain, rhinorrhea, sinus pressure, sinus pain and sore throat.   Eyes: Negative for redness and visual disturbance.  Respiratory: Positive for cough. Negative for chest tightness, shortness of breath and wheezing.   Cardiovascular: Negative for chest pain and palpitations.  Gastrointestinal: Positive for nausea and vomiting. Negative for abdominal pain, constipation and diarrhea.  Genitourinary: Negative for dysuria, frequency and urgency.  Musculoskeletal: Negative for myalgias.  Neurological: Negative for dizziness, weakness and headaches.  Psychiatric/Behavioral: Negative for confusion.  All other systems reviewed and are negative.    Physical Exam Triage Vital Signs ED Triage Vitals  Enc Vitals Group     BP 06/16/20 1002 132/82     Pulse Rate 06/16/20 1002 100     Resp 06/16/20 1002 18     Temp  06/16/20 1002 99.1 F (37.3 C)     Temp Source 06/16/20 1002 Oral     SpO2 06/16/20 1002 100 %     Weight --      Height --      Head Circumference --      Peak Flow --      Pain Score 06/16/20 1000 0     Pain Loc --      Pain Edu? --      Excl. in [place]? --    No data found.  Updated Vital Signs BP 132/82 (BP Location: Left Arm)   Pulse 100   Temp 99.1 F (37.3 C) (Oral)   Resp 18   LMP 06/16/2020   SpO2  100%   Visual Acuity Right Eye Distance:   Left Eye Distance:   Bilateral Distance:    Right Eye Near:   Left Eye Near:    Bilateral Near:     Physical Exam Vitals reviewed.  Constitutional:      General: She is not in acute distress.    Appearance: Normal appearance. She is not ill-appearing.  HENT:     Head: Normocephalic and atraumatic.     Mouth/Throat:     Mouth: Mucous membranes are moist.     Comments: Moist mucous membranes Eyes:     Extraocular Movements: Extraocular movements intact.     Pupils: Pupils are equal, round, and reactive to light.  Cardiovascular:     Rate and Rhythm: Normal rate and regular rhythm.     Heart sounds: Normal heart sounds.  Pulmonary:     Effort: Pulmonary effort is normal.     Breath sounds: Normal breath sounds. No decreased breath sounds, wheezing, rhonchi or rales.     Comments: Frequent hacking cough Abdominal:     General: Bowel sounds are normal. There is no distension.     Palpations: Abdomen is soft. There is no mass.     Tenderness: There is no abdominal tenderness. There is no right CVA tenderness, left CVA tenderness, guarding or rebound.  Skin:    General: Skin is warm.     Capillary Refill: Capillary refill takes less than 2 seconds.     Comments: Good skin turgor  Neurological:     General: No focal deficit present.     Mental Status: She is alert and oriented to person, place, and time.  Psychiatric:        Mood and Affect: Mood normal.        Behavior: Behavior normal.      UC Treatments /  Results  Labs (all labs ordered are listed, but only abnormal results are displayed) Labs Reviewed  SARS CORONAVIRUS 2 (TAT 6-24 HRS)    EKG   Radiology No results found.  Procedures Procedures (including critical care time)  Medications Ordered in UC Medications - No data to display  Initial Impression / Assessment and Plan / UC Course  I have reviewed the triage vital signs and the nursing notes.  Pertinent labs & imaging results that were available during my care of the patient were reviewed by me and considered in my medical decision making (see chart for details).     This patient is a 35 year old female presenting with acute bronchitis, she is a current smoker. Today this pt is afebrile nontachycardic nontachypneic, oxygenating well on room air, no wheezes rhonchi or rales.   Prednisone, albuterol, Tessalon as below.  She is not a diabetic.  Also sent Zofran ODT for nausea component. Rec good hydration, BRAT diet. precontemplative of smoking cessation.  COVID PCR sent. Tubal ligation for birth control.  ED return precautions discussed.  Final Clinical Impressions(s) / UC Diagnoses   Final diagnoses:  Acute bronchitis, unspecified organism  Current smoker  Viral URI with cough  Non-intractable vomiting with nausea, unspecified vomiting type  History of bilateral tubal ligation  Encounter for screening for COVID-19     Discharge Instructions     -Take the Zofran (ondansetron) up to 3 times daily for nausea and vomiting. Dissolve one pill under your tongue or between your teeth and your cheek. -Prednisone, 2 pills taken at the same time for 5 days in a row.  Try taking this earlier in  the day as it can give you energy.  Avoid NSAIDs like ibuprofen while on this medication. -Tessalon (Benzonatate) as needed for cough. Take one pill up to 3x daily (every 8 hours) -Albuterol inhaler for cough and wheezing -With a virus, you're typically contagious for 5-7 days, or  as long as you're having fevers. Work note provided. -Seek additional immediate medical attention if you develop new symptoms like shortness of breath, dizziness, chest pain, severe abdominal pain    ED Prescriptions    Medication Sig Dispense Auth. Provider   albuterol (VENTOLIN HFA) 108 (90 Base) MCG/ACT inhaler Inhale 1-2 puffs into the lungs every 6 (six) hours as needed for wheezing or shortness of breath. 1 each Hazel Sams, PA-C   benzonatate (TESSALON) 100 MG capsule Take 1 capsule (100 mg total) by mouth every 8 (eight) hours. 21 capsule Hazel Sams, PA-C   predniSONE (DELTASONE) 20 MG tablet Take 2 tablets (40 mg total) by mouth daily for 5 days. 10 tablet Hazel Sams, PA-C   ondansetron (ZOFRAN ODT) 8 MG disintegrating tablet Take 1 tablet (8 mg total) by mouth every 8 (eight) hours as needed for nausea or vomiting. 20 tablet Hazel Sams, PA-C     PDMP not reviewed this encounter.   Hazel Sams, PA-C 06/16/20 1044

## 2020-06-17 LAB — SARS CORONAVIRUS 2 (TAT 6-24 HRS): SARS Coronavirus 2: NEGATIVE

## 2020-09-30 ENCOUNTER — Ambulatory Visit: Payer: Medicaid Other | Admitting: Nurse Practitioner

## 2020-10-17 ENCOUNTER — Ambulatory Visit (HOSPITAL_COMMUNITY)
Admission: EM | Admit: 2020-10-17 | Discharge: 2020-10-17 | Disposition: A | Discharge status: Home / Self Care | Payer: Medicaid Other | Attending: Family Medicine | Admitting: Family Medicine

## 2020-10-17 ENCOUNTER — Other Ambulatory Visit: Payer: Self-pay

## 2020-10-17 ENCOUNTER — Encounter (HOSPITAL_COMMUNITY): Payer: Self-pay | Admitting: *Deleted

## 2020-10-17 DIAGNOSIS — L2089 Other atopic dermatitis: Secondary | ICD-10-CM | POA: Diagnosis not present

## 2020-10-17 MED ORDER — DEXAMETHASONE SODIUM PHOSPHATE 10 MG/ML IJ SOLN
INTRAMUSCULAR | Status: AC
  Filled 2020-10-17: qty 1

## 2020-10-17 MED ORDER — DEXAMETHASONE SODIUM PHOSPHATE 10 MG/ML IJ SOLN
10.0000 mg | Freq: Once | INTRAMUSCULAR | Status: AC
  Administered 2020-10-17: 10 mg via INTRAMUSCULAR

## 2020-10-17 MED ORDER — TRIAMCINOLONE ACETONIDE 0.1 % EX CREA: 1.0000 "application " | TOPICAL_CREAM | Freq: Two times a day (BID) | CUTANEOUS | 1 refills | Status: DC

## 2020-10-17 NOTE — ED Triage Notes (Signed)
Pt reports Ezcema flare up

## 2020-10-17 NOTE — ED Provider Notes (Signed)
[place]    CSN: [id number] Arrival date & time: 10/17/20  1647      History   Chief Complaint Chief Complaint  Patient presents with   Eczema    HPI Sally Garner is a 35 y.o. female.   Patient presenting today with diffuse eczema flare bilateral upper extremities, abdomen.  She states that the flare started months ago but she has been unable to get in with a dermatologist.  Using Eucerin cream and over-the-counter topical steroids with minimal relief.  Significant itching but no bleeding, redness, chest tightness, wheezing.  Past Medical History:  Diagnosis Date   Hypertension    was put on medication end of 03/2015; states has caused headache and is not taking as prescribed   Migraines    Symptomatic cholelithiasis 04/2015   Patient Active Problem List   Diagnosis Date Noted   Status post repeat low transverse cesarean section 07/23/2017   Status post tubal ligation 07/23/2017   Preexisting hypertension complicating pregnancy, antepartum 07/20/2017   Pregnancy with poor obstetric history    Obesity affecting pregnancy in third trimester    HTN in pregnancy, chronic    Unwanted fertility 03/23/2017   Elevated hemoglobin A1c 01/03/2017   Chronic hypertension during pregnancy, antepartum 01/02/2017   Supervision of high risk pregnancy, antepartum 12/29/2016   Pleuritic chest pain 07/12/2013   Status post primary low transverse cesarean section--FTP, NRFHR 05/26/2013   Anemia 05/24/2013   Smoker 05/24/2013   Fibroid uterus 05/24/2013   Obesity, unspecified--BMI 37.1 05/24/2013   Hx of primary hypertension--2-3 years ago 05/24/2013   Past Surgical History:  Procedure Laterality Date   CESAREAN SECTION N/A 05/25/2013   Procedure: CESAREAN SECTION;  Surgeon: Annalee Genta, DO;  Location: [facility];  Service: Obstetrics;  Laterality: N/A;   CESAREAN SECTION N/A 07/23/2017   Procedure: CESAREAN SECTION;  Surgeon: Donnamae Jude, MD;  Location: [facility];  Service: Obstetrics;  Laterality: N/A;   CHOLECYSTECTOMY N/A 05/05/2015   Procedure: LAPAROSCOPIC CHOLECYSTECTOMY;  Surgeon: Coralie Keens, MD;  Location: [facility];  Service: General;  Laterality: N/A;   OB History     Gravida  2   Para  2   Term  2   Preterm      AB      Living  2      SAB      IAB      Ectopic      Multiple  0   Live Births  2          Home Medications    Prior to Admission medications   Medication Sig Start Date End Date Taking? Authorizing Provider  triamcinolone cream (KENALOG) 0.1 % Apply 1 application topically 2 (two) times daily. 10/17/20  Yes Volney American, PA-C  albuterol (VENTOLIN HFA) 108 (90 Base) MCG/ACT inhaler Inhale 1-2 puffs into the lungs every 6 (six) hours as needed for wheezing or shortness of breath. 06/16/20   Hazel Sams, PA-C  benzonatate (TESSALON) 100 MG capsule Take 1 capsule (100 mg total) by mouth every 8 (eight) hours. 06/16/20   Hazel Sams, PA-C  famotidine (PEPCID) 20 MG tablet Take 1 tablet (20 mg total) by mouth 2 (two) times daily. 04/29/19   Charlann Lange, PA-C  hydrochlorothiazide (HYDRODIURIL) 12.5 MG tablet Take 1 tablet (12.5 mg total) by mouth daily. 01/10/20   Fransico Meadow, PA-C  ondansetron (ZOFRAN ODT) 8 MG disintegrating tablet Take 1 tablet (8  mg total) by mouth every 8 (eight) hours as needed for nausea or vomiting. 06/16/20   Hazel Sams, PA-C  promethazine-dextromethorphan (PROMETHAZINE-DM) 6.25-15 MG/5ML syrup Take 5 mLs by mouth at bedtime as needed for cough. 04/11/20   Jaynee Eagles, PA-C  amLODipine (NORVASC) 10 MG tablet Take 1 tablet (10 mg total) by mouth daily. Patient not taking: Reported on 10/17/2017 07/25/17 01/10/20  Lajean Manes, CNM  ferrous sulfate 325 (65 FE) MG tablet Take 1 tablet (325 mg total) by mouth 3 (three) times daily with meals. Patient not taking: Reported on 10/17/2017 07/25/17 01/10/20  Lajean Manes, CNM   Family  History Family History  Problem Relation Age of Onset   Birth defects Brother        polydactyl   Hypertension Maternal Aunt    Hypertension Maternal Uncle    Cancer Paternal Aunt        breast cancer   Social History Social History   Tobacco Use   Smoking status: Some Days    Packs/day: 0.50    Years: 3.00    Pack years: 1.50    Types: Cigarettes   Smokeless tobacco: Never  Vaping Use   Vaping Use: Never used  Substance Use Topics   Alcohol use: Yes    Comment: occasional   Drug use: No   Allergies   Patient has no known allergies.  Review of Systems Review of Systems Per HPI  Physical Exam Triage Vital Signs ED Triage Vitals  Enc Vitals Group     BP 10/17/20 1747 (!) 133/91     Pulse Rate 10/17/20 1747 (!) 101     Resp 10/17/20 1747 18     Temp 10/17/20 1747 98.2 F (36.8 C)     Temp src --      SpO2 10/17/20 1747 100 %     Weight --      Height --      Head Circumference --      Peak Flow --      Pain Score 10/17/20 1748 0     Pain Loc --      Pain Edu? --      Excl. in [place]? --    No data found.  Updated Vital Signs BP (!) 133/91   Pulse (!) 101   Temp 98.2 F (36.8 C)   Resp 18   LMP 09/27/2020   SpO2 100%   Visual Acuity Right Eye Distance:   Left Eye Distance:   Bilateral Distance:    Right Eye Near:   Left Eye Near:    Bilateral Near:     Physical Exam Vitals and nursing note reviewed.  Constitutional:      Appearance: Normal appearance. She is not ill-appearing.  HENT:     Head: Atraumatic.  Eyes:     Extraocular Movements: Extraocular movements intact.     Conjunctiva/sclera: Conjunctivae normal.  Cardiovascular:     Rate and Rhythm: Normal rate and regular rhythm.     Heart sounds: Normal heart sounds.  Pulmonary:     Effort: Pulmonary effort is normal.     Breath sounds: Normal breath sounds.  Musculoskeletal:        General: Normal range of motion.     Cervical back: Normal range of motion and neck supple.  Skin:     General: Skin is warm and dry.     Findings: Rash present.     Comments: Significant hyperpigmented, lichenified rash bilateral upper extremities  worse on the right hand.  Papular rash across abdomen  Neurological:     Mental Status: She is alert and oriented to person, place, and time.  Psychiatric:        Mood and Affect: Mood normal.        Thought Content: Thought content normal.        Judgment: Judgment normal.   UC Treatments / Results  Labs (all labs ordered are listed, but only abnormal results are displayed) Labs Reviewed - No data to display  EKG   Radiology No results found.  Procedures Procedures (including critical care time)  Medications Ordered in UC Medications  dexamethasone (DECADRON) injection 10 mg (has no administration in time range)    Initial Impression / Assessment and Plan / UC Course  I have reviewed the triage vital signs and the nursing notes.  Pertinent labs & imaging results that were available during my care of the patient were reviewed by me and considered in my medical decision making (see chart for details).     IM Decadron given in clinic given the extent and diffuse nature of her atopic dermatitis.  Large supply of triamcinolone sent to pharmacy in hopes that this will last her until she can get in with dermatology in several months.  Follow-up if worsening or not resolving in the meantime.  No evidence of secondary bacterial infection at this time.  Final Clinical Impressions(s) / UC Diagnoses   Final diagnoses:  Other atopic dermatitis   Discharge Instructions   None    ED Prescriptions     Medication Sig Dispense Auth. Provider   triamcinolone cream (KENALOG) 0.1 % Apply 1 application topically 2 (two) times daily. 453.6 g Volney American, [place]      PDMP not reviewed this encounter.   Volney American, [place] 10/17/20 [id number]

## 2020-11-28 ENCOUNTER — Emergency Department (HOSPITAL_COMMUNITY): Payer: Medicaid Other

## 2020-11-28 ENCOUNTER — Emergency Department (HOSPITAL_COMMUNITY)
Admission: EM | Admit: 2020-11-28 | Discharge: 2020-11-28 | Disposition: A | Discharge status: Home / Self Care | Payer: Medicaid Other | Attending: Emergency Medicine | Admitting: Emergency Medicine

## 2020-11-28 ENCOUNTER — Other Ambulatory Visit: Payer: Self-pay

## 2020-11-28 ENCOUNTER — Encounter (HOSPITAL_COMMUNITY): Payer: Self-pay | Admitting: *Deleted

## 2020-11-28 DIAGNOSIS — R079 Chest pain, unspecified: Secondary | ICD-10-CM | POA: Diagnosis not present

## 2020-11-28 DIAGNOSIS — R1110 Vomiting, unspecified: Secondary | ICD-10-CM | POA: Insufficient documentation

## 2020-11-28 DIAGNOSIS — R059 Cough, unspecified: Secondary | ICD-10-CM | POA: Insufficient documentation

## 2020-11-28 DIAGNOSIS — Z5321 Procedure and treatment not carried out due to patient leaving prior to being seen by health care provider: Secondary | ICD-10-CM | POA: Insufficient documentation

## 2020-11-28 LAB — COMPREHENSIVE METABOLIC PANEL
ALT: 14 U/L (ref 0–44)
AST: 28 U/L (ref 15–41)
Albumin: 3.4 g/dL — ABNORMAL LOW (ref 3.5–5.0)
Alkaline Phosphatase: 60 U/L (ref 38–126)
Anion gap: 6 (ref 5–15)
BUN: <5 mg/dL — ABNORMAL LOW (ref 6–20)
CO2: 28 mmol/L (ref 22–32)
Calcium: 8.8 mg/dL — ABNORMAL LOW (ref 8.9–10.3)
Chloride: 99 mmol/L (ref 98–111)
Creatinine, Ser: 0.79 mg/dL (ref 0.44–1.00)
GFR, Estimated: >60 mL/min (ref >60)
Glucose, Bld: 135 mg/dL — ABNORMAL HIGH (ref 70–99)
Potassium: 3.8 mmol/L (ref 3.5–5.1)
Sodium: 133 mmol/L — ABNORMAL LOW (ref 135–145)
Total Bilirubin: 0.9 mg/dL (ref 0.3–1.2)
Total Protein: 7.2 g/dL (ref 6.5–8.1)

## 2020-11-28 LAB — CBC WITH DIFFERENTIAL/PLATELET
Abs Immature Granulocytes: 0.01 10*3/uL (ref 0.00–0.07)
Basophils Absolute: 0 10*3/uL (ref 0.0–0.1)
Basophils Relative: 1 %
Eosinophils Absolute: 0.3 10*3/uL (ref 0.0–0.5)
Eosinophils Relative: 5 %
HCT: 33.8 % — ABNORMAL LOW (ref 36.0–46.0)
Hemoglobin: 10.5 g/dL — ABNORMAL LOW (ref 12.0–15.0)
Immature Granulocytes: 0 %
Lymphocytes Relative: 37 %
Lymphs Abs: 2 10*3/uL (ref 0.7–4.0)
MCH: 25.2 pg — ABNORMAL LOW (ref 26.0–34.0)
MCHC: 31.1 g/dL (ref 30.0–36.0)
MCV: 81.3 fL (ref 80.0–100.0)
Monocytes Absolute: 0.8 10*3/uL (ref 0.1–1.0)
Monocytes Relative: 14 %
Neutro Abs: 2.5 10*3/uL (ref 1.7–7.7)
Neutrophils Relative %: 43 %
Platelets: 346 10*3/uL (ref 150–400)
RBC: 4.16 MIL/uL (ref 3.87–5.11)
RDW: 14.2 % (ref 11.5–15.5)
WBC: 5.5 10*3/uL (ref 4.0–10.5)
nRBC: 0 % (ref 0.0–0.2)

## 2020-11-28 LAB — LIPASE, BLOOD: Lipase: 28 U/L (ref 11–51)

## 2020-11-28 LAB — I-STAT BETA HCG BLOOD, ED (MC, WL, AP ONLY): I-stat hCG, quantitative: <5 m[IU]/mL (ref <5)

## 2020-11-28 NOTE — ED Provider Notes (Signed)
Emergency Medicine Provider Triage Evaluation Note  Thera Basden , a 35 y.o. female  was evaluated in triage.  Pt complains of feeling unwell.  Cough NBNB emesis, pain in her chest.  Pain is nonexertional, nonpleuritic.  Worse with coughing.  No history of lung or cardiac issues.  Symptoms over the last 5 days patient is taking OTC medications.  No lower extremity edema, erythema or warmth. AT home covid test neg yesterday  Review of Systems  Positive: Cough, emesis, chest pain Negative: Fever, back pain, abdominal pain, diarrhea  Physical Exam  Ht 5\' 4"  (1.626 m)   Wt 99.8 kg   BMI 37.77 kg/m  Gen:   Awake, no distress   Resp:  Normal effort  MSK:   Moves extremities without difficulty  ABD:  Soft non tender SKIN:  No Le edema, erythema Other:    Medical Decision Making  Medically screening exam initiated at 4:41 PM.  Appropriate orders placed.  Joleigh Mineau was informed that the remainder of the evaluation will be completed by another provider, this initial triage assessment does not replace that evaluation, and the importance of remaining in the ED until their evaluation is complete.  Cough, emesis, CP   Viridiana Spaid A, PA-C 11/28/20 1643    Daleen Bo, MD 11/29/20 [phone]

## 2020-11-28 NOTE — ED Notes (Signed)
Pt not seen quick enough and decided to leave

## 2020-11-28 NOTE — ED Triage Notes (Signed)
The pt has had a cough vomiting pain in her chest when she coughs no fever l  for 5 days   lmp sept 22

## 2021-01-18 ENCOUNTER — Encounter: Payer: Medicaid Other | Admitting: Obstetrics & Gynecology

## 2021-01-19 ENCOUNTER — Encounter (HOSPITAL_COMMUNITY): Payer: Self-pay | Admitting: Emergency Medicine

## 2021-01-19 ENCOUNTER — Emergency Department (HOSPITAL_COMMUNITY): Payer: Medicaid Other

## 2021-01-19 ENCOUNTER — Emergency Department (HOSPITAL_COMMUNITY)
Admission: EM | Admit: 2021-01-19 | Discharge: 2021-01-19 | Disposition: A | Discharge status: Home / Self Care | Payer: Medicaid Other | Attending: Emergency Medicine | Admitting: Emergency Medicine

## 2021-01-19 DIAGNOSIS — Z28310 Unvaccinated for covid-19: Secondary | ICD-10-CM | POA: Insufficient documentation

## 2021-01-19 DIAGNOSIS — R509 Fever, unspecified: Secondary | ICD-10-CM | POA: Diagnosis not present

## 2021-01-19 DIAGNOSIS — I10 Essential (primary) hypertension: Secondary | ICD-10-CM | POA: Diagnosis not present

## 2021-01-19 DIAGNOSIS — M7918 Myalgia, other site: Secondary | ICD-10-CM | POA: Insufficient documentation

## 2021-01-19 DIAGNOSIS — R059 Cough, unspecified: Secondary | ICD-10-CM | POA: Diagnosis not present

## 2021-01-19 DIAGNOSIS — Z20822 Contact with and (suspected) exposure to covid-19: Secondary | ICD-10-CM | POA: Diagnosis not present

## 2021-01-19 DIAGNOSIS — J189 Pneumonia, unspecified organism: Secondary | ICD-10-CM

## 2021-01-19 DIAGNOSIS — Z79899 Other long term (current) drug therapy: Secondary | ICD-10-CM | POA: Diagnosis not present

## 2021-01-19 DIAGNOSIS — F17210 Nicotine dependence, cigarettes, uncomplicated: Secondary | ICD-10-CM | POA: Insufficient documentation

## 2021-01-19 DIAGNOSIS — R0981 Nasal congestion: Secondary | ICD-10-CM | POA: Diagnosis not present

## 2021-01-19 DIAGNOSIS — R000 Tachycardia, unspecified: Secondary | ICD-10-CM | POA: Insufficient documentation

## 2021-01-19 LAB — BASIC METABOLIC PANEL
Anion gap: 9 (ref 5–15)
BUN: 5 mg/dL — ABNORMAL LOW (ref 6–20)
CO2: 23 mmol/L (ref 22–32)
Calcium: 8.7 mg/dL — ABNORMAL LOW (ref 8.9–10.3)
Chloride: 102 mmol/L (ref 98–111)
Creatinine, Ser: 0.7 mg/dL (ref 0.44–1.00)
GFR, Estimated: >60 mL/min (ref >60)
Glucose, Bld: 97 mg/dL (ref 70–99)
Potassium: 3.4 mmol/L — ABNORMAL LOW (ref 3.5–5.1)
Sodium: 134 mmol/L — ABNORMAL LOW (ref 135–145)

## 2021-01-19 LAB — CBC WITH DIFFERENTIAL/PLATELET
Abs Immature Granulocytes: 0.03 10*3/uL (ref 0.00–0.07)
Basophils Absolute: 0 10*3/uL (ref 0.0–0.1)
Basophils Relative: 0 %
Eosinophils Absolute: 0.2 10*3/uL (ref 0.0–0.5)
Eosinophils Relative: 2 %
HCT: 34.3 % — ABNORMAL LOW (ref 36.0–46.0)
Hemoglobin: 10.8 g/dL — ABNORMAL LOW (ref 12.0–15.0)
Immature Granulocytes: 0 %
Lymphocytes Relative: 15 %
Lymphs Abs: 1.2 10*3/uL (ref 0.7–4.0)
MCH: 24.8 pg — ABNORMAL LOW (ref 26.0–34.0)
MCHC: 31.5 g/dL (ref 30.0–36.0)
MCV: 78.7 fL — ABNORMAL LOW (ref 80.0–100.0)
Monocytes Absolute: 0.6 10*3/uL (ref 0.1–1.0)
Monocytes Relative: 8 %
Neutro Abs: 6.2 10*3/uL (ref 1.7–7.7)
Neutrophils Relative %: 75 %
Platelets: 303 10*3/uL (ref 150–400)
RBC: 4.36 MIL/uL (ref 3.87–5.11)
RDW: 14.6 % (ref 11.5–15.5)
WBC: 8.3 10*3/uL (ref 4.0–10.5)
nRBC: 0 % (ref 0.0–0.2)

## 2021-01-19 LAB — TROPONIN I (HIGH SENSITIVITY)
Troponin I (High Sensitivity): 3 ng/L (ref <18)
Troponin I (High Sensitivity): 3 ng/L (ref <18)

## 2021-01-19 LAB — RESP PANEL BY RT-PCR (FLU A&B, COVID) ARPGX2
Influenza A by PCR: NEGATIVE
Influenza B by PCR: NEGATIVE
SARS Coronavirus 2 by RT PCR: NEGATIVE

## 2021-01-19 LAB — D-DIMER, QUANTITATIVE: D-Dimer, Quant: 0.76 ug/mL-FEU — ABNORMAL HIGH (ref 0.00–0.50)

## 2021-01-19 MED ORDER — AZITHROMYCIN 250 MG PO TABS: 250.0000 mg | ORAL_TABLET | Freq: Every day | ORAL | 0 refills | Status: DC

## 2021-01-19 MED ORDER — IOHEXOL 350 MG/ML SOLN
80.0000 mL | Freq: Once | INTRAVENOUS | Status: AC | PRN
  Administered 2021-01-19: 80 mL via INTRAVENOUS

## 2021-01-19 MED ORDER — SODIUM CHLORIDE 0.9 % IV BOLUS
1000.0000 mL | Freq: Once | INTRAVENOUS | Status: AC
Start: 2021-01-19 — End: 2021-01-19
  Administered 2021-01-19: 1000 mL via INTRAVENOUS

## 2021-01-19 MED ORDER — ACETAMINOPHEN 500 MG PO TABS
1000.0000 mg | ORAL_TABLET | Freq: Once | ORAL | Status: AC
  Administered 2021-01-19: 1000 mg via ORAL

## 2021-01-19 MED ORDER — AMOXICILLIN-POT CLAVULANATE 875-125 MG PO TABS: 1.0000 | ORAL_TABLET | Freq: Two times a day (BID) | ORAL | 0 refills | Status: DC

## 2021-01-19 NOTE — ED Triage Notes (Signed)
Per pt, states cough, chills, body aches, back pain since last week-states her children have been sick-has not been tested for flu or covid

## 2021-01-19 NOTE — Discharge Instructions (Addendum)
Take Augmentin twice daily for neck 7 days.  Take azithromycin twice daily the first day, after that take it once daily until the package is completed.  Please complete both antibiotics even if you start to feel better.  May return to work next week on Monday if you are feeling better.  Return to the ED if you are unable to keep medicine down or if things change or worsen severely.

## 2021-01-19 NOTE — ED Provider Notes (Signed)
[facility] DEPT Provider Note   CSN: [id number] Arrival date & time: 01/19/21  0857     History Chief Complaint  Patient presents with   flu like symptoms    Sally Dodgen is a 35 y.o. female.  HPI  Patient with history of migraines and hypertension presents with flulike symptoms.  Symptoms started acutely on Friday, they include body aches, cough, fever, chills, nasal congestion.  The symptoms are intermittent, she has tried 1 allergy pill which has not improved the nasal congestion.  She is having pain in her back, does endorse some pain in her chest but only when she coughs.  Not worsened by breathing, not feeling short of breath.  She did have 1 episode of nausea and 1 episode of emesis yesterday, has not had any since then.  Denies any abdominal pain, her kids are sick at home with similar symptoms.  Patient has not had the COVID or flu vaccine.  No recent travel or surgery, no history of previous blood clots, not on any oral birth control.  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  She is a daily cigarette smoker, using albuterol inhaler last yesterday night.  Does not feel short of breath currently or since then.  Past Medical History:  Diagnosis Date   Hypertension    was put on medication end of 03/2015; states has caused headache and is not taking as prescribed   Migraines    Symptomatic cholelithiasis 04/2015    Patient Active Problem List   Diagnosis Date Noted   Status post repeat low transverse cesarean section 07/23/2017   Status post tubal ligation 07/23/2017   Preexisting hypertension complicating pregnancy, antepartum 07/20/2017   Pregnancy with poor obstetric history    Obesity affecting pregnancy in third trimester    HTN in pregnancy, chronic    Unwanted fertility 03/23/2017   Elevated hemoglobin A1c 01/03/2017   Chronic hypertension during pregnancy, antepartum 01/02/2017   Supervision of high risk  pregnancy, antepartum 12/29/2016   Pleuritic chest pain 07/12/2013   Status post primary low transverse cesarean section--FTP, NRFHR 05/26/2013   Anemia 05/24/2013   Smoker 05/24/2013   Fibroid uterus 05/24/2013   Obesity, unspecified--BMI 37.1 05/24/2013   Hx of primary hypertension--2-3 years ago 05/24/2013    Past Surgical History:  Procedure Laterality Date   CESAREAN SECTION N/A 05/25/2013   Procedure: CESAREAN SECTION;  Surgeon: Annalee Genta, DO;  Location: [facility];  Service: Obstetrics;  Laterality: N/A;   CESAREAN SECTION N/A 07/23/2017   Procedure: CESAREAN SECTION;  Surgeon: Donnamae Jude, MD;  Location: [facility];  Service: Obstetrics;  Laterality: N/A;   CHOLECYSTECTOMY N/A 05/05/2015   Procedure: LAPAROSCOPIC CHOLECYSTECTOMY;  Surgeon: Coralie Keens, MD;  Location: [facility];  Service: General;  Laterality: N/A;     OB History     Gravida  2   Para  2   Term  2   Preterm      AB      Living  2      SAB      IAB      Ectopic      Multiple  0   Live Births  2           Family History  Problem Relation Age of Onset   Birth defects Brother        polydactyl   Hypertension Maternal Aunt    Hypertension Maternal Uncle  Cancer Paternal Aunt        breast cancer    Social History   Tobacco Use   Smoking status: Some Days    Packs/day: 0.50    Years: 3.00    Pack years: 1.50    Types: Cigarettes   Smokeless tobacco: Never  Vaping Use   Vaping Use: Never used  Substance Use Topics   Alcohol use: Yes    Comment: occasional   Drug use: No    Home Medications Prior to Admission medications   Medication Sig Start Date End Date Taking? Authorizing Provider  albuterol (VENTOLIN HFA) 108 (90 Base) MCG/ACT inhaler Inhale 1-2 puffs into the lungs every 6 (six) hours as needed for wheezing or shortness of breath. 06/16/20   Hazel Sams, PA-C  benzonatate (TESSALON) 100 MG capsule Take 1 capsule (100 mg total) by  mouth every 8 (eight) hours. 06/16/20   Hazel Sams, PA-C  famotidine (PEPCID) 20 MG tablet Take 1 tablet (20 mg total) by mouth 2 (two) times daily. 04/29/19   Charlann Lange, PA-C  hydrochlorothiazide (HYDRODIURIL) 12.5 MG tablet Take 1 tablet (12.5 mg total) by mouth daily. 01/10/20   Fransico Meadow, PA-C  ondansetron (ZOFRAN ODT) 8 MG disintegrating tablet Take 1 tablet (8 mg total) by mouth every 8 (eight) hours as needed for nausea or vomiting. 06/16/20   Hazel Sams, PA-C  promethazine-dextromethorphan (PROMETHAZINE-DM) 6.25-15 MG/5ML syrup Take 5 mLs by mouth at bedtime as needed for cough. 04/11/20   Jaynee Eagles, PA-C  triamcinolone cream (KENALOG) 0.1 % Apply 1 application topically 2 (two) times daily. 10/17/20   Volney American, PA-C  amLODipine (NORVASC) 10 MG tablet Take 1 tablet (10 mg total) by mouth daily. Patient not taking: Reported on 10/17/2017 07/25/17 01/10/20  Lajean Manes, CNM  ferrous sulfate 325 (65 FE) MG tablet Take 1 tablet (325 mg total) by mouth 3 (three) times daily with meals. Patient not taking: Reported on 10/17/2017 07/25/17 01/10/20  Lajean Manes, CNM    Allergies    Patient has no known allergies.  Review of Systems   Review of Systems  Constitutional:  Positive for fever.  HENT:  Positive for congestion. Negative for sore throat.   Respiratory:  Positive for cough. Negative for shortness of breath.   Cardiovascular:  Negative for palpitations and leg swelling.  Gastrointestinal:  Positive for nausea and vomiting. Negative for abdominal pain.  Musculoskeletal:  Positive for myalgias.  Neurological:  Positive for headaches.   Physical Exam Updated Vital Signs BP 110/88   Pulse (!) 112   Temp 98.5 F (36.9 C)   Resp 20   SpO2 93%   Physical Exam Vitals and nursing note reviewed. Exam conducted with a chaperone present.  Constitutional:      Appearance: Normal appearance.     Comments: Patient appears ill, not in acute distress  or toxic looking.    HENT:     Head: Normocephalic.     Nose: Congestion present.     Mouth/Throat:     Pharynx: No posterior oropharyngeal erythema.  Eyes:     Extraocular Movements: Extraocular movements intact.     Pupils: Pupils are equal, round, and reactive to light.  Cardiovascular:     Rate and Rhythm: Regular rhythm. Tachycardia present.     Comments: Patient is tachycardic, radial pulses 2+ and equal bilaterally.  No murmurs auscultated Pulmonary:     Effort: Pulmonary effort is normal.  Breath sounds: Normal breath sounds.     Comments: Lungs CTA bilaterally. No accessory muscle use. Speaking in complete sentences.  Abdominal:     General: Abdomen is flat.     Palpations: Abdomen is soft.  Musculoskeletal:     Cervical back: Normal range of motion.  Neurological:     Mental Status: She is alert.  Psychiatric:        Mood and Affect: Mood normal.    ED Results / Procedures / Treatments   Labs (all labs ordered are listed, but only abnormal results are displayed) Labs Reviewed  BASIC METABOLIC PANEL - Abnormal; Notable for the following components:      Result Value   Sodium 134 (*)    Potassium 3.4 (*)    BUN 5 (*)    Calcium 8.7 (*)    All other components within normal limits  CBC WITH DIFFERENTIAL/PLATELET - Abnormal; Notable for the following components:   Hemoglobin 10.8 (*)    HCT 34.3 (*)    MCV 78.7 (*)    MCH 24.8 (*)    All other components within normal limits  D-DIMER, QUANTITATIVE - Abnormal; Notable for the following components:   D-Dimer, Quant 0.76 (*)    All other components within normal limits  RESP PANEL BY RT-PCR (FLU A&B, COVID) ARPGX2  TROPONIN I (HIGH SENSITIVITY)  TROPONIN I (HIGH SENSITIVITY)    EKG None  Radiology DG Chest 2 View  Result Date: 01/19/2021 CLINICAL DATA:  Coughing congestion. EXAM: CHEST - 2 VIEW COMPARISON:  09/28/2020 FINDINGS: The lungs are clear without focal pneumonia, edema, pneumothorax or pleural  effusion. The cardiopericardial silhouette is within normal limits for size. The visualized bony structures of the thorax show no acute abnormality. IMPRESSION: No active cardiopulmonary disease. Electronically Signed   By: Misty Stanley M.D.   On: 01/19/2021 09:41   CT Angio Chest PE W/Cm &/Or Wo Cm  Result Date: 01/19/2021 CLINICAL DATA:  Positive D-dimer level. EXAM: CT ANGIOGRAPHY CHEST WITH CONTRAST TECHNIQUE: Multidetector CT imaging of the chest was performed using the standard protocol during bolus administration of intravenous contrast. Multiplanar CT image reconstructions and MIPs were obtained to evaluate the vascular anatomy. CONTRAST:  20mL OMNIPAQUE IOHEXOL 350 MG/ML SOLN COMPARISON:  October 17, 2017. FINDINGS: Cardiovascular: Satisfactory opacification of the pulmonary arteries to the segmental level. No evidence of pulmonary embolism. Normal heart size. No pericardial effusion. Mediastinum/Nodes: The esophagus is unremarkable. Thyroid gland is unremarkable. Right hilar adenopathy is noted with the largest lymph node measuring 16 mm. 17 mm anterior mediastinal lymph node is noted. 10 mm left hilar lymph node is noted. Lungs/Pleura: No pneumothorax or pleural effusion is noted. Patchy airspace opacities are noted bilaterally concerning for multifocal pneumonia. Upper Abdomen: No acute abnormality. Musculoskeletal: No chest wall abnormality. No acute or significant osseous findings. Review of the MIP images confirms the above findings. IMPRESSION: No definite evidence of pulmonary embolus. Patchy airspace opacities are noted bilaterally concerning for multifocal pneumonia, right greater than left. Mediastinal adenopathy is noted which most likely is inflammatory in etiology given the presence of multifocal pneumonia. Electronically Signed   By: Marijo Conception M.D.   On: 01/19/2021 16:18    Procedures Procedures   Medications Ordered in ED Medications  acetaminophen (TYLENOL) tablet 1,000 mg  (1,000 mg Oral Given 01/19/21 1200)  sodium chloride 0.9 % bolus 1,000 mL (1,000 mLs Intravenous New Bag/Given 01/19/21 1544)  iohexol (OMNIPAQUE) 350 MG/ML injection 80 mL (80 mLs Intravenous Contrast Given  01/19/21 1601)    ED Course  I have reviewed the triage vital signs and the nursing notes.  Pertinent labs & imaging results that were available during my care of the patient were reviewed by me and considered in my medical decision making (see chart for details).    MDM Rules/Calculators/A&P                           Patient well-appearing and tachycardic.  Not in any acute distress, is having pleuritic chest pain so I think it is reasonable to check a dimer if the COVID and flu test is negative.  Symptoms are most consistent with a URI, vitals are stable and she is not hypoxic or in respiratory distress.  Radiograph was ordered in triage and negative for pneumonia per the read.  Lab work ordered due to the chest pain and negative viral panel.  Overall reassuring, there was an elevated dimer so CTA was ordered.  Patient does not have findings consistent with a PE, does have multifocal pneumonia.  Negative delta Trop, doubt ACS.  No gross electrolyte derangement contributing to her symptoms.  We will start patient on antibiotics for PNA.  Final Clinical Impression(s) / ED Diagnoses Final diagnoses:  None    Rx / DC Orders ED Discharge Orders     None        Sherrill Raring, [place] 01/19/21 1844    Valarie Merino, MD 01/24/21 2257

## 2021-01-25 ENCOUNTER — Other Ambulatory Visit: Payer: Self-pay

## 2021-01-25 ENCOUNTER — Ambulatory Visit: Payer: Medicaid Other | Admitting: Family Medicine

## 2021-01-25 ENCOUNTER — Encounter: Payer: Self-pay | Admitting: Family Medicine

## 2021-01-25 VITALS — BP 156/103 | HR 91 | Temp 98.5°F | Resp 16 | Ht 64.0 in | Wt 235.2 lb

## 2021-01-25 DIAGNOSIS — R519 Headache, unspecified: Secondary | ICD-10-CM | POA: Diagnosis not present

## 2021-01-25 DIAGNOSIS — I10 Essential (primary) hypertension: Secondary | ICD-10-CM | POA: Diagnosis not present

## 2021-01-25 DIAGNOSIS — Z7689 Persons encountering health services in other specified circumstances: Secondary | ICD-10-CM

## 2021-01-25 DIAGNOSIS — Z6841 Body Mass Index (BMI) 40.0 and over, adult: Secondary | ICD-10-CM | POA: Diagnosis not present

## 2021-01-25 MED ORDER — METOPROLOL SUCCINATE ER 25 MG PO TB24: 25.0000 mg | ORAL_TABLET | Freq: Every day | ORAL | 0 refills | Status: DC

## 2021-01-25 MED ORDER — HYDROCHLOROTHIAZIDE 25 MG PO TABS: 25.0000 mg | ORAL_TABLET | Freq: Every day | ORAL | 1 refills | Status: DC

## 2021-01-25 NOTE — Progress Notes (Signed)
Patient has a hx of HTN. This cause the patient to have headaches if  she takes her medication or not.

## 2021-01-26 ENCOUNTER — Encounter: Payer: Self-pay | Admitting: Family Medicine

## 2021-01-26 NOTE — Progress Notes (Signed)
New Patient Office Visit  Subjective:  Patient ID: Sally Garner, female    DOB: 1985-05-05  Age: 35 y.o. MRN: [id number]  CC:  Chief Complaint  Patient presents with   Establish Care   Hypertension   Headache    HPI Sally Garner presents for to establish care. Patient reports history of hypertension. Patient also noted intermittent headaches.   Past Medical History:  Diagnosis Date   Hypertension    was put on medication end of 03/2015; states has caused headache and is not taking as prescribed   Migraines    Symptomatic cholelithiasis 04/2015    Past Surgical History:  Procedure Laterality Date   CESAREAN SECTION N/A 05/25/2013   Procedure: CESAREAN SECTION;  Surgeon: Annalee Genta, DO;  Location: [facility];  Service: Obstetrics;  Laterality: N/A;   CESAREAN SECTION N/A 07/23/2017   Procedure: CESAREAN SECTION;  Surgeon: Donnamae Jude, MD;  Location: [facility];  Service: Obstetrics;  Laterality: N/A;   CHOLECYSTECTOMY N/A 05/05/2015   Procedure: LAPAROSCOPIC CHOLECYSTECTOMY;  Surgeon: Coralie Keens, MD;  Location: [facility];  Service: General;  Laterality: N/A;    Family History  Problem Relation Age of Onset   Birth defects Brother        polydactyl   Hypertension Maternal Aunt    Hypertension Maternal Uncle    Cancer Paternal Aunt        breast cancer    Social History   Socioeconomic History   Marital status: Single    Spouse name: Not on file   Number of children: Not on file   Years of education: Not on file   Highest education level: Not on file  Occupational History   Not on file  Tobacco Use   Smoking status: Some Days    Packs/day: 0.50    Years: 3.00    Pack years: 1.50    Types: Cigarettes   Smokeless tobacco: Never  Vaping Use   Vaping Use: Never used  Substance and Sexual Activity   Alcohol use: Yes    Comment: occasional   Drug use: No   Sexual activity: Yes    Birth control/protection: None  Other Topics  Concern   Not on file  Social History Narrative   Not on file   Social Determinants of Health   Financial Resource Strain: Not on file  Food Insecurity: Not on file  Transportation Needs: Not on file  Physical Activity: Not on file  Stress: Not on file  Social Connections: Not on file  Intimate Partner Violence: Not on file    ROS Review of Systems  Neurological:  Positive for headaches. Negative for dizziness, facial asymmetry and weakness.  All other systems reviewed and are negative.  Objective:   Today's Vitals: BP (!) 156/103   Pulse 91   Temp 98.5 F (36.9 C) (Oral)   Resp 16   Ht 5\' 4"  (1.626 m)   Wt 235 lb 3.2 oz (106.7 kg)   BMI 40.37 kg/m   Physical Exam Vitals and nursing note reviewed.  Constitutional:      General: She is not in acute distress.    Appearance: She is obese.  HENT:     Head: Normocephalic and atraumatic.  Eyes:     Pupils: Pupils are equal, round, and reactive to light.  Cardiovascular:     Rate and Rhythm: Normal rate and regular rhythm.  Pulmonary:     Effort: Pulmonary effort is normal.  Breath sounds: Normal breath sounds.  Abdominal:     Palpations: Abdomen is soft.     Tenderness: There is no abdominal tenderness.  Musculoskeletal:     Right lower leg: No edema.     Left lower leg: No edema.  Neurological:     General: No focal deficit present.     Mental Status: She is alert and oriented to person, place, and time.    Assessment & Plan:   1. Essential hypertension Elevated readings. Will add metoprolol 25mg  to present management and monitor  2. Nonintractable episodic headache, unspecified headache type Most likely 2/2 above. As above  3. Class 3 severe obesity due to excess calories with serious comorbidity and body mass index (BMI) of 40.0 to 44.9 in adult [facility]) Discussed dietary and activity options. Goal is 3-5lbs/mo of weight loss. Will monitor  4. Encounter to establish care     Outpatient Encounter  Medications as of 01/25/2021  Medication Sig   hydrochlorothiazide (HYDRODIURIL) 25 MG tablet Take 1 tablet (25 mg total) by mouth daily.   metoprolol succinate (TOPROL XL) 25 MG 24 hr tablet Take 1 tablet (25 mg total) by mouth daily.   albuterol (VENTOLIN HFA) 108 (90 Base) MCG/ACT inhaler Inhale 1-2 puffs into the lungs every 6 (six) hours as needed for wheezing or shortness of breath.   amoxicillin-clavulanate (AUGMENTIN) 875-125 MG tablet Take 1 tablet by mouth every 12 (twelve) hours.   azithromycin (ZITHROMAX) 250 MG tablet Take 1 tablet (250 mg total) by mouth daily. Take first 2 tablets together, then 1 every day until finished.   benzonatate (TESSALON) 100 MG capsule Take 1 capsule (100 mg total) by mouth every 8 (eight) hours.   famotidine (PEPCID) 20 MG tablet Take 1 tablet (20 mg total) by mouth 2 (two) times daily.   ondansetron (ZOFRAN ODT) 8 MG disintegrating tablet Take 1 tablet (8 mg total) by mouth every 8 (eight) hours as needed for nausea or vomiting.   promethazine-dextromethorphan (PROMETHAZINE-DM) 6.25-15 MG/5ML syrup Take 5 mLs by mouth at bedtime as needed for cough.   triamcinolone cream (KENALOG) 0.1 % Apply 1 application topically 2 (two) times daily.   [DISCONTINUED] amLODipine (NORVASC) 10 MG tablet Take 1 tablet (10 mg total) by mouth daily. (Patient not taking: Reported on 10/17/2017)   [DISCONTINUED] ferrous sulfate 325 (65 FE) MG tablet Take 1 tablet (325 mg total) by mouth 3 (three) times daily with meals. (Patient not taking: Reported on 10/17/2017)   [DISCONTINUED] hydrochlorothiazide (HYDRODIURIL) 12.5 MG tablet Take 1 tablet (12.5 mg total) by mouth daily.   No facility-administered encounter medications on file as of 01/25/2021.    Follow-up: Return in about 2 weeks (around 02/08/2021) for physical.   Becky Sax, MD

## 2021-01-28 ENCOUNTER — Encounter: Payer: Medicaid Other | Admitting: Obstetrics & Gynecology

## 2021-02-08 ENCOUNTER — Encounter: Payer: Self-pay | Admitting: Family Medicine

## 2021-02-08 ENCOUNTER — Ambulatory Visit: Payer: Medicaid Other | Admitting: Family Medicine

## 2021-02-08 ENCOUNTER — Other Ambulatory Visit: Payer: Self-pay

## 2021-02-08 VITALS — BP 115/82 | HR 96 | Temp 98.1°F | Resp 16 | Wt 234.0 lb

## 2021-02-08 DIAGNOSIS — R519 Headache, unspecified: Secondary | ICD-10-CM

## 2021-02-08 DIAGNOSIS — R739 Hyperglycemia, unspecified: Secondary | ICD-10-CM

## 2021-02-08 DIAGNOSIS — I10 Essential (primary) hypertension: Secondary | ICD-10-CM | POA: Diagnosis not present

## 2021-02-08 LAB — POCT GLYCOSYLATED HEMOGLOBIN (HGB A1C): Hemoglobin A1C: 5.9 % — AB (ref 4.0–5.6)

## 2021-02-08 MED ORDER — METOPROLOL SUCCINATE ER 25 MG PO TB24: 25.0000 mg | ORAL_TABLET | Freq: Every day | ORAL | 0 refills | Status: DC

## 2021-02-08 MED ORDER — HYDROCHLOROTHIAZIDE 25 MG PO TABS: 25.0000 mg | ORAL_TABLET | Freq: Every day | ORAL | 0 refills | Status: DC

## 2021-02-09 ENCOUNTER — Encounter: Payer: Self-pay | Admitting: Family Medicine

## 2021-02-09 NOTE — Progress Notes (Signed)
Established Patient Office Visit  Subjective:  Patient ID: Sally Garner, female    DOB: 04/07/1985  Age: 35 y.o. MRN: [id number]  CC:  Chief Complaint  Patient presents with   Follow-up   Hypertension    HPI Sally Garner presents for follow up of hypertension. Patient denies acute complaints or concerns. She reports also that she has had less frequent headaches and less severe with present management.   Past Medical History:  Diagnosis Date   Hypertension    was put on medication end of 03/2015; states has caused headache and is not taking as prescribed   Migraines    Symptomatic cholelithiasis 04/2015    Past Surgical History:  Procedure Laterality Date   CESAREAN SECTION N/A 05/25/2013   Procedure: CESAREAN SECTION;  Surgeon: Annalee Genta, DO;  Location: [facility];  Service: Obstetrics;  Laterality: N/A;   CESAREAN SECTION N/A 07/23/2017   Procedure: CESAREAN SECTION;  Surgeon: Donnamae Jude, MD;  Location: [facility];  Service: Obstetrics;  Laterality: N/A;   CHOLECYSTECTOMY N/A 05/05/2015   Procedure: LAPAROSCOPIC CHOLECYSTECTOMY;  Surgeon: Coralie Keens, MD;  Location: [facility];  Service: General;  Laterality: N/A;    Family History  Problem Relation Age of Onset   Birth defects Brother        polydactyl   Hypertension Maternal Aunt    Hypertension Maternal Uncle    Cancer Paternal Aunt        breast cancer    Social History   Socioeconomic History   Marital status: Single    Spouse name: Not on file   Number of children: Not on file   Years of education: Not on file   Highest education level: Not on file  Occupational History   Not on file  Tobacco Use   Smoking status: Some Days    Packs/day: 0.50    Years: 3.00    Pack years: 1.50    Types: Cigarettes   Smokeless tobacco: Never  Vaping Use   Vaping Use: Never used  Substance and Sexual Activity   Alcohol use: Yes    Comment: occasional   Drug use: No   Sexual  activity: Yes    Birth control/protection: None  Other Topics Concern   Not on file  Social History Narrative   Not on file   Social Determinants of Health   Financial Resource Strain: Not on file  Food Insecurity: Not on file  Transportation Needs: Not on file  Physical Activity: Not on file  Stress: Not on file  Social Connections: Not on file  Intimate Partner Violence: Not on file    ROS Review of Systems  Neurological:  Positive for headaches. Negative for dizziness, facial asymmetry and weakness.  All other systems reviewed and are negative.  Objective:   Today's Vitals: BP 115/82    Pulse 96    Temp 98.1 F (36.7 C) (Oral)    Resp 16    Wt 234 lb (106.1 kg)    SpO2 96%    BMI 40.17 kg/m   Physical Exam Vitals and nursing note reviewed.  Constitutional:      General: She is not in acute distress.    Appearance: She is obese.  HENT:     Head: Normocephalic and atraumatic.  Eyes:     Pupils: Pupils are equal, round, and reactive to light.  Cardiovascular:     Rate and Rhythm: Normal rate and regular rhythm.  Pulmonary:  Effort: Pulmonary effort is normal.     Breath sounds: Normal breath sounds.  Abdominal:     Palpations: Abdomen is soft.     Tenderness: There is no abdominal tenderness.  Musculoskeletal:     Right lower leg: No edema.     Left lower leg: No edema.  Neurological:     General: No focal deficit present.     Mental Status: She is alert and oriented to person, place, and time.    Assessment & Plan:   1. Hyperglycemia A1c is wnl. Continue present management.   - POCT glycosylated hemoglobin (Hb A1C) - Hemoglobin A1c; Future  2. Essential hypertension Good Readings. Continue present management and monitor.   3. Nonintractable episodic headache, unspecified headache type Improved. Continue present management.    Outpatient Encounter Medications as of 02/08/2021  Medication Sig   albuterol (VENTOLIN HFA) 108 (90 Base) MCG/ACT  inhaler Inhale 1-2 puffs into the lungs every 6 (six) hours as needed for wheezing or shortness of breath.   triamcinolone cream (KENALOG) 0.1 % Apply 1 application topically 2 (two) times daily.   [DISCONTINUED] amoxicillin-clavulanate (AUGMENTIN) 875-125 MG tablet Take 1 tablet by mouth every 12 (twelve) hours.   [DISCONTINUED] azithromycin (ZITHROMAX) 250 MG tablet Take 1 tablet (250 mg total) by mouth daily. Take first 2 tablets together, then 1 every day until finished.   [DISCONTINUED] benzonatate (TESSALON) 100 MG capsule Take 1 capsule (100 mg total) by mouth every 8 (eight) hours.   [DISCONTINUED] famotidine (PEPCID) 20 MG tablet Take 1 tablet (20 mg total) by mouth 2 (two) times daily.   [DISCONTINUED] hydrochlorothiazide (HYDRODIURIL) 25 MG tablet Take 1 tablet (25 mg total) by mouth daily.   [DISCONTINUED] metoprolol succinate (TOPROL XL) 25 MG 24 hr tablet Take 1 tablet (25 mg total) by mouth daily.   [DISCONTINUED] ondansetron (ZOFRAN ODT) 8 MG disintegrating tablet Take 1 tablet (8 mg total) by mouth every 8 (eight) hours as needed for nausea or vomiting.   [DISCONTINUED] promethazine-dextromethorphan (PROMETHAZINE-DM) 6.25-15 MG/5ML syrup Take 5 mLs by mouth at bedtime as needed for cough.   hydrochlorothiazide (HYDRODIURIL) 25 MG tablet Take 1 tablet (25 mg total) by mouth daily.   metoprolol succinate (TOPROL XL) 25 MG 24 hr tablet Take 1 tablet (25 mg total) by mouth daily.   [DISCONTINUED] amLODipine (NORVASC) 10 MG tablet Take 1 tablet (10 mg total) by mouth daily. (Patient not taking: Reported on 10/17/2017)   [DISCONTINUED] ferrous sulfate 325 (65 FE) MG tablet Take 1 tablet (325 mg total) by mouth 3 (three) times daily with meals. (Patient not taking: Reported on 10/17/2017)   No facility-administered encounter medications on file as of 02/08/2021.    Follow-up: No follow-ups on file.   Becky Sax, MD

## 2021-03-08 ENCOUNTER — Encounter: Payer: Medicaid Other | Admitting: Family Medicine

## 2021-04-05 ENCOUNTER — Ambulatory Visit (INDEPENDENT_AMBULATORY_CARE_PROVIDER_SITE_OTHER): Payer: Medicaid Other | Admitting: Family Medicine

## 2021-04-05 ENCOUNTER — Other Ambulatory Visit: Payer: Self-pay

## 2021-04-05 ENCOUNTER — Encounter: Payer: Self-pay | Admitting: Family Medicine

## 2021-04-05 VITALS — BP 125/81 | HR 88 | Temp 98.6°F | Resp 16 | Wt 237.2 lb

## 2021-04-05 DIAGNOSIS — Z0000 Encounter for general adult medical examination without abnormal findings: Secondary | ICD-10-CM | POA: Diagnosis not present

## 2021-04-05 DIAGNOSIS — Z124 Encounter for screening for malignant neoplasm of cervix: Secondary | ICD-10-CM

## 2021-04-05 NOTE — Progress Notes (Signed)
Patient is here for CPE. Patient would like to talk about her menstrual  cycles with provider with how her bleeding is heavy now since children

## 2021-04-05 NOTE — Progress Notes (Signed)
Established Patient Office Visit  Subjective:  Patient ID: Sally Garner, female    DOB: 12/17/1985  Age: 36 y.o. MRN: [id number]  CC:  Chief Complaint  Patient presents with   Annual Exam    HPI Royalti Schauf presents for routine annual exam.   Past Medical History:  Diagnosis Date   Hypertension    was put on medication end of 03/2015; states has caused headache and is not taking as prescribed   Migraines    Symptomatic cholelithiasis 04/2015    Past Surgical History:  Procedure Laterality Date   CESAREAN SECTION N/A 05/25/2013   Procedure: CESAREAN SECTION;  Surgeon: Annalee Genta, DO;  Location: [facility];  Service: Obstetrics;  Laterality: N/A;   CESAREAN SECTION N/A 07/23/2017   Procedure: CESAREAN SECTION;  Surgeon: Donnamae Jude, MD;  Location: [facility];  Service: Obstetrics;  Laterality: N/A;   CHOLECYSTECTOMY N/A 05/05/2015   Procedure: LAPAROSCOPIC CHOLECYSTECTOMY;  Surgeon: Coralie Keens, MD;  Location: [facility];  Service: General;  Laterality: N/A;    Family History  Problem Relation Age of Onset   Birth defects Brother        polydactyl   Hypertension Maternal Aunt    Hypertension Maternal Uncle    Cancer Paternal Aunt        breast cancer    Social History   Socioeconomic History   Marital status: Single    Spouse name: Not on file   Number of children: Not on file   Years of education: Not on file   Highest education level: Not on file  Occupational History   Not on file  Tobacco Use   Smoking status: Some Days    Packs/day: 0.50    Years: 3.00    Pack years: 1.50    Types: Cigarettes   Smokeless tobacco: Never  Vaping Use   Vaping Use: Never used  Substance and Sexual Activity   Alcohol use: Yes    Comment: occasional   Drug use: No   Sexual activity: Yes    Birth control/protection: None  Other Topics Concern   Not on file  Social History Narrative   Not on file   Social Determinants of Health    Financial Resource Strain: Not on file  Food Insecurity: Not on file  Transportation Needs: Not on file  Physical Activity: Not on file  Stress: Not on file  Social Connections: Not on file  Intimate Partner Violence: Not on file    ROS Review of Systems  Genitourinary:  Positive for menstrual problem. Negative for pelvic pain.  All other systems reviewed and are negative.  Objective:   Today's Vitals: BP 125/81    Pulse 88    Temp 98.6 F (37 C) (Oral)    Resp 16    Wt 237 lb 3.2 oz (107.6 kg)    SpO2 97%    BMI 40.72 kg/m   Physical Exam Vitals and nursing note reviewed.  Constitutional:      General: She is not in acute distress.    Appearance: She is obese.  HENT:     Head: Normocephalic and atraumatic.     Right Ear: Tympanic membrane, ear canal and external ear normal.     Left Ear: Tympanic membrane, ear canal and external ear normal.     Nose: Nose normal.     Mouth/Throat:     Mouth: Mucous membranes are moist.     Pharynx: Oropharynx is clear.  Eyes:     Conjunctiva/sclera: Conjunctivae normal.     Pupils: Pupils are equal, round, and reactive to light.  Neck:     Thyroid: No thyromegaly.  Cardiovascular:     Rate and Rhythm: Normal rate and regular rhythm.     Heart sounds: Normal heart sounds. No murmur heard. Pulmonary:     Effort: Pulmonary effort is normal. No respiratory distress.     Breath sounds: Normal breath sounds.  Abdominal:     General: There is no distension.     Palpations: Abdomen is soft. There is no mass.     Tenderness: There is no abdominal tenderness.  Genitourinary:    Comments: Deferred Musculoskeletal:        General: Normal range of motion.     Cervical back: Normal range of motion and neck supple.  Skin:    General: Skin is warm and dry.  Neurological:     General: No focal deficit present.     Mental Status: She is alert and oriented to person, place, and time.  Psychiatric:        Mood and Affect: Mood normal.         Behavior: Behavior normal.    Assessment & Plan:   1. Annual physical exam Routine labs ordered - CMP14+EGFR - CBC with Differential - TSH - Lipid Panel  2. Pap smear for cervical cancer screening Referral to gyn  - Ambulatory referral to Gynecology    Outpatient Encounter Medications as of 04/05/2021  Medication Sig   albuterol (VENTOLIN HFA) 108 (90 Base) MCG/ACT inhaler Inhale 1-2 puffs into the lungs every 6 (six) hours as needed for wheezing or shortness of breath.   hydrochlorothiazide (HYDRODIURIL) 25 MG tablet Take 1 tablet (25 mg total) by mouth daily.   metoprolol succinate (TOPROL XL) 25 MG 24 hr tablet Take 1 tablet (25 mg total) by mouth daily.   triamcinolone cream (KENALOG) 0.1 % Apply 1 application topically 2 (two) times daily.   [DISCONTINUED] amLODipine (NORVASC) 10 MG tablet Take 1 tablet (10 mg total) by mouth daily. (Patient not taking: Reported on 10/17/2017)   [DISCONTINUED] ferrous sulfate 325 (65 FE) MG tablet Take 1 tablet (325 mg total) by mouth 3 (three) times daily with meals. (Patient not taking: Reported on 10/17/2017)   No facility-administered encounter medications on file as of 04/05/2021.    Follow-up: No follow-ups on file.   Becky Sax, MD

## 2021-04-06 LAB — CBC WITH DIFFERENTIAL/PLATELET
Basophils Absolute: 0 10*3/uL (ref 0.0–0.2)
Basos: 0 %
EOS (ABSOLUTE): 0.2 10*3/uL (ref 0.0–0.4)
Eos: 2 %
Hematocrit: 35.3 % (ref 34.0–46.6)
Hemoglobin: 11.1 g/dL (ref 11.1–15.9)
Immature Grans (Abs): 0 10*3/uL (ref 0.0–0.1)
Immature Granulocytes: 0 %
Lymphocytes Absolute: 3.4 10*3/uL — ABNORMAL HIGH (ref 0.7–3.1)
Lymphs: 53 %
MCH: 25.3 pg — ABNORMAL LOW (ref 26.6–33.0)
MCHC: 31.4 g/dL — ABNORMAL LOW (ref 31.5–35.7)
MCV: 80 fL (ref 79–97)
Monocytes Absolute: 0.5 10*3/uL (ref 0.1–0.9)
Monocytes: 8 %
Neutrophils Absolute: 2.4 10*3/uL (ref 1.4–7.0)
Neutrophils: 37 %
Platelets: 319 10*3/uL (ref 150–450)
RBC: 4.39 x10E6/uL (ref 3.77–5.28)
RDW: 14.4 % (ref 11.7–15.4)
WBC: 6.5 10*3/uL (ref 3.4–10.8)

## 2021-04-06 LAB — CMP14+EGFR
ALT: 11 IU/L (ref 0–32)
AST: 16 IU/L (ref 0–40)
Albumin/Globulin Ratio: 1.4 (ref 1.2–2.2)
Albumin: 4.2 g/dL (ref 3.8–4.8)
Alkaline Phosphatase: 61 IU/L (ref 44–121)
BUN/Creatinine Ratio: 10 (ref 9–23)
BUN: 8 mg/dL (ref 6–20)
Bilirubin Total: 0.2 mg/dL (ref 0.0–1.2)
CO2: 24 mmol/L (ref 20–29)
Calcium: 9.2 mg/dL (ref 8.7–10.2)
Chloride: 101 mmol/L (ref 96–106)
Creatinine, Ser: 0.8 mg/dL (ref 0.57–1.00)
Globulin, Total: 3 g/dL (ref 1.5–4.5)
Glucose: 92 mg/dL (ref 70–99)
Potassium: 3.8 mmol/L (ref 3.5–5.2)
Sodium: 142 mmol/L (ref 134–144)
Total Protein: 7.2 g/dL (ref 6.0–8.5)
eGFR: 98 mL/min/{1.73_m2} (ref >59)

## 2021-04-06 LAB — TSH: TSH: 0.818 u[IU]/mL (ref 0.450–4.500)

## 2021-04-06 LAB — LIPID PANEL
Chol/HDL Ratio: 3.9 ratio (ref 0.0–4.4)
Cholesterol, Total: 145 mg/dL (ref 100–199)
HDL: 37 mg/dL — ABNORMAL LOW (ref >39)
LDL Chol Calc (NIH): 85 mg/dL (ref 0–99)
Triglycerides: 126 mg/dL (ref 0–149)
VLDL Cholesterol Cal: 23 mg/dL (ref 5–40)

## 2021-05-17 ENCOUNTER — Encounter (HOSPITAL_COMMUNITY): Payer: Self-pay

## 2021-05-17 ENCOUNTER — Other Ambulatory Visit: Payer: Self-pay

## 2021-05-17 ENCOUNTER — Emergency Department (HOSPITAL_COMMUNITY)
Admission: EM | Admit: 2021-05-17 | Discharge: 2021-05-18 | Disposition: A | Discharge status: Home / Self Care | Payer: Medicaid Other | Attending: Emergency Medicine | Admitting: Emergency Medicine

## 2021-05-17 DIAGNOSIS — Z5321 Procedure and treatment not carried out due to patient leaving prior to being seen by health care provider: Secondary | ICD-10-CM | POA: Diagnosis not present

## 2021-05-17 DIAGNOSIS — G43909 Migraine, unspecified, not intractable, without status migrainosus: Secondary | ICD-10-CM | POA: Insufficient documentation

## 2021-05-17 DIAGNOSIS — G4489 Other headache syndrome: Secondary | ICD-10-CM | POA: Diagnosis not present

## 2021-05-17 DIAGNOSIS — I10 Essential (primary) hypertension: Secondary | ICD-10-CM | POA: Insufficient documentation

## 2021-05-17 DIAGNOSIS — R42 Dizziness and giddiness: Secondary | ICD-10-CM | POA: Diagnosis not present

## 2021-05-17 NOTE — ED Triage Notes (Signed)
Patient BIB EMS from home with c/o migraine x 30 mins. Denies visual disturbance. 4 mg zofran given IM, nausea resolved. Pt took 600 mg ibuprofen, Hx HTN. ? ? ?

## 2021-05-18 ENCOUNTER — Other Ambulatory Visit (HOSPITAL_COMMUNITY)
Admission: RE | Admit: 2021-05-18 | Discharge: 2021-05-18 | Disposition: A | Discharge status: Home / Self Care | Payer: Medicaid Other | Source: Ambulatory Visit | Attending: Obstetrics | Admitting: Obstetrics

## 2021-05-18 ENCOUNTER — Encounter: Payer: Self-pay | Admitting: Obstetrics

## 2021-05-18 ENCOUNTER — Ambulatory Visit (INDEPENDENT_AMBULATORY_CARE_PROVIDER_SITE_OTHER): Payer: Medicaid Other | Admitting: Obstetrics

## 2021-05-18 VITALS — BP 167/107 | HR 98 | Ht 64.0 in | Wt 246.0 lb

## 2021-05-18 DIAGNOSIS — N944 Primary dysmenorrhea: Secondary | ICD-10-CM

## 2021-05-18 DIAGNOSIS — N898 Other specified noninflammatory disorders of vagina: Secondary | ICD-10-CM

## 2021-05-18 DIAGNOSIS — Z01419 Encounter for gynecological examination (general) (routine) without abnormal findings: Secondary | ICD-10-CM | POA: Insufficient documentation

## 2021-05-18 DIAGNOSIS — Z131 Encounter for screening for diabetes mellitus: Secondary | ICD-10-CM

## 2021-05-18 DIAGNOSIS — D251 Intramural leiomyoma of uterus: Secondary | ICD-10-CM | POA: Diagnosis not present

## 2021-05-18 DIAGNOSIS — Z113 Encounter for screening for infections with a predominantly sexual mode of transmission: Secondary | ICD-10-CM | POA: Diagnosis not present

## 2021-05-18 DIAGNOSIS — E66813 Obesity, class 3: Secondary | ICD-10-CM

## 2021-05-18 DIAGNOSIS — Z6841 Body Mass Index (BMI) 40.0 and over, adult: Secondary | ICD-10-CM

## 2021-05-18 DIAGNOSIS — N939 Abnormal uterine and vaginal bleeding, unspecified: Secondary | ICD-10-CM

## 2021-05-18 DIAGNOSIS — I10 Essential (primary) hypertension: Secondary | ICD-10-CM

## 2021-05-18 MED ORDER — IBUPROFEN 800 MG PO TABS: 800.0000 mg | ORAL_TABLET | Freq: Three times a day (TID) | ORAL | 5 refills | Status: DC | PRN

## 2021-05-18 NOTE — Progress Notes (Signed)
Pt presents for annual exam.  ?Last Pap 12/29/16 ?Painful, heavy periods ?Desires blood work, STD screen ?BP elevated today- pt counseled to f/u PCP and become compliant with medications. ?

## 2021-05-18 NOTE — ED Provider Notes (Signed)
I went to evaluate the patient and was told that she had left prior to being seen.  ?  ?Merrily Pew, MD ?05/18/21 0121 ? ?

## 2021-05-18 NOTE — ED Notes (Signed)
Patient not located in room.  Attempted to locate patient without success.  Patient left department prior to discharge.  Didn't identify that she was leaving to staff members. MD aware ?

## 2021-05-18 NOTE — Progress Notes (Signed)
? ?Subjective: ? ? ?  ?  ? Sally Garner is a 36 y.o. female here for a routine exam.  Current complaints: Vaginal discharge.   ? ?Personal health questionnaire:  ?Is patient Ashkenazi Jewish, have a family history of breast and/or ovarian cancer: no ?Is there a family history of uterine cancer diagnosed at age < 53, gastrointestinal cancer, urinary tract cancer, family member who is a Field seismologist syndrome-associated carrier: no ?Is the patient overweight and hypertensive, family history of diabetes, personal history of gestational diabetes, preeclampsia or PCOS: no ?Is patient over 65, have PCOS,  family history of premature CHD under age 24, diabetes, smoke, have hypertension or peripheral artery disease:  no ?At any time, has a partner hit, kicked or otherwise hurt or frightened you?: no ?Over the past 2 weeks, have you felt down, depressed or hopeless?: no ?Over the past 2 weeks, have you felt little interest or pleasure in doing things?:no ? ? ?Gynecologic History ?Patient's last menstrual period was 04/30/2021 (approximate). ?Contraception: tubal ligation ?Last Pap: 2018. Results were: normal ?Last mammogram: n/a. Results were: n/a ? ?Obstetric History ?OB History  ?Gravida Para Term Preterm AB Living  ?'2 2 2     2  '$ ?SAB IAB Ectopic Multiple Live Births  ?      0 2  ?  ?# Outcome Date GA Lbr Len/2nd Weight Sex Delivery Anes PTL Lv  ?2 Term 07/23/17 [redacted]w[redacted]d 7 lb 8.3 oz (3.41 kg) M CS-Vac EPI  LIV  ?1 Term 05/25/13 [id number] lb 10 oz (3.912 kg) F CS-LTranv EPI  LIV  ?   Complications: Failure to Progress in First Stage  ? ? ?Past Medical History:  ?Diagnosis Date  ? Hypertension   ? was put on medication end of 03/2015; states has caused headache and is not taking as prescribed  ? Migraines   ? Symptomatic cholelithiasis 04/2015  ?  ?Past Surgical History:  ?Procedure Laterality Date  ? CESAREAN SECTION N/A 05/25/2013  ? Procedure: CESAREAN SECTION;  Surgeon: JeAnnalee GentaDO;  [facility];  Service: Obstetrics;   Laterality: N/A;  ? CESAREAN SECTION N/A 07/23/2017  ? Procedure: CESAREAN SECTION;  Surgeon: PrDonnamae JudeMD;  [facility] Service: Obstetrics;  Laterality: N/A;  ? CHOLECYSTECTOMY N/A 05/05/2015  ? Procedure: LAPAROSCOPIC CHOLECYSTECTOMY;  Surgeon: DoCoralie KeensMD;  [facility] Service: General;  Laterality: N/A;  ?  ? ?Current Outpatient Medications:  ?  hydrochlorothiazide (HYDRODIURIL) 25 MG tablet, Take 1 tablet (25 mg total) by mouth daily., Disp: 90 tablet, Rfl: 0 ?  ibuprofen (ADVIL) 800 MG tablet, Take 1 tablet (800 mg total) by mouth every 8 (eight) hours as needed., Disp: 30 tablet, Rfl: 5 ?  triamcinolone cream (KENALOG) 0.1 %, Apply 1 application topically 2 (two) times daily., Disp: 453.6 g, Rfl: 1 ?  albuterol (VENTOLIN HFA) 108 (90 Base) MCG/ACT inhaler, Inhale 1-2 puffs into the lungs every 6 (six) hours as needed for wheezing or shortness of breath. (Patient not taking: Reported on 05/18/2021), Disp: 1 each, Rfl: 0 ?  metoprolol succinate (TOPROL XL) 25 MG 24 hr tablet, Take 1 tablet (25 mg total) by mouth daily. (Patient not taking: Reported on 05/18/2021), Disp: 90 tablet, Rfl: 0 ?No Known Allergies  ?Social History  ? ?Tobacco Use  ? Smoking status: Some Days  ?  Packs/day: 0.50  ?  Years: 3.00  ?  Pack years: 1.50  ?  Types: Cigarettes  ?  Smokeless tobacco: Never  ?Substance Use Topics  ? Alcohol use: Not Currently  ?  Comment: occasional  ?  ?Family History  ?Problem Relation Age of Onset  ? Birth defects Brother   ?     polydactyl  ? Hypertension Maternal Aunt   ? Hypertension Maternal Uncle   ? Cancer Paternal Aunt   ?     breast cancer  ?  ? ? ?Review of Systems ? ?Constitutional: negative for fatigue and weight loss ?Respiratory: negative for cough and wheezing ?Cardiovascular: negative for chest pain, fatigue and palpitations ?Gastrointestinal: negative for abdominal pain and change in bowel habits ?Musculoskeletal:negative for  myalgias ?Neurological: negative for gait problems and tremors ?Behavioral/Psych: negative for abusive relationship, depression ?Endocrine: negative for temperature intolerance    ?Genitourinary:negative for abnormal menstrual periods, genital lesions, hot flashes, sexual problems and vaginal discharge ?Integument/breast: negative for breast lump, breast tenderness, nipple discharge and skin lesion(s) ? ?  ?Objective:  ? ?    ?BP (!) 167/107   Pulse 98   Ht '5\' 4"'$  (1.626 m)   Wt 246 lb (111.6 kg)   LMP 04/30/2021 (Approximate)   BMI 42.23 kg/m?  ?General:   alert  ?Skin:   no rash or abnormalities  ?Lungs:   clear to auscultation bilaterally  ?Heart:   regular rate and rhythm, S1, S2 normal, no murmur, click, rub or gallop  ?Breasts:   normal without suspicious masses, skin or nipple changes or axillary nodes  ?Abdomen:  normal findings: no organomegaly, soft, non-tender and no hernia  ?Pelvis:  External genitalia: normal general appearance ?Urinary system: urethral meatus normal and bladder without fullness, nontender ?Vaginal: normal without tenderness, induration or masses ?Cervix: normal appearance ?Adnexa: normal bimanual exam ?Uterus: anteverted and non-tender, normal size  ? ?Lab Review ?Urine pregnancy test ?Labs reviewed yes ?Radiologic studies reviewed no ? ?I have spent a total of 20 minutes of face-to-face time, excluding clinical staff time, reviewing notes and preparing to see patient, ordering tests and/or medications, and counseling the patient.  ? ?Assessment:  ? ? 1. Encounter for routine gynecological examination with Papanicolaou smear of cervix ?Rx: ?- Cytology - PAP( [facility]) ? ?2. Fibroids, intramural ? ?3. Primary dysmenorrhea ?Rx: ?- ibuprofen (ADVIL) 800 MG tablet; Take 1 tablet (800 mg total) by mouth every 8 (eight) hours as needed.  Dispense: 30 tablet; Refill: 5 ? ?4. Abnormal uterine bleeding (AUB) ?Rx: ?- US PELVIC COMPLETE WITH TRANSVAGINAL; Future ?- CBC ?- Ferritin ?-  Comprehensive metabolic panel ?- TSH ? ?5. Vaginal discharge ?Rx: ?- Cervicovaginal ancillary only( [facility]) ? ?6. Screening for STD (sexually transmitted disease) ?Rx: ?- HIV Antibody (routine testing w rflx) ?- RPR ?- Hepatitis B Surface AntiGEN ?- Hepatitis C Antibody ? ?7. HTN (hypertension), benign ?- managed by PCP ? ?8. Tobacco dependence ?- cessation recommended ? ?8. Class 3 severe obesity without serious comorbidity with body mass index (BMI) of 40.0 to 44.9 in adult, unspecified obesity type ([place]) ?- weight reduction recommended ? ?9. Diabetes mellitus screening ?Rx: ?- Hemoglobin A1c  ? ? ?Plan:  ? ? Education reviewed: calcium supplements, depression evaluation, low fat, low cholesterol diet, safe sex/STD prevention, self breast exams, smoking cessation, and weight bearing exercise. ?Follow up in: 1 year.  ? ?Meds ordered this encounter  ?Medications  ? ibuprofen (ADVIL) 800 MG tablet  ?  Sig: Take 1 tablet (800 mg total) by mouth every 8 (eight) hours as needed.  ?  Dispense:  30 tablet  ?  Refill:  5  ? ?Orders Placed This Encounter  ?Procedures  ? US PELVIC COMPLETE WITH TRANSVAGINAL  ?  Standing Status:   Future  ?  Standing Expiration Date:   05/19/2022  ?  Order Specific Question:   Reason for Exam (SYMPTOM  OR DIAGNOSIS REQUIRED)  ?  Answer:   AUB.  Uterine fibroids  ?  Order Specific Question:   Preferred imaging location?  ?  Answer:   [facility] Ul[facility]  ? HIV Antibody (routine testing w rflx)  ? RPR  ? Hepatitis B Surface AntiGEN  ? Hepatitis C Antibody  ? CBC  ? Ferritin  ? Comprehensive metabolic panel  ? TSH  ? Hemoglobin A1c  ? ? ? Shelly Bombard, MD ?05/18/2021 10:08 AM  ?

## 2021-05-18 NOTE — ED Notes (Signed)
Unable to reassess vital signs prior to patient's departure.  Patient had left department ?

## 2021-05-19 LAB — COMPREHENSIVE METABOLIC PANEL
ALT: 8 IU/L (ref 0–32)
AST: 11 IU/L (ref 0–40)
Albumin/Globulin Ratio: 1.3 (ref 1.2–2.2)
Albumin: 4.1 g/dL (ref 3.8–4.8)
Alkaline Phosphatase: 70 IU/L (ref 44–121)
BUN/Creatinine Ratio: 11 (ref 9–23)
BUN: 8 mg/dL (ref 6–20)
Bilirubin Total: <0.2 mg/dL (ref 0.0–1.2)
CO2: 23 mmol/L (ref 20–29)
Calcium: 9.7 mg/dL (ref 8.7–10.2)
Chloride: 102 mmol/L (ref 96–106)
Creatinine, Ser: 0.73 mg/dL (ref 0.57–1.00)
Globulin, Total: 3.2 g/dL (ref 1.5–4.5)
Glucose: 90 mg/dL (ref 70–99)
Potassium: 4.5 mmol/L (ref 3.5–5.2)
Sodium: 142 mmol/L (ref 134–144)
Total Protein: 7.3 g/dL (ref 6.0–8.5)
eGFR: 110 mL/min/{1.73_m2} (ref >59)

## 2021-05-19 LAB — CERVICOVAGINAL ANCILLARY ONLY
Bacterial Vaginitis (gardnerella): POSITIVE — AB
Candida Glabrata: NEGATIVE
Candida Vaginitis: POSITIVE — AB
Chlamydia: NEGATIVE
Comment: NEGATIVE
Comment: NEGATIVE
Comment: NEGATIVE
Comment: NEGATIVE
Comment: NEGATIVE
Comment: NORMAL
Neisseria Gonorrhea: NEGATIVE
Trichomonas: NEGATIVE

## 2021-05-19 LAB — HEMOGLOBIN A1C
Est. average glucose Bld gHb Est-mCnc: 128 mg/dL
Hgb A1c MFr Bld: 6.1 % — ABNORMAL HIGH (ref 4.8–5.6)

## 2021-05-19 LAB — CBC
Hematocrit: 32.8 % — ABNORMAL LOW (ref 34.0–46.6)
Hemoglobin: 10.7 g/dL — ABNORMAL LOW (ref 11.1–15.9)
MCH: 26 pg — ABNORMAL LOW (ref 26.6–33.0)
MCHC: 32.6 g/dL (ref 31.5–35.7)
MCV: 80 fL (ref 79–97)
Platelets: 372 10*3/uL (ref 150–450)
RBC: 4.11 x10E6/uL (ref 3.77–5.28)
RDW: 13.6 % (ref 11.7–15.4)
WBC: 8.7 10*3/uL (ref 3.4–10.8)

## 2021-05-19 LAB — FERRITIN: Ferritin: 10 ng/mL — ABNORMAL LOW (ref 15–150)

## 2021-05-19 LAB — HIV ANTIBODY (ROUTINE TESTING W REFLEX): HIV Screen 4th Generation wRfx: NONREACTIVE

## 2021-05-19 LAB — HEPATITIS B SURFACE ANTIGEN: Hepatitis B Surface Ag: NEGATIVE

## 2021-05-19 LAB — HEPATITIS C ANTIBODY: Hep C Virus Ab: NONREACTIVE

## 2021-05-19 LAB — RPR: RPR Ser Ql: NONREACTIVE

## 2021-05-19 LAB — TSH: TSH: 0.839 u[IU]/mL (ref 0.450–4.500)

## 2021-05-20 ENCOUNTER — Ambulatory Visit: Admission: RE | Admit: 2021-05-20 | Payer: Medicaid Other | Source: Ambulatory Visit

## 2021-05-21 ENCOUNTER — Other Ambulatory Visit: Payer: Self-pay | Admitting: Obstetrics

## 2021-05-21 DIAGNOSIS — N760 Acute vaginitis: Secondary | ICD-10-CM

## 2021-05-21 DIAGNOSIS — B379 Candidiasis, unspecified: Secondary | ICD-10-CM

## 2021-05-21 LAB — CYTOLOGY - PAP
Comment: NEGATIVE
Diagnosis: NEGATIVE
High risk HPV: NEGATIVE

## 2021-05-21 MED ORDER — METRONIDAZOLE 500 MG PO TABS
500.0000 mg | ORAL_TABLET | Freq: Two times a day (BID) | ORAL | 2 refills | Status: DC
Start: 2021-05-21 — End: 2021-06-28

## 2021-05-21 MED ORDER — FLUCONAZOLE 150 MG PO TABS: 150.0000 mg | ORAL_TABLET | Freq: Once | ORAL | 0 refills | Status: AC

## 2021-05-31 ENCOUNTER — Ambulatory Visit
Admission: RE | Admit: 2021-05-31 | Discharge: 2021-05-31 | Disposition: A | Discharge status: Home / Self Care | Payer: Medicaid Other | Source: Ambulatory Visit | Attending: Obstetrics | Admitting: Obstetrics

## 2021-05-31 DIAGNOSIS — D250 Submucous leiomyoma of uterus: Secondary | ICD-10-CM | POA: Diagnosis not present

## 2021-05-31 DIAGNOSIS — N939 Abnormal uterine and vaginal bleeding, unspecified: Secondary | ICD-10-CM | POA: Insufficient documentation

## 2021-05-31 DIAGNOSIS — D252 Subserosal leiomyoma of uterus: Secondary | ICD-10-CM | POA: Diagnosis not present

## 2021-06-01 ENCOUNTER — Encounter (HOSPITAL_COMMUNITY): Payer: Self-pay | Admitting: Emergency Medicine

## 2021-06-01 ENCOUNTER — Telehealth (INDEPENDENT_AMBULATORY_CARE_PROVIDER_SITE_OTHER): Payer: Medicaid Other | Admitting: Obstetrics

## 2021-06-01 ENCOUNTER — Ambulatory Visit (HOSPITAL_COMMUNITY)
Admission: EM | Admit: 2021-06-01 | Discharge: 2021-06-01 | Disposition: A | Discharge status: Home / Self Care | Payer: Medicaid Other | Attending: Nurse Practitioner | Admitting: Nurse Practitioner

## 2021-06-01 ENCOUNTER — Encounter: Payer: Self-pay | Admitting: Obstetrics

## 2021-06-01 DIAGNOSIS — N945 Secondary dysmenorrhea: Secondary | ICD-10-CM

## 2021-06-01 DIAGNOSIS — J069 Acute upper respiratory infection, unspecified: Secondary | ICD-10-CM | POA: Insufficient documentation

## 2021-06-01 DIAGNOSIS — D250 Submucous leiomyoma of uterus: Secondary | ICD-10-CM | POA: Diagnosis not present

## 2021-06-01 DIAGNOSIS — H6592 Unspecified nonsuppurative otitis media, left ear: Secondary | ICD-10-CM | POA: Diagnosis not present

## 2021-06-01 DIAGNOSIS — D500 Iron deficiency anemia secondary to blood loss (chronic): Secondary | ICD-10-CM | POA: Diagnosis not present

## 2021-06-01 DIAGNOSIS — N939 Abnormal uterine and vaginal bleeding, unspecified: Secondary | ICD-10-CM | POA: Diagnosis not present

## 2021-06-01 DIAGNOSIS — Z6841 Body Mass Index (BMI) 40.0 and over, adult: Secondary | ICD-10-CM | POA: Diagnosis not present

## 2021-06-01 LAB — POCT RAPID STREP A, ED / UC: Streptococcus, Group A Screen (Direct): NEGATIVE

## 2021-06-01 LAB — POC INFLUENZA A AND B ANTIGEN (URGENT CARE ONLY)
INFLUENZA A ANTIGEN, POC: NEGATIVE
INFLUENZA B ANTIGEN, POC: NEGATIVE

## 2021-06-01 MED ORDER — AMOXICILLIN-POT CLAVULANATE 875-125 MG PO TABS: 1.0000 | ORAL_TABLET | Freq: Two times a day (BID) | ORAL | 0 refills | Status: DC

## 2021-06-01 MED ORDER — VITAFOL FE+ 90-0.6-0.4-200 MG PO CAPS: 1.0000 | ORAL_CAPSULE | Freq: Every day | ORAL | 11 refills | Status: DC

## 2021-06-01 MED ORDER — ONDANSETRON HCL 4 MG PO TABS: 4.0000 mg | ORAL_TABLET | Freq: Three times a day (TID) | ORAL | 0 refills | Status: DC | PRN

## 2021-06-01 MED ORDER — FLUTICASONE PROPIONATE 50 MCG/ACT NA SUSP: 2.0000 | Freq: Every day | NASAL | 0 refills | Status: DC

## 2021-06-01 NOTE — Progress Notes (Signed)
? ?TELEHEALTH GYNECOLOGY VISIT ENCOUNTER NOTE ? ?Provider location: [facility] [place]  ? ?Patient location: Home ? ?I connected with Carmin Muskrat on 06/01/21 at  4:10 PM EDT by telephone and verified that I am speaking with the correct person using two identifiers. Patient was unable to do MyChart audiovisual encounter due to technical difficulties, she tried several times.  ?  ?I discussed the limitations, risks, security and privacy concerns of performing an evaluation and management service by telephone and the availability of in person appointments. I also discussed with the patient that there may be a patient responsible charge related to this service. The patient expressed understanding and agreed to proceed. ?  ?History:  ?Sally Garner is a 36 y.o. [id number] female being evaluated today for ultrasound results.  She has a history of heavy and painful periods and uterine fibroids.  She denies any abnormal vaginal discharge, pelvic pain or other concerns.  She has finished fertility, has had a tubal ligation, and is considering definitive surgical management. ?  ?  ?Past Medical History:  ?Diagnosis Date  ? Hypertension   ? was put on medication end of 03/2015; states has caused headache and is not taking as prescribed  ? Migraines   ? Symptomatic cholelithiasis 04/2015  ? ?Past Surgical History:  ?Procedure Laterality Date  ? CESAREAN SECTION N/A 05/25/2013  ? Procedure: CESAREAN SECTION;  Surgeon: Annalee Genta, DO;  Location: [facility];  Service: Obstetrics;  Laterality: N/A;  ? CESAREAN SECTION N/A 07/23/2017  ? Procedure: CESAREAN SECTION;  Surgeon: Donnamae Jude, MD;  Location: [facility];  Service: Obstetrics;  Laterality: N/A;  ? CHOLECYSTECTOMY N/A 05/05/2015  ? Procedure: LAPAROSCOPIC CHOLECYSTECTOMY;  Surgeon: Coralie Keens, MD;  Location: [facility];  Service: General;  Laterality: N/A;  ? ?The following portions of the patient's history were reviewed and  updated as appropriate: allergies, current medications, past family history, past medical history, past social history, past surgical history and problem list.  ? ?Health Maintenance:  Normal pap and negative HRHPV on 05-18-2021.   ? ?Review of Systems:  ?Pertinent items noted in HPI and remainder of comprehensive ROS otherwise negative. ? ?Physical Exam:  ? ?General:  Alert, oriented and cooperative.   ?Mental Status: Normal mood and affect perceived. Normal judgment and thought content.  ?Physical exam deferred due to nature of the encounter ? ?Labs and Imaging ?No results found for this or any previous visit (from the past 336 hour(s)). ?US PELVIC COMPLETE WITH TRANSVAGINAL ? ?Result Date: 05/31/2021 ?CLINICAL DATA:  Abnormal uterine bleeding since 2019, history of Caesarean section x2, uterine fibroids. LMP 04/30/2021 EXAM: TRANSABDOMINAL AND TRANSVAGINAL ULTRASOUND OF PELVIS TECHNIQUE: Both transabdominal and transvaginal ultrasound examinations of the pelvis were performed. Transabdominal technique was performed for global imaging of the pelvis including uterus, ovaries, adnexal regions, and pelvic cul-de-sac. It was necessary to proceed with endovaginal exam following the transabdominal exam to visualize the endometrium and RIGHT ovary. COMPARISON:  None FINDINGS: Uterus Measurements: 10.3 x 7.2 x 6.1 cm = volume: 239 mL. Anteverted. At least 2 large uterine leiomyomata are seen, 6.1 cm diameter submucosal at fundus and 3.1 cm diameter subserosal posterior LEFT uterus. Endometrium Thickness: 10 mm.  No endometrial fluid or mass Right ovary Measurements: 3.4 x 2.3 x 2.1 cm = volume: 8.3 mL. Normal morphology without mass Left ovary Measurements: 3.1 x 1.1 x 1.4 cm = volume: 2.4 mL. Normal morphology without mass Other findings No free pelvic fluid.  No adnexal masses. IMPRESSION: Two uterine leiomyomata, larger of which 6.1 cm diameter is located submucosal at fundus. Unremarkable endometrial complex and  ovaries. Electronically Signed   By: Lavonia Dana M.D.   On: 05/31/2021 10:16      ? ?Assessment and Plan:  ?   ?1. Abnormal uterine bleeding (AUB) ? ?2. Iron deficiency anemia due to chronic blood loss ?Rx: ?- Prenat-Fe Poly-Methfol-FA-DHA (VITAFOL FE+) 90-0.6-0.4-200 MG CAPS; Take 1 capsule by mouth daily before breakfast.  Dispense: 30 capsule; Refill: 11 ? ?3. Fibroids, submucosal ? ?4. Secondary dysmenorrhea ? ?5. Class 3 severe obesity without serious comorbidity with body mass index (BMI) of 40.0 to 44.9 in adult, unspecified obesity type ([place]) ?- weight reduction with the aid of dietary changes, exercise and behavioral modification recommended  ?   ?  ?I discussed the assessment and treatment plan with the patient. The patient was provided an opportunity to ask questions and all were answered. The patient agreed with the plan and demonstrated an understanding of the instructions. ?  ?The patient was advised to call back or seek an in-person evaluation/go to the ED if the symptoms worsen or if the condition fails to improve as anticipated. ? ?I have spent a total of 20 minutes of face-to-face time, excluding clinical staff time, reviewing notes and preparing to see patient, ordering tests and/or medications, and counseling the patient.  ? ? ?Baltazar Najjar, MD ?[facility] [place], [place], [facility] ?06/01/21  ?

## 2021-06-01 NOTE — ED Triage Notes (Signed)
Pt is present today with left ear ache, sore throat, cough, and vomiting. Pt sx started Sunday  ?

## 2021-06-01 NOTE — Discharge Instructions (Addendum)
Your flu and strep test are negative today.  I have ordered a throat culture for confirmatory testing.  If the results are positive, you will be contacted.  The antibiotic you are taking for your ear will cover you if you have strep. ?Increase fluids and get plenty of rest. ?Brat diet until symptoms improve to include bananas, rice, applesauce, and toast.  Also eat foods that are easy to swallow such as soups, broths, jello. ?Follow-up as needed. ?

## 2021-06-01 NOTE — ED Provider Notes (Signed)
?[facility] ? ? ? ?CSN: [id number] ?Arrival date & time: 06/01/21  1742 ? ? ?  ? ?History   ?Chief Complaint ?Chief Complaint  ?Patient presents with  ? Emesis  ? Cough  ? Sore Throat  ? Otalgia  ? ? ?HPI ?Sally Garner is a 36 y.o. female.  ? ?The patient is a 36 year old female who presents with cough, sore throat, left ear pain, nausea and vomiting.  Patient states her symptoms started with a cough and sore throat approximately 3 days ago.  She states today she developed left ear pain with nausea and vomiting.  She has vomited 1 time today.  She states that the left ear pain feels like "fullness".  States that she vomited approximately 1 hour after she ate earlier today.  She denies fever, chills, abdominal pain, shortness of breath, and wheezing.  She states she also developed a headache 1 day ago but that has since resolved.  She denies any sick contacts. ? ?The history is provided by the patient.  ? ?Past Medical History:  ?Diagnosis Date  ? Hypertension   ? was put on medication end of 03/2015; states has caused headache and is not taking as prescribed  ? Migraines   ? Symptomatic cholelithiasis 04/2015  ? ? ?Patient Active Problem List  ? Diagnosis Date Noted  ? Status post repeat low transverse cesarean section 07/23/2017  ? Status post tubal ligation 07/23/2017  ? Preexisting hypertension complicating pregnancy, antepartum 07/20/2017  ? Pregnancy with poor obstetric history   ? Obesity affecting pregnancy in third trimester   ? HTN in pregnancy, chronic   ? Unwanted fertility 03/23/2017  ? Elevated hemoglobin A1c 01/03/2017  ? Chronic hypertension during pregnancy, antepartum 01/02/2017  ? Supervision of high risk pregnancy, antepartum 12/29/2016  ? Pleuritic chest pain 07/12/2013  ? Status post primary low transverse cesarean section--FTP, NRFHR 05/26/2013  ? Anemia 05/24/2013  ? Smoker 05/24/2013  ? Fibroid uterus 05/24/2013  ? Obesity, unspecified--BMI 37.1 05/24/2013  ? Hx of primary  hypertension--2-3 years ago 05/24/2013  ? ? ?Past Surgical History:  ?Procedure Laterality Date  ? CESAREAN SECTION N/A 05/25/2013  ? Procedure: CESAREAN SECTION;  Surgeon: Annalee Genta, DO;  Location: [facility];  Service: Obstetrics;  Laterality: N/A;  ? CESAREAN SECTION N/A 07/23/2017  ? Procedure: CESAREAN SECTION;  Surgeon: Donnamae Jude, MD;  Location: [facility];  Service: Obstetrics;  Laterality: N/A;  ? CHOLECYSTECTOMY N/A 05/05/2015  ? Procedure: LAPAROSCOPIC CHOLECYSTECTOMY;  Surgeon: Coralie Keens, MD;  Location: [facility];  Service: General;  Laterality: N/A;  ? ? ?OB History   ? ? Gravida  ?2  ? Para  ?2  ? Term  ?2  ? Preterm  ?   ? AB  ?   ? Living  ?2  ?  ? ? SAB  ?   ? IAB  ?   ? Ectopic  ?   ? Multiple  ?0  ? Live Births  ?2  ?   ?  ?  ? ? ? ?Home Medications   ? ?Prior to Admission medications   ?Medication Sig Start Date End Date Taking? Authorizing Provider  ?amoxicillin-clavulanate (AUGMENTIN) 875-125 MG tablet Take 1 tablet by mouth every 12 (twelve) hours. 06/01/21  Yes Lauriann Milillo-Warren, Alda Lea, NP  ?fluticasone (FLONASE) 50 MCG/ACT nasal spray Place 2 sprays into both nostrils daily. 06/01/21  Yes Shelvy Heckert-Warren, Alda Lea, NP  ?ondansetron (ZOFRAN) 4 MG tablet Take 1 tablet (4 mg  total) by mouth every 8 (eight) hours as needed for nausea or vomiting. 06/01/21  Yes Brithney Bensen-Warren, Alda Lea, NP  ?albuterol (VENTOLIN HFA) 108 (90 Base) MCG/ACT inhaler Inhale 1-2 puffs into the lungs every 6 (six) hours as needed for wheezing or shortness of breath. ?Patient not taking: Reported on 05/18/2021 06/16/20   Hazel Sams, PA-C  ?hydrochlorothiazide (HYDRODIURIL) 25 MG tablet Take 1 tablet (25 mg total) by mouth daily. 02/08/21   Dorna Mai, MD  ?ibuprofen (ADVIL) 800 MG tablet Take 1 tablet (800 mg total) by mouth every 8 (eight) hours as needed. 05/18/21   Shelly Bombard, MD  ?metoprolol succinate (TOPROL XL) 25 MG 24 hr tablet Take 1 tablet (25 mg total) by mouth  daily. ?Patient not taking: Reported on 05/18/2021 02/08/21   Dorna Mai, MD  ?metroNIDAZOLE (FLAGYL) 500 MG tablet Take 1 tablet (500 mg total) by mouth 2 (two) times daily. 05/21/21   Shelly Bombard, MD  ?Prenat-Fe Poly-Methfol-FA-DHA (VITAFOL FE+) 90-0.6-0.4-200 MG CAPS Take 1 capsule by mouth daily before breakfast. 06/01/21   Shelly Bombard, MD  ?triamcinolone cream (KENALOG) 0.1 % Apply 1 application topically 2 (two) times daily. 10/17/20   Volney American, PA-C  ?amLODipine (NORVASC) 10 MG tablet Take 1 tablet (10 mg total) by mouth daily. ?Patient not taking: Reported on 10/17/2017 07/25/17 01/10/20  Lajean Manes, CNM  ?ferrous sulfate 325 (65 FE) MG tablet Take 1 tablet (325 mg total) by mouth 3 (three) times daily with meals. ?Patient not taking: Reported on 10/17/2017 07/25/17 01/10/20  Lajean Manes, CNM  ? ? ?Family History ?Family History  ?Problem Relation Age of Onset  ? Birth defects Brother   ?     polydactyl  ? Hypertension Maternal Aunt   ? Hypertension Maternal Uncle   ? Cancer Paternal Aunt   ?     breast cancer  ? ? ?Social History ?Social History  ? ?Tobacco Use  ? Smoking status: Some Days  ?  Packs/day: 0.50  ?  Years: 3.00  ?  Pack years: 1.50  ?  Types: Cigarettes  ? Smokeless tobacco: Never  ?Vaping Use  ? Vaping Use: Never used  ?Substance Use Topics  ? Alcohol use: Not Currently  ?  Comment: occasional  ? Drug use: No  ? ? ? ?Allergies   ?Patient has no known allergies. ? ? ?Review of Systems ?Review of Systems  ?Constitutional:  Positive for appetite change and fatigue.  ?HENT:  Positive for ear pain (left) and sore throat. Negative for congestion, rhinorrhea and trouble swallowing.   ?Eyes: Negative.   ?Respiratory:  Positive for cough. Negative for shortness of breath and wheezing.   ?Cardiovascular: Negative.   ?Gastrointestinal:  Positive for diarrhea and vomiting. Negative for abdominal pain and nausea.  ?Skin: Negative.   ?Psychiatric/Behavioral: Negative.     ? ? ?Physical Exam ?Triage Vital Signs ?ED Triage Vitals  ?Enc Vitals Group  ?   BP 06/01/21 1915 121/86  ?   Pulse Rate 06/01/21 1915 (!) 107  ?   Resp 06/01/21 1915 18  ?   Temp 06/01/21 1915 98.5 ?F (36.9 ?C)  ?   Temp src --   ?   SpO2 06/01/21 1915 96 %  ?   Weight --   ?   Height --   ?   Head Circumference --   ?   Peak Flow --   ?   Pain Score 06/01/21 1914 9  ?  Pain Loc --   ?   Pain Edu? --   ?   Excl. in [place]? --   ? ?No data found. ? ?Updated Vital Signs ?BP 121/86   Pulse (!) 107   Temp 98.5 ?F (36.9 ?C)   Resp 18   LMP 05/25/2021 (Approximate)   SpO2 96%   Breastfeeding No  ? ?Visual Acuity ?Right Eye Distance:   ?Left Eye Distance:   ?Bilateral Distance:   ? ?Right Eye Near:   ?Left Eye Near:    ?Bilateral Near:    ? ?Physical Exam ?Vitals reviewed.  ?Constitutional:   ?   General: She is not in acute distress. ?   Appearance: She is well-developed.  ?HENT:  ?   Head: Normocephalic and atraumatic.  ?   Right Ear: Tympanic membrane and ear canal normal.  ?   Left Ear: Ear canal normal. A middle ear effusion is present. Tympanic membrane is erythematous.  ?   Nose: No congestion or rhinorrhea.  ?   Mouth/Throat:  ?   Mouth: Mucous membranes are moist.  ?   Pharynx: Pharyngeal swelling and posterior oropharyngeal erythema present.  ?   Tonsils: No tonsillar exudate. 1+ on the right. 1+ on the left.  ?Eyes:  ?   Conjunctiva/sclera: Conjunctivae normal.  ?   Pupils: Pupils are equal, round, and reactive to light.  ?Cardiovascular:  ?   Rate and Rhythm: Normal rate and regular rhythm.  ?   Heart sounds: Normal heart sounds.  ?Pulmonary:  ?   Effort: Pulmonary effort is normal.  ?   Breath sounds: Normal breath sounds.  ?Abdominal:  ?   General: Bowel sounds are normal.  ?   Palpations: Abdomen is soft.  ?   Tenderness: There is no abdominal tenderness.  ?Musculoskeletal:  ?   Cervical back: Normal range of motion and neck supple.  ?Lymphadenopathy:  ?   Cervical: No cervical adenopathy.  ?Skin: ?    General: Skin is warm and dry.  ?   Capillary Refill: Capillary refill takes less than 2 seconds.  ?Neurological:  ?   General: No focal deficit present.  ?   Mental Status: She is alert and oriented to person, place, and ti

## 2021-06-04 ENCOUNTER — Other Ambulatory Visit: Payer: Self-pay

## 2021-06-04 ENCOUNTER — Ambulatory Visit (HOSPITAL_COMMUNITY)
Admission: RE | Admit: 2021-06-04 | Discharge: 2021-06-04 | Disposition: A | Discharge status: Home / Self Care | Payer: Medicaid Other | Source: Ambulatory Visit | Attending: Internal Medicine | Admitting: Internal Medicine

## 2021-06-04 ENCOUNTER — Encounter (HOSPITAL_COMMUNITY): Payer: Self-pay

## 2021-06-04 VITALS — BP 115/76 | HR 106 | Temp 98.5°F | Resp 17 | Ht 64.0 in | Wt 246.0 lb

## 2021-06-04 DIAGNOSIS — S01512A Laceration without foreign body of oral cavity, initial encounter: Secondary | ICD-10-CM | POA: Diagnosis not present

## 2021-06-04 LAB — CULTURE, GROUP A STREP (THRC)

## 2021-06-04 MED ORDER — LIDOCAINE VISCOUS HCL 2 % MT SOLN: 15.0000 mL | OROMUCOSAL | 0 refills | Status: DC | PRN

## 2021-06-04 MED ORDER — CHLORHEXIDINE GLUCONATE 0.12 % MT SOLN
15.0000 mL | Freq: Two times a day (BID) | OROMUCOSAL | 0 refills | Status: DC
Start: 2021-06-04 — End: 2021-08-04

## 2021-06-04 NOTE — ED Provider Notes (Signed)
?[facility] ? ? ? ?CSN: [id number] ?Arrival date & time: 06/04/21  1515 ? ? ?  ? ?History   ?Chief Complaint ?Chief Complaint  ?Patient presents with  ? Facial Pain  ? ? ?HPI ?Sally Garner is a 36 y.o. female comes to the urgent care for left-sided facial pain which happened after she fell this morning.  Patient was cleaning her children's room when she lost her balance and fell.  She hit the left side of her face.  She denies passing out or losing consciousness.  Patient sustained a laceration on the left upper lip.  Pain is currently of moderate severity, throbbing, constant, aggravated by palpation with no known relieving factors.  She has not tried any over-the-counter medication.  No double vision or blurry vision.  No nausea or vomiting..  ? ?HPI ? ?Past Medical History:  ?Diagnosis Date  ? Hypertension   ? was put on medication end of 03/2015; states has caused headache and is not taking as prescribed  ? Migraines   ? Symptomatic cholelithiasis 04/2015  ? ? ?Patient Active Problem List  ? Diagnosis Date Noted  ? Status post repeat low transverse cesarean section 07/23/2017  ? Status post tubal ligation 07/23/2017  ? Preexisting hypertension complicating pregnancy, antepartum 07/20/2017  ? Pregnancy with poor obstetric history   ? Obesity affecting pregnancy in third trimester   ? HTN in pregnancy, chronic   ? Unwanted fertility 03/23/2017  ? Elevated hemoglobin A1c 01/03/2017  ? Chronic hypertension during pregnancy, antepartum 01/02/2017  ? Supervision of high risk pregnancy, antepartum 12/29/2016  ? Pleuritic chest pain 07/12/2013  ? Status post primary low transverse cesarean section--FTP, NRFHR 05/26/2013  ? Anemia 05/24/2013  ? Smoker 05/24/2013  ? Fibroid uterus 05/24/2013  ? Obesity, unspecified--BMI 37.1 05/24/2013  ? Hx of primary hypertension--2-3 years ago 05/24/2013  ? ? ?Past Surgical History:  ?Procedure Laterality Date  ? CESAREAN SECTION N/A 05/25/2013  ? Procedure: CESAREAN SECTION;   Surgeon: Annalee Genta, DO;  Location: [facility];  Service: Obstetrics;  Laterality: N/A;  ? CESAREAN SECTION N/A 07/23/2017  ? Procedure: CESAREAN SECTION;  Surgeon: Donnamae Jude, MD;  Location: [facility];  Service: Obstetrics;  Laterality: N/A;  ? CHOLECYSTECTOMY N/A 05/05/2015  ? Procedure: LAPAROSCOPIC CHOLECYSTECTOMY;  Surgeon: Coralie Keens, MD;  Location: [facility];  Service: General;  Laterality: N/A;  ? ? ?OB History   ? ? Gravida  ?2  ? Para  ?2  ? Term  ?2  ? Preterm  ?   ? AB  ?   ? Living  ?2  ?  ? ? SAB  ?   ? IAB  ?   ? Ectopic  ?   ? Multiple  ?0  ? Live Births  ?2  ?   ?  ?  ? ? ? ?Home Medications   ? ?Prior to Admission medications   ?Medication Sig Start Date End Date Taking? Authorizing Provider  ?chlorhexidine (PERIDEX) 0.12 % solution Use as directed 15 mLs in the mouth or throat 2 (two) times daily. 06/04/21  Yes Dlisa Barnwell, Myrene Galas, MD  ?lidocaine (XYLOCAINE) 2 % solution Use as directed 15 mLs in the mouth or throat as needed for mouth pain. 06/04/21  Yes Willena Jeancharles, Myrene Galas, MD  ?amoxicillin-clavulanate (AUGMENTIN) 875-125 MG tablet Take 1 tablet by mouth every 12 (twelve) hours. 06/01/21   Leath-Warren, Alda Lea, NP  ?fluticasone (FLONASE) 50 MCG/ACT nasal spray Place 2 sprays into both  nostrils daily. 06/01/21   Leath-Warren, Alda Lea, NP  ?hydrochlorothiazide (HYDRODIURIL) 25 MG tablet Take 1 tablet (25 mg total) by mouth daily. 02/08/21   Dorna Mai, MD  ?ibuprofen (ADVIL) 800 MG tablet Take 1 tablet (800 mg total) by mouth every 8 (eight) hours as needed. 05/18/21   Shelly Bombard, MD  ?metroNIDAZOLE (FLAGYL) 500 MG tablet Take 1 tablet (500 mg total) by mouth 2 (two) times daily. 05/21/21   Shelly Bombard, MD  ?ondansetron (ZOFRAN) 4 MG tablet Take 1 tablet (4 mg total) by mouth every 8 (eight) hours as needed for nausea or vomiting. 06/01/21   Leath-Warren, Alda Lea, NP  ?Prenat-Fe Poly-Methfol-FA-DHA (VITAFOL FE+) 90-0.6-0.4-200 MG CAPS Take 1 capsule  by mouth daily before breakfast. 06/01/21   Shelly Bombard, MD  ?triamcinolone cream (KENALOG) 0.1 % Apply 1 application topically 2 (two) times daily. 10/17/20   Volney American, PA-C  ?amLODipine (NORVASC) 10 MG tablet Take 1 tablet (10 mg total) by mouth daily. ?Patient not taking: Reported on 10/17/2017 07/25/17 01/10/20  Lajean Manes, CNM  ?ferrous sulfate 325 (65 FE) MG tablet Take 1 tablet (325 mg total) by mouth 3 (three) times daily with meals. ?Patient not taking: Reported on 10/17/2017 07/25/17 01/10/20  Lajean Manes, CNM  ? ? ?Family History ?Family History  ?Problem Relation Age of Onset  ? Birth defects Brother   ?     polydactyl  ? Hypertension Maternal Aunt   ? Hypertension Maternal Uncle   ? Cancer Paternal Aunt   ?     breast cancer  ? ? ?Social History ?Social History  ? ?Tobacco Use  ? Smoking status: Some Days  ?  Packs/day: 0.50  ?  Years: 3.00  ?  Pack years: 1.50  ?  Types: Cigarettes  ? Smokeless tobacco: Never  ?Vaping Use  ? Vaping Use: Never used  ?Substance Use Topics  ? Alcohol use: Not Currently  ?  Comment: occasional  ? Drug use: No  ? ? ? ?Allergies   ?Patient has no known allergies. ? ? ?Review of Systems ?Review of Systems ?As per HPI ? ?Physical Exam ?Triage Vital Signs ?ED Triage Vitals  ?Enc Vitals Group  ?   BP 06/04/21 1545 115/76  ?   Pulse Rate 06/04/21 1545 (!) 106  ?   Resp 06/04/21 1545 17  ?   Temp 06/04/21 1545 98.5 ?F (36.9 ?C)  ?   Temp Source 06/04/21 1545 Oral  ?   SpO2 06/04/21 1545 97 %  ?   Weight 06/04/21 1543 246 lb (111.6 kg)  ?   Height 06/04/21 1543 '5\' 4"'$  (1.626 m)  ?   Head Circumference --   ?   Peak Flow --   ?   Pain Score 06/04/21 1543 6  ?   Pain Loc --   ?   Pain Edu? --   ?   Excl. in [place]? --   ? ?No data found. ? ?Updated Vital Signs ?BP 115/76 (BP Location: Left Arm)   Pulse (!) 106   Temp 98.5 ?F (36.9 ?C) (Oral)   Resp 17   Ht '5\' 4"'$  (1.626 m)   Wt 111.6 kg   LMP 05/25/2021 (Approximate)   SpO2 97%   BMI 42.23 kg/m?   ? ?Visual Acuity ?Right Eye Distance:   ?Left Eye Distance:   ?Bilateral Distance:   ? ?Right Eye Near:   ?Left Eye Near:    ?Bilateral Near:    ? ?  Physical Exam ?Vitals and nursing note reviewed.  ?Constitutional:   ?   General: She is in acute distress.  ?   Appearance: She is not ill-appearing.  ?Cardiovascular:  ?   Rate and Rhythm: Normal rate and regular rhythm.  ?Skin: ?   Capillary Refill: Capillary refill takes less than 2 seconds.  ?   Comments: 1 inch superficial laceration on the inner surface of the left upper lip.  Bleeding is controlled.  Swelling is minimal.  No injury to her gum or teeth.  ?Neurological:  ?   General: No focal deficit present.  ?   Mental Status: She is alert and oriented to person, place, and time.  ?   Cranial Nerves: No cranial nerve deficit.  ?   Sensory: No sensory deficit.  ?   Motor: No weakness.  ?   Coordination: Coordination normal.  ? ? ? ?UC Treatments / Results  ?Labs ?(all labs ordered are listed, but only abnormal results are displayed) ?Labs Reviewed - No data to display ? ?EKG ? ? ?Radiology ?No results found. ? ?Procedures ?Procedures (including critical care time) ? ?Medications Ordered in UC ?Medications - No data to display ? ?Initial Impression / Assessment and Plan / UC Course  ?I have reviewed the triage vital signs and the nursing notes. ? ?Pertinent labs & imaging results that were available during my care of the patient were reviewed by me and considered in my medical decision making (see chart for details). ? ?  ? ?1.  Laceration involving the internal surface of the left upper lip: ?Laceration is superficial and laceration repair is not indicated ?Continue chlorhexidine mouth rinse ?Ibuprofen as needed for facial pain ?Lidocaine gel as needed for pain. ?Avoid hot liquids or foods ?Please eat soft foods ?If you have any concerns please return to urgent care to be reevaluated. ?Return precautions given ? ?Final Clinical Impressions(s) / UC Diagnoses   ? ?Final diagnoses:  ?Laceration of internal mouth, initial encounter  ? ? ? ?Discharge Instructions   ? ?  ?Use mouth rinse as prescribed ?Avoid hot foods ?Please eat soft foods ?Maintain adequate hydration ?Apply lido

## 2021-06-04 NOTE — Discharge Instructions (Addendum)
Use mouth rinse as prescribed ?Avoid hot foods ?Please eat soft foods ?Maintain adequate hydration ?Apply lidocaine gel as needed for pain ?Ibuprofen as needed for facial pain. ?Return to urgent care if you have worsening lip pain, swelling, redness or discharge.  Return to urgent care if you have worsening headaches, confusion, dizziness or blurry vision or persistent vomiting. ?

## 2021-06-04 NOTE — ED Triage Notes (Addendum)
Pt reports left side facial pain and laceration on upper lip after a fall today. Denies LOC ?

## 2021-06-28 ENCOUNTER — Ambulatory Visit (INDEPENDENT_AMBULATORY_CARE_PROVIDER_SITE_OTHER): Payer: Medicaid Other | Admitting: Obstetrics and Gynecology

## 2021-06-28 ENCOUNTER — Encounter: Payer: Self-pay | Admitting: Obstetrics and Gynecology

## 2021-06-28 VITALS — BP 127/86 | HR 85 | Wt 238.0 lb

## 2021-06-28 DIAGNOSIS — N920 Excessive and frequent menstruation with regular cycle: Secondary | ICD-10-CM | POA: Diagnosis not present

## 2021-06-28 DIAGNOSIS — D259 Leiomyoma of uterus, unspecified: Secondary | ICD-10-CM | POA: Diagnosis not present

## 2021-06-28 MED ORDER — NORETHINDRONE ACETATE 5 MG PO TABS: 5.0000 mg | ORAL_TABLET | Freq: Every day | ORAL | 2 refills | Status: DC

## 2021-06-28 NOTE — Progress Notes (Signed)
Sally Garner presents for eval for hyst due to [facility] and uterine fibroids ?Pt reports [facility] for the last 3-4 yrs. Cramps, clots, last 7 days, heavy days 1 -2, pads and tampons, change q 1hr. ?GYN U/S uterine fibroids 3 and 6 cm, vol 239.  ?H/O LTCS x 2 ?H/O BTL ?H/O HTN and pre diabetic.  ?Pap UTD ? ?PE AF VSS ?Chaperone present during exam ? ?Lungs clear Heart RRR ?Abd soft + BS ?GU nl EGBUS, cycle present, uterus @ 12 weeks, mobile, slightly tender ? ?A/P  [facility] ?        Uterine fibroids ? ?Tx options reviewed with pt. Pt desires definite therapy TVH with BS reviewed with pt. R/B/Post op care discussed. Information provided. Hyst papers signed. Will start Aygestin qd in the mean time. Will schedule. F/U with post op appt. ? ?

## 2021-06-28 NOTE — Patient Instructions (Signed)
Vaginal Hysterectomy, Care After ?The following information offers guidance on how to care for yourself after your procedure. Your health care provider may also give you more specific instructions. If you have problems or questions, contact your health care provider. ?What can I expect after the procedure? ?After the procedure, it is common to have: ?Pain in the lower abdomen and vagina. ?Vaginal bleeding and discharge for up to 1 week. You will need to use a sanitary pad after this procedure. ?Difficulty having a bowel movement (constipation). ?Temporary problems emptying the bladder. ?Tiredness (fatigue). ?Poor appetite. ?Less interest in sex. ?Feelings of sadness or other emotions. ?If your ovaries were also removed, it is also common to have symptoms of menopause, such as hot flashes, night sweats, and lack of sleep (insomnia). ?Follow these instructions at home: ?Medicines ? ?Take over-the-counter and prescription medicines only as told by your health care provider. ?Do not take aspirin or NSAIDs, such as ibuprofen. These medicines can cause bleeding. ?Ask your health care provider if the medicine prescribed to you: ?Requires you to avoid driving or using machinery. ?Can cause constipation. You may need to take these actions to prevent or treat constipation: ?Drink enough fluid to keep your urine pale yellow. ?Take over-the-counter or prescription medicines. ?Eat foods that are high in fiber, such as beans, whole grains, and fresh fruits and vegetables. ?Limit foods that are high in fat and processed sugars, such as fried or sweet foods. ?Activity ? ?Rest as told by your health care provider. ?Return to your normal activities as told by your health care provider. Ask your health care provider what activities are safe for you ?Avoid sitting for a long time without moving. Get up to take short walks every 1-2 hours. This is important to improve blood flow and breathing. Ask for help if you feel weak or  unsteady. ?Try to have someone home with you for 1-2 weeks to help you with everyday chores. ?Do not lift anything that is heavier than 10 lb (4.5 kg), or the limit that you are told, until your health care provider says that it is safe. ?If you were given a sedative during the procedure, it can affect you for several hours. Do not drive or operate machinery until your health care provider says that it is safe. ?Lifestyle ?Do not use any products that contain nicotine or tobacco. These products include cigarettes, chewing tobacco, and vaping devices, such as e-cigarettes. These can delay healing after surgery. If you need help quitting, ask your health care provider. ?Do not drink alcohol until your health care provider approves. ?General instructions ?Do not douche, use tampons, or have sex for at least 6 weeks, or as told by your health care provider. ?If you struggle with physical or emotional changes after your procedure, speak with your health care provider or a therapist. ?The stitches inside your vagina will dissolve over time and do not need to be taken out. ?Do not take baths, swim, or use a hot tub until your health care provider approves. You may only be allowed to take showers for 2-3 weeks ?Wear compression stockings as told by your health care provider. These stockings help to prevent blood clots and reduce swelling in your legs. ?Keep all follow-up visits. This is important. ?Contact a health care provider if: ?Your pain medicine is not helping. ?You have a fever. ?You have nausea or vomiting that does not go away. ?You feel dizzy. ?You have blood, pus, or a bad-smelling discharge from your vagina  more than 1 week after the procedure. ?You continue to have trouble urinating 3-5 days after the procedure. ?Get help right away if: ?You have severe pain in your abdomen or back. ?You faint. ?You have heavy vaginal bleeding and blood clots, soaking through a sanitary pad in less than 1 hour. ?You have chest  pain or shortness of breath. ?You have pain, swelling, or redness in your leg. ?These symptoms may represent a serious problem that is an emergency. Do not wait to see if the symptoms will go away. Get medical help right away. Call your local emergency services (911 in the [place] Do not drive yourself to the hospital. ?Summary ?After the procedure, it is common to have pain, vaginal bleeding, constipation, temporary problems emptying your bladder, and feelings of sadness or other emotions. ?Take over-the-counter and prescription medicines only as told by your health care provider. ?Rest as told by your health care provider. Return to your normal activities as told by your health care provider. ?Contact a health care provider if your pain medicine is not helping, or you have a fever, dizziness, or trouble urinating several days after the procedure. ?Get help right away if you have severe pain in your abdomen or back, or if you faint, have heavy bleeding, or have chest pain or shortness of breath. ?This information is not intended to replace advice given to you by your health care provider. Make sure you discuss any questions you have with your health care provider. ?Document Revised: 10/11/2019 Document Reviewed: 10/11/2019 ?Elsevier Patient Education ? [place]. ?Vaginal Hysterectomy ? ?A vaginal hysterectomy is a procedure to remove all or part of the uterus through a small incision in the vagina. In this procedure, your health care provider may remove your entire uterus, including the cervix. The cervix is the opening and bottom part of the uterus and is located between the vagina and the uterus. Sometimes, the ovaries and fallopian tubes are also removed. ?This surgery may be done to treat problems such as: ?Noncancerous growths in the uterus (uterine fibroids) that cause symptoms. ?A condition that causes the lining of the uterus to grow in other areas (endometriosis). ?Problems with pelvic  support. ?Cancer of the cervix, ovaries, uterus, or tissue that lines the uterus (endometrium). ?Excessive bleeding in the uterus. ?When removing your uterus, your health care provider may also remove the ovaries and the fallopian tubes. After this procedure, you will no longer be able to have a baby, and you will no longer have a menstrual period. ?Tell a health care provider about: ?Any allergies you have. ?All medicines you are taking, including vitamins, herbs, eye drops, creams, and over-the-counter medicines. ?Any problems you or family members have had with anesthetic medicines. ?Any blood disorders you have. ?Any surgeries you have had. ?Any medical conditions you have. ?Whether you are pregnant or may be pregnant. ?What are the risks? ?Generally, this is a safe procedure. However, problems may occur, including: ?Bleeding. ?Infection. ?Blood clots in the legs or lungs. ?Damage to nearby structures or organs. ?Pain during sex. ?Allergic reactions to medicines. ?What happens before the procedure? ?Staying hydrated ?Follow instructions from your health care provider about hydration, which may include: ?Up to 2 hours before the procedure - you may continue to drink clear liquids, such as water, clear fruit juice, black coffee, and plain tea. ? ?Eating and drinking restrictions ?Follow instructions from your health care provider about eating and drinking, which may include: ?8 hours before the procedure - stop eating  heavy meals or foods, such as meat, fried foods, or fatty foods. ?6 hours before the procedure - stop eating light meals or foods, such as toast or cereal. ?6 hours before the procedure - stop drinking milk or drinks that contain milk. ?2 hours before the procedure - stop drinking clear liquids. ?Medicines ?Ask your health care provider about: ?Changing or stopping your regular medicines. This is especially important if you are taking diabetes medicines or blood thinners. ?Taking medicines such as  aspirin and ibuprofen. These medicines can thin your blood. Do not take these medicines unless your health care provider tells you to take them. ?Taking over-the-counter medicines, vitamins, herbs, and supplements. ?You may

## 2021-06-28 NOTE — Progress Notes (Signed)
Pt is in office to discuss possible surgery for AUB. ? ?Pt is on cycle today- heavy flow.  ?Pt uses tampons and pads and has severe menstrual pain.  ? ? ? ?

## 2021-07-07 ENCOUNTER — Emergency Department (HOSPITAL_COMMUNITY): Payer: Medicaid Other

## 2021-07-07 ENCOUNTER — Emergency Department (HOSPITAL_COMMUNITY)
Admission: EM | Admit: 2021-07-07 | Discharge: 2021-07-07 | Disposition: A | Discharge status: Home / Self Care | Payer: Medicaid Other | Attending: Emergency Medicine | Admitting: Emergency Medicine

## 2021-07-07 DIAGNOSIS — B9789 Other viral agents as the cause of diseases classified elsewhere: Secondary | ICD-10-CM | POA: Diagnosis not present

## 2021-07-07 DIAGNOSIS — Z20822 Contact with and (suspected) exposure to covid-19: Secondary | ICD-10-CM | POA: Insufficient documentation

## 2021-07-07 DIAGNOSIS — R0602 Shortness of breath: Secondary | ICD-10-CM | POA: Diagnosis not present

## 2021-07-07 DIAGNOSIS — R079 Chest pain, unspecified: Secondary | ICD-10-CM | POA: Diagnosis not present

## 2021-07-07 DIAGNOSIS — R059 Cough, unspecified: Secondary | ICD-10-CM | POA: Diagnosis not present

## 2021-07-07 DIAGNOSIS — J069 Acute upper respiratory infection, unspecified: Secondary | ICD-10-CM | POA: Insufficient documentation

## 2021-07-07 DIAGNOSIS — R0789 Other chest pain: Secondary | ICD-10-CM | POA: Diagnosis not present

## 2021-07-07 LAB — CBC
HCT: 32.6 % — ABNORMAL LOW (ref 36.0–46.0)
Hemoglobin: 10.2 g/dL — ABNORMAL LOW (ref 12.0–15.0)
MCH: 26 pg (ref 26.0–34.0)
MCHC: 31.3 g/dL (ref 30.0–36.0)
MCV: 83 fL (ref 80.0–100.0)
Platelets: 332 10*3/uL (ref 150–400)
RBC: 3.93 MIL/uL (ref 3.87–5.11)
RDW: 14.4 % (ref 11.5–15.5)
WBC: 8.6 10*3/uL (ref 4.0–10.5)
nRBC: 0 % (ref 0.0–0.2)

## 2021-07-07 LAB — TROPONIN I (HIGH SENSITIVITY)
Troponin I (High Sensitivity): 3 ng/L (ref <18)
Troponin I (High Sensitivity): 3 ng/L (ref <18)

## 2021-07-07 LAB — BASIC METABOLIC PANEL
Anion gap: 7 (ref 5–15)
BUN: 5 mg/dL — ABNORMAL LOW (ref 6–20)
CO2: 24 mmol/L (ref 22–32)
Calcium: 8.7 mg/dL — ABNORMAL LOW (ref 8.9–10.3)
Chloride: 106 mmol/L (ref 98–111)
Creatinine, Ser: 0.69 mg/dL (ref 0.44–1.00)
GFR, Estimated: >60 mL/min (ref >60)
Glucose, Bld: 105 mg/dL — ABNORMAL HIGH (ref 70–99)
Potassium: 3.6 mmol/L (ref 3.5–5.1)
Sodium: 137 mmol/L (ref 135–145)

## 2021-07-07 LAB — I-STAT BETA HCG BLOOD, ED (MC, WL, AP ONLY): I-stat hCG, quantitative: <5 m[IU]/mL (ref <5)

## 2021-07-07 LAB — RESP PANEL BY RT-PCR (FLU A&B, COVID) ARPGX2
Influenza A by PCR: NEGATIVE
Influenza B by PCR: NEGATIVE
SARS Coronavirus 2 by RT PCR: NEGATIVE

## 2021-07-07 MED ORDER — ALBUTEROL SULFATE HFA 108 (90 BASE) MCG/ACT IN AERS
1.0000 | INHALATION_SPRAY | Freq: Once | RESPIRATORY_TRACT | Status: AC
Start: 2021-07-07 — End: 2021-07-07
  Administered 2021-07-07: 1 via RESPIRATORY_TRACT
  Filled 2021-07-07: qty 6.7

## 2021-07-07 MED ORDER — BENZONATATE 100 MG PO CAPS: 100.0000 mg | ORAL_CAPSULE | Freq: Three times a day (TID) | ORAL | 0 refills | Status: DC | PRN

## 2021-07-07 MED ORDER — GUAIFENESIN ER 600 MG PO TB12: 1200.0000 mg | ORAL_TABLET | Freq: Two times a day (BID) | ORAL | 0 refills | Status: DC

## 2021-07-07 NOTE — ED Provider Notes (Signed)
?[facility] ?Provider Note ? ? ?CSN: [id number] ?Arrival date & time: 07/07/21  1710 ? ?  ? ?History ? ?Chief Complaint  ?Patient presents with  ? Cough  ? Shortness of Breath  ? Nasal Congestion  ? ? ?Sally Garner is a 36 y.o. female. ? ?HPI ? ?36 year old female presents the emergency department with nasal congestion, intermittently productive cough, runny nose and fatigue.  Patient states both of her kids are currently sick with an upper respiratory infection.  Over the last couple days she is come down with similar symptoms.  Denies any fever but endorses chills.  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  Currently she is chest pain-free.  No radiation of pain to her back, abdomen or flank.  No swelling of her lower extremities.  No GI symptoms.  Did try 1 dose of over-the-counter medications without relief l.  No ongoing shortness of breath, no hemoptysis. ? ?Home Medications ?Prior to Admission medications   ?Medication Sig Start Date End Date Taking? Authorizing Provider  ?benzonatate (TESSALON) 100 MG capsule Take 1 capsule (100 mg total) by mouth 3 (three) times daily as needed for cough. 07/07/21  Yes Naia Ruff, Alvin Critchley, DO  ?guaiFENesin (MUCINEX) 600 MG 12 hr tablet Take 2 tablets (1,200 mg total) by mouth 2 (two) times daily. 07/07/21  Yes Talana Slatten, Alvin Critchley, DO  ?chlorhexidine (PERIDEX) 0.12 % solution Use as directed 15 mLs in the mouth or throat 2 (two) times daily. 06/04/21   LampteyMyrene Galas, MD  ?hydrochlorothiazide (HYDRODIURIL) 25 MG tablet Take 1 tablet (25 mg total) by mouth daily. 02/08/21   Dorna Mai, MD  ?ibuprofen (ADVIL) 800 MG tablet Take 1 tablet (800 mg total) by mouth every 8 (eight) hours as needed. 05/18/21   Shelly Bombard, MD  ?lidocaine (XYLOCAINE) 2 % solution Use as directed 15 mLs in the mouth or throat as needed for mouth pain. 06/04/21   LampteyMyrene Galas, MD  ?norethindrone  (AYGESTIN) 5 MG tablet Take 1 tablet (5 mg total) by mouth daily. 06/28/21   Chancy Milroy, MD  ?ondansetron (ZOFRAN) 4 MG tablet Take 1 tablet (4 mg total) by mouth every 8 (eight) hours as needed for nausea or vomiting. 06/01/21   Leath-Warren, Alda Lea, NP  ?Prenat-Fe Poly-Methfol-FA-DHA (VITAFOL FE+) 90-0.6-0.4-200 MG CAPS Take 1 capsule by mouth daily before breakfast. 06/01/21   Shelly Bombard, MD  ?triamcinolone cream (KENALOG) 0.1 % Apply 1 application topically 2 (two) times daily. 10/17/20   Volney American, PA-C  ?amLODipine (NORVASC) 10 MG tablet Take 1 tablet (10 mg total) by mouth daily. ?Patient not taking: Reported on 10/17/2017 07/25/17 01/10/20  Lajean Manes, CNM  ?ferrous sulfate 325 (65 FE) MG tablet Take 1 tablet (325 mg total) by mouth 3 (three) times daily with meals. ?Patient not taking: Reported on 10/17/2017 07/25/17 01/10/20  Lajean Manes, CNM  ?   ? ?Allergies    ?Patient has no known allergies.   ? ?Review of Systems   ?Review of Systems  ?Constitutional:  Positive for chills and fatigue. Negative for fever.  ?HENT:  Positive for congestion and rhinorrhea.   ?Respiratory:  Positive for cough. Negative for shortness of breath.   ?Cardiovascular:  Positive for chest pain. Negative for palpitations and leg swelling.  ?Gastrointestinal:  Negative for abdominal pain, diarrhea and vomiting.  ?Genitourinary:  Negative for flank pain.  ?Musculoskeletal:  Negative for  back pain.  ?Skin:  Negative for rash.  ?Neurological:  Negative for headaches.  ? ?Physical Exam ?Updated Vital Signs ?BP (!) 131/104   Pulse 97   Temp 98.2 ?F (36.8 ?C) (Oral)   Resp 16   LMP 06/27/2021   SpO2 100%  ?Physical Exam ?Vitals and nursing note reviewed.  ?Constitutional:   ?   General: She is not in acute distress. ?   Appearance: Normal appearance. She is not diaphoretic.  ?HENT:  ?   Head: Normocephalic.  ?   Mouth/Throat:  ?   Mouth: Mucous membranes are moist.  ?Cardiovascular:  ?   Rate and  Rhythm: Normal rate.  ?Pulmonary:  ?   Effort: Pulmonary effort is normal. No tachypnea or respiratory distress.  ?   Breath sounds: Examination of the right-lower field reveals decreased breath sounds. Examination of the left-lower field reveals decreased breath sounds. Decreased breath sounds and wheezing present. No rales.  ?Chest:  ?   Chest wall: No tenderness.  ?Abdominal:  ?   Palpations: Abdomen is soft.  ?   Tenderness: There is no abdominal tenderness.  ?Musculoskeletal:  ?   Right lower leg: No edema.  ?   Left lower leg: No edema.  ?Skin: ?   General: Skin is warm.  ?Neurological:  ?   Mental Status: She is alert and oriented to person, place, and time. Mental status is at baseline.  ?Psychiatric:     ?   Mood and Affect: Mood normal.  ? ? ?ED Results / Procedures / Treatments   ?Labs ?(all labs ordered are listed, but only abnormal results are displayed) ?Labs Reviewed  ?BASIC METABOLIC PANEL - Abnormal; Notable for the following components:  ?    Result Value  ? Glucose, Bld 105 (*)   ? BUN 5 (*)   ? Calcium 8.7 (*)   ? All other components within normal limits  ?CBC - Abnormal; Notable for the following components:  ? Hemoglobin 10.2 (*)   ? HCT 32.6 (*)   ? All other components within normal limits  ?RESP PANEL BY RT-PCR (FLU A&B, COVID) ARPGX2  ?I-STAT BETA HCG BLOOD, ED ([facility], [facility], [facility] ONLY)  ?TROPONIN I (HIGH SENSITIVITY)  ?TROPONIN I (HIGH SENSITIVITY)  ? ? ?EKG ?EKG Interpretation ? ?Date/Time:  Wednesday Jul 07 2021 17:56:21 EDT ?Ventricular Rate:  100 ?PR Interval:  146 ?QRS Duration: 88 ?QT Interval:  354 ?QTC Calculation: 456 ?R Axis:   66 ?Text Interpretation: Normal sinus rhythm Normal ECG When compared with ECG of 28-Nov-2020 16:47, PREVIOUS ECG IS PRESENT Confirmed by Lavenia Atlas [phone]) on 07/07/2021 10:34:32 PM ? ?Radiology ?DG Chest 2 View ? ?Result Date: 07/07/2021 ?CLINICAL DATA:  Chest pain, cough, congestion, shortness of breath for 3 days. EXAM: CHEST - 2 VIEW COMPARISON:  Chest  radiograph dated 01/19/2021. FINDINGS: The heart size and mediastinal contours are within normal limits. Both lungs are clear. The visualized skeletal structures are unremarkable. IMPRESSION: No active cardiopulmonary disease. Electronically Signed   By: Zerita Boers M.D.   On: 07/07/2021 18:46   ? ?Procedures ?Procedures  ? ? ?Medications Ordered in ED ?Medications  ?albuterol (VENTOLIN HFA) 108 (90 Base) MCG/ACT inhaler 1 puff (1 puff Inhalation Given 07/07/21 2327)  ? ? ?ED Course/ Medical Decision Making/ A&P ?  ?                        ?Medical Decision Making ?Risk ?OTC drugs. ?Prescription drug management. ? ? ?  36 year old female presents emergency department with fatigue, chills, nasal congestion/runny nose, intermittently productive cough, chest pain.  Currently her kids are sick with what appears to be a viral upper respiratory infection.  No active chest pain on my exam.  No shortness of breath, no swelling of her lower extremities or hemoptysis, no findings of DVT.  Was tachycardic in triage but normal heart rate on my evaluation.   ? ?She has scattered wheezes and a congested cough on exam.  Blood work is reassuring, cardiac enzymes are negative, EKG shows no ischemic changes/rightward axis or PE findings.  It is noted that the patient is on norethindrone.  At this time I have low suspicion for PE at this time given no ongoing tachycardia, hypoxia, tachypnea or other acute findings on exam.  This sounds infectious, viral upper respiratory infection.  Plan to treat symptomatically with strict return to ED precautions.  Patient at this time appears safe and stable for discharge and close outpatient follow up. Discharge plan and strict return to ED precautions discussed, patient verbalizes understanding and agreement. ? ? ? ? ? ? ? ?Final Clinical Impression(s) / ED Diagnoses ?Final diagnoses:  ?Viral URI with cough  ? ? ?Rx / DC Orders ?ED Discharge Orders   ? ?      Ordered  ?  guaiFENesin (MUCINEX) 600  MG 12 hr tablet  2 times daily       ? 07/07/21 2308  ?  benzonatate (TESSALON) 100 MG capsule  3 times daily PRN       ? 07/07/21 2308  ? ?  ?  ? ?  ? ? ?  ?Lorelle Gibbs, DO ?07/07/21 2333 ? ?

## 2021-07-07 NOTE — ED Triage Notes (Signed)
Pt c/o cough, congestion, SHOB x3 days. Pt also endorses new-onset reproducible CP that began today after moving something at work. Advises that children have been sick at home w similar symptoms. OTC medication w little relief  ?

## 2021-07-07 NOTE — Discharge Instructions (Signed)
You have been seen and discharged from the emergency department.  Take medications as prescribed, use inhaler as needed.  Follow-up with your primary provider for further evaluation and further care. Take home medications as prescribed. If you have any worsening symptoms, severe chest pain, difficulty breathing, fast heart rate or further concerns for your health please return to an emergency department for further evaluation. ?

## 2021-07-07 NOTE — ED Notes (Signed)
Pt ambulated independently with steady gait from room down the hall to the restroom  ?

## 2021-07-07 NOTE — ED Provider Triage Note (Signed)
Emergency Medicine Provider Triage Evaluation Note ? ?Carmin Muskrat , a 36 y.o. female  was evaluated in triage.  Pt complains of CP started today worse with movement of arms and coughing no hemoptysis. States she has had cough and runny nose for 3 days - she is requesting CP workup. ? ?Review of Systems  ?Positive: Cough, congestion, CP ?Negative: Fever  ? ?Physical Exam  ?BP (!) 163/101 (BP Location: Right Arm)   Pulse (!) 108   Temp 98.8 ?F (37.1 ?C) (Oral)   Resp 15   LMP 06/27/2021   SpO2 100%  ?Gen:   Awake, no distress   ?Resp:  Normal effort ?MSK:   Moves extremities without difficulty  ?Other:  RRR ? ?Medical Decision Making  ?Medically screening exam initiated at 6:09 PM.  Appropriate orders placed.  Winnona Wargo was informed that the remainder of the evaluation will be completed by another provider, this initial triage assessment does not replace that evaluation, and the importance of remaining in the ED until their evaluation is complete. ? ?Labs, xray  ?  ?Tedd Sias, [place] ?07/07/21 1811 ? ?

## 2021-07-23 NOTE — Pre-Procedure Instructions (Signed)
Surgical Instructions    Your procedure is scheduled on Tuesday, June 13th.  Report to [facility] Main Entrance "A" at 11:45 A.M., then check in with the Admitting office.  Call this number if you have problems the morning of surgery:  [phone]   If you have any questions prior to your surgery date call [phone]: Open Monday-Friday 8am-4pm    Remember:  Do not eat after midnight the night before your surgery  You may drink clear liquids until 10:45 AM the morning of your surgery.   Clear liquids allowed are: Water, Non-Citrus Juices (without pulp), Carbonated Beverages, Clear Tea, Black Coffee Only (NO MILK, CREAM OR POWDERED CREAMER of any kind), and Gatorade.    Take these medicines the morning of surgery with A SIP OF WATER  norethindrone (AYGESTIN)  As of today, STOP taking any Aspirin (unless otherwise instructed by your surgeon) Aleve, Naproxen, Ibuprofen, Motrin, Advil, Goody's, BC's, all herbal medications, fish oil, and all vitamins.                     Do NOT Smoke (Tobacco/Vaping) for 24 hours prior to your procedure.  If you use a CPAP at night, you may bring your mask/headgear for your overnight stay.   Contacts, glasses, piercing's, hearing aid's, dentures or partials may not be worn into surgery, please bring cases for these belongings.    For patients admitted to the hospital, discharge time will be determined by your treatment team.   Patients discharged the day of surgery will not be allowed to drive home, and someone needs to stay with them for 24 hours.  SURGICAL WAITING ROOM VISITATION Patients having surgery or a procedure may have two support people in the waiting room. These visitors may be switched out with other visitors if needed. Children under the age of 92 must have an adult accompany them who is not the patient. If the patient needs to stay at the hospital during part of their recovery, the visitor guidelines for inpatient rooms  apply.  Please refer to the [facility] website for the visitor guidelines for Inpatients (after your surgery is over and you are in a regular room).    Special instructions:   [facility]- Preparing For Surgery  Before surgery, you can play an important role. Because skin is not sterile, your skin needs to be as free of germs as possible. You can reduce the number of germs on your skin by washing with CHG (chlorahexidine gluconate) Soap before surgery.  CHG is an antiseptic cleaner which kills germs and bonds with the skin to continue killing germs even after washing.    Oral Hygiene is also important to reduce your risk of infection.  Remember - BRUSH YOUR TEETH THE MORNING OF SURGERY WITH YOUR REGULAR TOOTHPASTE  Please do not use if you have an allergy to CHG or antibacterial soaps. If your skin becomes reddened/irritated stop using the CHG.  Do not shave (including legs and underarms) for at least 48 hours prior to first CHG shower. It is OK to shave your face.  Please follow these instructions carefully.   Shower the NIGHT BEFORE SURGERY and the MORNING OF SURGERY  If you chose to wash your hair, wash your hair first as usual with your normal shampoo.  After you shampoo, rinse your hair and body thoroughly to remove the shampoo.  Use CHG Soap as you would any other liquid soap. You can apply CHG directly to the skin and wash  gently with a scrungie or a clean washcloth.   Apply the CHG Soap to your body ONLY FROM THE NECK DOWN.  Do not use on open wounds or open sores. Avoid contact with your eyes, ears, mouth and genitals (private parts). Wash Face and genitals (private parts)  with your normal soap.   Wash thoroughly, paying special attention to the area where your surgery will be performed.  Thoroughly rinse your body with warm water from the neck down.  DO NOT shower/wash with your normal soap after using and rinsing off the CHG Soap.  Pat yourself dry with a CLEAN  TOWEL.  Wear CLEAN PAJAMAS to bed the night before surgery  Place CLEAN SHEETS on your bed the night before your surgery  DO NOT SLEEP WITH PETS.   Day of Surgery: Take a shower with CHG soap. Do not wear jewelry or makeup Do not wear lotions, powders, perfumes, or deodorant. Do not shave 48 hours prior to surgery.   Do not bring valuables to the hospital.  [facility] is not responsible for any belongings or valuables. Do not wear nail polish, gel polish, artificial nails, or any other type of covering on natural nails (fingers and toes) If you have artificial nails or gel coating that need to be removed by a nail salon, please have this removed prior to surgery. Artificial nails or gel coating may interfere with anesthesia's ability to adequately monitor your vital signs. Wear Clean/Comfortable clothing the morning of surgery Remember to brush your teeth WITH YOUR REGULAR TOOTHPASTE.   Please read over the following fact sheets that you were given.    If you received a COVID test during your pre-op visit  it is requested that you wear a mask when out in public, stay away from anyone that may not be feeling well and notify your surgeon if you develop symptoms. If you have been in contact with anyone that has tested positive in the last 10 days please notify you surgeon.

## 2021-07-25 NOTE — H&P (Addendum)
Sally Garner is an 36 y.o. female with uterine fibroids and [facility]MC. Desires definite therapy.  GYN U/S, uterine fibroids, vol 239   H/O LTCS x 2  H/O BTL  Menstrual History: Menarche age: 54 Patient's last menstrual period was 06/27/2021.    Past Medical History:  Diagnosis Date   Hypertension    was put on medication end of 03/2015; states has caused headache and is not taking as prescribed   Migraines    Status post repeat low transverse cesarean section 07/23/2017   Please schedule this patient for Postpartum visit in: 6 weeks with the following provider: Any provider For C/S patients schedule nurse incision check in weeks 2 weeks: yes High risk pregnancy complicated by: HTN Delivery mode:  CS Anticipated Birth Control:  BTL done PP PP Procedures needed: Incision check, BP check in 1 week Schedule Integrated [place] visit: no    Status post tubal ligation 07/23/2017   Symptomatic cholelithiasis 04/2015    Past Surgical History:  Procedure Laterality Date   CESAREAN SECTION N/A 05/25/2013   Procedure: CESAREAN SECTION;  Surgeon: Annalee Genta, DO;  Location: [facility];  Service: Obstetrics;  Laterality: N/A;   CESAREAN SECTION N/A 07/23/2017   Procedure: CESAREAN SECTION;  Surgeon: Donnamae Jude, MD;  Location: [place];  Service: Obstetrics;  Laterality: N/A;   CHOLECYSTECTOMY N/A 05/05/2015   Procedure: LAPAROSCOPIC CHOLECYSTECTOMY;  Surgeon: Coralie Keens, MD;  Location: [place];  Service: General;  Laterality: N/A;    Family History  Problem Relation Age of Onset   Birth defects Brother        polydactyl   Hypertension Maternal Aunt    Hypertension Maternal Uncle    Cancer Paternal Aunt        breast cancer    Social History:  reports that she has been smoking cigarettes. She has a 1.50 pack-year smoking history. She has never used smokeless tobacco. She reports that she does not currently use alcohol. She reports that she does not use  drugs.  Allergies: No Known Allergies  No medications prior to admission.    Review of Systems  Constitutional: Negative.   Respiratory: Negative.    Cardiovascular: Negative.   Gastrointestinal: Negative.   Genitourinary:  Positive for menstrual problem.   Last menstrual period 06/27/2021. Physical Exam Constitutional:      Appearance: Normal appearance.  Cardiovascular:     Rate and Rhythm: Normal rate and regular rhythm.  Pulmonary:     Effort: Pulmonary effort is normal.     Breath sounds: Normal breath sounds.  Abdominal:     General: Bowel sounds are normal.     Palpations: Abdomen is soft.  Genitourinary:    Comments: Nl EGBUS, uterus small, < 10 weeks, mobile, no masses Neurological:     Mental Status: She is alert.    No results found for this or any previous visit (from the past 24 hour(s)).  No results found.  Assessment/Plan: Uterine fibroids [facility]  TVH with possible salpingectomy reviewed with pt. R/B/Post op care reviewed with pt. Pt has verbalized understanding and desires to proceed.   Chancy Milroy 07/25/2021, 9:58 PM

## 2021-07-26 ENCOUNTER — Encounter (HOSPITAL_COMMUNITY)
Admission: RE | Admit: 2021-07-26 | Discharge: 2021-07-26 | Disposition: A | Discharge status: Home / Self Care | Payer: Medicaid Other | Source: Ambulatory Visit | Attending: Obstetrics and Gynecology | Admitting: Obstetrics and Gynecology

## 2021-07-26 ENCOUNTER — Other Ambulatory Visit: Payer: Self-pay

## 2021-07-26 ENCOUNTER — Encounter (HOSPITAL_COMMUNITY): Payer: Self-pay

## 2021-07-26 DIAGNOSIS — I10 Essential (primary) hypertension: Secondary | ICD-10-CM | POA: Diagnosis not present

## 2021-07-26 DIAGNOSIS — G43909 Migraine, unspecified, not intractable, without status migrainosus: Secondary | ICD-10-CM | POA: Insufficient documentation

## 2021-07-26 DIAGNOSIS — Z01812 Encounter for preprocedural laboratory examination: Secondary | ICD-10-CM | POA: Diagnosis not present

## 2021-07-26 DIAGNOSIS — D259 Leiomyoma of uterus, unspecified: Secondary | ICD-10-CM | POA: Diagnosis not present

## 2021-07-26 DIAGNOSIS — R7303 Prediabetes: Secondary | ICD-10-CM | POA: Diagnosis not present

## 2021-07-26 DIAGNOSIS — Z9049 Acquired absence of other specified parts of digestive tract: Secondary | ICD-10-CM | POA: Diagnosis not present

## 2021-07-26 DIAGNOSIS — Z6841 Body Mass Index (BMI) 40.0 and over, adult: Secondary | ICD-10-CM | POA: Diagnosis not present

## 2021-07-26 DIAGNOSIS — D649 Anemia, unspecified: Secondary | ICD-10-CM | POA: Diagnosis not present

## 2021-07-26 DIAGNOSIS — Z87891 Personal history of nicotine dependence: Secondary | ICD-10-CM | POA: Diagnosis not present

## 2021-07-26 HISTORY — DX: Prediabetes: R73.03

## 2021-07-26 LAB — CBC
HCT: 35.9 % — ABNORMAL LOW (ref 36.0–46.0)
Hemoglobin: 11.5 g/dL — ABNORMAL LOW (ref 12.0–15.0)
MCH: 25.7 pg — ABNORMAL LOW (ref 26.0–34.0)
MCHC: 32 g/dL (ref 30.0–36.0)
MCV: 80.1 fL (ref 80.0–100.0)
Platelets: 353 10*3/uL (ref 150–400)
RBC: 4.48 MIL/uL (ref 3.87–5.11)
RDW: 14.6 % (ref 11.5–15.5)
WBC: 8 10*3/uL (ref 4.0–10.5)
nRBC: 0 % (ref 0.0–0.2)

## 2021-07-26 LAB — BASIC METABOLIC PANEL
Anion gap: 6 (ref 5–15)
BUN: 5 mg/dL — ABNORMAL LOW (ref 6–20)
CO2: 23 mmol/L (ref 22–32)
Calcium: 9.3 mg/dL (ref 8.9–10.3)
Chloride: 104 mmol/L (ref 98–111)
Creatinine, Ser: 0.81 mg/dL (ref 0.44–1.00)
GFR, Estimated: >60 mL/min (ref >60)
Glucose, Bld: 94 mg/dL (ref 70–99)
Potassium: 3.2 mmol/L — ABNORMAL LOW (ref 3.5–5.1)
Sodium: 133 mmol/L — ABNORMAL LOW (ref 135–145)

## 2021-07-26 LAB — TYPE AND SCREEN
ABO/RH(D): O POS
Antibody Screen: NEGATIVE

## 2021-07-26 NOTE — Progress Notes (Signed)
PCP - Dr. Natasha Bence Cardiologist - denies  PPM/ICD - n/a Device Orders - n/a Rep Notified - n/a  Chest x-ray - 07/07/21 EKG - 07/08/21 Stress Test - denies ECHO - 07/18/13 Cardiac Cath - denies  Sleep Study - denies CPAP - n/a  Fasting Blood Sugar - Pre-diabetes per patient  Blood Thinner Instructions: n/a Aspirin Instructions: n/a  ERAS Protcol - Clear liquids until 10:45 A.M. day of surgery PRE-SURGERY Ensure or G2- None ordered  COVID TEST- n/a   Anesthesia review: Yes. Seen in the ED 07/07/21 for chest pain and SOB after lifting a heavy object at work. Heart rate 110 upon arrival to PAT. Patient states she used the stairs and got lost. Denies any current SOB or chest pain. States she has been fine since ED visit. Heart rate 106 when rechecked at end of PAT visit.   Patient denies shortness of breath, fever, cough and chest pain at PAT appointment   All instructions explained to the patient, with a verbal understanding of the material. Patient agrees to go over the instructions while at home for a better understanding. The opportunity to ask questions was provided.

## 2021-07-27 NOTE — Anesthesia Preprocedure Evaluation (Addendum)
Anesthesia Evaluation  Patient identified by MRN, date of birth, ID band Patient awake    Reviewed: Allergy & Precautions, NPO status , Patient's Chart, lab work & pertinent test results  Airway Mallampati: II  TM Distance: >3 FB Neck ROM: Full    Dental  (+) Dental Advisory Given, Teeth Intact   Pulmonary Current Smoker and Patient abstained from smoking.,  2/3ppd x 8 years Albuterol inhaler only when sick- once or twice per year   Pulmonary exam normal breath sounds clear to auscultation       Cardiovascular hypertension (143/94 in preop, per pt normally 140s/100s), Pt. on medications Normal cardiovascular exam Rhythm:Regular Rate:Normal  Echo 2015 - LVEF 55-60%, normal wall motion, mild diastolic dysfunction,indeterminate LV filling pressure.    Neuro/Psych  Headaches, negative psych ROS   GI/Hepatic negative GI ROS, Neg liver ROS,   Endo/Other  Morbid obesityBMI 41  Renal/GU negative Renal ROS  negative genitourinary   Musculoskeletal negative musculoskeletal ROS (+)   Abdominal (+) + obese,   Peds negative pediatric ROS (+)  Hematology Hb 11.5, plt 353   Anesthesia Other Findings   Reproductive/Obstetrics negative OB ROS                           Anesthesia Physical Anesthesia Plan  ASA: 3  Anesthesia Plan: General   Post-op Pain Management: Tylenol PO (pre-op)* and Toradol IV (intra-op)*   Induction: Intravenous  PONV Risk Score and Plan: 2 and Ondansetron, Dexamethasone, Midazolam and Treatment may vary due to age or medical condition  Airway Management Planned: Oral ETT  Additional Equipment: None  Intra-op Plan:   Post-operative Plan: Extubation in OR  Informed Consent: I have reviewed the patients History and Physical, chart, labs and discussed the procedure including the risks, benefits and alternatives for the proposed anesthesia with the patient or authorized  representative who has indicated his/her understanding and acceptance.     Dental advisory given  Plan Discussed with: CRNA  Anesthesia Plan Comments: ( )       Anesthesia Quick Evaluation

## 2021-07-27 NOTE — Progress Notes (Signed)
Anesthesia Chart Review:  Case: [id number] Date/Time: 08/03/21 1330   Procedure: HYSTERECTOMY VAGINAL WITH POSSIBLE SALPINGECTOMY (Bilateral)   Anesthesia type: Choice   Pre-op diagnosis: Fibroids   Location: [place] [facility] ROOM 07 / [place] [facility]   Surgeons: Chancy Milroy, MD       DISCUSSION: Patient is a 36 year old female scheduled for the above procedure.  History includes smoking, HTN, pre-diabetes, anemia, migraines, cholecystectomy (05/05/15). BMI is consistent with morbid obesity.  Multiple ED visits within the past six months including:  - 05/18/21 for migraine. Left without being seen.  - 06/01/21 for viral URI with cough and left OM - 06/04/21 for facial pain following fall. Sustained 1 inch superficial laceration involving the internal surface of the left upper lip and no repair felt indicated.  - 07/07/21 for nasal congestion, SOB, cough, chest pain (while lifting, resolved prior to ED). Reported kids at home with viral URI. Positive wheezing on exam. O2 sat 100%, RR 16, T 98.2, BP 131/104, HR 97 bpm. Negative respiratory panel. EKG SR at 100 bpm. Normal WBC and Troponin x2. Treated with albuterol HFA and symptoms felt related to viral URI, and discharged home on Mucinex and Tessalon.   She denied chest pain and SOB. She is for urine pregnancy test on arrival.  Anesthesia team to evaluate on the day of surgery.   VS: BP (!) 124/94   Pulse (!) 110   Temp 36.7 C (Oral)   Resp 17   Ht '5\' 4"'$  (1.626 m)   Wt 108.2 kg   LMP 06/27/2021   SpO2 100%   BMI 40.96 kg/m  HR 110 on arrival to PAT. Reported she had taken the stairs after getting lost trying to find PAT. HR 106 on recheck. TSH normal 05/18/21. SR at 100 bpm on EKG 07/07/21. She denied chest pain and SOB.   PROVIDERS: Dorna Mai, MD is PCP. Last visit 04/05/21 for annual exam. BP 125/81.    LABS: Labs reviewed: Acceptable for surgery. A1c 6.1% and TSH 0.839 05/18/21.  (all labs ordered are listed, but only abnormal results are  displayed)  Labs Reviewed  CBC - Abnormal; Notable for the following components:      Result Value   Hemoglobin 11.5 (*)    HCT 35.9 (*)    MCH 25.7 (*)    All other components within normal limits  BASIC METABOLIC PANEL - Abnormal; Notable for the following components:   Sodium 133 (*)    Potassium 3.2 (*)    BUN 5 (*)    All other components within normal limits  TYPE AND SCREEN     IMAGES: CXR 07/07/21: FINDINGS: The heart size and mediastinal contours are within normal limits. Both lungs are clear. The visualized skeletal structures are unremarkable. IMPRESSION: No active cardiopulmonary disease.  US transabdominal/transvaginal 05/31/21: IMPRESSION: - Two uterine leiomyomata, larger of which 6.1 cm diameter is located submucosal at fundus. - Unremarkable endometrial complex and ovaries.   EKG: 07/07/21: NSR at 100 bpm   CV: Echo 07/18/13: Study Conclusions  - Left ventricle: The cavity size was normal. Wall thickness was    normal. Systolic function was normal. The estimated ejection    fraction was in the range of 55% to 60%. Wall motion was normal;    there were no regional wall motion abnormalities. Doppler    parameters are consistent with abnormal left ventricular    relaxation (grade 1 diastolic dysfunction).  - Left atrium: LA Volume/BSA= 29.2 ml/m2. The atrium was normal  in    size.  - Right atrium: The atrium was mildly dilated.   Impressions: - LVEF 55-60%, normal wall motion, mild diastolic dysfunction,    indeterminate LV filling pressure.    Past Medical History:  Diagnosis Date   Anemia    Hypertension    was put on medication end of 03/2015; states has caused headache and is not taking as prescribed   Migraines    Pre-diabetes    Status post repeat low transverse cesarean section 07/23/2017   Please schedule this patient for Postpartum visit in: 6 weeks with the following provider: Any provider For C/S patients schedule nurse incision check  in weeks 2 weeks: yes High risk pregnancy complicated by: HTN Delivery mode:  CS Anticipated Birth Control:  BTL done PP PP Procedures needed: Incision check, BP check in 1 week Schedule Integrated [place] visit: no    Status post tubal ligation 07/23/2017   Symptomatic cholelithiasis 04/2015    Past Surgical History:  Procedure Laterality Date   CESAREAN SECTION N/A 05/25/2013   Procedure: CESAREAN SECTION;  Surgeon: Annalee Genta, DO;  Location: [facility];  Service: Obstetrics;  Laterality: N/A;   CESAREAN SECTION N/A 07/23/2017   Procedure: CESAREAN SECTION;  Surgeon: Donnamae Jude, MD;  Location: [place];  Service: Obstetrics;  Laterality: N/A;   CHOLECYSTECTOMY N/A 05/05/2015   Procedure: LAPAROSCOPIC CHOLECYSTECTOMY;  Surgeon: Coralie Keens, MD;  Location: [place];  Service: General;  Laterality: N/A;    MEDICATIONS:  benzonatate (TESSALON) 100 MG capsule   chlorhexidine (PERIDEX) 0.12 % solution   guaiFENesin (MUCINEX) 600 MG 12 hr tablet   hydrochlorothiazide (HYDRODIURIL) 25 MG tablet   ibuprofen (ADVIL) 800 MG tablet   lidocaine (XYLOCAINE) 2 % solution   norethindrone (AYGESTIN) 5 MG tablet   ondansetron (ZOFRAN) 4 MG tablet   Prenat-Fe Poly-Methfol-FA-DHA (VITAFOL FE+) 90-0.6-0.4-200 MG CAPS   triamcinolone cream (KENALOG) 0.1 %   No current facility-administered medications for this encounter.    Myra Gianotti, PA-C Surgical Short Stay/Anesthesiology [facility] Phone [phone] [facility] Phone [phone] 07/27/2021 2:15 PM

## 2021-08-03 ENCOUNTER — Encounter (HOSPITAL_COMMUNITY): Payer: Self-pay | Admitting: Obstetrics and Gynecology

## 2021-08-03 ENCOUNTER — Observation Stay (HOSPITAL_BASED_OUTPATIENT_CLINIC_OR_DEPARTMENT_OTHER): Payer: Medicaid Other | Admitting: Anesthesiology

## 2021-08-03 ENCOUNTER — Encounter (HOSPITAL_COMMUNITY)
Admission: RE | Disposition: A | Discharge status: Home / Self Care | Payer: Self-pay | Source: Home / Self Care | Attending: Obstetrics and Gynecology

## 2021-08-03 ENCOUNTER — Other Ambulatory Visit: Payer: Self-pay

## 2021-08-03 ENCOUNTER — Observation Stay (HOSPITAL_COMMUNITY)
Admission: RE | Admit: 2021-08-03 | Discharge: 2021-08-04 | Disposition: A | Discharge status: Home / Self Care | Payer: Medicaid Other | Attending: Obstetrics and Gynecology | Admitting: Obstetrics and Gynecology

## 2021-08-03 ENCOUNTER — Observation Stay (HOSPITAL_COMMUNITY): Payer: Medicaid Other | Admitting: Vascular Surgery

## 2021-08-03 DIAGNOSIS — D259 Leiomyoma of uterus, unspecified: Secondary | ICD-10-CM | POA: Diagnosis not present

## 2021-08-03 DIAGNOSIS — N858 Other specified noninflammatory disorders of uterus: Secondary | ICD-10-CM | POA: Diagnosis not present

## 2021-08-03 DIAGNOSIS — Z6841 Body Mass Index (BMI) 40.0 and over, adult: Secondary | ICD-10-CM

## 2021-08-03 DIAGNOSIS — I10 Essential (primary) hypertension: Secondary | ICD-10-CM | POA: Diagnosis not present

## 2021-08-03 DIAGNOSIS — F17210 Nicotine dependence, cigarettes, uncomplicated: Secondary | ICD-10-CM | POA: Diagnosis not present

## 2021-08-03 DIAGNOSIS — D250 Submucous leiomyoma of uterus: Secondary | ICD-10-CM | POA: Diagnosis not present

## 2021-08-03 DIAGNOSIS — N920 Excessive and frequent menstruation with regular cycle: Secondary | ICD-10-CM

## 2021-08-03 DIAGNOSIS — Z9889 Other specified postprocedural states: Secondary | ICD-10-CM

## 2021-08-03 HISTORY — PX: VAGINAL HYSTERECTOMY: SHX2639

## 2021-08-03 LAB — POCT PREGNANCY, URINE: Preg Test, Ur: NEGATIVE

## 2021-08-03 SURGERY — HYSTERECTOMY, VAGINAL
Anesthesia: General | Site: Vagina | Laterality: Bilateral

## 2021-08-03 MED ORDER — OXYCODONE-ACETAMINOPHEN 5-325 MG PO TABS: 2.0000 | ORAL_TABLET | ORAL | Status: DC | PRN

## 2021-08-03 MED ORDER — ROCURONIUM BROMIDE 10 MG/ML (PF) SYRINGE
PREFILLED_SYRINGE | INTRAVENOUS | Status: AC
  Filled 2021-08-03: qty 10

## 2021-08-03 MED ORDER — ONDANSETRON HCL 4 MG/2ML IJ SOLN
INTRAMUSCULAR | Status: AC
Start: 2021-08-03 — End: ?
  Filled 2021-08-03: qty 2

## 2021-08-03 MED ORDER — IBUPROFEN 800 MG PO TABS
800.0000 mg | ORAL_TABLET | Freq: Three times a day (TID) | ORAL | Status: DC
  Administered 2021-08-03 – 2021-08-04 (×2): 800 mg via ORAL
  Filled 2021-08-03 (×2): qty 1

## 2021-08-03 MED ORDER — DEXAMETHASONE SODIUM PHOSPHATE 10 MG/ML IJ SOLN
INTRAMUSCULAR | Status: DC | PRN
  Administered 2021-08-03: 10 mg via INTRAVENOUS

## 2021-08-03 MED ORDER — MIDAZOLAM HCL 2 MG/2ML IJ SOLN
INTRAMUSCULAR | Status: DC | PRN
  Administered 2021-08-03: 2 mg via INTRAVENOUS

## 2021-08-03 MED ORDER — MIDAZOLAM HCL 2 MG/2ML IJ SOLN
INTRAMUSCULAR | Status: AC
  Filled 2021-08-03: qty 2

## 2021-08-03 MED ORDER — HYDROCHLOROTHIAZIDE 25 MG PO TABS
25.0000 mg | ORAL_TABLET | Freq: Every day | ORAL | Status: DC
  Administered 2021-08-03 – 2021-08-04 (×2): 25 mg via ORAL
  Filled 2021-08-03 (×2): qty 1

## 2021-08-03 MED ORDER — ONDANSETRON HCL 4 MG/2ML IJ SOLN: 4.0000 mg | Freq: Four times a day (QID) | INTRAMUSCULAR | Status: DC | PRN

## 2021-08-03 MED ORDER — POVIDONE-IODINE 10 % EX SWAB
2.0000 "application " | Freq: Once | CUTANEOUS | Status: AC
  Administered 2021-08-03: 2 via TOPICAL

## 2021-08-03 MED ORDER — OXYCODONE HCL 5 MG PO TABS: 5.0000 mg | ORAL_TABLET | Freq: Once | ORAL | Status: DC | PRN

## 2021-08-03 MED ORDER — MEPERIDINE HCL 25 MG/ML IJ SOLN: 6.2500 mg | INTRAMUSCULAR | Status: DC | PRN

## 2021-08-03 MED ORDER — ONDANSETRON HCL 4 MG PO TABS: 4.0000 mg | ORAL_TABLET | Freq: Four times a day (QID) | ORAL | Status: DC | PRN

## 2021-08-03 MED ORDER — FENTANYL CITRATE (PF) 250 MCG/5ML IJ SOLN
INTRAMUSCULAR | Status: AC
  Filled 2021-08-03: qty 5

## 2021-08-03 MED ORDER — AMISULPRIDE (ANTIEMETIC) 5 MG/2ML IV SOLN: 10.0000 mg | Freq: Once | INTRAVENOUS | Status: DC | PRN

## 2021-08-03 MED ORDER — OXYCODONE-ACETAMINOPHEN 5-325 MG PO TABS: 1.0000 | ORAL_TABLET | ORAL | Status: DC | PRN

## 2021-08-03 MED ORDER — KETOROLAC TROMETHAMINE 30 MG/ML IJ SOLN
INTRAMUSCULAR | Status: AC
  Filled 2021-08-03: qty 1

## 2021-08-03 MED ORDER — HYDROMORPHONE HCL 1 MG/ML IJ SOLN
0.2500 mg | INTRAMUSCULAR | Status: DC | PRN
  Administered 2021-08-03 (×2): 0.5 mg via INTRAVENOUS

## 2021-08-03 MED ORDER — SIMETHICONE 80 MG PO CHEW: 80.0000 mg | CHEWABLE_TABLET | Freq: Four times a day (QID) | ORAL | Status: DC | PRN

## 2021-08-03 MED ORDER — PROPOFOL 500 MG/50ML IV EMUL
INTRAVENOUS | Status: DC | PRN
  Administered 2021-08-03: 25 ug/kg/min via INTRAVENOUS

## 2021-08-03 MED ORDER — KETOROLAC TROMETHAMINE 30 MG/ML IJ SOLN
30.0000 mg | Freq: Four times a day (QID) | INTRAMUSCULAR | Status: DC
  Administered 2021-08-03 – 2021-08-04 (×2): 30 mg via INTRAVENOUS
  Filled 2021-08-03 (×2): qty 1

## 2021-08-03 MED ORDER — 0.9 % SODIUM CHLORIDE (POUR BTL) OPTIME
TOPICAL | Status: DC | PRN
  Administered 2021-08-03: 1000 mL

## 2021-08-03 MED ORDER — ONDANSETRON HCL 4 MG/2ML IJ SOLN: 4.0000 mg | Freq: Once | INTRAMUSCULAR | Status: DC | PRN

## 2021-08-03 MED ORDER — ZOLPIDEM TARTRATE 5 MG PO TABS: 5.0000 mg | ORAL_TABLET | Freq: Every evening | ORAL | Status: DC | PRN

## 2021-08-03 MED ORDER — PROPOFOL 10 MG/ML IV BOLUS
INTRAVENOUS | Status: DC | PRN
  Administered 2021-08-03: 160 mg via INTRAVENOUS

## 2021-08-03 MED ORDER — HYDROMORPHONE HCL 1 MG/ML IJ SOLN
1.0000 mg | INTRAMUSCULAR | Status: DC | PRN
  Administered 2021-08-03 – 2021-08-04 (×3): 1 mg via INTRAVENOUS
  Filled 2021-08-03 (×3): qty 1

## 2021-08-03 MED ORDER — DEXAMETHASONE SODIUM PHOSPHATE 10 MG/ML IJ SOLN
INTRAMUSCULAR | Status: AC
  Filled 2021-08-03: qty 1

## 2021-08-03 MED ORDER — PHENYLEPHRINE HCL-NACL 20-0.9 MG/250ML-% IV SOLN
INTRAVENOUS | Status: DC | PRN
  Administered 2021-08-03: 25 ug/min via INTRAVENOUS

## 2021-08-03 MED ORDER — ROCURONIUM BROMIDE 10 MG/ML (PF) SYRINGE
PREFILLED_SYRINGE | INTRAVENOUS | Status: DC | PRN
  Administered 2021-08-03: 80 mg via INTRAVENOUS

## 2021-08-03 MED ORDER — SOD CITRATE-CITRIC ACID 500-334 MG/5ML PO SOLN
30.0000 mL | ORAL | Status: AC
  Administered 2021-08-03: 30 mL via ORAL
  Filled 2021-08-03: qty 30

## 2021-08-03 MED ORDER — LIDOCAINE 2% (20 MG/ML) 5 ML SYRINGE
INTRAMUSCULAR | Status: AC
  Filled 2021-08-03: qty 5

## 2021-08-03 MED ORDER — ONDANSETRON HCL 4 MG/2ML IJ SOLN
INTRAMUSCULAR | Status: DC | PRN
  Administered 2021-08-03: 8 mg via INTRAVENOUS

## 2021-08-03 MED ORDER — CEFAZOLIN SODIUM-DEXTROSE 2-4 GM/100ML-% IV SOLN
2.0000 g | INTRAVENOUS | Status: AC
  Administered 2021-08-03: 2 g via INTRAVENOUS
  Filled 2021-08-03: qty 100

## 2021-08-03 MED ORDER — LIDOCAINE 2% (20 MG/ML) 5 ML SYRINGE
INTRAMUSCULAR | Status: DC | PRN
  Administered 2021-08-03: 60 mg via INTRAVENOUS

## 2021-08-03 MED ORDER — KETOROLAC TROMETHAMINE 15 MG/ML IJ SOLN
15.0000 mg | INTRAMUSCULAR | Status: AC
  Administered 2021-08-03: 15 mg via INTRAVENOUS
  Filled 2021-08-03: qty 1

## 2021-08-03 MED ORDER — SUGAMMADEX SODIUM 200 MG/2ML IV SOLN
INTRAVENOUS | Status: DC | PRN
  Administered 2021-08-03: 432 mg via INTRAVENOUS

## 2021-08-03 MED ORDER — PROPOFOL 10 MG/ML IV BOLUS
INTRAVENOUS | Status: AC
  Filled 2021-08-03: qty 20

## 2021-08-03 MED ORDER — DIPHENHYDRAMINE HCL 50 MG/ML IJ SOLN
INTRAMUSCULAR | Status: DC | PRN
  Administered 2021-08-03: 12.5 mg via INTRAVENOUS

## 2021-08-03 MED ORDER — LACTATED RINGERS IV SOLN: INTRAVENOUS | Status: DC

## 2021-08-03 MED ORDER — CHLORHEXIDINE GLUCONATE 0.12 % MT SOLN
OROMUCOSAL | Status: AC
  Administered 2021-08-03: 15 mL via OROMUCOSAL
  Filled 2021-08-03: qty 15

## 2021-08-03 MED ORDER — FENTANYL CITRATE (PF) 250 MCG/5ML IJ SOLN
INTRAMUSCULAR | Status: DC | PRN
  Administered 2021-08-03: 100 ug via INTRAVENOUS
  Administered 2021-08-03 (×3): 50 ug via INTRAVENOUS
  Administered 2021-08-03: 150 ug via INTRAVENOUS

## 2021-08-03 MED ORDER — HYDROMORPHONE HCL 1 MG/ML IJ SOLN
INTRAMUSCULAR | Status: AC
  Filled 2021-08-03: qty 1

## 2021-08-03 MED ORDER — ORAL CARE MOUTH RINSE: 15.0000 mL | Freq: Once | OROMUCOSAL | Status: AC

## 2021-08-03 MED ORDER — SENNA 8.6 MG PO TABS
1.0000 | ORAL_TABLET | Freq: Two times a day (BID) | ORAL | Status: DC
  Administered 2021-08-03 – 2021-08-04 (×2): 8.6 mg via ORAL
  Filled 2021-08-03 (×2): qty 1

## 2021-08-03 MED ORDER — CHLORHEXIDINE GLUCONATE 0.12 % MT SOLN: 15.0000 mL | Freq: Once | OROMUCOSAL | Status: AC

## 2021-08-03 MED ORDER — ACETAMINOPHEN 500 MG PO TABS
1000.0000 mg | ORAL_TABLET | ORAL | Status: AC
  Administered 2021-08-03: 1000 mg via ORAL
  Filled 2021-08-03: qty 2

## 2021-08-03 MED ORDER — KETOROLAC TROMETHAMINE 30 MG/ML IJ SOLN: 30.0000 mg | Freq: Once | INTRAMUSCULAR | Status: DC | PRN

## 2021-08-03 MED ORDER — OXYCODONE HCL 5 MG/5ML PO SOLN: 5.0000 mg | Freq: Once | ORAL | Status: DC | PRN

## 2021-08-03 MED ORDER — KETOROLAC TROMETHAMINE 15 MG/ML IJ SOLN
INTRAMUSCULAR | Status: DC | PRN
  Administered 2021-08-03: 15 mg via INTRAVENOUS

## 2021-08-03 MED ORDER — MENTHOL 3 MG MT LOZG
1.0000 | LOZENGE | OROMUCOSAL | Status: DC | PRN
Start: 2021-08-03 — End: 2021-08-04

## 2021-08-03 SURGICAL SUPPLY — 26 items
BAG COUNTER SPONGE SURGICOUNT (BAG) ×3 IMPLANT
BLADE SURG 10 STRL SS (BLADE) ×3 IMPLANT
GAUZE 4X4 16PLY ~~LOC~~+RFID DBL (SPONGE) ×4 IMPLANT
GLOVE BIO SURGEON STRL SZ7.5 (GLOVE) ×2 IMPLANT
GLOVE BIOGEL PI IND STRL 8 (GLOVE) ×1 IMPLANT
GLOVE BIOGEL PI INDICATOR 8 (GLOVE) ×1
GLOVE SURG ORTHO 8.0 STRL STRW (GLOVE) ×2 IMPLANT
GLOVE SURG UNDER POLY LF SZ7 (GLOVE) ×2 IMPLANT
GOWN STRL REUS W/ TWL LRG LVL3 (GOWN DISPOSABLE) ×1 IMPLANT
GOWN STRL REUS W/ TWL XL LVL3 (GOWN DISPOSABLE) ×2 IMPLANT
GOWN STRL REUS W/TWL LRG LVL3 (GOWN DISPOSABLE) ×8
GOWN STRL REUS W/TWL XL LVL3 (GOWN DISPOSABLE) ×4
HIBICLENS CHG 4% 4OZ BTL (MISCELLANEOUS) ×2 IMPLANT
KIT TURNOVER KIT B (KITS) ×2 IMPLANT
MANIFOLD NEPTUNE II (INSTRUMENTS) ×2 IMPLANT
NS IRRIG 1000ML POUR BTL (IV SOLUTION) ×2 IMPLANT
PACK VAGINAL WOMENS (CUSTOM PROCEDURE TRAY) ×2 IMPLANT
PAD OB MATERNITY 4.3X12.25 (PERSONAL CARE ITEMS) ×2 IMPLANT
SUT VIC AB 2-0 CT1 18 (SUTURE) ×2 IMPLANT
SUT VIC AB 2-0 CT1 27 (SUTURE) ×2
SUT VIC AB 2-0 CT1 TAPERPNT 27 (SUTURE) ×1 IMPLANT
SUT VIC AB PLUS 45CM 1-MO-4 (SUTURE) ×5 IMPLANT
SUT VICRYL 1 TIES 12X18 (SUTURE) ×2 IMPLANT
TOWEL GREEN STERILE FF (TOWEL DISPOSABLE) ×2 IMPLANT
TRAY FOLEY W/BAG SLVR 14FR (SET/KITS/TRAYS/PACK) ×2 IMPLANT
UNDERPAD 30X36 HEAVY ABSORB (UNDERPADS AND DIAPERS) ×2 IMPLANT

## 2021-08-03 NOTE — Transfer of Care (Signed)
Immediate Anesthesia Transfer of Care Note  Patient: Sally Garner  Procedure(s) Performed: HYSTERECTOMY VAGINAL WITH BILATERAL SALPINGECTOMY (Bilateral: Vagina )  Patient Location: PACU  Anesthesia Type:General  Level of Consciousness: awake  Airway & Oxygen Therapy: Patient Spontanous Breathing and Patient connected to nasal cannula oxygen  Post-op Assessment: Report given to RN and Post -op Vital signs reviewed and stable  Post vital signs: Reviewed and stable  Last Vitals:  Vitals Value Taken Time  BP 159/99 08/03/21 1725  Temp    Pulse 95 08/03/21 1735  Resp 24 08/03/21 1735  SpO2 100 % 08/03/21 1735  Vitals shown include unvalidated device data.  Last Pain:  Vitals:   08/03/21 1226  TempSrc:   PainSc: 0-No pain         Complications: No notable events documented.

## 2021-08-03 NOTE — Anesthesia Procedure Notes (Signed)
Procedure Name: Intubation Date/Time: 08/03/2021 3:24 PM  Performed by: Minerva Ends, CRNAPre-anesthesia Checklist: Patient identified, Emergency Drugs available, Suction available and Patient being monitored Patient Re-evaluated:Patient Re-evaluated prior to induction Oxygen Delivery Method: Circle system utilized Preoxygenation: Pre-oxygenation with 100% oxygen Induction Type: IV induction Ventilation: Mask ventilation without difficulty Laryngoscope Size: Mac and 3 Grade View: Grade II Tube type: Oral Tube size: 7.0 mm Number of attempts: 2 Airway Equipment and Method: Stylet and Oral airway Placement Confirmation: ETT inserted through vocal cords under direct vision, positive ETCO2 and breath sounds checked- equal and bilateral Secured at: 21 cm Tube secured with: Tape Dental Injury: Teeth and Oropharynx as per pre-operative assessment

## 2021-08-03 NOTE — Interval H&P Note (Signed)
History and Physical Interval Note:  08/03/2021 2:30 PM  Sally Garner  has presented today for surgery, with the diagnosis of FIBROIDS.  The various methods of treatment have been discussed with the patient and family. After consideration of risks, benefits and other options for treatment, the patient has consented to  Procedure(s): HYSTERECTOMY VAGINAL WITH POSSIBLE SALPINGECTOMY (Bilateral) as a surgical intervention.  The patient's history has been reviewed, patient examined, no change in status, stable for surgery.  I have reviewed the patient's chart and labs.  Questions were answered to the patient's satisfaction.     Chancy Milroy

## 2021-08-03 NOTE — Op Note (Signed)
Sally Garner PROCEDURE DATE: 08/03/2021  PREOPERATIVE DIAGNOSIS:  Symptomatic fibroids, menorrhagia POSTOPERATIVE DIAGNOSIS:  Symptomatic fibroids, menorrhagia SURGEON:   Arlina Robes, M.D. Terrence DupontLeland Johns, M.D.  An experienced assistant was required given the standard of surgical care given the complexity of the case.  This assistant was needed for exposure, dissection, suctioning, retraction, and for overall help during the procedure.   OPERATION:  Total Vaginal hysterectomy and bilateral salpingectomy, wt 294 gms ANESTHESIA:  General endotracheal.  INDICATIONS: The patient is a 36 y.o. [id number] with history of symptomatic uterine fibroids/menorrhagia. The patient made a decision to undergo definite surgical treatment. On the preoperative visit, the risks, benefits, indications, and alternatives of the procedure were reviewed with the patient.  On the day of surgery, the risks of surgery were again discussed with the patient including but not limited to: bleeding which may require transfusion or reoperation; infection which may require antibiotics; injury to bowel, bladder, ureters or other surrounding organs; need for additional procedures; thromboembolic phenomenon, incisional problems and other postoperative/anesthesia complications. Written informed consent was obtained.    OPERATIVE FINDINGS: A 12 week size uterus with normal tubes and ovaries bilaterally.  ESTIMATED BLOOD LOSS: 420 ml FLUIDS:  As recorded URINE OUTPUT:  As recorded SPECIMENS:  Uterus and cervix and tubes sent to pathology COMPLICATIONS:  None immediate.  DESCRIPTION OF PROCEDURE:  The patient received intravenous antibiotics and had sequential compression devices applied to her lower extremities while in the preoperative area.  She was then taken to the operating room where general anesthesia was administered and was found to be adequate.  She was placed in the dorsal lithotomy position, and was prepped and  draped in a sterile manner.  A Foley catheter was inserted into her bladder and attached to constant drainage. After an adequate timeout was performed, attention was turned to her pelvis.  A weighted speculum was then placed in the vagina, and the  posterior lip of the cervix was grasped  with tenaculum. The posterior cul de sac was entered sharply. Long bill weighted speculum was placed.   The anterior cervix was grasped and then circumferentially incised, and the bladder was dissected off the pubocervical fascia anteriorly without complication.  The anterior cul-de-sac was then entered sharply without difficulty and a retractor was placed.   Zeplin clamps were then used to clamp the uterosacral ligaments on either side.  They were then cut and sutured ligated with 0 Vicryl.  Of note, all sutures used in this case were 0 Vicryl unless otherwise noted.   The cardinal ligaments were then clamped, cut and ligated. The uterine vessels and broad ligaments were then serially clamped with the Zeplin clamps, cut, and suture ligated on both sides.  Excellent hemostasis was noted at this point.  The uterus was then morcellated in bivalve method. Two large fibroids were encounter and easily removed. This allowed visualization of the final pedicles involving the uteroovarian. Each was clamped, cut and ligated with a free tie and then in a [place] fashion. The uterus was passed off for pathology. Each fallopian tube was identified and clampes. Cut and ligated with a free tie. Tubes were passed off for pathology.  After completion of the hysterectomy, all pedicles from the uterosacral ligament to the cornua were examined hemostasis was confirmed. Peritoneum was the closed in a purse string fashion with 2/0 Vicryl. The vaginal cuff was then closed with 2/0 . All instruments were then removed from the pelvis.  The patient tolerated the procedure well.  All instruments, needles, and sponge counts were correct x 2. The patient was  taken to the recovery room in stable condition.    Arlina Robes MD, [place] Attending [place], [facility]

## 2021-08-04 ENCOUNTER — Encounter (HOSPITAL_COMMUNITY): Payer: Self-pay | Admitting: Obstetrics and Gynecology

## 2021-08-04 DIAGNOSIS — D259 Leiomyoma of uterus, unspecified: Secondary | ICD-10-CM | POA: Diagnosis not present

## 2021-08-04 LAB — BASIC METABOLIC PANEL
Anion gap: 6 (ref 5–15)
BUN: 5 mg/dL — ABNORMAL LOW (ref 6–20)
CO2: 25 mmol/L (ref 22–32)
Calcium: 8.4 mg/dL — ABNORMAL LOW (ref 8.9–10.3)
Chloride: 103 mmol/L (ref 98–111)
Creatinine, Ser: 0.89 mg/dL (ref 0.44–1.00)
GFR, Estimated: >60 mL/min (ref >60)
Glucose, Bld: 124 mg/dL — ABNORMAL HIGH (ref 70–99)
Potassium: 3.8 mmol/L (ref 3.5–5.1)
Sodium: 134 mmol/L — ABNORMAL LOW (ref 135–145)

## 2021-08-04 LAB — CBC
HCT: 32.1 % — ABNORMAL LOW (ref 36.0–46.0)
Hemoglobin: 10.3 g/dL — ABNORMAL LOW (ref 12.0–15.0)
MCH: 25.4 pg — ABNORMAL LOW (ref 26.0–34.0)
MCHC: 32.1 g/dL (ref 30.0–36.0)
MCV: 79.1 fL — ABNORMAL LOW (ref 80.0–100.0)
Platelets: 318 10*3/uL (ref 150–400)
RBC: 4.06 MIL/uL (ref 3.87–5.11)
RDW: 14.4 % (ref 11.5–15.5)
WBC: 11 10*3/uL — ABNORMAL HIGH (ref 4.0–10.5)
nRBC: 0 % (ref 0.0–0.2)

## 2021-08-04 MED ORDER — IBUPROFEN 800 MG PO TABS: 800.0000 mg | ORAL_TABLET | Freq: Three times a day (TID) | ORAL | 1 refills | Status: DC | PRN

## 2021-08-04 MED ORDER — OXYCODONE-ACETAMINOPHEN 5-325 MG PO TABS: 1.0000 | ORAL_TABLET | ORAL | 0 refills | Status: DC | PRN

## 2021-08-04 NOTE — Plan of Care (Signed)
  Problem: Education: Goal: Knowledge of General Education information will improve Description Including pain rating scale, medication(s)/side effects and non-pharmacologic comfort measures Outcome: Progressing   Problem: Health Behavior/Discharge Planning: Goal: Ability to manage health-related needs will improve Outcome: Progressing   

## 2021-08-04 NOTE — Progress Notes (Signed)
Patient voided

## 2021-08-04 NOTE — Discharge Summary (Signed)
Physician Discharge Summary  Patient ID: Sally Garner MRN: [id number] DOB/AGE: 1985/10/13 36 y.o.  Admit date: 08/03/2021 Discharge date: 08/04/2021  Admission Diagnoses: Uterine Fibroids  Discharge Diagnoses:  Principal Problem:   Post-operative state   Discharged Condition: good  Hospital Course: [name] was admitted with above Dx. She underwent TVH with BS without problems. See OP note for additional information. Post op course was unremarkable. She progressed to ambulating, voiding, tolerating diet and good oral pain control. Felt amendable for discharge home. Discharge medications, instructions and follow up reviewed with pt. Pt verbalized understanding.  Consults: None  Significant Diagnostic Studies: labs  Treatments: surgery: TVH/BS  Discharge Exam: Blood pressure (!) 151/97, pulse (!) 104, temperature 98.7 F (37.1 C), temperature source Oral, resp. rate 16, height '5\' 4"'$  (1.626 m), weight 108 kg, SpO2 100 %. Lungs clear Heart RRR Abd soft + BS GU no bleeding Ext non tender   Disposition: Discharge disposition: 01-Home or Self Care       Discharge Instructions     Call MD for:  difficulty breathing, headache or visual disturbances   Complete by: As directed    Call MD for:  extreme fatigue   Complete by: As directed    Call MD for:  hives   Complete by: As directed    Call MD for:  persistant dizziness or light-headedness   Complete by: As directed    Call MD for:  persistant nausea and vomiting   Complete by: As directed    Call MD for:  redness, tenderness, or signs of infection (pain, swelling, redness, odor or green/yellow discharge around incision site)   Complete by: As directed    Call MD for:  severe uncontrolled pain   Complete by: As directed    Call MD for:  temperature >100.4   Complete by: As directed    Diet - low sodium heart healthy   Complete by: As directed    Increase activity slowly   Complete by: As directed    Sexual  Activity Restrictions   Complete by: As directed    Pelvic rest x 4 weeks      Allergies as of 08/04/2021   No Known Allergies      Medication List     STOP taking these medications    benzonatate 100 MG capsule Commonly known as: TESSALON   chlorhexidine 0.12 % solution Commonly known as: PERIDEX   guaiFENesin 600 MG 12 hr tablet Commonly known as: Mucinex   lidocaine 2 % solution Commonly known as: XYLOCAINE   norethindrone 5 MG tablet Commonly known as: Aygestin   ondansetron 4 MG tablet Commonly known as: ZOFRAN   triamcinolone cream 0.1 % Commonly known as: KENALOG       TAKE these medications    hydrochlorothiazide 25 MG tablet Commonly known as: HYDRODIURIL Take 1 tablet (25 mg total) by mouth daily.   ibuprofen 800 MG tablet Commonly known as: ADVIL Take 1 tablet (800 mg total) by mouth every 8 (eight) hours as needed.   oxyCODONE-acetaminophen 5-325 MG tablet Commonly known as: PERCOCET/ROXICET Take 1 tablet by mouth every 4 (four) hours as needed for moderate pain.   Vitafol FE+ 90-0.6-0.4-200 MG Caps Take 1 capsule by mouth daily before breakfast.        Follow-up Information     [place]. Schedule an appointment as soon as possible for a visit in 4 week(s).   Why: face to face for post op appt with Dr. Jerilynn Mages.  Madelaine Whipple Contact information: [place] [address] [address] [phone]                Signed: Chancy Milroy 08/04/2021, 8:29 AM

## 2021-08-04 NOTE — Progress Notes (Signed)
Removed foley

## 2021-08-04 NOTE — Progress Notes (Signed)
Nsg Discharge Note  Admit Date:  08/03/2021 Discharge date: 08/04/2021   Carmin Muskrat to be D/C'd Home per MD order.  AVS completed.   Removed IV-CDI. Reviewed d/c paperwork with patient. Patient/caregiver able to verbalize understanding. Wheeled stable patient and belongings to main entrance where she was picked up by her "baby daddy" as she called him.  Discharge Medication: Allergies as of 08/04/2021   No Known Allergies      Medication List     STOP taking these medications    benzonatate 100 MG capsule Commonly known as: TESSALON   chlorhexidine 0.12 % solution Commonly known as: PERIDEX   guaiFENesin 600 MG 12 hr tablet Commonly known as: Mucinex   lidocaine 2 % solution Commonly known as: XYLOCAINE   norethindrone 5 MG tablet Commonly known as: Aygestin   ondansetron 4 MG tablet Commonly known as: ZOFRAN   triamcinolone cream 0.1 % Commonly known as: KENALOG       TAKE these medications    hydrochlorothiazide 25 MG tablet Commonly known as: HYDRODIURIL Take 1 tablet (25 mg total) by mouth daily.   ibuprofen 800 MG tablet Commonly known as: ADVIL Take 1 tablet (800 mg total) by mouth every 8 (eight) hours as needed.   oxyCODONE-acetaminophen 5-325 MG tablet Commonly known as: PERCOCET/ROXICET Take 1 tablet by mouth every 4 (four) hours as needed for moderate pain.   Vitafol FE+ 90-0.6-0.4-200 MG Caps Take 1 capsule by mouth daily before breakfast.        Discharge Assessment: Vitals:   08/04/21 0346 08/04/21 0721  BP: (!) 142/104 (!) 151/97  Pulse: (!) 104 (!) 104  Resp: 18 16  Temp: 98.6 F (37 C) 98.7 F (37.1 C)  SpO2: 98% 100%   Skin clean, dry and intact without evidence of skin break down, no evidence of skin tears noted. IV catheter discontinued intact. Site without signs and symptoms of complications - no redness or edema noted at insertion site, patient denies c/o pain - only slight tenderness at site.  Dressing with slight  pressure applied.  D/c Instructions-Education: Discharge instructions given to patient/family with verbalized understanding. D/c education completed with patient/family including follow up instructions, medication list, d/c activities limitations if indicated, with other d/c instructions as indicated by MD - patient able to verbalize understanding, all questions fully answered. Patient instructed to return to ED, call 911, or call MD for any changes in condition.  Patient escorted via [place], and D/C home via private auto.  Santa Lighter, RN 08/04/2021 9:53 AM

## 2021-08-04 NOTE — Plan of Care (Signed)
  Problem: Education: Goal: Knowledge of General Education information will improve Description: Including pain rating scale, medication(s)/side effects and non-pharmacologic comfort measures 08/04/2021 0845 by Santa Lighter, RN Outcome: Adequate for Discharge 08/04/2021 0844 by Santa Lighter, RN Outcome: Progressing   Problem: Health Behavior/Discharge Planning: Goal: Ability to manage health-related needs will improve 08/04/2021 0845 by Santa Lighter, RN Outcome: Adequate for Discharge 08/04/2021 0844 by Santa Lighter, RN Outcome: Progressing   Problem: Clinical Measurements: Goal: Ability to maintain clinical measurements within normal limits will improve Outcome: Adequate for Discharge Goal: Will remain free from infection Outcome: Adequate for Discharge Goal: Diagnostic test results will improve Outcome: Adequate for Discharge Goal: Respiratory complications will improve Outcome: Adequate for Discharge Goal: Cardiovascular complication will be avoided Outcome: Adequate for Discharge   Problem: Activity: Goal: Risk for activity intolerance will decrease Outcome: Adequate for Discharge   Problem: Nutrition: Goal: Adequate nutrition will be maintained Outcome: Adequate for Discharge   Problem: Coping: Goal: Level of anxiety will decrease Outcome: Adequate for Discharge   Problem: Elimination: Goal: Will not experience complications related to bowel motility Outcome: Adequate for Discharge Goal: Will not experience complications related to urinary retention Outcome: Adequate for Discharge   Problem: Pain Managment: Goal: General experience of comfort will improve Outcome: Adequate for Discharge   Problem: Safety: Goal: Ability to remain free from injury will improve Outcome: Adequate for Discharge   Problem: Skin Integrity: Goal: Risk for impaired skin integrity will decrease Outcome: Adequate for Discharge   Problem: Education: Goal: Required Educational  Video(s) Outcome: Adequate for Discharge   Problem: Clinical Measurements: Goal: Ability to maintain clinical measurements within normal limits will improve Outcome: Adequate for Discharge Goal: Postoperative complications will be avoided or minimized Outcome: Adequate for Discharge   Problem: Skin Integrity: Goal: Demonstration of wound healing without infection will improve Outcome: Adequate for Discharge

## 2021-08-04 NOTE — Anesthesia Postprocedure Evaluation (Signed)
Anesthesia Post Note  Patient: Sally Garner, Sally Garner  Procedure(s) Performed: HYSTERECTOMY VAGINAL WITH BILATERAL SALPINGECTOMY (Bilateral: Vagina )     Patient location during evaluation: PACU Anesthesia Type: General Level of consciousness: awake and alert Pain management: pain level controlled Vital Signs Assessment: post-procedure vital signs reviewed and stable Respiratory status: spontaneous breathing, nonlabored ventilation, respiratory function stable and patient connected to nasal cannula oxygen Cardiovascular status: blood pressure returned to baseline and stable Postop Assessment: no apparent nausea or vomiting Anesthetic complications: no   No notable events documented.  Last Vitals:  Vitals:   08/04/21 0346 08/04/21 0721  BP: (!) 142/104 (!) 151/97  Pulse: (!) 104 (!) 104  Resp: 18 16  Temp: 37 C 37.1 C  SpO2: 98% 100%    Last Pain:  Vitals:   08/04/21 0840  TempSrc:   PainSc: 3                  Sally Garner

## 2021-08-05 LAB — SURGICAL PATHOLOGY

## 2021-09-16 ENCOUNTER — Ambulatory Visit (INDEPENDENT_AMBULATORY_CARE_PROVIDER_SITE_OTHER): Payer: Medicaid Other | Admitting: Obstetrics and Gynecology

## 2021-09-16 ENCOUNTER — Other Ambulatory Visit: Payer: Self-pay | Admitting: Family Medicine

## 2021-09-16 ENCOUNTER — Encounter: Payer: Self-pay | Admitting: Obstetrics and Gynecology

## 2021-09-16 VITALS — BP 155/104 | HR 91 | Ht 64.0 in | Wt 241.6 lb

## 2021-09-16 DIAGNOSIS — Z9889 Other specified postprocedural states: Secondary | ICD-10-CM

## 2021-09-16 DIAGNOSIS — Z90710 Acquired absence of both cervix and uterus: Secondary | ICD-10-CM

## 2021-09-16 DIAGNOSIS — Z9079 Acquired absence of other genital organ(s): Secondary | ICD-10-CM

## 2021-09-16 MED ORDER — HYDROCHLOROTHIAZIDE 25 MG PO TABS: 25.0000 mg | ORAL_TABLET | Freq: Every day | ORAL | 0 refills | Status: DC

## 2021-09-16 NOTE — Progress Notes (Signed)
Pt present for Post Op f/u. Pt c/o of blurred vision and tingling in the hands since hysterectomy. No other concerns at this time.

## 2021-09-16 NOTE — Patient Instructions (Signed)

## 2021-09-16 NOTE — Progress Notes (Signed)
Sally Garner presents for f/u of TVH with BS. Pathology reviewed with pt Doing well. Denies any bowel or bladder dysfunction Tolerating diet   PE AF BP 155/104 ( not taking BP meds)  Lungs clear Heart RRR Abd soft + BS GU NL EGBUS, cuff healed, no masses or tenderness  A/P Post Op TVH/BS         Hypertension  Doing well. Return to nl ADL's as tolerates. Pt advised to see PCP for management of her HTN F/U in 1 yr or PRN

## 2021-12-22 ENCOUNTER — Emergency Department (HOSPITAL_COMMUNITY)
Admission: EM | Admit: 2021-12-22 | Discharge: 2021-12-23 | Disposition: A | Discharge status: Home / Self Care | Payer: Medicaid Other | Attending: Emergency Medicine | Admitting: Emergency Medicine

## 2021-12-22 ENCOUNTER — Encounter (HOSPITAL_COMMUNITY): Payer: Self-pay

## 2021-12-22 ENCOUNTER — Emergency Department (HOSPITAL_COMMUNITY): Payer: Medicaid Other

## 2021-12-22 ENCOUNTER — Other Ambulatory Visit: Payer: Self-pay

## 2021-12-22 DIAGNOSIS — R509 Fever, unspecified: Secondary | ICD-10-CM | POA: Diagnosis not present

## 2021-12-22 DIAGNOSIS — J189 Pneumonia, unspecified organism: Secondary | ICD-10-CM | POA: Diagnosis not present

## 2021-12-22 DIAGNOSIS — Z1152 Encounter for screening for COVID-19: Secondary | ICD-10-CM | POA: Diagnosis not present

## 2021-12-22 DIAGNOSIS — R112 Nausea with vomiting, unspecified: Secondary | ICD-10-CM | POA: Diagnosis not present

## 2021-12-22 DIAGNOSIS — J181 Lobar pneumonia, unspecified organism: Secondary | ICD-10-CM | POA: Insufficient documentation

## 2021-12-22 DIAGNOSIS — R0602 Shortness of breath: Secondary | ICD-10-CM | POA: Diagnosis not present

## 2021-12-22 DIAGNOSIS — R059 Cough, unspecified: Secondary | ICD-10-CM | POA: Diagnosis not present

## 2021-12-22 LAB — CBC
HCT: 34.8 % — ABNORMAL LOW (ref 36.0–46.0)
Hemoglobin: 11.2 g/dL — ABNORMAL LOW (ref 12.0–15.0)
MCH: 25.5 pg — ABNORMAL LOW (ref 26.0–34.0)
MCHC: 32.2 g/dL (ref 30.0–36.0)
MCV: 79.1 fL — ABNORMAL LOW (ref 80.0–100.0)
Platelets: 296 10*3/uL (ref 150–400)
RBC: 4.4 MIL/uL (ref 3.87–5.11)
RDW: 15.2 % (ref 11.5–15.5)
WBC: 8.2 10*3/uL (ref 4.0–10.5)
nRBC: 0 % (ref 0.0–0.2)

## 2021-12-22 LAB — RESP PANEL BY RT-PCR (FLU A&B, COVID) ARPGX2
Influenza A by PCR: NEGATIVE
Influenza B by PCR: NEGATIVE
SARS Coronavirus 2 by RT PCR: NEGATIVE

## 2021-12-22 LAB — BASIC METABOLIC PANEL
Anion gap: 13 (ref 5–15)
BUN: 5 mg/dL — ABNORMAL LOW (ref 6–20)
CO2: 22 mmol/L (ref 22–32)
Calcium: 8.9 mg/dL (ref 8.9–10.3)
Chloride: 103 mmol/L (ref 98–111)
Creatinine, Ser: 0.87 mg/dL (ref 0.44–1.00)
GFR, Estimated: >60 mL/min (ref >60)
Glucose, Bld: 129 mg/dL — ABNORMAL HIGH (ref 70–99)
Potassium: 3.4 mmol/L — ABNORMAL LOW (ref 3.5–5.1)
Sodium: 138 mmol/L (ref 135–145)

## 2021-12-22 LAB — LACTIC ACID, PLASMA
Lactic Acid, Venous: 2 mmol/L (ref 0.5–1.9)
Lactic Acid, Venous: 1.5 mmol/L (ref 0.5–1.9)

## 2021-12-22 LAB — I-STAT BETA HCG BLOOD, ED (MC, WL, AP ONLY): I-stat hCG, quantitative: <5 m[IU]/mL (ref <5)

## 2021-12-22 MED ORDER — ONDANSETRON 4 MG PO TBDP: ORAL_TABLET | ORAL | 0 refills | Status: DC

## 2021-12-22 MED ORDER — KETOROLAC TROMETHAMINE 15 MG/ML IJ SOLN
15.0000 mg | Freq: Once | INTRAMUSCULAR | Status: AC
  Administered 2021-12-22: 15 mg via INTRAVENOUS
  Filled 2021-12-22: qty 1

## 2021-12-22 MED ORDER — SODIUM CHLORIDE 0.9 % IV SOLN
500.0000 mg | Freq: Once | INTRAVENOUS | Status: AC
  Administered 2021-12-22: 500 mg via INTRAVENOUS
  Filled 2021-12-22: qty 5

## 2021-12-22 MED ORDER — AZITHROMYCIN 250 MG PO TABS: 250.0000 mg | ORAL_TABLET | Freq: Every day | ORAL | 0 refills | Status: DC

## 2021-12-22 MED ORDER — SODIUM CHLORIDE 0.9 % IV BOLUS
1000.0000 mL | Freq: Once | INTRAVENOUS | Status: AC
  Administered 2021-12-22: 1000 mL via INTRAVENOUS

## 2021-12-22 MED ORDER — SODIUM CHLORIDE 0.9 % IV SOLN
1.0000 g | Freq: Once | INTRAVENOUS | Status: AC
  Administered 2021-12-22: 1 g via INTRAVENOUS
  Filled 2021-12-22: qty 10

## 2021-12-22 MED ORDER — SODIUM CHLORIDE 0.9 % IV BOLUS: 1000.0000 mL | Freq: Once | INTRAVENOUS | Status: DC

## 2021-12-22 MED ORDER — ONDANSETRON HCL 4 MG/2ML IJ SOLN
4.0000 mg | Freq: Once | INTRAMUSCULAR | Status: AC
Start: 2021-12-22 — End: 2021-12-22
  Administered 2021-12-22: 4 mg via INTRAVENOUS
  Filled 2021-12-22: qty 2

## 2021-12-22 MED ORDER — ACETAMINOPHEN 325 MG PO TABS
650.0000 mg | ORAL_TABLET | Freq: Once | ORAL | Status: AC
  Administered 2021-12-22: 650 mg via ORAL
  Filled 2021-12-22: qty 2

## 2021-12-22 MED ORDER — ACETAMINOPHEN 500 MG PO TABS
1000.0000 mg | ORAL_TABLET | Freq: Once | ORAL | Status: AC
  Administered 2021-12-22: 1000 mg via ORAL
  Filled 2021-12-22: qty 2

## 2021-12-22 NOTE — ED Provider Notes (Signed)
[facility] Provider Note   CSN: [id number] Arrival date & time: 12/22/21  1747     History  Chief Complaint  Patient presents with   Emesis    Sally Garner is a 36 y.o. female.  Patient is a 36 year old female who presents with cough and fever.  She reports cough that started yesterday.  She had a fever that started today associated body aches and nausea and vomiting.  She states she is able to keep down liquids but not much solids.  She denies any diarrhea.  No shortness of breath.  No rhinorrhea.  She has 2 children that had similar symptoms over the last week.  One of them was diagnosed with mycoplasma pneumonia and is currently on antibiotics.       Home Medications Prior to Admission medications   Medication Sig Start Date End Date Taking? Authorizing Provider  azithromycin (ZITHROMAX) 250 MG tablet Take 1 tablet (250 mg total) by mouth daily. Take 1 tablet every day until finished. 12/22/21  Yes Malvin Johns, MD  hydrochlorothiazide (HYDRODIURIL) 25 MG tablet Take 1 tablet (25 mg total) by mouth daily. 09/16/21  Yes Dorna Mai, MD  ondansetron (ZOFRAN-ODT) 4 MG disintegrating tablet '4mg'$  ODT q4 hours prn nausea/vomit 12/22/21  Yes Malvin Johns, MD  ibuprofen (ADVIL) 800 MG tablet Take 1 tablet (800 mg total) by mouth every 8 (eight) hours as needed. Patient not taking: Reported on 12/22/2021 08/04/21   Chancy Milroy, MD  amLODipine (NORVASC) 10 MG tablet Take 1 tablet (10 mg total) by mouth daily. Patient not taking: Reported on 10/17/2017 07/25/17 01/10/20  Lajean Manes, CNM  ferrous sulfate 325 (65 FE) MG tablet Take 1 tablet (325 mg total) by mouth 3 (three) times daily with meals. Patient not taking: Reported on 10/17/2017 07/25/17 01/10/20  Lajean Manes, CNM      Allergies    Patient has no known allergies.    Review of Systems   Review of Systems  Constitutional:  Positive for fatigue and fever. Negative for  chills and diaphoresis.  HENT:  Negative for congestion, rhinorrhea and sneezing.   Eyes: Negative.   Respiratory:  Positive for cough. Negative for chest tightness and shortness of breath.   Cardiovascular:  Negative for chest pain and leg swelling.  Gastrointestinal:  Positive for nausea and vomiting. Negative for abdominal pain, blood in stool and diarrhea.  Genitourinary:  Negative for difficulty urinating, flank pain, frequency and hematuria.  Musculoskeletal:  Positive for myalgias. Negative for arthralgias and back pain.  Skin:  Negative for rash.  Neurological:  Negative for dizziness, speech difficulty, weakness, numbness and headaches.    Physical Exam Updated Vital Signs BP (!) 134/95   Pulse (!) 113   Temp 100 F (37.8 C) (Oral)   Resp 15   Ht '5\' 4"'$  (1.626 m)   Wt 104.3 kg   LMP 06/27/2021   SpO2 98%   BMI 39.48 kg/m  Physical Exam Constitutional:      Appearance: She is well-developed.  HENT:     Head: Normocephalic and atraumatic.  Eyes:     Pupils: Pupils are equal, round, and reactive to light.  Cardiovascular:     Rate and Rhythm: Normal rate and regular rhythm.     Heart sounds: Normal heart sounds.  Pulmonary:     Effort: Pulmonary effort is normal. No respiratory distress.     Breath sounds: Normal breath sounds. No wheezing or rales.  Chest:  Chest wall: No tenderness.  Abdominal:     General: Bowel sounds are normal.     Palpations: Abdomen is soft.     Tenderness: There is no abdominal tenderness. There is no guarding or rebound.  Musculoskeletal:        General: Normal range of motion.     Cervical back: Normal range of motion and neck supple.  Lymphadenopathy:     Cervical: No cervical adenopathy.  Skin:    General: Skin is warm and dry.     Findings: No rash.  Neurological:     Mental Status: She is alert and oriented to person, place, and time.     ED Results / Procedures / Treatments   Labs (all labs ordered are listed, but  only abnormal results are displayed) Labs Reviewed  CBC - Abnormal; Notable for the following components:      Result Value   Hemoglobin 11.2 (*)    HCT 34.8 (*)    MCV 79.1 (*)    MCH 25.5 (*)    All other components within normal limits  BASIC METABOLIC PANEL - Abnormal; Notable for the following components:   Potassium 3.4 (*)    Glucose, Bld 129 (*)    BUN 5 (*)    All other components within normal limits  LACTIC ACID, PLASMA - Abnormal; Notable for the following components:   Lactic Acid, Venous 2.0 (*)    All other components within normal limits  RESP PANEL BY RT-PCR (FLU A&B, COVID) ARPGX2  CULTURE, BLOOD (ROUTINE X 2)  CULTURE, BLOOD (ROUTINE X 2)  LACTIC ACID, PLASMA  URINALYSIS, ROUTINE W REFLEX MICROSCOPIC  I-STAT BETA HCG BLOOD, ED ([facility], [facility], [facility] ONLY)    EKG None  Radiology DG Chest 2 View  Result Date: 12/22/2021 CLINICAL DATA:  Cough, shortness of breath, fever EXAM: CHEST - 2 VIEW COMPARISON:  Previous studies including the examination of 07/07/2021 FINDINGS: Cardiac size is within normal limits. There are no signs of alveolar pulmonary edema. There is faint increased density in the medial right upper lung field overlying the upper aspect of right hilum measuring 3.6 x 2.5 cm. Rest of the lung fields are clear. There is no pleural effusion or pneumothorax. IMPRESSION: There is faint increased density in the medial right upper lung field overlying the upper right hilum. This may suggest an artifact due to confluence of bronchovascular structures or pneumonia. Short-term follow-up chest radiographs should be considered to assess resolution and to rule out any underlying lung nodule. Rest of the lung fields are unremarkable. There is no pleural effusion. Electronically Signed   By: Elmer Picker M.D.   On: 12/22/2021 20:32    Procedures Procedures    Medications Ordered in ED Medications  acetaminophen (TYLENOL) tablet 650 mg (has no administration in time  range)  acetaminophen (TYLENOL) tablet 1,000 mg (1,000 mg Oral Given 12/22/21 1802)  sodium chloride 0.9 % bolus 1,000 mL (0 mLs Intravenous Stopped 12/22/21 2309)  ondansetron (ZOFRAN) injection 4 mg (4 mg Intravenous Given 12/22/21 2018)  cefTRIAXone (ROCEPHIN) 1 g in sodium chloride 0.9 % 100 mL IVPB (0 g Intravenous Stopped 12/22/21 2212)  azithromycin (ZITHROMAX) 500 mg in sodium chloride 0.9 % 250 mL IVPB (0 mg Intravenous Stopped 12/22/21 2326)  ketorolac (TORADOL) 15 MG/ML injection 15 mg (15 mg Intravenous Given 12/22/21 2325)  sodium chloride 0.9 % bolus 1,000 mL (1,000 mLs Intravenous New Bag/Given 12/22/21 2326)    ED Course/ Medical Decision Making/ A&P  Medical Decision Making Risk OTC drugs. Prescription drug management.   Patient is a 36 year old female who presents with cough and fever.  Symptoms started yesterday.  She has had 2 sick children at home with similar symptoms, 1 that was diagnosed with mycoplasma pneumonia.  Her labs initially showed minimally elevated lactate.  However the repeat lactate was normal.  This was actually drawn even before she got IV fluid boluses.  She was given 1 L of IV fluid.  She was also given some Zofran.  She had a chest x-ray which was interpreted by me and confirmed by the radiologist to show a possibly developing infiltrate in the right upper lung.  Her labs are otherwise nonconcerning.  Her white count is normal.  Kidney function is normal.  Blood cultures were obtained.  She was given a dose of IV Rocephin and IV Zithromax for probable community-acquired pneumonia.  COVID/flu test was negative.  She was markedly tachycardic on arrival although I suspect this was in part due to her elevated temperature on arrival.  Her heart rate improved markedly once her temperature improved.  On my reevaluation, her heart rate was around 106.  At that point her temperature it started going back up to 100.  She was given a dose of  Tylenol.  She is overall well-appearing.  She is alert and interactive.  She does not have any increased work of breathing.  No hypoxia.  No ongoing vomiting.  She was able to drink and eat in the ED without any nausea.  Given her overall well-appearing state and low concern for sepsis, discussed options with her.  I discussed admission versus outpatient treatment.  At this point she is overall well-appearing and I feel that she is appropriate for outpatient treatment.  She concurs with this.  She was given prescriptions for Zithromax and Zofran.  She was encouraged to have close follow-up with her primary care doctor.  She was advised that she will need to repeat chest x-ray once her symptoms have cleared.  Strict return precautions were given.  Final Clinical Impression(s) / ED Diagnoses Final diagnoses:  Community acquired pneumonia of right upper lobe of lung    Rx / DC Orders ED Discharge Orders          Ordered    azithromycin (ZITHROMAX) 250 MG tablet  Daily        12/22/21 2340    ondansetron (ZOFRAN-ODT) 4 MG disintegrating tablet        12/22/21 2340              Malvin Johns, MD 12/22/21 2345

## 2021-12-22 NOTE — ED Provider Triage Note (Signed)
Emergency Medicine Provider Triage Evaluation Note  Chantel Teti , a 36 y.o. female  was evaluated in triage.  Pt complains of body aches and chills, nausea vomiting.  Patient reports that the symptoms began yesterday.  Patient reports that her son was recently tested positive for mycoplasma pneumonia.  Patient tachycardic, febrile in triage.  Review of Systems  Positive:  Negative:   Physical Exam  BP (!) 153/91 (BP Location: Right Arm)   Pulse (!) 134   Temp (!) 102 F (38.9 C) (Oral)   Resp (!) 24   Ht '5\' 4"'$  (1.626 m)   Wt 104.3 kg   LMP 06/27/2021   SpO2 98%   BMI 39.48 kg/m  Gen:   Awake, no distress   Resp:  Normal effort  MSK:   Moves extremities without difficulty  Other:    Medical Decision Making  Medically screening exam initiated at 6:00 PM.  Appropriate orders placed.  Pamula Luther was informed that the remainder of the evaluation will be completed by another provider, this initial triage assessment does not replace that evaluation, and the importance of remaining in the ED until their evaluation is complete.     Azucena Cecil, PA-C 12/22/21 1800

## 2021-12-22 NOTE — ED Triage Notes (Signed)
Pt reports productive cough and generalized body aches associated with vomiting that started today. HR 134 and temp 102 at triage. She reports her son was just diagnosed with pneumonia.

## 2021-12-22 NOTE — ED Notes (Signed)
Patient provided with crackers. Tolerating PO well

## 2021-12-22 NOTE — Discharge Instructions (Signed)
Follow-up with your primary care doctor within the next few days for recheck.  You will need a repeat chest x-ray once your symptoms have resolved to make sure that it looks normal.  This can be arranged by your primary care doctor.  Return to the emergency room for any worsening symptoms.

## 2021-12-22 NOTE — ED Notes (Signed)
Patient provided with water.

## 2021-12-22 NOTE — ED Notes (Signed)
Patient transported to X-ray 

## 2021-12-23 LAB — URINALYSIS, ROUTINE W REFLEX MICROSCOPIC
Bacteria, UA: NONE SEEN
Bilirubin Urine: NEGATIVE
Glucose, UA: NEGATIVE mg/dL
Hgb urine dipstick: NEGATIVE
Ketones, ur: NEGATIVE mg/dL
Nitrite: NEGATIVE
Protein, ur: 30 mg/dL — AB
Specific Gravity, Urine: 1.023 (ref 1.005–1.030)
pH: 5 (ref 5.0–8.0)

## 2021-12-27 LAB — CULTURE, BLOOD (ROUTINE X 2)
Culture: NO GROWTH
Culture: NO GROWTH
Special Requests: ADEQUATE

## 2022-03-08 ENCOUNTER — Other Ambulatory Visit: Payer: Self-pay | Admitting: Family Medicine

## 2022-03-16 ENCOUNTER — Other Ambulatory Visit: Payer: Self-pay | Admitting: Family Medicine

## 2022-04-25 ENCOUNTER — Ambulatory Visit (INDEPENDENT_AMBULATORY_CARE_PROVIDER_SITE_OTHER): Payer: Medicaid Other | Admitting: Family Medicine

## 2022-04-25 ENCOUNTER — Encounter: Payer: Self-pay | Admitting: Family Medicine

## 2022-04-25 VITALS — BP 160/108 | HR 90 | Temp 98.1°F | Resp 16 | Wt 252.2 lb

## 2022-04-25 DIAGNOSIS — K219 Gastro-esophageal reflux disease without esophagitis: Secondary | ICD-10-CM | POA: Insufficient documentation

## 2022-04-25 DIAGNOSIS — I10 Essential (primary) hypertension: Secondary | ICD-10-CM

## 2022-04-25 DIAGNOSIS — Z91199 Patient's noncompliance with other medical treatment and regimen due to unspecified reason: Secondary | ICD-10-CM

## 2022-04-25 DIAGNOSIS — D509 Iron deficiency anemia, unspecified: Secondary | ICD-10-CM | POA: Insufficient documentation

## 2022-04-25 DIAGNOSIS — Z6841 Body Mass Index (BMI) 40.0 and over, adult: Secondary | ICD-10-CM

## 2022-04-25 DIAGNOSIS — F17200 Nicotine dependence, unspecified, uncomplicated: Secondary | ICD-10-CM

## 2022-04-25 DIAGNOSIS — O139 Gestational [pregnancy-induced] hypertension without significant proteinuria, unspecified trimester: Secondary | ICD-10-CM | POA: Insufficient documentation

## 2022-04-25 DIAGNOSIS — Z3480 Encounter for supervision of other normal pregnancy, unspecified trimester: Secondary | ICD-10-CM | POA: Insufficient documentation

## 2022-04-25 DIAGNOSIS — M791 Myalgia, unspecified site: Secondary | ICD-10-CM | POA: Insufficient documentation

## 2022-04-25 DIAGNOSIS — O26849 Uterine size-date discrepancy, unspecified trimester: Secondary | ICD-10-CM | POA: Insufficient documentation

## 2022-04-25 MED ORDER — HYDROCHLOROTHIAZIDE 25 MG PO TABS: 25.0000 mg | ORAL_TABLET | Freq: Every day | ORAL | 0 refills | Status: DC

## 2022-04-25 NOTE — Progress Notes (Unsigned)
Error

## 2022-04-27 ENCOUNTER — Encounter: Payer: Self-pay | Admitting: Family Medicine

## 2022-04-27 NOTE — Progress Notes (Signed)
Established Patient Office Visit  Subjective    Patient ID: Sally Garner, female    DOB: [birth year]  Age: 37 y.o. MRN: [id number]  CC:  Chief Complaint  Patient presents with   Annual Exam    HPI Letty Ekins presents for follow up of chronic med issues.    Outpatient Encounter Medications as of 04/25/2022  Medication Sig   amoxicillin (AMOXIL) 500 MG capsule Take 500 mg by mouth every 8 (eight) hours.   ibuprofen (ADVIL) 800 MG tablet Take 1 tablet (800 mg total) by mouth every 8 (eight) hours as needed.   [DISCONTINUED] hydrochlorothiazide (HYDRODIURIL) 25 MG tablet Take 1 tablet (25 mg total) by mouth daily.   hydrochlorothiazide (HYDRODIURIL) 25 MG tablet Take 1 tablet (25 mg total) by mouth daily.   [DISCONTINUED] amLODipine (NORVASC) 10 MG tablet Take 1 tablet (10 mg total) by mouth daily. (Patient not taking: Reported on 10/17/2017)   [DISCONTINUED] azithromycin (ZITHROMAX) 250 MG tablet Take 1 tablet (250 mg total) by mouth daily. Take 1 tablet every day until finished.   [DISCONTINUED] ferrous sulfate 325 (65 FE) MG tablet Take 1 tablet (325 mg total) by mouth 3 (three) times daily with meals. (Patient not taking: Reported on 10/17/2017)   [DISCONTINUED] ondansetron (ZOFRAN-ODT) 4 MG disintegrating tablet '4mg'$  ODT q4 hours prn nausea/vomit   No facility-administered encounter medications on file as of 04/25/2022.    Past Medical History:  Diagnosis Date   Anemia    Hypertension    was put on medication end of 03/2015; states has caused headache and is not taking as prescribed   Migraines    Pre-diabetes    Status post repeat low transverse cesarean section 07/23/2017   Please schedule this patient for Postpartum visit in: 6 weeks with the following provider: Any provider For C/S patients schedule nurse incision check in weeks 2 weeks: yes High risk pregnancy complicated by: HTN Delivery mode:  CS Anticipated Birth Control:  BTL done PP PP Procedures needed: Incision  check, BP check in 1 week Schedule Integrated [place] visit: no    Status post tubal ligation 07/23/2017   Symptomatic cholelithiasis 04/2015    Past Surgical History:  Procedure Laterality Date   CESAREAN SECTION N/A 05/25/2013   Procedure: CESAREAN SECTION;  Surgeon: Annalee Genta, DO;  Location: [facility];  Service: Obstetrics;  Laterality: N/A;   CESAREAN SECTION N/A 07/23/2017   Procedure: CESAREAN SECTION;  Surgeon: Donnamae Jude, MD;  Location: [facility];  Service: Obstetrics;  Laterality: N/A;   CHOLECYSTECTOMY N/A 05/05/2015   Procedure: LAPAROSCOPIC CHOLECYSTECTOMY;  Surgeon: Coralie Keens, MD;  Location: [facility];  Service: General;  Laterality: N/A;   VAGINAL HYSTERECTOMY Bilateral 08/03/2021   Procedure: HYSTERECTOMY VAGINAL WITH BILATERAL SALPINGECTOMY;  Surgeon: Chancy Milroy, MD;  Location: [facility];  Service: Gynecology;  Laterality: Bilateral;    Family History  Problem Relation Age of Onset   Birth defects Brother        polydactyl   Hypertension Maternal Aunt    Hypertension Maternal Uncle    Cancer Paternal Aunt        breast cancer    Social History   Socioeconomic History   Marital status: Single    Spouse name: Not on file   Number of children: Not on file   Years of education: Not on file   Highest education level: Not on file  Occupational History   Not on file  Tobacco Use   Smoking  status: Every Day    Packs/day: 1.00    Years: 3.00    Total pack years: 3.00    Types: Cigarettes   Smokeless tobacco: Never  Vaping Use   Vaping Use: Never used  Substance and Sexual Activity   Alcohol use: Yes    Comment: rare   Drug use: No   Sexual activity: Not Currently    Partners: Male    Birth control/protection: Surgical  Other Topics Concern   Not on file  Social History Narrative   Not on file   Social Determinants of Health   Financial Resource Strain: Not on file  Food Insecurity: Not on file  Transportation Needs:  Not on file  Physical Activity: Not on file  Stress: Not on file  Social Connections: Not on file  Intimate Partner Violence: Not on file    Review of Systems  All other systems reviewed and are negative.       Objective    BP (!) 160/108   Pulse 90   Temp 98.1 F (36.7 C) (Oral)   Resp 16   Wt 252 lb 3.2 oz (114.4 kg)   LMP 06/27/2021   SpO2 96%   BMI 43.29 kg/m   Physical Exam Vitals and nursing note reviewed.  Constitutional:      General: She is not in acute distress.    Appearance: She is obese.  HENT:     Head: Normocephalic and atraumatic.  Eyes:     Pupils: Pupils are equal, round, and reactive to light.  Cardiovascular:     Rate and Rhythm: Normal rate and regular rhythm.  Pulmonary:     Effort: Pulmonary effort is normal.     Breath sounds: Normal breath sounds.  Abdominal:     Palpations: Abdomen is soft.     Tenderness: There is no abdominal tenderness.  Musculoskeletal:     Right lower leg: No edema.     Left lower leg: No edema.  Neurological:     General: No focal deficit present.     Mental Status: She is alert and oriented to person, place, and time.         Assessment & Plan:   1. Uncontrolled hypertension Elevated readings. Compliance discussed.   2. Noncompliance   3. Class 3 severe obesity due to excess calories with serious comorbidity and body mass index (BMI) of 40.0 to 44.9 in adult [facility]) Discussed dietary and activity options.   4. Smoking Discussed reduction/cessation    Return in about 4 weeks (around 05/23/2022) for follow up.   Becky Sax, MD

## 2022-05-31 ENCOUNTER — Ambulatory Visit (INDEPENDENT_AMBULATORY_CARE_PROVIDER_SITE_OTHER): Payer: Medicaid Other | Admitting: Family Medicine

## 2022-05-31 VITALS — BP 127/88 | HR 89 | Temp 98.0°F | Resp 16 | Wt 243.0 lb

## 2022-05-31 DIAGNOSIS — F17210 Nicotine dependence, cigarettes, uncomplicated: Secondary | ICD-10-CM | POA: Diagnosis not present

## 2022-05-31 DIAGNOSIS — F17200 Nicotine dependence, unspecified, uncomplicated: Secondary | ICD-10-CM

## 2022-05-31 DIAGNOSIS — I10 Essential (primary) hypertension: Secondary | ICD-10-CM | POA: Diagnosis not present

## 2022-05-31 DIAGNOSIS — Z6841 Body Mass Index (BMI) 40.0 and over, adult: Secondary | ICD-10-CM | POA: Diagnosis not present

## 2022-05-31 MED ORDER — HYDROCHLOROTHIAZIDE 25 MG PO TABS: 25.0000 mg | ORAL_TABLET | Freq: Every day | ORAL | 1 refills | Status: DC

## 2022-06-01 ENCOUNTER — Encounter: Payer: Self-pay | Admitting: Family Medicine

## 2022-06-01 NOTE — Progress Notes (Signed)
Established Patient Office Visit  Subjective    Patient ID: Sally Garner, female    DOB: [birth year]  Age: 37 y.o. MRN: [id number]  CC:  Chief Complaint  Patient presents with   Follow-up   Hypertension    HPI Sally Garner presents for follow up of hypertension. Patient denies acute complaints or concerns.    Outpatient Encounter Medications as of 05/31/2022  Medication Sig   amoxicillin (AMOXIL) 500 MG capsule Take 500 mg by mouth every 8 (eight) hours.   ibuprofen (ADVIL) 800 MG tablet Take 1 tablet (800 mg total) by mouth every 8 (eight) hours as needed.   [DISCONTINUED] hydrochlorothiazide (HYDRODIURIL) 25 MG tablet Take 1 tablet (25 mg total) by mouth daily.   hydrochlorothiazide (HYDRODIURIL) 25 MG tablet Take 1 tablet (25 mg total) by mouth daily.   [DISCONTINUED] amLODipine (NORVASC) 10 MG tablet Take 1 tablet (10 mg total) by mouth daily. (Patient not taking: Reported on 10/17/2017)   [DISCONTINUED] ferrous sulfate 325 (65 FE) MG tablet Take 1 tablet (325 mg total) by mouth 3 (three) times daily with meals. (Patient not taking: Reported on 10/17/2017)   No facility-administered encounter medications on file as of 05/31/2022.    Past Medical History:  Diagnosis Date   Anemia    Hypertension    was put on medication end of 03/2015; states has caused headache and is not taking as prescribed   Migraines    Pre-diabetes    Status post repeat low transverse cesarean section 07/23/2017   Please schedule this patient for Postpartum visit in: 6 weeks with the following provider: Any provider For C/S patients schedule nurse incision check in weeks 2 weeks: yes High risk pregnancy complicated by: HTN Delivery mode:  CS Anticipated Birth Control:  BTL done PP PP Procedures needed: Incision check, BP check in 1 week Schedule Integrated BH visit: no    Status post tubal ligation 07/23/2017   Symptomatic cholelithiasis 04/2015    Past Surgical History:  Procedure Laterality Date    CESAREAN SECTION N/A 05/25/2013   Procedure: CESAREAN SECTION;  Surgeon: Sharon Seller, DO;  Location: [facility];  Service: Obstetrics;  Laterality: N/A;   CESAREAN SECTION N/A 07/23/2017   Procedure: CESAREAN SECTION;  Surgeon: Reva Bores, MD;  Location: [facility];  Service: Obstetrics;  Laterality: N/A;   CHOLECYSTECTOMY N/A 05/05/2015   Procedure: LAPAROSCOPIC CHOLECYSTECTOMY;  Surgeon: Abigail Miyamoto, MD;  Location: [facility];  Service: General;  Laterality: N/A;   VAGINAL HYSTERECTOMY Bilateral 08/03/2021   Procedure: HYSTERECTOMY VAGINAL WITH BILATERAL SALPINGECTOMY;  Surgeon: Hermina Staggers, MD;  Location: [facility];  Service: Gynecology;  Laterality: Bilateral;    Family History  Problem Relation Age of Onset   Birth defects Brother        polydactyl   Hypertension Maternal Aunt    Hypertension Maternal Uncle    Cancer Paternal Aunt        breast cancer    Social History   Socioeconomic History   Marital status: Single    Spouse name: Not on file   Number of children: Not on file   Years of education: Not on file   Highest education level: 12th grade  Occupational History   Not on file  Tobacco Use   Smoking status: Every Day    Packs/day: 1.00    Years: 3.00    Additional pack years: 0.00    Total pack years: 3.00    Types: Cigarettes  Smokeless tobacco: Never  Vaping Use   Vaping Use: Never used  Substance and Sexual Activity   Alcohol use: Yes    Comment: rare   Drug use: No   Sexual activity: Not Currently    Partners: Male    Birth control/protection: Surgical  Other Topics Concern   Not on file  Social History Narrative   Not on file   Social Determinants of Health   Financial Resource Strain: Low Risk  (05/27/2022)   Overall Financial Resource Strain (CARDIA)    Difficulty of Paying Living Expenses: Not very hard  Food Insecurity: No Food Insecurity (05/27/2022)   Hunger Vital Sign    Worried About Running Out of Food in  the Last Year: Never true    Ran Out of Food in the Last Year: Never true  Transportation Needs: Unmet Transportation Needs (05/27/2022)   PRAPARE - Transportation    Lack of Transportation (Medical): Yes    Lack of Transportation (Non-Medical): Yes  Physical Activity: Insufficiently Active (05/27/2022)   Exercise Vital Sign    Days of Exercise per Week: 5 days    Minutes of Exercise per Session: 10 min  Stress: No Stress Concern Present (05/27/2022)   [employer] - Occupational Stress Questionnaire    Feeling of Stress : Only a little  Social Connections: Unknown (05/27/2022)   Social Connection and Isolation Panel [NHANES]    Frequency of Communication with Friends and Family: More than three times a week    Frequency of Social Gatherings with Friends and Family: Once a week    Attends Religious Services: Patient declined    Database administratorActive Member of Clubs or Organizations: No    Attends Engineer, structuralClub or Organization Meetings: Not on file    Marital Status: Patient declined  Intimate Partner Violence: Not on file    Review of Systems  All other systems reviewed and are negative.       Objective    BP 127/88   Pulse 89   Temp 98 F (36.7 C) (Oral)   Resp 16   Wt 243 lb (110.2 kg)   LMP 06/27/2021   SpO2 99%   BMI 41.71 kg/m   Physical Exam Vitals and nursing note reviewed.  Constitutional:      General: She is not in acute distress.    Appearance: She is obese.  HENT:     Head: Normocephalic and atraumatic.  Eyes:     Pupils: Pupils are equal, round, and reactive to light.  Cardiovascular:     Rate and Rhythm: Normal rate and regular rhythm.  Pulmonary:     Effort: Pulmonary effort is normal.     Breath sounds: Normal breath sounds.  Abdominal:     Palpations: Abdomen is soft.     Tenderness: There is no abdominal tenderness.  Musculoskeletal:     Right lower leg: No edema.     Left lower leg: No edema.  Neurological:     General: No focal deficit  present.     Mental Status: She is alert and oriented to person, place, and time.         Assessment & Plan:   1. Essential hypertension Much improved readings with present management. Continue and monitor  2. Class 3 severe obesity due to excess calories with serious comorbidity and body mass index (BMI) of 40.0 to 44.9 in adult Discussed dietary and activity options.   3. Smoking Discussed reduction/cessation    Return in about 3  months (around 08/30/2022) for follow up.   Tommie Raymond, MD

## 2022-08-05 ENCOUNTER — Encounter (HOSPITAL_COMMUNITY): Payer: Self-pay

## 2022-08-05 ENCOUNTER — Ambulatory Visit (HOSPITAL_COMMUNITY)
Admission: EM | Admit: 2022-08-05 | Discharge: 2022-08-05 | Disposition: A | Discharge status: Home / Self Care | Payer: Medicaid Other | Attending: Emergency Medicine | Admitting: Emergency Medicine

## 2022-08-05 DIAGNOSIS — R21 Rash and other nonspecific skin eruption: Secondary | ICD-10-CM

## 2022-08-05 DIAGNOSIS — L309 Dermatitis, unspecified: Secondary | ICD-10-CM

## 2022-08-05 MED ORDER — TRIAMCINOLONE ACETONIDE 0.1 % EX CREA: 1.0000 | TOPICAL_CREAM | Freq: Two times a day (BID) | CUTANEOUS | 0 refills | Status: DC

## 2022-08-05 MED ORDER — DEXAMETHASONE SODIUM PHOSPHATE 10 MG/ML IJ SOLN
10.0000 mg | Freq: Once | INTRAMUSCULAR | Status: AC
  Administered 2022-08-05: 10 mg via INTRAMUSCULAR

## 2022-08-05 MED ORDER — DEXAMETHASONE SODIUM PHOSPHATE 10 MG/ML IJ SOLN
INTRAMUSCULAR | Status: AC
  Filled 2022-08-05: qty 1

## 2022-08-05 NOTE — Discharge Instructions (Addendum)
We have given you a steroid injection here in clinic for the itching and inflammation.  Please use the Kenalog cream up to twice daily.  Do not use this for longer than 7 days, as this causes skin thinning.  Please avoid your triggers, like water parks.  You can consider switching to an unscented detergent like Tide Free and clear, or 1 without additives.  You can also consider using a lubricating lotion like Lubriderm, that is unscented.  I suggest following up with your primary care provider for further management of your eczema, they can refer you out to dermatology as indicated.  Please return to clinic for any new or concerning symptoms.

## 2022-08-05 NOTE — ED Provider Notes (Signed)
[facility]    CSN: [id number] Arrival date & time: 08/05/22  1652      History   Chief Complaint Chief Complaint  Patient presents with   Eczema    HPI Sally Garner is a 37 y.o. female.   Patient presents to the clinic for complaints of a eczema flareup.  She went to a water park a few days ago for her son's birthday, and noticed that afterward she had itching and a rash.  Rash to bilateral hands, inner elbows and her feet.  Has been trying topical cortisone steroid cream without relief.  Has been trying to get into dermatology, reports each time she calls there is a 29-month wait period.  Does not have anything currently scheduled.  Denies any chest pain, shortness of breath, fever, recent illness or congestion.   The history is provided by the patient and medical records.    Past Medical History:  Diagnosis Date   Anemia    Eczema    Hypertension    was put on medication end of 03/2015; states has caused headache and is not taking as prescribed   Migraines    Pre-diabetes    Status post repeat low transverse cesarean section 07/23/2017   Please schedule this patient for Postpartum visit in: 6 weeks with the following provider: Any provider For C/S patients schedule nurse incision check in weeks 2 weeks: yes High risk pregnancy complicated by: HTN Delivery mode:  CS Anticipated Birth Control:  BTL done PP PP Procedures needed: Incision check, BP check in 1 week Schedule Integrated BH visit: no    Status post tubal ligation 07/23/2017   Symptomatic cholelithiasis 04/2015    Patient Active Problem List   Diagnosis Date Noted   Gestational hypertension 04/25/2022   Uterine size-date discrepancy 04/25/2022   Muscle pain 04/25/2022   Iron deficiency anemia of pregnancy 04/25/2022   Gastroesophageal reflux disease 04/25/2022   Supervision of other normal pregnancy, antepartum 04/25/2022   H/O vaginal hysterectomy 09/16/2021   History of bilateral  salpingectomy 09/16/2021   Post-operative state 08/03/2021   Elevated hemoglobin A1c 01/03/2017   Anemia 05/24/2013   Smoker 05/24/2013   Obesity, unspecified--BMI 37.1 05/24/2013   Hx of primary hypertension--2-3 years ago 05/24/2013    Past Surgical History:  Procedure Laterality Date   CESAREAN SECTION N/A 05/25/2013   Procedure: CESAREAN SECTION;  Surgeon: Sharon Seller, DO;  Location: [facility];  Service: Obstetrics;  Laterality: N/A;   CESAREAN SECTION N/A 07/23/2017   Procedure: CESAREAN SECTION;  Surgeon: Reva Bores, MD;  Location: [facility];  Service: Obstetrics;  Laterality: N/A;   CHOLECYSTECTOMY N/A 05/05/2015   Procedure: LAPAROSCOPIC CHOLECYSTECTOMY;  Surgeon: Abigail Miyamoto, MD;  Location: [facility];  Service: General;  Laterality: N/A;   VAGINAL HYSTERECTOMY Bilateral 08/03/2021   Procedure: HYSTERECTOMY VAGINAL WITH BILATERAL SALPINGECTOMY;  Surgeon: Hermina Staggers, MD;  Location: [facility];  Service: Gynecology;  Laterality: Bilateral;    OB History     Gravida  2   Para  2   Term  2   Preterm      AB      Living  2      SAB      IAB      Ectopic      Multiple  0   Live Births  2            Home Medications    Prior to Admission medications  Medication Sig Start Date End Date Taking? Authorizing Provider  hydrochlorothiazide (HYDRODIURIL) 25 MG tablet Take 1 tablet (25 mg total) by mouth daily. 05/31/22  Yes Georganna Skeans, MD  triamcinolone cream (KENALOG) 0.1 % Apply 1 Application topically 2 (two) times daily. 08/05/22  Yes Rinaldo Ratel, [place], FNP  amLODipine (NORVASC) 10 MG tablet Take 1 tablet (10 mg total) by mouth daily. Patient not taking: Reported on 10/17/2017 07/25/17 01/10/20  Sharyon Cable, CNM  ferrous sulfate 325 (65 FE) MG tablet Take 1 tablet (325 mg total) by mouth 3 (three) times daily with meals. Patient not taking: Reported on 10/17/2017 07/25/17 01/10/20  Sharyon Cable, CNM    Family  History Family History  Problem Relation Age of Onset   Birth defects Brother        polydactyl   Hypertension Maternal Aunt    Hypertension Maternal Uncle    Cancer Paternal Aunt        breast cancer    Social History Social History   Tobacco Use   Smoking status: Every Day    Packs/day: 1.00    Years: 3.00    Additional pack years: 0.00    Total pack years: 3.00    Types: Cigarettes   Smokeless tobacco: Never  Vaping Use   Vaping Use: Never used  Substance Use Topics   Alcohol use: Yes    Comment: rare   Drug use: No     Allergies   Patient has no known allergies.   Review of Systems Review of Systems  Constitutional:  Negative for chills and fever.  Skin:  Positive for rash.     Physical Exam Triage Vital Signs ED Triage Vitals  Enc Vitals Group     BP 08/05/22 1721 122/88     Pulse Rate 08/05/22 1721 99     Resp 08/05/22 1721 18     Temp 08/05/22 1721 98.5 F (36.9 C)     Temp Source 08/05/22 1721 Oral     SpO2 08/05/22 1721 95 %     Weight 08/05/22 1721 230 lb (104.3 kg)     Height 08/05/22 1721 5\' 4"  (1.626 m)     Head Circumference --      Peak Flow --      Pain Score 08/05/22 1720 0     Pain Loc --      Pain Edu? --      Excl. in GC? --    No data found.  Updated Vital Signs BP 122/88 (BP Location: Right Arm)   Pulse 99   Temp 98.5 F (36.9 C) (Oral)   Resp 18   Ht 5\' 4"  (1.626 m)   Wt 230 lb (104.3 kg)   LMP 06/27/2021   SpO2 95%   BMI 39.48 kg/m   Visual Acuity Right Eye Distance:   Left Eye Distance:   Bilateral Distance:    Right Eye Near:   Left Eye Near:    Bilateral Near:     Physical Exam Vitals and nursing note reviewed.  Constitutional:      Appearance: Normal appearance.  HENT:     Head: Normocephalic and atraumatic.     Right Ear: External ear normal.     Left Ear: External ear normal.     Nose: Nose normal.     Mouth/Throat:     Mouth: Mucous membranes are moist.  Eyes:     Conjunctiva/sclera:  Conjunctivae normal.  Cardiovascular:     Rate and  Rhythm: Normal rate.  Pulmonary:     Effort: Pulmonary effort is normal. No respiratory distress.  Musculoskeletal:        General: No swelling. Normal range of motion.  Skin:    General: Skin is warm and dry.     Findings: Rash present. Rash is urticarial.     Comments: Urticarial rash to bilateral hands, lower arms and inner elbows.  Reports itching to her upper back, no rash noted.  Neurological:     General: No focal deficit present.     Mental Status: She is alert.  Psychiatric:        Mood and Affect: Mood normal.        Behavior: Behavior is cooperative.      UC Treatments / Results  Labs (all labs ordered are listed, but only abnormal results are displayed) Labs Reviewed - No data to display  EKG   Radiology No results found.  Procedures Procedures (including critical care time)  Medications Ordered in UC Medications  dexamethasone (DECADRON) injection 10 mg (10 mg Intramuscular Given 08/05/22 1744)    Initial Impression / Assessment and Plan / UC Course  I have reviewed the triage vital signs and the nursing notes.  Pertinent labs & imaging results that were available during my care of the patient were reviewed by me and considered in my medical decision making (see chart for details).  Vitals and triage reviewed, patient is hemodynamically stable.  Urticarial rash to hands, lower arms and inner elbows.  Given IM steroids for itching and inflammation, triamcinolone cream as well.  Encouraged to use Benadryl or hydroxyzine as needed for itching.  Encouraged to follow-up with PCP for dermatology referral.  Plan of care, follow-up care and return precautions given, no questions at this time.      Final Clinical Impressions(s) / UC Diagnoses   Final diagnoses:  Rash and nonspecific skin eruption  Eczema, unspecified type     Discharge Instructions      We have given you a steroid injection here in  clinic for the itching and inflammation.  Please use the Kenalog cream up to twice daily.  Do not use this for longer than 7 days, as this causes skin thinning.  Please avoid your triggers, like water parks.  You can consider switching to an unscented detergent like Tide Free and clear, or 1 without additives.  You can also consider using a lubricating lotion like Lubriderm, that is unscented.  I suggest following up with your primary care provider for further management of your eczema, they can refer you out to dermatology as indicated.  Please return to clinic for any new or concerning symptoms.      ED Prescriptions     Medication Sig Dispense Auth. Provider   triamcinolone cream (KENALOG) 0.1 % Apply 1 Application topically 2 (two) times daily. 30 g Briannia Laba, [place], [place]      PDMP not reviewed this encounter.   Anjelita Sheahan, [place] N, [place] 08/05/22 1750

## 2022-08-05 NOTE — ED Triage Notes (Signed)
Rash onset 2 weeks ago. Patient went to the water park and it aggravated her skin. Has a history of eczema.

## 2022-08-23 ENCOUNTER — Emergency Department (HOSPITAL_COMMUNITY)
Admission: EM | Admit: 2022-08-23 | Discharge: 2022-08-24 | Disposition: A | Discharge status: Home / Self Care | Payer: Medicaid Other | Attending: Emergency Medicine | Admitting: Emergency Medicine

## 2022-08-23 ENCOUNTER — Emergency Department (HOSPITAL_COMMUNITY): Payer: Medicaid Other

## 2022-08-23 ENCOUNTER — Other Ambulatory Visit: Payer: Self-pay

## 2022-08-23 DIAGNOSIS — M549 Dorsalgia, unspecified: Secondary | ICD-10-CM

## 2022-08-23 DIAGNOSIS — L309 Dermatitis, unspecified: Secondary | ICD-10-CM | POA: Diagnosis not present

## 2022-08-23 DIAGNOSIS — M47814 Spondylosis without myelopathy or radiculopathy, thoracic region: Secondary | ICD-10-CM | POA: Diagnosis not present

## 2022-08-23 DIAGNOSIS — M546 Pain in thoracic spine: Secondary | ICD-10-CM | POA: Diagnosis not present

## 2022-08-23 DIAGNOSIS — M545 Low back pain, unspecified: Secondary | ICD-10-CM | POA: Insufficient documentation

## 2022-08-23 LAB — PREGNANCY, URINE: Preg Test, Ur: NEGATIVE

## 2022-08-23 MED ORDER — KETOROLAC TROMETHAMINE 15 MG/ML IJ SOLN
15.0000 mg | Freq: Once | INTRAMUSCULAR | Status: AC
  Administered 2022-08-24: 15 mg via INTRAMUSCULAR
  Filled 2022-08-23: qty 1

## 2022-08-23 NOTE — ED Triage Notes (Signed)
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 Ambulatory in triage. No s/s of acute distress noted.

## 2022-08-23 NOTE — ED Provider Notes (Signed)
  [place] [facility]PART[facility] AT [facility] Provider Note   CSN: [id number] Arrival date & time: 08/23/22  1857     History {Add pertinent medical, surgical, social history, OB history to HPI:1} Chief Complaint  Patient presents with   Back Pain    Sally Garner is a 37 y.o. female.   Back Pain      Home Medications Prior to Admission medications   Medication Sig Start Date End Date Taking? Authorizing Provider  hydrochlorothiazide (HYDRODIURIL) 25 MG tablet Take 1 tablet (25 mg total) by mouth daily. 05/31/22  Yes Georganna Skeans, MD  ibuprofen (ADVIL) 200 MG tablet Take 200 mg by mouth every 6 (six) hours as needed for mild pain.   Yes [provider]  triamcinolone cream (KENALOG) 0.1 % Apply 1 Application topically 2 (two) times daily. Patient taking differently: Apply 1 Application topically daily as needed (Eczema). 08/05/22  Yes Rinaldo Ratel, [place], FNP  amLODipine (NORVASC) 10 MG tablet Take 1 tablet (10 mg total) by mouth daily. Patient not taking: Reported on 10/17/2017 07/25/17 01/10/20  Sharyon Cable, CNM  ferrous sulfate 325 (65 FE) MG tablet Take 1 tablet (325 mg total) by mouth 3 (three) times daily with meals. Patient not taking: Reported on 10/17/2017 07/25/17 01/10/20  Sharyon Cable, CNM      Allergies    Patient has no known allergies.    Review of Systems   Review of Systems  Musculoskeletal:  Positive for back pain.    Physical Exam Updated Vital Signs BP (!) 120/99 (BP Location: Left Arm)   Pulse 89   Temp 98.7 F (37.1 C) (Oral)   Resp 14   Ht 5\' 4"  (1.626 m)   Wt 104.3 kg   LMP 06/27/2021   SpO2 100%   BMI 39.48 kg/m  Physical Exam  ED Results / Procedures / Treatments   Labs (all labs ordered are listed, but only abnormal results are displayed) Labs Reviewed  POC URINE PREG, ED    EKG None  Radiology No results found.  Procedures Procedures  {Document cardiac monitor, telemetry assessment  procedure when appropriate:1}  Medications Ordered in ED Medications - No data to display  ED Course/ Medical Decision Making/ A&P   {   Click here for ABCD2, HEART and other calculatorsREFRESH Note before signing :1}                          Medical Decision Making  ***  {Document critical care time when appropriate:1} {Document review of labs and clinical decision tools ie heart score, Chads2Vasc2 etc:1}  {Document your independent review of radiology images, and any outside records:1} {Document your discussion with family members, caretakers, and with consultants:1} {Document social determinants of health affecting pt's care:1} {Document your decision making why or why not admission, treatments were needed:1} Final Clinical Impression(s) / ED Diagnoses Final diagnoses:  None    Rx / DC Orders ED Discharge Orders     None

## 2022-08-24 MED ORDER — IBUPROFEN 600 MG PO TABS: 600.0000 mg | ORAL_TABLET | Freq: Four times a day (QID) | ORAL | 0 refills | Status: DC | PRN

## 2022-08-24 MED ORDER — CLOBETASOL PROPIONATE 0.05 % EX OINT: 1.0000 | TOPICAL_OINTMENT | Freq: Two times a day (BID) | CUTANEOUS | 0 refills | Status: DC

## 2022-08-24 MED ORDER — TIZANIDINE HCL 4 MG PO TABS: 4.0000 mg | ORAL_TABLET | Freq: Three times a day (TID) | ORAL | 0 refills | Status: DC | PRN

## 2022-08-24 NOTE — Discharge Instructions (Addendum)
You were seen in the ER today for evaluation of your back pain.  Your x-ray does show some degenerative changes but no fractures.  This is likely muscular in nature.  I am prescribing you some muscle laxer to take as needed.  Please do not drive or operate machinery while this medication as it make you sleepy.  For pain, I recommend 1000mg  of Tylenol and/or 600 mg of ibuprofen every 6 hours as needed for pain.  You can also try over-the-counter lidocaine patches.  For your hand eczema, I prescribed you some clobetasol ointment for you to put on twice daily for the next 2 weeks.  Please make sure you are wearing cotton gloves at night to to see if this helps.  Make sure you follow with a dermatologist.  If you have any concerns or any worsening symptoms, please return to the nearest emergency room for evaluation.  Contact a health care provider if: You have pain that is not relieved with rest or medicine. You have increasing pain going down into your legs or buttocks. Your pain does not improve after 2 weeks. You have pain at night. You lose weight without trying. You have a fever or chills. You develop nausea or vomiting. You develop abdominal pain. Get help right away if: You develop new bowel or bladder control problems. You have unusual weakness or numbness in your arms or legs. You feel faint. These symptoms may represent a serious problem that is an emergency. Do not wait to see if the symptoms will go away. Get medical help right away. Call your local emergency services (911 in the [place] Do not drive yourself to the hospital.

## 2022-08-24 NOTE — ED Notes (Signed)
Discharge instructions reviewed and discussed. Opportunity for questions allowed.   Pharmacy verified.   Stretching exercises and nonpharmacologic interventions for back pain discussed.   Ambulatory and displays no signs of distress.

## 2022-09-01 ENCOUNTER — Encounter: Payer: Self-pay | Admitting: Family Medicine

## 2022-09-01 ENCOUNTER — Ambulatory Visit: Payer: Medicaid Other | Admitting: Family Medicine

## 2022-09-01 VITALS — BP 124/84 | HR 92 | Temp 98.5°F | Resp 14 | Wt 243.0 lb

## 2022-09-01 DIAGNOSIS — F17200 Nicotine dependence, unspecified, uncomplicated: Secondary | ICD-10-CM

## 2022-09-01 DIAGNOSIS — R519 Headache, unspecified: Secondary | ICD-10-CM

## 2022-09-01 DIAGNOSIS — I10 Essential (primary) hypertension: Secondary | ICD-10-CM | POA: Diagnosis not present

## 2022-09-01 NOTE — Progress Notes (Signed)
Pt is here for bp f/u   Complaining of headaches, everyday for X1 week   Complaining of "brown spots" in her eyes

## 2022-09-01 NOTE — Progress Notes (Signed)
Established Patient Office Visit  Subjective    Patient ID: Sally Garner, female    DOB: 07-26-1985  Age: 37 y.o. MRN: [id number]  CC:  Chief Complaint  Patient presents with   Hypertension    HPI Sally Garner presents for follow up of chronic med issues. Patient denies acute complaints.    Outpatient Encounter Medications as of 09/01/2022  Medication Sig   clobetasol ointment (TEMOVATE) 0.05 % Apply 1 Application topically 2 (two) times daily.   hydrochlorothiazide (HYDRODIURIL) 25 MG tablet Take 1 tablet (25 mg total) by mouth daily.   ibuprofen (ADVIL) 600 MG tablet Take 1 tablet (600 mg total) by mouth every 6 (six) hours as needed for mild pain.   tiZANidine (ZANAFLEX) 4 MG tablet Take 1 tablet (4 mg total) by mouth every 8 (eight) hours as needed for muscle spasms.   triamcinolone cream (KENALOG) 0.1 % Apply 1 Application topically 2 (two) times daily. (Patient taking differently: Apply 1 Application topically daily as needed (Eczema).)   [DISCONTINUED] amLODipine (NORVASC) 10 MG tablet Take 1 tablet (10 mg total) by mouth daily. (Patient not taking: Reported on 10/17/2017)   [DISCONTINUED] ferrous sulfate 325 (65 FE) MG tablet Take 1 tablet (325 mg total) by mouth 3 (three) times daily with meals. (Patient not taking: Reported on 10/17/2017)   No facility-administered encounter medications on file as of 09/01/2022.    Past Medical History:  Diagnosis Date   Anemia    Eczema    Hypertension    was put on medication end of 03/2015; states has caused headache and is not taking as prescribed   Migraines    Pre-diabetes    Status post repeat low transverse cesarean section 07/23/2017   Please schedule this patient for Postpartum visit in: 6 weeks with the following provider: Any provider For C/S patients schedule nurse incision check in weeks 2 weeks: yes High risk pregnancy complicated by: HTN Delivery mode:  CS Anticipated Birth Control:  BTL done PP PP Procedures needed:  Incision check, BP check in 1 week Schedule Integrated BH visit: no    Status post tubal ligation 07/23/2017   Symptomatic cholelithiasis 04/2015    Past Surgical History:  Procedure Laterality Date   CESAREAN SECTION N/A 05/25/2013   Procedure: CESAREAN SECTION;  Surgeon: Sharon Seller, DO;  Location: [facility];  Service: Obstetrics;  Laterality: N/A;   CESAREAN SECTION N/A 07/23/2017   Procedure: CESAREAN SECTION;  Surgeon: Reva Bores, MD;  Location: [facility];  Service: Obstetrics;  Laterality: N/A;   CHOLECYSTECTOMY N/A 05/05/2015   Procedure: LAPAROSCOPIC CHOLECYSTECTOMY;  Surgeon: Abigail Miyamoto, MD;  Location: [facility];  Service: General;  Laterality: N/A;   VAGINAL HYSTERECTOMY Bilateral 08/03/2021   Procedure: HYSTERECTOMY VAGINAL WITH BILATERAL SALPINGECTOMY;  Surgeon: Hermina Staggers, MD;  Location: [facility];  Service: Gynecology;  Laterality: Bilateral;    Family History  Problem Relation Age of Onset   Birth defects Brother        polydactyl   Hypertension Maternal Aunt    Hypertension Maternal Uncle    Cancer Paternal Aunt        breast cancer    Social History   Socioeconomic History   Marital status: Single    Spouse name: Not on file   Number of children: Not on file   Years of education: Not on file   Highest education level: 12th grade  Occupational History   Not on file  Tobacco Use  Smoking status: Every Day    Current packs/day: 1.00    Average packs/day: 1 pack/day for 3.0 years (3.0 ttl pk-yrs)    Types: Cigarettes   Smokeless tobacco: Never  Vaping Use   Vaping status: Never Used  Substance and Sexual Activity   Alcohol use: Yes    Comment: rare   Drug use: No   Sexual activity: Not Currently    Partners: Male    Birth control/protection: Surgical  Other Topics Concern   Not on file  Social History Narrative   Not on file   Social Determinants of Health   Financial Resource Strain: Low Risk  (05/27/2022)    Overall Financial Resource Strain (CARDIA)    Difficulty of Paying Living Expenses: Not very hard  Food Insecurity: No Food Insecurity (05/27/2022)   Hunger Vital Sign    Worried About Running Out of Food in the Last Year: Never true    Ran Out of Food in the Last Year: Never true  Transportation Needs: Unmet Transportation Needs (05/27/2022)   PRAPARE - Transportation    Lack of Transportation (Medical): Yes    Lack of Transportation (Non-Medical): Yes  Physical Activity: Insufficiently Active (05/27/2022)   Exercise Vital Sign    Days of Exercise per Week: 5 days    Minutes of Exercise per Session: 10 min  Stress: No Stress Concern Present (05/27/2022)   [employer] of Occupational Health - Occupational Stress Questionnaire    Feeling of Stress : Only a little  Social Connections: Unknown (05/27/2022)   Social Connection and Isolation Panel [NHANES]    Frequency of Communication with Friends and Family: More than three times a week    Frequency of Social Gatherings with Friends and Family: Once a week    Attends Religious Services: Patient declined    Database administrator or Organizations: No    Attends Engineer, structural: Not on file    Marital Status: Patient declined  Intimate Partner Violence: Not on file    Review of Systems  Neurological:  Positive for headaches.  All other systems reviewed and are negative.       Objective    BP 124/84 (BP Location: Right Arm, Patient Position: Sitting, Cuff Size: Normal)   Pulse 92   Temp 98.5 F (36.9 C)   Resp 14   Wt 243 lb (110.2 kg)   LMP 06/27/2021   SpO2 98%   BMI 41.71 kg/m   Physical Exam Vitals and nursing note reviewed.  Constitutional:      General: She is not in acute distress.    Appearance: She is obese.  HENT:     Head: Normocephalic and atraumatic.  Cardiovascular:     Rate and Rhythm: Normal rate and regular rhythm.  Pulmonary:     Effort: Pulmonary effort is normal.     Breath sounds:  Normal breath sounds.  Abdominal:     Palpations: Abdomen is soft.     Tenderness: There is no abdominal tenderness.  Neurological:     General: No focal deficit present.     Mental Status: She is alert and oriented to person, place, and time.         Assessment & Plan:   1. Essential hypertension Appears stable. Continue   2. Nonintractable headache, unspecified chronicity pattern, unspecified headache type Improved/stable with present management  3. Smoking Discussed cessation/reduction    No follow-ups on file.   Tommie Raymond, MD

## 2022-09-05 ENCOUNTER — Encounter: Payer: Self-pay | Admitting: Family Medicine

## 2022-12-02 ENCOUNTER — Ambulatory Visit: Payer: Medicaid Other | Admitting: Family Medicine

## 2022-12-02 ENCOUNTER — Encounter: Payer: Self-pay | Admitting: Family Medicine

## 2022-12-02 VITALS — BP 123/83 | HR 89 | Temp 98.5°F | Resp 16 | Ht 65.0 in | Wt 237.4 lb

## 2022-12-02 DIAGNOSIS — Z6839 Body mass index (BMI) 39.0-39.9, adult: Secondary | ICD-10-CM | POA: Diagnosis not present

## 2022-12-02 DIAGNOSIS — E66812 Obesity, class 2: Secondary | ICD-10-CM | POA: Diagnosis not present

## 2022-12-02 DIAGNOSIS — R739 Hyperglycemia, unspecified: Secondary | ICD-10-CM

## 2022-12-02 DIAGNOSIS — I10 Essential (primary) hypertension: Secondary | ICD-10-CM

## 2022-12-02 DIAGNOSIS — F17200 Nicotine dependence, unspecified, uncomplicated: Secondary | ICD-10-CM

## 2022-12-02 LAB — POCT GLYCOSYLATED HEMOGLOBIN (HGB A1C): Hemoglobin A1C: 6 % — AB (ref 4.0–5.6)

## 2022-12-02 MED ORDER — HYDROCHLOROTHIAZIDE 25 MG PO TABS: 25.0000 mg | ORAL_TABLET | Freq: Every day | ORAL | 1 refills | Status: DC

## 2022-12-02 NOTE — Progress Notes (Signed)
Established Patient Office Visit  Subjective    Patient ID: Sally Garner, female    DOB: [birth year]  Age: 37 y.o. MRN: [id number]  CC:  Chief Complaint  Patient presents with   Follow-up    B/p    HPI Sally Garner presents for follow up of chronic med issues. Patient denies acute complaints or concerns.   Outpatient Encounter Medications as of 12/02/2022  Medication Sig   ibuprofen (ADVIL) 600 MG tablet Take 1 tablet (600 mg total) by mouth every 6 (six) hours as needed for mild pain.   [DISCONTINUED] hydrochlorothiazide (HYDRODIURIL) 25 MG tablet Take 1 tablet (25 mg total) by mouth daily.   clobetasol ointment (TEMOVATE) 0.05 % Apply 1 Application topically 2 (two) times daily. (Patient not taking: Reported on 12/02/2022)   hydrochlorothiazide (HYDRODIURIL) 25 MG tablet Take 1 tablet (25 mg total) by mouth daily.   tiZANidine (ZANAFLEX) 4 MG tablet Take 1 tablet (4 mg total) by mouth every 8 (eight) hours as needed for muscle spasms. (Patient not taking: Reported on 12/02/2022)   triamcinolone cream (KENALOG) 0.1 % Apply 1 Application topically 2 (two) times daily. (Patient not taking: Reported on 12/02/2022)   [DISCONTINUED] amLODipine (NORVASC) 10 MG tablet Take 1 tablet (10 mg total) by mouth daily. (Patient not taking: Reported on 10/17/2017)   [DISCONTINUED] ferrous sulfate 325 (65 FE) MG tablet Take 1 tablet (325 mg total) by mouth 3 (three) times daily with meals. (Patient not taking: Reported on 10/17/2017)   No facility-administered encounter medications on file as of 12/02/2022.    Past Medical History:  Diagnosis Date   Anemia    Eczema    Hypertension    was put on medication end of 03/2015; states has caused headache and is not taking as prescribed   Migraines    Pre-diabetes    Status post repeat low transverse cesarean section 07/23/2017   Please schedule this patient for Postpartum visit in: 6 weeks with the following provider: Any provider For C/S  patients schedule nurse incision check in weeks 2 weeks: yes High risk pregnancy complicated by: HTN Delivery mode:  CS Anticipated Birth Control:  BTL done PP PP Procedures needed: Incision check, BP check in 1 week Schedule Integrated BH visit: no    Status post tubal ligation 07/23/2017   Symptomatic cholelithiasis 04/2015    Past Surgical History:  Procedure Laterality Date   CESAREAN SECTION N/A 05/25/2013   Procedure: CESAREAN SECTION;  Surgeon: Sharon Seller, DO;  Location: [facility];  Service: Obstetrics;  Laterality: N/A;   CESAREAN SECTION N/A 07/23/2017   Procedure: CESAREAN SECTION;  Surgeon: Reva Bores, MD;  Location: [facility];  Service: Obstetrics;  Laterality: N/A;   CHOLECYSTECTOMY N/A 05/05/2015   Procedure: LAPAROSCOPIC CHOLECYSTECTOMY;  Surgeon: Abigail Miyamoto, MD;  Location: [facility];  Service: General;  Laterality: N/A;   VAGINAL HYSTERECTOMY Bilateral 08/03/2021   Procedure: HYSTERECTOMY VAGINAL WITH BILATERAL SALPINGECTOMY;  Surgeon: Hermina Staggers, MD;  Location: [facility];  Service: Gynecology;  Laterality: Bilateral;    Family History  Problem Relation Age of Onset   Birth defects Brother        polydactyl   Hypertension Maternal Aunt    Hypertension Maternal Uncle    Cancer Paternal Aunt        breast cancer    Social History   Socioeconomic History   Marital status: Single    Spouse name: Not on file   Number of children: Not  on file   Years of education: Not on file   Highest education level: 12th grade  Occupational History   Not on file  Tobacco Use   Smoking status: Every Day    Current packs/day: 1.00    Average packs/day: 1 pack/day for 3.0 years (3.0 ttl pk-yrs)    Types: Cigarettes   Smokeless tobacco: Never  Vaping Use   Vaping status: Never Used  Substance and Sexual Activity   Alcohol use: Yes    Comment: rare   Drug use: No   Sexual activity: Not Currently    Partners: Male    Birth control/protection:  Surgical  Other Topics Concern   Not on file  Social History Narrative   Not on file   Social Determinants of Health   Financial Resource Strain: Low Risk  (05/27/2022)   Overall Financial Resource Strain (CARDIA)    Difficulty of Paying Living Expenses: Not very hard  Food Insecurity: No Food Insecurity (05/27/2022)   Hunger Vital Sign    Worried About Running Out of Food in the Last Year: Never true    Ran Out of Food in the Last Year: Never true  Transportation Needs: Unmet Transportation Needs (05/27/2022)   PRAPARE - Transportation    Lack of Transportation (Medical): Yes    Lack of Transportation (Non-Medical): Yes  Physical Activity: Insufficiently Active (05/27/2022)   Exercise Vital Sign    Days of Exercise per Week: 5 days    Minutes of Exercise per Session: 10 min  Stress: No Stress Concern Present (05/27/2022)   [employer] of Occupational Health - Occupational Stress Questionnaire    Feeling of Stress : Only a little  Social Connections: Unknown (05/27/2022)   Social Connection and Isolation Panel [NHANES]    Frequency of Communication with Friends and Family: More than three times a week    Frequency of Social Gatherings with Friends and Family: Once a week    Attends Religious Services: Patient declined    Database administrator or Organizations: No    Attends Engineer, structural: Not on file    Marital Status: Patient declined  Intimate Partner Violence: Not At Risk (12/02/2022)   Humiliation, Afraid, Rape, and Kick questionnaire    Fear of Current or Ex-Partner: No    Emotionally Abused: No    Physically Abused: No    Sexually Abused: No    Review of Systems  All other systems reviewed and are negative.       Objective    BP 123/83 (BP Location: Right Arm, Patient Position: Sitting, Cuff Size: Large)   Pulse 89   Temp 98.5 F (36.9 C) (Oral)   Resp 16   Ht 5\' 5"  (1.651 m)   Wt 237 lb 6.4 oz (107.7 kg)   LMP 06/27/2021   SpO2 95%   BMI  39.51 kg/m   Physical Exam Vitals and nursing note reviewed.  Constitutional:      General: She is not in acute distress.    Appearance: She is obese.  HENT:     Head: Normocephalic and atraumatic.  Cardiovascular:     Rate and Rhythm: Normal rate and regular rhythm.  Pulmonary:     Effort: Pulmonary effort is normal.     Breath sounds: Normal breath sounds.  Abdominal:     Palpations: Abdomen is soft.     Tenderness: There is no abdominal tenderness.  Neurological:     General: No focal deficit present.  Mental Status: She is alert and oriented to person, place, and time.         Assessment & Plan:  1. Essential hypertension Appears stable. Continue. Meds refilled.   2. Hyperglycemia A1c at goal.continue - HgB A1c  3. Smoking Discussed reduction/cessation  4. Class 2 severe obesity due to excess calories with serious comorbidity and body mass index (BMI) of 39.0 to 39.9 in adult [facility])      Return in about 6 months (around 06/02/2023) for follow up.   Tommie Raymond, MD

## 2023-01-20 ENCOUNTER — Other Ambulatory Visit: Payer: Self-pay

## 2023-01-20 ENCOUNTER — Encounter (HOSPITAL_COMMUNITY): Payer: Self-pay | Admitting: *Deleted

## 2023-01-20 ENCOUNTER — Ambulatory Visit (HOSPITAL_COMMUNITY)
Admission: EM | Admit: 2023-01-20 | Discharge: 2023-01-20 | Disposition: A | Discharge status: Home / Self Care | Payer: Medicaid Other | Attending: Emergency Medicine | Admitting: Emergency Medicine

## 2023-01-20 ENCOUNTER — Ambulatory Visit (INDEPENDENT_AMBULATORY_CARE_PROVIDER_SITE_OTHER): Payer: Medicaid Other

## 2023-01-20 DIAGNOSIS — R051 Acute cough: Secondary | ICD-10-CM

## 2023-01-20 DIAGNOSIS — J22 Unspecified acute lower respiratory infection: Secondary | ICD-10-CM

## 2023-01-20 DIAGNOSIS — R059 Cough, unspecified: Secondary | ICD-10-CM | POA: Diagnosis not present

## 2023-01-20 DIAGNOSIS — R911 Solitary pulmonary nodule: Secondary | ICD-10-CM | POA: Diagnosis not present

## 2023-01-20 MED ORDER — AZITHROMYCIN 250 MG PO TABS: 250.0000 mg | ORAL_TABLET | Freq: Every day | ORAL | 0 refills | Status: DC

## 2023-01-20 MED ORDER — PROMETHAZINE-DM 6.25-15 MG/5ML PO SYRP: 5.0000 mL | ORAL_SOLUTION | Freq: Four times a day (QID) | ORAL | 0 refills | Status: DC | PRN

## 2023-01-20 MED ORDER — PREDNISONE 20 MG PO TABS: 40.0000 mg | ORAL_TABLET | Freq: Every day | ORAL | 0 refills | Status: AC

## 2023-01-20 NOTE — ED Triage Notes (Addendum)
C/O occasionally productive cough over past 3 days. C/O chest pain with coughing. Denies fevers. Has been taking OTC cough pills without relief. States her son was diagnosed with "walking pneumonia" last wk.

## 2023-01-20 NOTE — Discharge Instructions (Signed)
Your chest x-ray showed some irregularities.  Take the antibiotics as prescribed and until finished, you can take them with food over an gastrointestinal upset.  Start the steroids tomorrow with breakfast to help with any wheezing or shortness of breath.  You can use the cough medicine as needed, do not drink or drive on this as it may cause drowsiness.  You should see some improvement over the next 3 days on antibiotics, if no improvement or any changes follow-up with your primary care provider.

## 2023-01-20 NOTE — ED Provider Notes (Signed)
[facility]    CSN: [id number] Arrival date & time: 01/20/23  1556      History   Chief Complaint Chief Complaint  Patient presents with   Cough    HPI Sally Garner is a 37 y.o. female.   Patient presents to clinic complaining of a harsh cough that causes chest pain.  She has not had any fevers.  Has been taking over-the-counter generic of DayQuil and NyQuil but this is not helped her at all.  Her son was diagnosed with walking pneumonia last week.  She has a history of bronchitis, does feel like she is wheezing with her coughing fits.  The history is provided by the patient and medical records.  Cough   Past Medical History:  Diagnosis Date   Anemia    Eczema    Hypertension    was put on medication end of 03/2015; states has caused headache and is not taking as prescribed   Migraines    Pre-diabetes    Status post repeat low transverse cesarean section 07/23/2017   Please schedule this patient for Postpartum visit in: 6 weeks with the following provider: Any provider For C/S patients schedule nurse incision check in weeks 2 weeks: yes High risk pregnancy complicated by: HTN Delivery mode:  CS Anticipated Birth Control:  BTL done PP PP Procedures needed: Incision check, BP check in 1 week Schedule Integrated BH visit: no    Status post tubal ligation 07/23/2017   Symptomatic cholelithiasis 04/2015    Patient Active Problem List   Diagnosis Date Noted   Gestational hypertension 04/25/2022   Uterine size-date discrepancy 04/25/2022   Muscle pain 04/25/2022   Iron deficiency anemia of pregnancy 04/25/2022   Gastroesophageal reflux disease 04/25/2022   Supervision of other normal pregnancy, antepartum 04/25/2022   H/O vaginal hysterectomy 09/16/2021   History of bilateral salpingectomy 09/16/2021   Post-operative state 08/03/2021   Elevated hemoglobin A1c 01/03/2017   Anemia 05/24/2013   Smoker 05/24/2013   Obesity, unspecified--BMI 37.1 05/24/2013    Hx of primary hypertension--2-3 years ago 05/24/2013    Past Surgical History:  Procedure Laterality Date   CESAREAN SECTION N/A 05/25/2013   Procedure: CESAREAN SECTION;  Surgeon: Sharon Seller, DO;  Location: [facility];  Service: Obstetrics;  Laterality: N/A;   CESAREAN SECTION N/A 07/23/2017   Procedure: CESAREAN SECTION;  Surgeon: Reva Bores, MD;  Location: [facility];  Service: Obstetrics;  Laterality: N/A;   CHOLECYSTECTOMY N/A 05/05/2015   Procedure: LAPAROSCOPIC CHOLECYSTECTOMY;  Surgeon: Abigail Miyamoto, MD;  Location: [facility];  Service: General;  Laterality: N/A;   VAGINAL HYSTERECTOMY Bilateral 08/03/2021   Procedure: HYSTERECTOMY VAGINAL WITH BILATERAL SALPINGECTOMY;  Surgeon: Hermina Staggers, MD;  Location: [facility];  Service: Gynecology;  Laterality: Bilateral;    OB History     Gravida  2   Para  2   Term  2   Preterm      AB      Living  2      SAB      IAB      Ectopic      Multiple  0   Live Births  2            Home Medications    Prior to Admission medications   Medication Sig Start Date End Date Taking? Authorizing Provider  azithromycin (ZITHROMAX) 250 MG tablet Take 1 tablet (250 mg total) by mouth daily. Take first 2 tablets together,  then 1 every day until finished. 01/20/23  Yes Rinaldo Ratel, [place] N, FNP  hydrochlorothiazide (HYDRODIURIL) 25 MG tablet Take 1 tablet (25 mg total) by mouth daily. 12/02/22  Yes Georganna Skeans, MD  predniSONE (DELTASONE) 20 MG tablet Take 2 tablets (40 mg total) by mouth daily for 5 days. 01/20/23 01/25/23 Yes Rinaldo Ratel, [place] N, FNP  promethazine-dextromethorphan (PROMETHAZINE-DM) 6.25-15 MG/5ML syrup Take 5 mLs by mouth 4 (four) times daily as needed for cough. 01/20/23  Yes Rinaldo Ratel, [place] N, FNP  clobetasol ointment (TEMOVATE) 0.05 % Apply 1 Application topically 2 (two) times daily. Patient not taking: Reported on 12/02/2022 08/24/22   Achille Rich, PA-C  ibuprofen (ADVIL) 600  MG tablet Take 1 tablet (600 mg total) by mouth every 6 (six) hours as needed for mild pain. 08/24/22   Achille Rich, PA-C  tiZANidine (ZANAFLEX) 4 MG tablet Take 1 tablet (4 mg total) by mouth every 8 (eight) hours as needed for muscle spasms. Patient not taking: Reported on 12/02/2022 08/24/22   Achille Rich, PA-C  triamcinolone cream (KENALOG) 0.1 % Apply 1 Application topically 2 (two) times daily. Patient not taking: Reported on 12/02/2022 08/05/22   Zhanna Melin, [place] N, FNP  amLODipine (NORVASC) 10 MG tablet Take 1 tablet (10 mg total) by mouth daily. Patient not taking: Reported on 10/17/2017 07/25/17 01/10/20  Sharyon Cable, CNM  ferrous sulfate 325 (65 FE) MG tablet Take 1 tablet (325 mg total) by mouth 3 (three) times daily with meals. Patient not taking: Reported on 10/17/2017 07/25/17 01/10/20  Sharyon Cable, CNM    Family History Family History  Problem Relation Age of Onset   Birth defects Brother        polydactyl   Hypertension Maternal Aunt    Hypertension Maternal Uncle    Cancer Paternal Aunt        breast cancer    Social History Social History   Tobacco Use   Smoking status: Every Day    Current packs/day: 1.00    Average packs/day: 1 pack/day for 3.0 years (3.0 ttl pk-yrs)    Types: Cigarettes   Smokeless tobacco: Never  Vaping Use   Vaping status: Never Used  Substance Use Topics   Alcohol use: Not Currently   Drug use: No     Allergies   Patient has no known allergies.   Review of Systems Review of Systems  Pr HPI   Physical Exam Triage Vital Signs ED Triage Vitals  Encounter Vitals Group     BP 01/20/23 1643 (!) 143/99     Systolic BP Percentile --      Diastolic BP Percentile --      Pulse Rate 01/20/23 1643 (!) 129     Resp 01/20/23 1643 16     Temp 01/20/23 1643 98.3 F (36.8 C)     Temp Source 01/20/23 1643 Oral     SpO2 01/20/23 1643 95 %     Weight --      Height --      Head Circumference --      Peak Flow --      Pain  Score 01/20/23 1645 8     Pain Loc --      Pain Education --      Exclude from Growth Chart --    No data found.  Updated Vital Signs BP (!) 143/99   Pulse (!) 129 Comment: pt consistently coughing during exam  Temp 98.3 F (36.8 C) (Oral)   Resp 16  LMP 06/27/2021   SpO2 95%   Visual Acuity Right Eye Distance:   Left Eye Distance:   Bilateral Distance:    Right Eye Near:   Left Eye Near:    Bilateral Near:     Physical Exam Vitals and nursing note reviewed.  Constitutional:      Appearance: Normal appearance.  HENT:     Head: Normocephalic and atraumatic.     Right Ear: External ear normal.     Left Ear: External ear normal.     Nose: Congestion and rhinorrhea present.     Mouth/Throat:     Mouth: Mucous membranes are moist.     Pharynx: Posterior oropharyngeal erythema present.  Eyes:     Conjunctiva/sclera: Conjunctivae normal.  Cardiovascular:     Rate and Rhythm: Regular rhythm. Tachycardia present.     Heart sounds: Normal heart sounds. No murmur heard. Pulmonary:     Effort: Pulmonary effort is normal. No respiratory distress.     Breath sounds: Normal breath sounds.  Musculoskeletal:        General: Normal range of motion.  Skin:    General: Skin is warm and dry.  Neurological:     General: No focal deficit present.     Mental Status: She is alert and oriented to person, place, and time.  Psychiatric:        Mood and Affect: Mood normal.        Behavior: Behavior normal.      UC Treatments / Results  Labs (all labs ordered are listed, but only abnormal results are displayed) Labs Reviewed - No data to display  EKG   Radiology DG Chest 2 View  Result Date: 01/20/2023 CLINICAL DATA:  Cough EXAM: CHEST - 2 VIEW COMPARISON:  Chest x-ray 12/22/2021.  Chest CT 01/19/2021. FINDINGS: There is nodular prominence in the right suprahilar region which appears unchanged from prior. The cardiomediastinal silhouette is otherwise within normal limits.  There is no lung consolidation, pleural effusion or pneumothorax. There is a 13 mm nodular density projecting over the left lower lung, indeterminate. No acute fractures are seen. IMPRESSION: 1. Nodular prominence in the right suprahilar region appears unchanged from prior. 2. 13 mm nodular density projecting over the left lower lung, indeterminate. Recommend further evaluation with chest CT. Electronically Signed   By: Darliss Cheney M.D.   On: 01/20/2023 18:05    Procedures Procedures (including critical care time)  Medications Ordered in UC Medications - No data to display  Initial Impression / Assessment and Plan / UC Course  I have reviewed the triage vital signs and the nursing notes.  Pertinent labs & imaging results that were available during my care of the patient were reviewed by me and considered in my medical decision making (see chart for details).  Vitals and triage reviewed, patient is hemodynamically stable.  She is tachycardic with multiple coughing spells and rooms.  Chest x-ray shows irregularity in the left lower lobe, will cover with azithromycin for atypical pneumonia due to recent exposure.  Radiology recommends further characterization with CT scan, PCP notified of results via Epic.  Cough management discussed.  Plan of care, follow-up care return precautions given, no questions at this time.    Final Clinical Impressions(s) / UC Diagnoses   Final diagnoses:  Lower respiratory infection  Acute cough     Discharge Instructions      Your chest x-ray showed some irregularities.  Take the antibiotics as prescribed and until finished, you can  take them with food over an gastrointestinal upset.  Start the steroids tomorrow with breakfast to help with any wheezing or shortness of breath.  You can use the cough medicine as needed, do not drink or drive on this as it may cause drowsiness.  You should see some improvement over the next 3 days on antibiotics, if no improvement  or any changes follow-up with your primary care provider.     ED Prescriptions     Medication Sig Dispense Auth. Provider   predniSONE (DELTASONE) 20 MG tablet Take 2 tablets (40 mg total) by mouth daily for 5 days. 10 tablet Rinaldo Ratel, [place] N, FNP   azithromycin (ZITHROMAX) 250 MG tablet Take 1 tablet (250 mg total) by mouth daily. Take first 2 tablets together, then 1 every day until finished. 6 tablet Rinaldo Ratel, [place] N, [place]   promethazine-dextromethorphan (PROMETHAZINE-DM) 6.25-15 MG/5ML syrup Take 5 mLs by mouth 4 (four) times daily as needed for cough. 118 mL Reva Pinkley, [place] N, [place]      PDMP not reviewed this encounter.   Jessi Pitstick, [place] N, [place] 01/20/23 [phone]

## 2023-01-28 ENCOUNTER — Emergency Department (HOSPITAL_COMMUNITY): Payer: Medicaid Other

## 2023-01-28 ENCOUNTER — Emergency Department (HOSPITAL_COMMUNITY)
Admission: EM | Admit: 2023-01-28 | Discharge: 2023-01-28 | Disposition: A | Discharge status: Home / Self Care | Payer: Medicaid Other | Attending: Emergency Medicine | Admitting: Emergency Medicine

## 2023-01-28 ENCOUNTER — Other Ambulatory Visit: Payer: Self-pay

## 2023-01-28 ENCOUNTER — Encounter (HOSPITAL_COMMUNITY): Payer: Self-pay | Admitting: Emergency Medicine

## 2023-01-28 DIAGNOSIS — I10 Essential (primary) hypertension: Secondary | ICD-10-CM | POA: Diagnosis not present

## 2023-01-28 DIAGNOSIS — M549 Dorsalgia, unspecified: Secondary | ICD-10-CM | POA: Diagnosis not present

## 2023-01-28 DIAGNOSIS — S39012A Strain of muscle, fascia and tendon of lower back, initial encounter: Secondary | ICD-10-CM | POA: Insufficient documentation

## 2023-01-28 DIAGNOSIS — X58XXXA Exposure to other specified factors, initial encounter: Secondary | ICD-10-CM | POA: Diagnosis not present

## 2023-01-28 LAB — URINALYSIS, ROUTINE W REFLEX MICROSCOPIC
Bilirubin Urine: NEGATIVE
Glucose, UA: NEGATIVE mg/dL
Hgb urine dipstick: NEGATIVE
Ketones, ur: NEGATIVE mg/dL
Leukocytes,Ua: NEGATIVE
Nitrite: NEGATIVE
Protein, ur: NEGATIVE mg/dL
Specific Gravity, Urine: 1.024 (ref 1.005–1.030)
pH: 6 (ref 5.0–8.0)

## 2023-01-28 MED ORDER — DICLOFENAC SODIUM 1 % EX GEL: 2.0000 g | Freq: Four times a day (QID) | CUTANEOUS | 0 refills | Status: DC

## 2023-01-28 MED ORDER — IBUPROFEN 800 MG PO TABS: 800.0000 mg | ORAL_TABLET | Freq: Three times a day (TID) | ORAL | 0 refills | Status: DC | PRN

## 2023-01-28 MED ORDER — KETOROLAC TROMETHAMINE 30 MG/ML IJ SOLN
30.0000 mg | Freq: Once | INTRAMUSCULAR | Status: AC
  Administered 2023-01-28: 30 mg via INTRAMUSCULAR
  Filled 2023-01-28: qty 1

## 2023-01-28 MED ORDER — METHOCARBAMOL 500 MG PO TABS: 500.0000 mg | ORAL_TABLET | Freq: Four times a day (QID) | ORAL | 0 refills | Status: DC | PRN

## 2023-01-28 NOTE — Discharge Instructions (Signed)
You have been seen in the Emergency Department (ED)  today for back pain.  Your workup and exam have not shown any acute abnormalities and you are likely suffering from muscle strain or possible problems with your discs, but there is no treatment that will fix your symptoms at this time.  Please take Motrin (ibuprofen) as needed for your pain according to the instructions written on the box.  Alternatively, for the next five days you can take '800mg'$  three times daily with meals (it may upset your stomach).   Please follow up with your doctor as soon as possible regarding today's ED visit and your back pain.  Return to the ED for worsening back pain, fever, weakness or numbness of either leg, or if you develop either (1) an inability to urinate or have bowel movements, or (2) loss of your ability to control your bathroom functions (if you start having "accidents"), or if you develop other new symptoms that concern you.

## 2023-01-28 NOTE — ED Triage Notes (Signed)
Pt arrives via POV from home with mid low back pain for the last 2 days. Denies radiation of pain. Denies recent injury or heavy lifting. Pt reports she has had this problem once prior. States took a muscle relaxer last night but didn't help.

## 2023-01-28 NOTE — ED Provider Notes (Signed)
Emergency Department Provider Note   I have reviewed the triage vital signs and the nursing notes.   HISTORY  Chief Complaint Back Pain   HPI Sally Garner is a 37 y.o. female past history reviewed below presents to the emergency department with mid back pain now radiating to the lower back.  She has had similar pain in the past which required ED evaluation but those symptoms resolved with Motrin and a muscle relaxer.  She denies any fevers or chills.  No IV drug use.  No numbness or weakness.  No radiation to the legs.  No groin numbness.  No bowel or bladder incontinence or retention.  She has not been lifting and does not recall any specific injury.  She took her muscle relaxer last night with no relief in symptoms.   Past Medical History:  Diagnosis Date   Anemia    Eczema    Hypertension    was put on medication end of 03/2015; states has caused headache and is not taking as prescribed   Migraines    Pre-diabetes    Status post repeat low transverse cesarean section 07/23/2017   Please schedule this patient for Postpartum visit in: 6 weeks with the following provider: Any provider For C/S patients schedule nurse incision check in weeks 2 weeks: yes High risk pregnancy complicated by: HTN Delivery mode:  CS Anticipated Birth Control:  BTL done PP PP Procedures needed: Incision check, BP check in 1 week Schedule Integrated BH visit: no    Status post tubal ligation 07/23/2017   Symptomatic cholelithiasis 04/2015    Review of Systems  Constitutional: No fever/chills Cardiovascular: Denies chest pain. Respiratory: Denies shortness of breath. Gastrointestinal: No abdominal pain.  No nausea, no vomiting.  Skin: Negative for rash. Neurological: Negative for headaches.  ____________________________________________   PHYSICAL EXAM:  VITAL SIGNS: ED Triage Vitals  Encounter Vitals Group     BP 01/28/23 0827 (!) 118/90     Pulse Rate 01/28/23 0827 97     Resp 01/28/23  0827 18     Temp 01/28/23 0827 97.9 F (36.6 C)     Temp src --      SpO2 01/28/23 0827 96 %     Weight 01/28/23 0831 230 lb (104.3 kg)     Height 01/28/23 0831 5\' 5"  (1.651 m)   Constitutional: Alert and oriented. Well appearing and in no acute distress. Eyes: Conjunctivae are normal.  Head: Atraumatic. Nose: No congestion/rhinnorhea. Mouth/Throat: Mucous membranes are moist.  Neck: No stridor.   Cardiovascular: Normal rate, regular rhythm. Good peripheral circulation. Grossly normal heart sounds.   Respiratory: Normal respiratory effort.  No retractions. Lungs CTAB. Gastrointestinal: Soft and nontender. No distention.  Musculoskeletal: No gross deformities of extremities.  Patient with paraspinal tenderness in the thoracic and lumbar regions.  More midline tenderness in the lumbar spine but diffuse.  No step-offs. Neurologic:  Normal speech and language. No gross focal neurologic deficits are appreciated.  5/5 strength in the bilateral lower extremities with normal sensation.  Skin:  Skin is warm, dry and intact. No rash noted.   ____________________________________________   LABS (all labs ordered are listed, but only abnormal results are displayed)  Labs Reviewed  URINALYSIS, ROUTINE W REFLEX MICROSCOPIC - Abnormal; Notable for the following components:      Result Value   Color, Urine AMBER (*)    APPearance HAZY (*)    All other components within normal limits   ____________________________________________  RADIOLOGY  DG Lumbar  Spine Complete  Result Date: 01/28/2023 CLINICAL DATA:  Back pain EXAM: LUMBAR SPINE - COMPLETE 4+ VIEW COMPARISON:  None Available. FINDINGS: There is no evidence of lumbar spine fracture. Alignment is normal. Intervertebral disc spaces are maintained. IMPRESSION: Negative. Electronically Signed   By: Kennith Center M.D.   On: 01/28/2023 09:52    ____________________________________________   PROCEDURES  Procedure(s) performed:    Procedures  None  ____________________________________________   INITIAL IMPRESSION / ASSESSMENT AND PLAN / ED COURSE  Pertinent labs & imaging results that were available during my care of the patient were reviewed by me and considered in my medical decision making (see chart for details).   This patient is Presenting for Evaluation of back pain, which does require a range of treatment options, and is a complaint that involves a high risk of morbidity and mortality.  The Differential Diagnoses includes but is not exclusive to musculoskeletal back pain, renal colic, urinary tract infection, pyelonephritis, intra-abdominal causes of back pain, aortic aneurysm or dissection, cauda equina syndrome, sciatica, lumbar disc disease, thoracic disc disease, etc.   Critical Interventions-    Medications  ketorolac (TORADOL) 30 MG/ML injection 30 mg (30 mg Intramuscular Given 01/28/23 0913)    Reassessment after intervention:  pain improved.   I decided to review pertinent External Data, and in summary patient with similar ED visit and normal thoracic spine XR in July.   Clinical Laboratory Tests Ordered, included UA without infection.   Radiologic Tests Ordered, included Lumbar spine XR. I independently interpreted the images and agree with radiology interpretation.   Cardiac Monitor Tracing which shows NSR.    Social Determinants of Health Risk patient is a smoker.   Medical Decision Making: Summary:  Patient presents emergency department for evaluation of back pain.  No red flag signs or symptoms to prompt advanced spine imaging.  She did have thoracic spine x-ray earlier this year which was normal.  I reviewed those films.  Plan for lumbar spine plain film here.  She has a history of hysterectomy and so will defer pregnancy testing but will obtain a UA although is not having symptoms of UTI. Very low pyelonephritis risk.   Reevaluation with update and discussion with patient. XR  reassuring. Plan to change MSK relaxing meds and NSAIDs at home. Contact info given for spine outpatient follow up. Discussed ED return precautions.   Patient's presentation is most consistent with acute, uncomplicated illness.   Disposition: discharge  ____________________________________________  FINAL CLINICAL IMPRESSION(S) / ED DIAGNOSES  Final diagnoses:  Strain of lumbar region, initial encounter     NEW OUTPATIENT MEDICATIONS STARTED DURING THIS VISIT:  New Prescriptions   DICLOFENAC SODIUM (VOLTAREN) 1 % GEL    Apply 2 g topically 4 (four) times daily.   IBUPROFEN (ADVIL) 800 MG TABLET    Take 1 tablet (800 mg total) by mouth every 8 (eight) hours as needed for moderate pain (pain score 4-6).   METHOCARBAMOL (ROBAXIN) 500 MG TABLET    Take 1 tablet (500 mg total) by mouth every 6 (six) hours as needed for muscle spasms.    Note:  This document was prepared using Dragon voice recognition software and may include unintentional dictation errors.  Alona Bene, MD, [facility] Emergency Medicine    Azalynn Maxim, Arlyss Repress, MD 01/28/23 [phone]

## 2023-05-29 ENCOUNTER — Ambulatory Visit
Admission: RE | Admit: 2023-05-29 | Discharge: 2023-05-29 | Disposition: A | Discharge status: Home / Self Care | Source: Ambulatory Visit | Attending: Family Medicine | Admitting: Family Medicine

## 2023-05-29 VITALS — BP 117/83 | HR 83 | Temp 98.1°F | Resp 14

## 2023-05-29 DIAGNOSIS — L309 Dermatitis, unspecified: Secondary | ICD-10-CM | POA: Diagnosis not present

## 2023-05-29 DIAGNOSIS — B353 Tinea pedis: Secondary | ICD-10-CM

## 2023-05-29 MED ORDER — CLOBETASOL PROPIONATE 0.05 % EX OINT: 1.0000 | TOPICAL_OINTMENT | Freq: Two times a day (BID) | CUTANEOUS | 0 refills | Status: DC

## 2023-05-29 MED ORDER — TERBINAFINE HCL 250 MG PO TABS: 250.0000 mg | ORAL_TABLET | Freq: Every day | ORAL | 0 refills | Status: DC

## 2023-05-29 MED ORDER — PREDNISONE 20 MG PO TABS: 40.0000 mg | ORAL_TABLET | Freq: Every day | ORAL | 0 refills | Status: AC

## 2023-05-29 NOTE — ED Triage Notes (Signed)
 Pt here for two separate skin concerns. Has an eczema flare up x2 weeks on bilateral arms and hands. No relief with eucerin lotion. Reports the heat usually causes these flares.   Also reports dry, itchy skin in between toes of both feet x10yr. She reports trying equate brand anit-fungal cream to area with no relief.

## 2023-05-29 NOTE — ED Provider Notes (Signed)
 [facility]    CSN: [id number] Arrival date & time: 05/29/23  0849      History   Chief Complaint Chief Complaint  Patient presents with   Rash    Entered by patient   Pruritis    HPI Sally Garner is a 38 y.o. female.    Rash  Patient is here for several rashes.   She has h/o eczema on her arms, which usually flares in the summer.  She has been using eucerin cream without help, and topical creams without help.  On her hands and arms bilaterally.   She also has a fungal rash inbetween her toes/feet.  Has been going on for over a year.  Using otc topicals without help.        Past Medical History:  Diagnosis Date   Anemia    Eczema    Hypertension    was put on medication end of 03/2015; states has caused headache and is not taking as prescribed   Migraines    Pre-diabetes    Status post repeat low transverse cesarean section 07/23/2017   Please schedule this patient for Postpartum visit in: 6 weeks with the following provider: Any provider For C/S patients schedule nurse incision check in weeks 2 weeks: yes High risk pregnancy complicated by: HTN Delivery mode:  CS Anticipated Birth Control:  BTL done PP PP Procedures needed: Incision check, BP check in 1 week Schedule Integrated BH visit: no    Status post tubal ligation 07/23/2017   Symptomatic cholelithiasis 04/2015    Patient Active Problem List   Diagnosis Date Noted   Gestational hypertension 04/25/2022   Uterine size-date discrepancy 04/25/2022   Muscle pain 04/25/2022   Iron deficiency anemia of pregnancy 04/25/2022   Gastroesophageal reflux disease 04/25/2022   Supervision of other normal pregnancy, antepartum 04/25/2022   H/O vaginal hysterectomy 09/16/2021   History of bilateral salpingectomy 09/16/2021   Post-operative state 08/03/2021   Elevated hemoglobin A1c 01/03/2017   Anemia 05/24/2013   Smoker 05/24/2013   Obesity, unspecified--BMI 37.1 05/24/2013   Hx of primary  hypertension--2-3 years ago 05/24/2013    Past Surgical History:  Procedure Laterality Date   CESAREAN SECTION N/A 05/25/2013   Procedure: CESAREAN SECTION;  Surgeon: Sharon Seller, DO;  Location: [facility];  Service: Obstetrics;  Laterality: N/A;   CESAREAN SECTION N/A 07/23/2017   Procedure: CESAREAN SECTION;  Surgeon: Reva Bores, MD;  Location: [facility];  Service: Obstetrics;  Laterality: N/A;   CHOLECYSTECTOMY N/A 05/05/2015   Procedure: LAPAROSCOPIC CHOLECYSTECTOMY;  Surgeon: Abigail Miyamoto, MD;  Location: [facility];  Service: General;  Laterality: N/A;   VAGINAL HYSTERECTOMY Bilateral 08/03/2021   Procedure: HYSTERECTOMY VAGINAL WITH BILATERAL SALPINGECTOMY;  Surgeon: Hermina Staggers, MD;  Location: [facility];  Service: Gynecology;  Laterality: Bilateral;    OB History     Gravida  2   Para  2   Term  2   Preterm      AB      Living  2      SAB      IAB      Ectopic      Multiple  0   Live Births  2            Home Medications    Prior to Admission medications   Medication Sig Start Date End Date Taking? Authorizing Provider  hydrochlorothiazide (HYDRODIURIL) 25 MG tablet Take 1 tablet (25 mg total) by  mouth daily. 12/02/22  Yes Georganna Skeans, MD  ibuprofen (ADVIL) 800 MG tablet Take 1 tablet (800 mg total) by mouth every 8 (eight) hours as needed for moderate pain (pain score 4-6). 01/28/23  Yes Long, Arlyss Repress, MD  azithromycin (ZITHROMAX) 250 MG tablet Take 1 tablet (250 mg total) by mouth daily. Take first 2 tablets together, then 1 every day until finished. Patient not taking: Reported on 05/29/2023 01/20/23   Garrison, [place] N, FNP  clobetasol ointment (TEMOVATE) 0.05 % Apply 1 Application topically 2 (two) times daily. Patient not taking: Reported on 12/02/2022 08/24/22   Achille Rich, PA-C  diclofenac Sodium (VOLTAREN) 1 % GEL Apply 2 g topically 4 (four) times daily. 01/28/23   Long, Arlyss Repress, MD  Iron-FA-B  Cmp-C-Biot-Probiotic (FUSION PLUS PO) Take 1 capsule by mouth every morning. Patient not taking: Reported on 05/29/2023    [provider]  methocarbamol (ROBAXIN) 500 MG tablet Take 1 tablet (500 mg total) by mouth every 6 (six) hours as needed for muscle spasms. 01/28/23   Long, Arlyss Repress, MD  Prenatal Vit-Fe Fumarate-FA (PRENATAL VITAMIN PO) 1 q day Patient not taking: Reported on 05/29/2023    [provider]  promethazine-dextromethorphan (PROMETHAZINE-DM) 6.25-15 MG/5ML syrup Take 5 mLs by mouth 4 (four) times daily as needed for cough. Patient not taking: Reported on 05/29/2023 01/20/23   Garrison, [place] N, FNP  tiZANidine (ZANAFLEX) 4 MG tablet Take 1 tablet (4 mg total) by mouth every 8 (eight) hours as needed for muscle spasms. Patient not taking: Reported on 12/02/2022 08/24/22   Achille Rich, PA-C  triamcinolone cream (KENALOG) 0.1 % Apply 1 Application topically 2 (two) times daily. Patient not taking: Reported on 12/02/2022 08/05/22   Garrison, [place] N, FNP  amLODipine (NORVASC) 10 MG tablet Take 1 tablet (10 mg total) by mouth daily. Patient not taking: Reported on 10/17/2017 07/25/17 01/10/20  Sharyon Cable, CNM  ferrous sulfate 325 (65 FE) MG tablet Take 1 tablet (325 mg total) by mouth 3 (three) times daily with meals. Patient not taking: Reported on 10/17/2017 07/25/17 01/10/20  Sharyon Cable, CNM    Family History Family History  Problem Relation Age of Onset   Birth defects Brother        polydactyl   Hypertension Maternal Aunt    Hypertension Maternal Uncle    Cancer Paternal Aunt        breast cancer    Social History Social History   Tobacco Use   Smoking status: Every Day    Current packs/day: 1.00    Average packs/day: 1 pack/day for 3.0 years (3.0 ttl pk-yrs)    Types: Cigarettes   Smokeless tobacco: Never  Vaping Use   Vaping status: Never Used  Substance Use Topics   Alcohol use: Not Currently   Drug use: No     Allergies    Patient has no known allergies.   Review of Systems Review of Systems  Constitutional: Negative.   HENT: Negative.    Respiratory: Negative.    Cardiovascular: Negative.   Gastrointestinal: Negative.   Musculoskeletal: Negative.   Skin:  Positive for rash.     Physical Exam Triage Vital Signs ED Triage Vitals [05/29/23 0908]  Encounter Vitals Group     BP 117/83     Systolic BP Percentile      Diastolic BP Percentile      Pulse Rate 83     Resp 14     Temp 98.1 F (36.7 C)  Temp Source Oral     SpO2 99 %     Weight      Height      Head Circumference      Peak Flow      Pain Score 3     Pain Loc      Pain Education      Exclude from Growth Chart    No data found.  Updated Vital Signs BP 117/83 (BP Location: Left Arm)   Pulse 83   Temp 98.1 F (36.7 C) (Oral)   Resp 14   LMP 06/27/2021   SpO2 99%   Visual Acuity Right Eye Distance:   Left Eye Distance:   Bilateral Distance:    Right Eye Near:   Left Eye Near:    Bilateral Near:     Physical Exam Constitutional:      Appearance: Normal appearance.  Skin:    Comments: Dry, slightly raised rash on her hands/palms/arms up to the shoulders bilaterally;   White discoloration between the toes;  slight erythema  Neurological:     General: No focal deficit present.     Mental Status: She is alert.  Psychiatric:        Mood and Affect: Mood normal.      UC Treatments / Results  Labs (all labs ordered are listed, but only abnormal results are displayed) Labs Reviewed - No data to display  EKG   Radiology No results found.  Procedures Procedures (including critical care time)  Medications Ordered in UC Medications - No data to display  Initial Impression / Assessment and Plan / UC Course  I have reviewed the triage vital signs and the nursing notes.  Pertinent labs & imaging results that were available during my care of the patient were reviewed by me and considered in my medical  decision making (see chart for details).   Final Clinical Impressions(s) / UC Diagnoses   Final diagnoses:  Eczema, unspecified type  Tinea pedis of both feet     Discharge Instructions      You were seen today for several rashes.  For your eczema, I have sent out an ointment, and oral steroid.  For your athletes foot, I have sent out an oral antifungal to take daily x 2 weeks.  You may continue over the counter topical antifungal cream as well daily.  Please follow up here or with a dermatologist if issues persist.     ED Prescriptions     Medication Sig Dispense Auth. Provider   clobetasol ointment (TEMOVATE) 0.05 % Apply 1 Application topically 2 (two) times daily. Apply to hands/arms 30 g Callaghan Laverdure, MD   predniSONE (DELTASONE) 20 MG tablet Take 2 tablets (40 mg total) by mouth daily for 5 days. 10 tablet Eliyanah Elgersma, MD   terbinafine (LAMISIL) 250 MG tablet Take 1 tablet (250 mg total) by mouth daily. 14 tablet Jannifer Franklin, MD      PDMP not reviewed this encounter.   Jannifer Franklin, MD 05/29/23 [phone]

## 2023-05-29 NOTE — Discharge Instructions (Signed)
 You were seen today for several rashes.  For your eczema, I have sent out an ointment, and oral steroid.  For your athletes foot, I have sent out an oral antifungal to take daily x 2 weeks.  You may continue over the counter topical antifungal cream as well daily.  Please follow up here or with a dermatologist if issues persist.

## 2023-06-02 ENCOUNTER — Ambulatory Visit: Payer: Medicaid Other | Admitting: Family Medicine

## 2023-06-12 ENCOUNTER — Ambulatory Visit (INDEPENDENT_AMBULATORY_CARE_PROVIDER_SITE_OTHER): Admitting: Family Medicine

## 2023-06-12 ENCOUNTER — Encounter: Payer: Self-pay | Admitting: Family Medicine

## 2023-06-12 VITALS — BP 113/80 | HR 89 | Temp 98.6°F | Resp 16 | Ht 64.5 in | Wt 241.6 lb

## 2023-06-12 DIAGNOSIS — Z6841 Body Mass Index (BMI) 40.0 and over, adult: Secondary | ICD-10-CM

## 2023-06-12 DIAGNOSIS — E66813 Obesity, class 3: Secondary | ICD-10-CM | POA: Diagnosis not present

## 2023-06-12 DIAGNOSIS — I10 Essential (primary) hypertension: Secondary | ICD-10-CM | POA: Diagnosis not present

## 2023-06-12 MED ORDER — HYDROCHLOROTHIAZIDE 25 MG PO TABS: 25.0000 mg | ORAL_TABLET | Freq: Every day | ORAL | 1 refills | Status: DC

## 2023-06-12 NOTE — Progress Notes (Signed)
 Established Patient Office Visit  Subjective    Patient ID: Sally Garner, female    DOB: 08/24/1985  Age: 38 y.o. MRN: [id number]  CC:  Chief Complaint  Patient presents with   Follow-up    6 month    HPI [name] presents for routine follow up of hypertension. Patient reports med compliance and denies acute complaints.   Outpatient Encounter Medications as of 06/12/2023  Medication Sig   clobetasol  ointment (TEMOVATE ) 0.05 % Apply 1 Application topically 2 (two) times daily. Apply to hands/arms   ibuprofen  (ADVIL ) 800 MG tablet Take 1 tablet (800 mg total) by mouth every 8 (eight) hours as needed for moderate pain (pain score 4-6).   methocarbamol  (ROBAXIN ) 500 MG tablet Take 1 tablet (500 mg total) by mouth every 6 (six) hours as needed for muscle spasms.   terbinafine  (LAMISIL ) 250 MG tablet Take 1 tablet (250 mg total) by mouth daily.   [DISCONTINUED] hydrochlorothiazide  (HYDRODIURIL ) 25 MG tablet Take 1 tablet (25 mg total) by mouth daily.   diclofenac  Sodium (VOLTAREN ) 1 % GEL Apply 2 g topically 4 (four) times daily. (Patient not taking: Reported on 06/12/2023)   hydrochlorothiazide  (HYDRODIURIL ) 25 MG tablet Take 1 tablet (25 mg total) by mouth daily.   [DISCONTINUED] amLODipine  (NORVASC ) 10 MG tablet Take 1 tablet (10 mg total) by mouth daily. (Patient not taking: Reported on 10/17/2017)   [DISCONTINUED] ferrous sulfate  325 (65 FE) MG tablet Take 1 tablet (325 mg total) by mouth 3 (three) times daily with meals. (Patient not taking: Reported on 10/17/2017)   No facility-administered encounter medications on file as of 06/12/2023.    Past Medical History:  Diagnosis Date   Anemia    Eczema    Hypertension    was put on medication end of 03/2015; states has caused headache and is not taking as prescribed   Migraines    Pre-diabetes    Status post repeat low transverse cesarean section 07/23/2017   Please schedule this patient for Postpartum visit in: 6 weeks with  the following provider: Any provider For C/S patients schedule nurse incision check in weeks 2 weeks: yes High risk pregnancy complicated by: HTN Delivery mode:  CS Anticipated Birth Control:  BTL done PP PP Procedures needed: Incision check, BP check in 1 week Schedule Integrated BH visit: no    Status post tubal ligation 07/23/2017   Symptomatic cholelithiasis 04/2015    Past Surgical History:  Procedure Laterality Date   CESAREAN SECTION N/A 05/25/2013   Procedure: CESAREAN SECTION;  Surgeon: Christel Cousins, DO;  Location: [facility];  Service: Obstetrics;  Laterality: N/A;   CESAREAN SECTION N/A 07/23/2017   Procedure: CESAREAN SECTION;  Surgeon: Granville Layer, MD;  Location: [facility];  Service: Obstetrics;  Laterality: N/A;   CHOLECYSTECTOMY N/A 05/05/2015   Procedure: LAPAROSCOPIC CHOLECYSTECTOMY;  Surgeon: Oza Blumenthal, MD;  Location: [facility];  Service: General;  Laterality: N/A;   VAGINAL HYSTERECTOMY Bilateral 08/03/2021   Procedure: HYSTERECTOMY VAGINAL WITH BILATERAL SALPINGECTOMY;  Surgeon: Othelia Blinks, MD;  Location: [facility];  Service: Gynecology;  Laterality: Bilateral;    Family History  Problem Relation Age of Onset   Birth defects Brother        polydactyl   Hypertension Maternal Aunt    Hypertension Maternal Uncle    Cancer Paternal Aunt        breast cancer    Social History   Socioeconomic History   Marital status: Single  Spouse name: Not on file   Number of children: Not on file   Years of education: Not on file   Highest education level: 12th grade  Occupational History   Not on file  Tobacco Use   Smoking status: Every Day    Current packs/day: 1.00    Average packs/day: 1 pack/day for 3.0 years (3.0 ttl pk-yrs)    Types: Cigarettes   Smokeless tobacco: Never  Vaping Use   Vaping status: Never Used  Substance and Sexual Activity   Alcohol use: Not Currently   Drug use: No   Sexual activity: Yes    Partners: Male     Birth control/protection: Surgical  Other Topics Concern   Not on file  Social History Narrative   Not on file   Social Drivers of Health   Financial Resource Strain: Low Risk  (06/08/2023)   Overall Financial Resource Strain (CARDIA)    Difficulty of Paying Living Expenses: Not very hard  Food Insecurity: No Food Insecurity (06/08/2023)   Hunger Vital Sign    Worried About Running Out of Food in the Last Year: Never true    Ran Out of Food in the Last Year: Never true  Transportation Needs: No Transportation Needs (06/08/2023)   PRAPARE - Administrator, Civil Service (Medical): No    Lack of Transportation (Non-Medical): No  Physical Activity: Sufficiently Active (06/08/2023)   Exercise Vital Sign    Days of Exercise per Week: 7 days    Minutes of Exercise per Session: 30 min  Stress: No Stress Concern Present (06/08/2023)   [employer] of Occupational Health - Occupational Stress Questionnaire    Feeling of Stress : Only a little  Social Connections: Socially Isolated (06/08/2023)   Social Connection and Isolation Panel [NHANES]    Frequency of Communication with Friends and Family: More than three times a week    Frequency of Social Gatherings with Friends and Family: More than three times a week    Attends Religious Services: Never    Database administrator or Organizations: No    Attends Engineer, structural: Not on file    Marital Status: Never married  Intimate Partner Violence: Not At Risk (12/02/2022)   Humiliation, Afraid, Rape, and Kick questionnaire    Fear of Current or Ex-Partner: No    Emotionally Abused: No    Physically Abused: No    Sexually Abused: No    Review of Systems  All other systems reviewed and are negative.       Objective    BP 113/80   Pulse 89   Temp 98.6 F (37 C) (Oral)   Resp 16   Ht 5' 4.5" (1.638 m)   Wt 241 lb 9.6 oz (109.6 kg)   LMP 06/27/2021   SpO2 97%   BMI 40.83 kg/m   Physical Exam Vitals  and nursing note reviewed.  Constitutional:      General: She is not in acute distress.    Appearance: She is obese.  HENT:     Head: Normocephalic and atraumatic.  Cardiovascular:     Rate and Rhythm: Normal rate and regular rhythm.  Pulmonary:     Effort: Pulmonary effort is normal.     Breath sounds: Normal breath sounds.  Abdominal:     Palpations: Abdomen is soft.     Tenderness: There is no abdominal tenderness.  Neurological:     General: No focal deficit present.  Mental Status: She is alert and oriented to person, place, and time.         Assessment & Plan:   Essential hypertension  Class 3 severe obesity due to excess calories with serious comorbidity and body mass index (BMI) of 40.0 to 44.9 in adult [facility])  Other orders -     hydroCHLOROthiazide ; Take 1 tablet (25 mg total) by mouth daily.  Dispense: 90 tablet; Refill: 1     Return in about 6 months (around 12/12/2023) for physical.   Arlo Lama, MD

## 2023-06-14 ENCOUNTER — Encounter: Payer: Self-pay | Admitting: Family Medicine

## 2023-06-17 IMAGING — US US PELVIS COMPLETE WITH TRANSVAGINAL
1 series · 15 of 25 positions shown · non-contrast
Comparison: None

CLINICAL DATA: Abnormal uterine bleeding since 4950, history of
Caesarean section x2, uterine fibroids. LMP 04/30/2021

EXAM:
TRANSABDOMINAL AND TRANSVAGINAL ULTRASOUND OF PELVIS
TECHNIQUE: Both transabdominal and transvaginal ultrasound examinations of the
pelvis were performed. Transabdominal technique was performed for
global imaging of the pelvis including uterus, ovaries, adnexal
regions, and pelvic cul-de-sac. It was necessary to proceed with
endovaginal exam following the transabdominal exam to visualize the
endometrium and RIGHT ovary.

[Series 1: us pelvis complete with transvaginal · 15 of 105 slices shown]
[im 1/105]
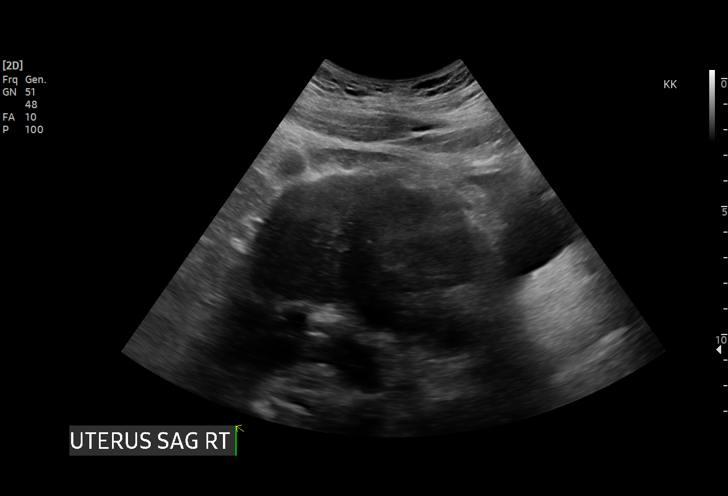
[im 9/105]
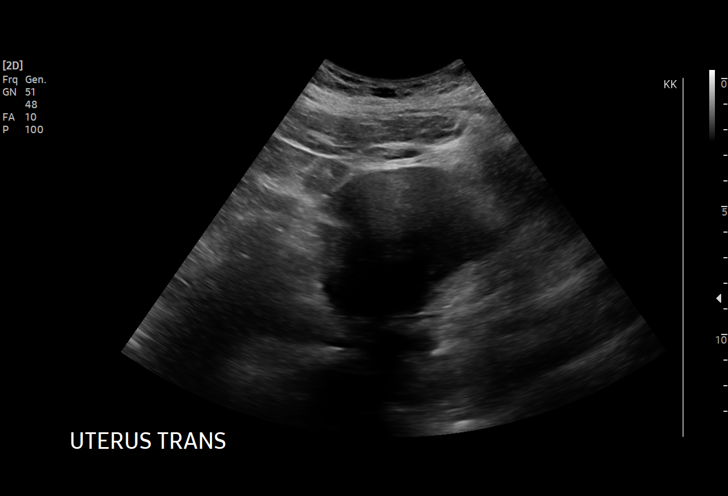
[im 18/105]
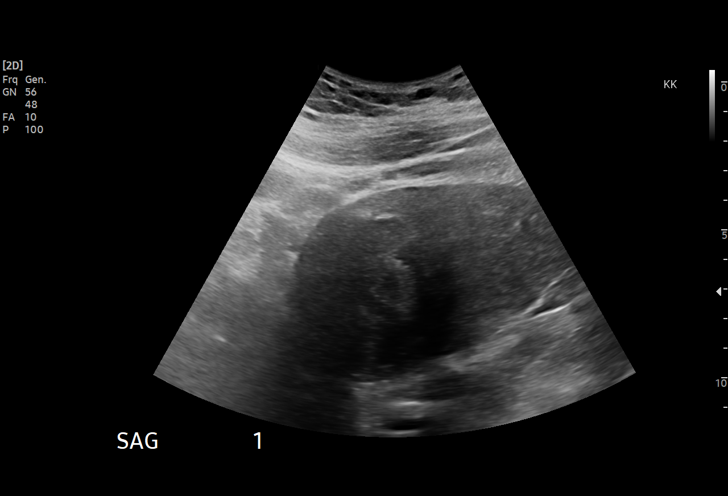
[im 22/105]
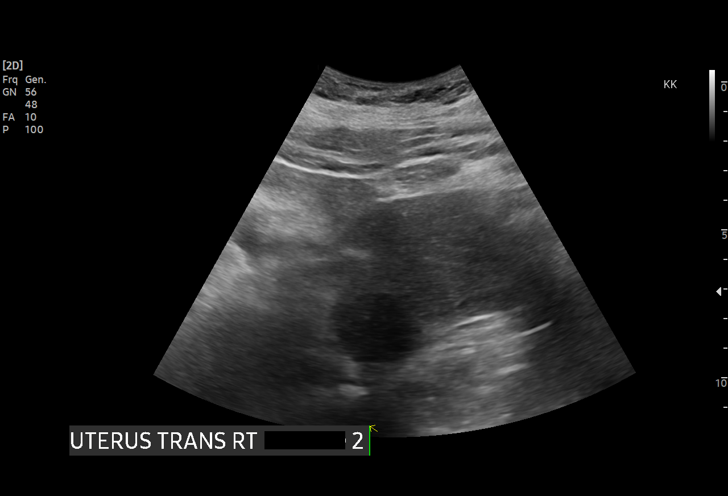
[im 31/105]
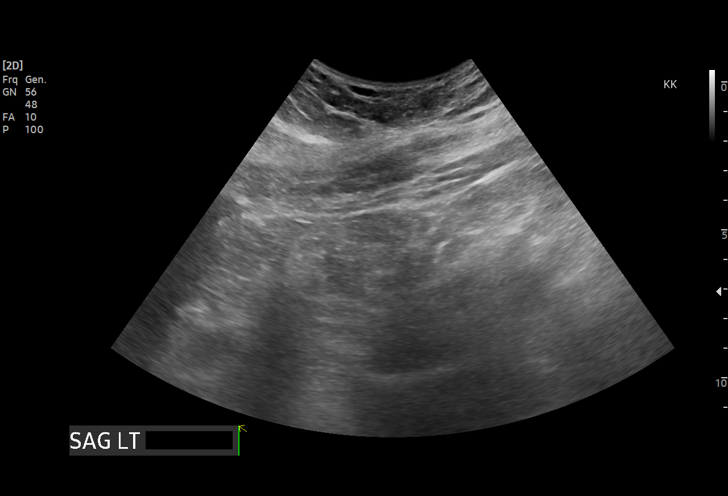
[im 40/105]
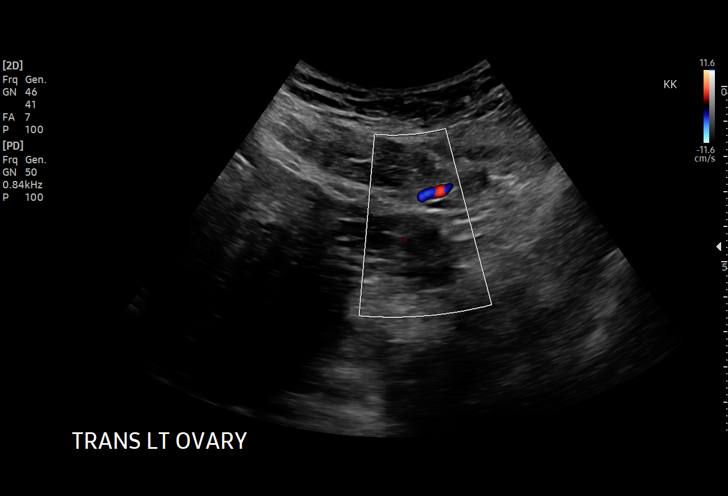
[im 44/105]
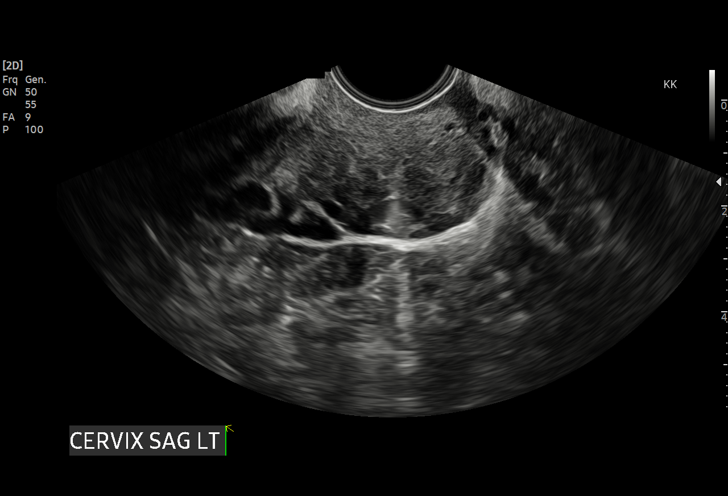
[im 53/105]
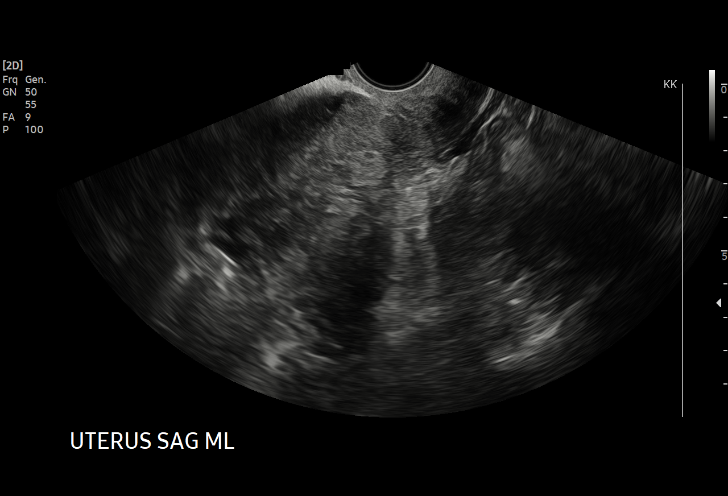
[im 61/105]
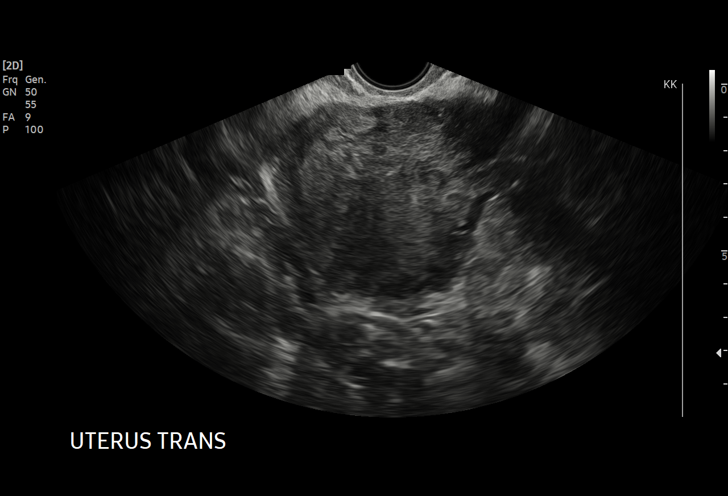
[im 66/105]
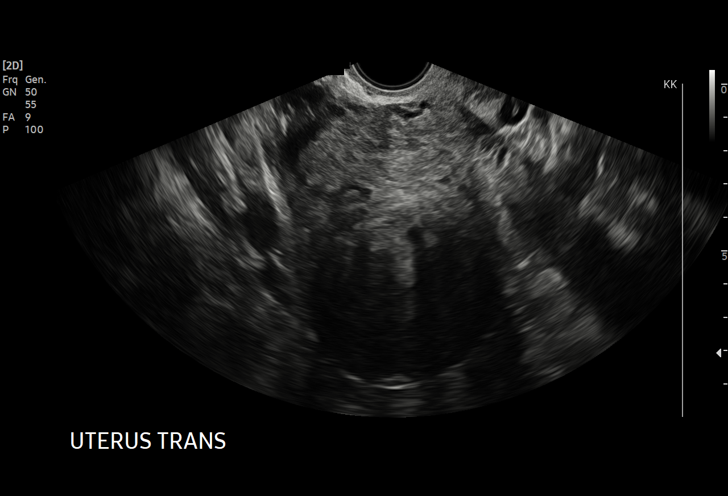
[im 74/105]
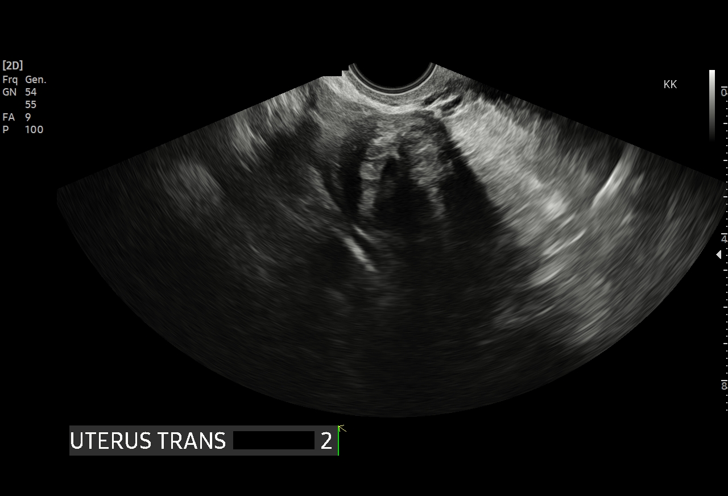
[im 83/105]
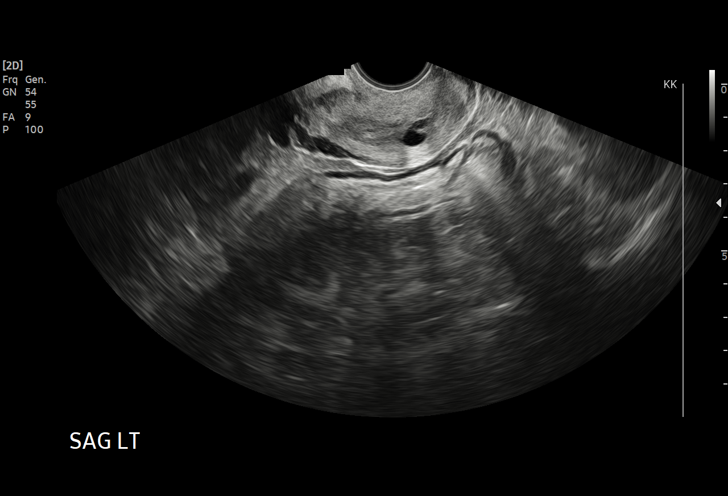
[im 87/105]
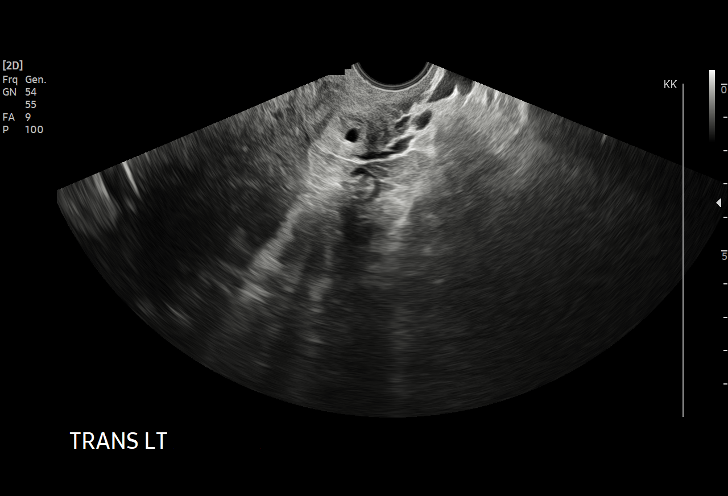
[im 96/105]
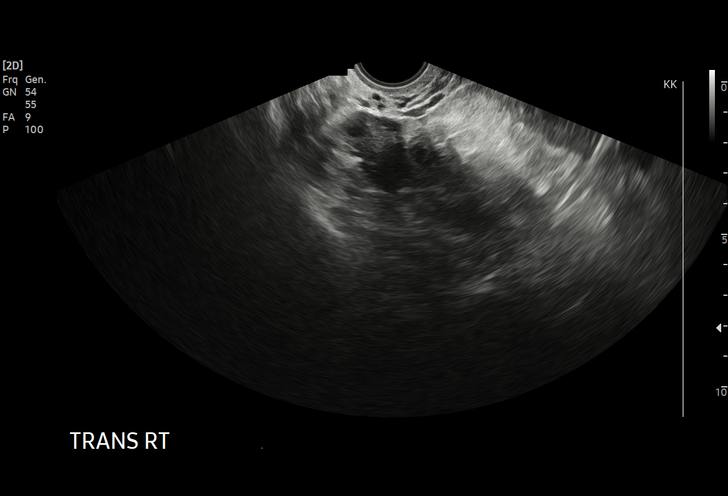
[im 105/105]
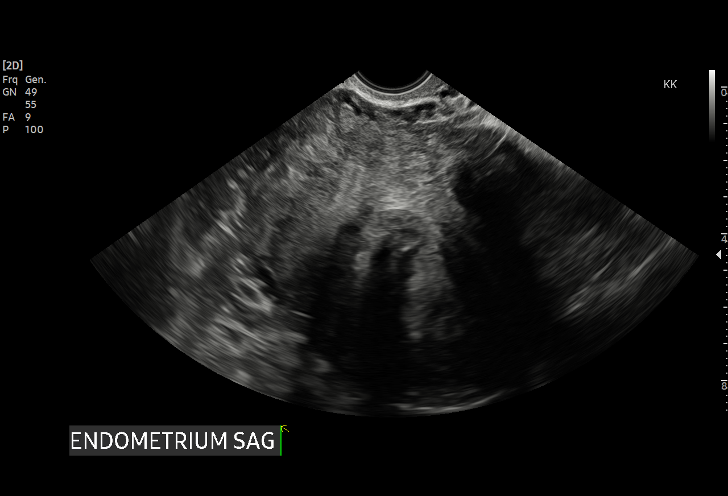

[15 of 25 positions shown; findings below may reference images not displayed]

FINDINGS: Uterus

Measurements: 10.3 x 7.2 x 6.1 cm = volume: 239 mL. Anteverted. At
least 2 large uterine leiomyomata are seen, 6.1 cm diameter
submucosal at fundus and 3.1 cm diameter subserosal posterior LEFT
uterus.

Endometrium

Thickness: 10 mm.  No endometrial fluid or mass

Right ovary

Measurements: 3.4 x 2.3 x 2.1 cm = volume: 8.3 mL. Normal morphology
without mass

Left ovary

Measurements: 3.1 x 1.1 x 1.4 cm = volume: 2.4 mL. Normal morphology
without mass

Other findings

No free pelvic fluid.  No adnexal masses.
IMPRESSION: Two uterine leiomyomata, larger of which 6.1 cm diameter is located
submucosal at fundus.

Unremarkable endometrial complex and ovaries.

## 2023-09-30 DIAGNOSIS — H5203 Hypermetropia, bilateral: Secondary | ICD-10-CM | POA: Diagnosis not present

## 2023-10-02 DIAGNOSIS — H5213 Myopia, bilateral: Secondary | ICD-10-CM | POA: Diagnosis not present

## 2023-10-30 DIAGNOSIS — H52223 Regular astigmatism, bilateral: Secondary | ICD-10-CM | POA: Diagnosis not present

## 2023-11-23 ENCOUNTER — Encounter: Payer: Self-pay | Admitting: Family Medicine

## 2023-11-26 ENCOUNTER — Emergency Department (HOSPITAL_COMMUNITY)
Admission: EM | Admit: 2023-11-26 | Discharge: 2023-11-26 | Disposition: A | Discharge status: Home / Self Care | Attending: Emergency Medicine | Admitting: Emergency Medicine

## 2023-11-26 ENCOUNTER — Encounter (HOSPITAL_COMMUNITY): Payer: Self-pay | Admitting: Emergency Medicine

## 2023-11-26 DIAGNOSIS — R519 Headache, unspecified: Secondary | ICD-10-CM | POA: Insufficient documentation

## 2023-11-26 DIAGNOSIS — J029 Acute pharyngitis, unspecified: Secondary | ICD-10-CM | POA: Insufficient documentation

## 2023-11-26 DIAGNOSIS — I10 Essential (primary) hypertension: Secondary | ICD-10-CM | POA: Insufficient documentation

## 2023-11-26 DIAGNOSIS — Z79899 Other long term (current) drug therapy: Secondary | ICD-10-CM | POA: Diagnosis not present

## 2023-11-26 DIAGNOSIS — K0889 Other specified disorders of teeth and supporting structures: Secondary | ICD-10-CM | POA: Diagnosis not present

## 2023-11-26 LAB — RESP PANEL BY RT-PCR (RSV, FLU A&B, COVID)  RVPGX2
Influenza A by PCR: NEGATIVE
Influenza B by PCR: NEGATIVE
Resp Syncytial Virus by PCR: NEGATIVE
SARS Coronavirus 2 by RT PCR: NEGATIVE

## 2023-11-26 LAB — GROUP A STREP BY PCR: Group A Strep by PCR: NOT DETECTED

## 2023-11-26 MED ORDER — IBUPROFEN 200 MG PO TABS
600.0000 mg | ORAL_TABLET | Freq: Once | ORAL | Status: AC
  Administered 2023-11-26: 600 mg via ORAL
  Filled 2023-11-26: qty 3

## 2023-11-26 MED ORDER — AMOXICILLIN-POT CLAVULANATE 875-125 MG PO TABS: 1.0000 | ORAL_TABLET | Freq: Two times a day (BID) | ORAL | 0 refills | Status: AC

## 2023-11-26 MED ORDER — NAPROXEN 500 MG PO TABS: 500.0000 mg | ORAL_TABLET | Freq: Two times a day (BID) | ORAL | 0 refills | Status: DC

## 2023-11-26 NOTE — ED Triage Notes (Signed)
 Pt arriving POV with upper left side facial pain x2 days. Pt reports she did have fillings placed on the same side approx 2 months ago. Denies ear pain. Productive cough with clear mucus.

## 2023-11-26 NOTE — ED Provider Notes (Signed)
 [place] EMERGENCY DEPARTMENT AT [facility] Provider Note   CSN: [id number] Arrival date & time: 11/26/23  9172     Patient presents with: Facial Pain (left)   Sally Garner is a 38 y.o. female with a past medical history significant for hypertension, anemia, tobacco abuse, and prediabetes who presents to the ED due to left-sided facial pain.  Patient notes she had dental work done roughly 1 month ago where she had a cracked tooth which was filled.  She notes she has been having dental pain ever since however, only typically occurs when chewing.  For the past 2 to 3 days she has been having some left-sided facial pain without chewing.  Pain radiates to underneath left-side of jaw.  Denies any facial edema.  No fever or chills.  No difficulties swallowing.  Does admit to a sore throat on the left side.  No changes to phonation.  Denies shortness of breath and chest pain.  History obtained from patient and past medical records. No interpreter used during encounter.       Prior to Admission medications   Medication Sig Start Date End Date Taking? Authorizing Provider  amoxicillin -clavulanate (AUGMENTIN ) 875-125 MG tablet Take 1 tablet by mouth every 12 (twelve) hours for 7 days. 11/26/23 12/03/23 Yes Antionetta Ator, Aleck BROCKS, PA-C  naproxen  (NAPROSYN ) 500 MG tablet Take 1 tablet (500 mg total) by mouth 2 (two) times daily. 11/26/23  Yes Prisca Gearing C, PA-C  clobetasol  ointment (TEMOVATE ) 0.05 % Apply 1 Application topically 2 (two) times daily. Apply to hands/arms 05/29/23   Darral Longs, MD  diclofenac  Sodium (VOLTAREN ) 1 % GEL Apply 2 g topically 4 (four) times daily. Patient not taking: Reported on 06/12/2023 01/28/23   Darra Fonda MATSU, MD  hydrochlorothiazide  (HYDRODIURIL ) 25 MG tablet Take 1 tablet (25 mg total) by mouth daily. 06/12/23   Tanda Bleacher, MD  ibuprofen  (ADVIL ) 800 MG tablet Take 1 tablet (800 mg total) by mouth every 8 (eight) hours as needed for moderate pain  (pain score 4-6). 01/28/23   Long, Fonda MATSU, MD  methocarbamol  (ROBAXIN ) 500 MG tablet Take 1 tablet (500 mg total) by mouth every 6 (six) hours as needed for muscle spasms. 01/28/23   Long, Fonda MATSU, MD  terbinafine  (LAMISIL ) 250 MG tablet Take 1 tablet (250 mg total) by mouth daily. 05/29/23   Piontek, Longs, MD  amLODipine  (NORVASC ) 10 MG tablet Take 1 tablet (10 mg total) by mouth daily. Patient not taking: Reported on 10/17/2017 07/25/17 01/10/20  Sharl Lucienne BROCKS, CNM  ferrous sulfate  325 (65 FE) MG tablet Take 1 tablet (325 mg total) by mouth 3 (three) times daily with meals. Patient not taking: Reported on 10/17/2017 07/25/17 01/10/20  Rogers, Veronica C, CNM    Allergies: Patient has no known allergies.    Review of Systems  Constitutional:  Negative for fever.  HENT:  Positive for sore throat. Negative for facial swelling, trouble swallowing and voice change.     Updated Vital Signs BP 131/88 (BP Location: Left Arm)   Pulse 88   Temp 98.6 F (37 C) (Oral)   Resp 16   LMP 06/27/2021   SpO2 100%   Physical Exam Vitals and nursing note reviewed.  Constitutional:      General: She is not in acute distress.    Appearance: She is not ill-appearing.  HENT:     Head: Normocephalic.     Mouth/Throat:     Comments: Good dentition.  No tenderness throughout left  upper lower molars.  No abscess.  Tongue in normal position without protrusion.  Tolerating oral secretions without difficulty.  Does have some erythema to left side of throat.  No tonsillar hypertrophy or exudates.  Airway patent. Eyes:     Pupils: Pupils are equal, round, and reactive to light.  Cardiovascular:     Rate and Rhythm: Normal rate and regular rhythm.     Pulses: Normal pulses.     Heart sounds: Normal heart sounds. No murmur heard.    No friction rub. No gallop.  Pulmonary:     Effort: Pulmonary effort is normal.     Breath sounds: Normal breath sounds.  Abdominal:     General: Abdomen is flat. There is no  distension.     Palpations: Abdomen is soft.     Tenderness: There is no abdominal tenderness. There is no guarding or rebound.  Musculoskeletal:        General: Normal range of motion.     Cervical back: Neck supple.  Skin:    General: Skin is warm and dry.  Neurological:     General: No focal deficit present.     Mental Status: She is alert.  Psychiatric:        Mood and Affect: Mood normal.        Behavior: Behavior normal.     (all labs ordered are listed, but only abnormal results are displayed) Labs Reviewed  GROUP A STREP BY PCR  RESP PANEL BY RT-PCR (RSV, FLU A&B, COVID)  RVPGX2    EKG: None  Radiology: No results found.   Procedures   Medications Ordered in the ED  ibuprofen  (ADVIL ) tablet 600 mg (600 mg Oral Given 11/26/23 0929)    Clinical Course as of 11/26/23 1041  Sun Nov 26, 2023  1019 Group A Strep by PCR: NOT DETECTED [CA]    Clinical Course User Index [CA] Lorelle Aleck BROCKS, PA-C                                 Medical Decision Making Amount and/or Complexity of Data Reviewed Labs:  Decision-making details documented in ED Course.  Risk OTC drugs. Prescription drug management.   This patient presents to the ED for concern of facial pain, this involves an extensive number of treatment options, and is a complaint that carries with it a high risk of complications and morbidity.  The differential diagnosis includes dental infection, strep throat, ludwig's, shingles, etc  38 year old female presents to the ED due to left-sided facial pain and sore throat x 2 days.  Had a cracked tooth filled roughly 1 month ago.  Also admits to a cough.  Son sick with similar symptoms.  Denies difficulties swallowing or changes to phonation.  Upon arrival, vitals all within normal limits.  Patient well-appearing on exam.  Good dentition throughout.  No visible abscess.  Has no facial edema.  Slight erythema to left side of throat without tonsillar hypertrophy or  exudates.  Uvula midline.  No abscess.  No trismus.  Strep test ordered.  RVP ordered.  Will treat with ibuprofen .  Low suspicion for Ludwig's or deep space infection.  No meningismus to suggest meningitis. No rash to suggest shingles.  Prep negative.  RVP negative.  Will discharge patient with antibiotics to cover for a dental infection and pain medication.  Advised patient to follow-up with her dentist for further evaluation.  No evidence of  deep space infection on exam.  Patient stable for discharge. Strict ED precautions discussed with patient. Patient states understanding and agrees to plan. Patient discharged home in no acute distress and stable vitals     Final diagnoses:  Facial pain    ED Discharge Orders          Ordered    amoxicillin -clavulanate (AUGMENTIN ) 875-125 MG tablet  Every 12 hours        11/26/23 1023    naproxen  (NAPROSYN ) 500 MG tablet  2 times daily        11/26/23 [address] PA-C 11/26/23 1042    Freddi Hamilton, MD 11/27/23 0745

## 2023-11-26 NOTE — Discharge Instructions (Addendum)
 It was a pleasure taking care of you today.  As discussed, I am sending you home with antibiotics.  Take twice a day for 7 days.  I am also sending you home with pain medication.  Take as needed for pain.  Your strep and COVID/flu test were negative.  Please follow-up with your dentist for further evaluation.  Return to the ER for any worsening symptoms.

## 2023-12-18 ENCOUNTER — Encounter: Admitting: Family Medicine

## 2023-12-26 ENCOUNTER — Emergency Department (HOSPITAL_COMMUNITY)
Admission: EM | Admit: 2023-12-26 | Discharge: 2023-12-27 | Disposition: A | Discharge status: Home / Self Care | Attending: Emergency Medicine | Admitting: Emergency Medicine

## 2023-12-26 ENCOUNTER — Other Ambulatory Visit: Payer: Self-pay

## 2023-12-26 ENCOUNTER — Emergency Department (HOSPITAL_COMMUNITY)

## 2023-12-26 ENCOUNTER — Encounter (HOSPITAL_COMMUNITY): Payer: Self-pay

## 2023-12-26 DIAGNOSIS — I10 Essential (primary) hypertension: Secondary | ICD-10-CM | POA: Diagnosis not present

## 2023-12-26 DIAGNOSIS — R112 Nausea with vomiting, unspecified: Secondary | ICD-10-CM | POA: Diagnosis not present

## 2023-12-26 DIAGNOSIS — R0981 Nasal congestion: Secondary | ICD-10-CM | POA: Insufficient documentation

## 2023-12-26 DIAGNOSIS — A599 Trichomoniasis, unspecified: Secondary | ICD-10-CM | POA: Insufficient documentation

## 2023-12-26 DIAGNOSIS — R1110 Vomiting, unspecified: Secondary | ICD-10-CM | POA: Diagnosis present

## 2023-12-26 DIAGNOSIS — J168 Pneumonia due to other specified infectious organisms: Secondary | ICD-10-CM | POA: Diagnosis not present

## 2023-12-26 DIAGNOSIS — Z79899 Other long term (current) drug therapy: Secondary | ICD-10-CM | POA: Diagnosis not present

## 2023-12-26 DIAGNOSIS — R051 Acute cough: Secondary | ICD-10-CM | POA: Diagnosis not present

## 2023-12-26 DIAGNOSIS — E876 Hypokalemia: Secondary | ICD-10-CM | POA: Diagnosis not present

## 2023-12-26 DIAGNOSIS — R7309 Other abnormal glucose: Secondary | ICD-10-CM | POA: Insufficient documentation

## 2023-12-26 DIAGNOSIS — J189 Pneumonia, unspecified organism: Secondary | ICD-10-CM

## 2023-12-26 DIAGNOSIS — R059 Cough, unspecified: Secondary | ICD-10-CM | POA: Diagnosis not present

## 2023-12-26 DIAGNOSIS — R918 Other nonspecific abnormal finding of lung field: Secondary | ICD-10-CM

## 2023-12-26 DIAGNOSIS — J984 Other disorders of lung: Secondary | ICD-10-CM | POA: Diagnosis not present

## 2023-12-26 LAB — RESP PANEL BY RT-PCR (RSV, FLU A&B, COVID)  RVPGX2
Influenza A by PCR: NEGATIVE
Influenza B by PCR: NEGATIVE
Resp Syncytial Virus by PCR: NEGATIVE
SARS Coronavirus 2 by RT PCR: NEGATIVE

## 2023-12-26 LAB — COMPREHENSIVE METABOLIC PANEL WITH GFR
ALT: 10 U/L (ref 0–44)
AST: 24 U/L (ref 15–41)
Albumin: 4 g/dL (ref 3.5–5.0)
Alkaline Phosphatase: 72 U/L (ref 38–126)
Anion gap: 11 (ref 5–15)
BUN: 6 mg/dL (ref 6–20)
CO2: 27 mmol/L (ref 22–32)
Calcium: 9.2 mg/dL (ref 8.9–10.3)
Chloride: 99 mmol/L (ref 98–111)
Creatinine, Ser: 0.67 mg/dL (ref 0.44–1.00)
GFR, Estimated: >60 mL/min (ref >60)
Glucose, Bld: 107 mg/dL — ABNORMAL HIGH (ref 70–99)
Potassium: 3.1 mmol/L — ABNORMAL LOW (ref 3.5–5.1)
Sodium: 137 mmol/L (ref 135–145)
Total Bilirubin: 0.3 mg/dL (ref 0.0–1.2)
Total Protein: 7.6 g/dL (ref 6.5–8.1)

## 2023-12-26 LAB — CBC
HCT: 38.4 % (ref 36.0–46.0)
Hemoglobin: 12.5 g/dL (ref 12.0–15.0)
MCH: 28.4 pg (ref 26.0–34.0)
MCHC: 32.6 g/dL (ref 30.0–36.0)
MCV: 87.3 fL (ref 80.0–100.0)
Platelets: 333 K/uL (ref 150–400)
RBC: 4.4 MIL/uL (ref 3.87–5.11)
RDW: 13 % (ref 11.5–15.5)
WBC: 7.7 K/uL (ref 4.0–10.5)
nRBC: 0 % (ref 0.0–0.2)

## 2023-12-26 LAB — URINALYSIS, ROUTINE W REFLEX MICROSCOPIC
Bilirubin Urine: NEGATIVE
Glucose, UA: NEGATIVE mg/dL
Hgb urine dipstick: NEGATIVE
Ketones, ur: NEGATIVE mg/dL
Nitrite: NEGATIVE
Protein, ur: NEGATIVE mg/dL
Specific Gravity, Urine: 1.013 (ref 1.005–1.030)
pH: 6 (ref 5.0–8.0)

## 2023-12-26 LAB — URINE DRUG SCREEN
Amphetamines: NEGATIVE
Barbiturates: NEGATIVE
Benzodiazepines: NEGATIVE
Cocaine: NEGATIVE
Fentanyl: NEGATIVE
Methadone Scn, Ur: NEGATIVE
Opiates: NEGATIVE
Tetrahydrocannabinol: NEGATIVE

## 2023-12-26 LAB — LIPASE, BLOOD: Lipase: 27 U/L (ref 11–51)

## 2023-12-26 LAB — PREGNANCY, URINE: Preg Test, Ur: NEGATIVE

## 2023-12-26 MED ORDER — AZITHROMYCIN 250 MG PO TABS: 250.0000 mg | ORAL_TABLET | Freq: Every day | ORAL | 0 refills | Status: AC

## 2023-12-26 MED ORDER — POTASSIUM CHLORIDE 20 MEQ PO PACK
40.0000 meq | PACK | Freq: Once | ORAL | Status: AC
  Administered 2023-12-27: 40 meq via ORAL
  Filled 2023-12-26: qty 2

## 2023-12-26 MED ORDER — AZITHROMYCIN 250 MG PO TABS: 500.0000 mg | ORAL_TABLET | Freq: Once | ORAL | Status: DC

## 2023-12-26 MED ORDER — METRONIDAZOLE 500 MG PO TABS: 500.0000 mg | ORAL_TABLET | Freq: Two times a day (BID) | ORAL | 0 refills | Status: DC

## 2023-12-26 MED ORDER — SODIUM CHLORIDE 0.9 % IV BOLUS
1000.0000 mL | Freq: Once | INTRAVENOUS | Status: AC
  Administered 2023-12-26: 1000 mL via INTRAVENOUS

## 2023-12-26 MED ORDER — AMOXICILLIN 500 MG PO CAPS
1000.0000 mg | ORAL_CAPSULE | Freq: Once | ORAL | Status: DC
Start: 2023-12-26 — End: 2023-12-26

## 2023-12-26 MED ORDER — ONDANSETRON 4 MG PO TBDP: 4.0000 mg | ORAL_TABLET | Freq: Three times a day (TID) | ORAL | 0 refills | Status: DC | PRN

## 2023-12-26 MED ORDER — POTASSIUM CHLORIDE CRYS ER 20 MEQ PO TBCR
40.0000 meq | EXTENDED_RELEASE_TABLET | Freq: Once | ORAL | Status: AC
  Administered 2023-12-26: 40 meq via ORAL
  Filled 2023-12-26: qty 2

## 2023-12-26 MED ORDER — METRONIDAZOLE 500 MG PO TABS: 500.0000 mg | ORAL_TABLET | Freq: Once | ORAL | Status: DC

## 2023-12-26 MED ORDER — SODIUM CHLORIDE 0.9 % IV SOLN
500.0000 mg | Freq: Once | INTRAVENOUS | Status: AC
  Administered 2023-12-27: 500 mg via INTRAVENOUS
  Filled 2023-12-26: qty 5

## 2023-12-26 MED ORDER — ONDANSETRON HCL 4 MG/2ML IJ SOLN
4.0000 mg | Freq: Once | INTRAMUSCULAR | Status: AC
  Administered 2023-12-26: 4 mg via INTRAVENOUS
  Filled 2023-12-26: qty 2

## 2023-12-26 NOTE — ED Provider Notes (Incomplete)
 [place] EMERGENCY DEPARTMENT AT [facility] Provider Note   CSN: [id number] Arrival date & time: 12/26/23  1732     Patient presents with: Vomiting   Sally Garner is a 38 y.o. female with past medical history of anemia, HTN, hysterectomy, IDA, migraines, gallstones presents emergency department for evaluation of productive cough, congestion for past three days.  Had 3 episodes of vomiting today that was precipitated by significant coughing.  Endorses throat stinging that worsens with coughing but no sore throat.  Has been able to keep fluids down.  No chest pain or shortness of breath.  Family with similar symptoms.  Denies abdominal pain, diarrhea, fever, chills  {Add pertinent medical, surgical, social history, OB history to HPI:32947} HPI     Prior to Admission medications   Medication Sig Start Date End Date Taking? Authorizing Provider  azithromycin  (ZITHROMAX ) 250 MG tablet Take 1 tablet (250 mg total) by mouth daily for 4 days. Take first 2 tablets together, then 1 every day until finished. 12/26/23 12/30/23 Yes Minnie Tinnie BRAVO, PA  metroNIDAZOLE  (FLAGYL ) 500 MG tablet Take 1 tablet (500 mg total) by mouth 2 (two) times daily. 12/26/23  Yes Minnie Tinnie BRAVO, PA  ondansetron  (ZOFRAN -ODT) 4 MG disintegrating tablet Take 1 tablet (4 mg total) by mouth every 8 (eight) hours as needed for nausea or vomiting. 12/26/23  Yes Minnie Tinnie BRAVO, PA  clobetasol  ointment (TEMOVATE ) 0.05 % Apply 1 Application topically 2 (two) times daily. Apply to hands/arms 05/29/23   Darral Longs, MD  diclofenac  Sodium (VOLTAREN ) 1 % GEL Apply 2 g topically 4 (four) times daily. Patient not taking: Reported on 06/12/2023 01/28/23   Darra Fonda MATSU, MD  hydrochlorothiazide  (HYDRODIURIL ) 25 MG tablet Take 1 tablet (25 mg total) by mouth daily. 06/12/23   Tanda Bleacher, MD  ibuprofen  (ADVIL ) 800 MG tablet Take 1 tablet (800 mg total) by mouth every 8 (eight) hours as needed for moderate pain (pain score  4-6). 01/28/23   Long, Fonda MATSU, MD  methocarbamol  (ROBAXIN ) 500 MG tablet Take 1 tablet (500 mg total) by mouth every 6 (six) hours as needed for muscle spasms. 01/28/23   Long, Fonda MATSU, MD  naproxen  (NAPROSYN ) 500 MG tablet Take 1 tablet (500 mg total) by mouth 2 (two) times daily. 11/26/23   Aberman, Caroline C, PA-C  terbinafine  (LAMISIL ) 250 MG tablet Take 1 tablet (250 mg total) by mouth daily. 05/29/23   Piontek, Longs, MD  amLODipine  (NORVASC ) 10 MG tablet Take 1 tablet (10 mg total) by mouth daily. Patient not taking: Reported on 10/17/2017 07/25/17 01/10/20  Sharl Lucienne BROCKS, CNM  ferrous sulfate  325 (65 FE) MG tablet Take 1 tablet (325 mg total) by mouth 3 (three) times daily with meals. Patient not taking: Reported on 10/17/2017 07/25/17 01/10/20  Rogers, Veronica C, CNM    Allergies: Patient has no known allergies.    Review of Systems  HENT:  Positive for congestion.     Updated Vital Signs BP (!) 137/97   Pulse 91   Temp 98.3 F (36.8 C)   Resp 18   Ht 5' 4 (1.626 m)   Wt 108.9 kg   LMP 06/27/2021   SpO2 98%   BMI 41.20 kg/m   Physical Exam Vitals and nursing note reviewed.  Constitutional:      General: She is not in acute distress.    Appearance: Normal appearance. She is not ill-appearing.  HENT:     Head: Normocephalic and atraumatic.  Right Ear: Tympanic membrane, ear canal and external ear normal.     Left Ear: Tympanic membrane, ear canal and external ear normal.     Mouth/Throat:     Lips: Pink.     Mouth: Mucous membranes are moist.     Pharynx: Uvula midline. No pharyngeal swelling, oropharyngeal exudate, posterior oropharyngeal erythema or uvula swelling.     Tonsils: No tonsillar exudate or tonsillar abscesses. 2+ on the right. 2+ on the left.     Comments: No submandibular swelling nor tenderness.  Swallows without difficulty.  No drooling Eyes:     General: No scleral icterus.       Right eye: No discharge.        Left eye: No discharge.      Conjunctiva/sclera: Conjunctivae normal.  Cardiovascular:     Rate and Rhythm: Normal rate.     Pulses: Normal pulses.  Pulmonary:     Effort: Pulmonary effort is normal. No respiratory distress.     Breath sounds: Normal breath sounds. No stridor. No wheezing or rhonchi.  Chest:     Chest wall: No tenderness.  Abdominal:     General: Bowel sounds are normal. There is no distension.     Palpations: Abdomen is soft. There is no mass.     Tenderness: There is no abdominal tenderness. There is no guarding or rebound.  Musculoskeletal:     Cervical back: Normal range of motion and neck supple. No rigidity or tenderness.  Lymphadenopathy:     Cervical: No cervical adenopathy.  Skin:    General: Skin is warm.     Capillary Refill: Capillary refill takes less than 2 seconds.     Coloration: Skin is not jaundiced or pale.  Neurological:     Mental Status: She is alert and oriented to person, place, and time. Mental status is at baseline.     (all labs ordered are listed, but only abnormal results are displayed) Labs Reviewed  COMPREHENSIVE METABOLIC PANEL WITH GFR - Abnormal; Notable for the following components:      Result Value   Potassium 3.1 (*)    Glucose, Bld 107 (*)    All other components within normal limits  URINALYSIS, ROUTINE W REFLEX MICROSCOPIC - Abnormal; Notable for the following components:   APPearance CLOUDY (*)    Leukocytes,Ua LARGE (*)    Bacteria, UA MANY (*)    Trichomonas, UA PRESENT (*)    All other components within normal limits  RESP PANEL BY RT-PCR (RSV, FLU A&B, COVID)  RVPGX2  LIPASE, BLOOD  CBC  URINE DRUG SCREEN  PREGNANCY, URINE    EKG: None  Radiology: DG Chest 2 View Result Date: 12/26/2023 EXAM: 2 VIEW(S) XRAY OF THE CHEST 12/26/2023 10:20:58 PM COMPARISON: Chest x-ray 01/10/2023. CLINICAL HISTORY: cough FINDINGS: LUNGS AND PLEURA: Bilateral hilar prominence with some streaky perihilar opacities. No pulmonary edema. No pleural  effusion. No pneumothorax. HEART AND MEDIASTINUM: The cardiac silhouette is unremarkable. Bilateral hilar prominence, as described in the LUNGS AND PLEURA section, contributes to the appearance of the mediastinal silhouette. BONES AND SOFT TISSUES: No acute osseous abnormality. IMPRESSION: 1. Bilateral hilar prominence with some streaky perihilar opacities. Findings may be related to reactive airway disease or infectious/inflammatory process. Follow-up PA and lateral chest x-ray recommended after acute symptoms have resolved to confirm resolution and to reassess hilar size. Electronically signed by: Greig Pique MD 12/26/2023 10:33 PM EST RP Workstation: [id number]     Medications Ordered in the ED  azithromycin  (ZITHROMAX ) 500 mg in sodium chloride  0.9 % 250 mL IVPB (has no administration in time range)  potassium chloride  SA (KLOR-CON  M) CR tablet 40 mEq (40 mEq Oral Given 12/26/23 2229)  sodium chloride  0.9 % bolus 1,000 mL (1,000 mLs Intravenous New Bag/Given 12/26/23 2231)  ondansetron  (ZOFRAN ) injection 4 mg (4 mg Intravenous Given 12/26/23 2330)      {Click here for ABCD2, HEART and other calculators REFRESH Note before signing:1}                              Medical Decision Making Amount and/or Complexity of Data Reviewed Labs: ordered. Radiology: ordered.  Risk Prescription drug management.   Patient presents to the ED for concern of cough, congestion, nausea, vomiting, this involves an extensive number of treatment options, and is a complaint that carries with it a high risk of complications and morbidity.  The differential diagnosis includes ***   Co morbidities that complicate the patient evaluation  ***   Additional history obtained:  Additional history obtained from Nursing and Outside Medical Records   External records from outside source obtained and reviewed including triage RN note,   Lab Tests:  I Ordered, and personally interpreted labs.  The pertinent  results include:   UA contaminated but with trichomonas hCG negative Potassium 3.1 CBG 107 No leukocytosis   Imaging Studies ordered:  I ordered imaging studies including CXR  I independently visualized and interpreted imaging which showed  Bilateral hilar prominence with some streaky perihilar opacities. Findings may be related to reactive airway disease or infectious/inflammatory process I agree with the radiologist interpretation    Medicines ordered and prescription drug management:  I ordered medication including zofran , IVF  for nausea, vomiting, hydration  Reevaluation of the patient after these medicines showed that the patient improved I have reviewed the patients home medicines and have made adjustments as needed    Problem List / ED Course:  NV Vital signs hemodynamically stable with no tachycardia no fever hCG negative Denied nausea upon HPI interview however when attempting to give patient potassium supplement p.o., she vomited them up immediately.  Did provide Zofran  for N/V.  Reordered potassium packets and will p.o. challenge patient prior to leaving Provide IVF for hydration No abdominal tenderness nor complaints of abdominal pain.  Low suspicion for acute or emergent intra-abdominal pathology.  Is passing gas and having regular bowel movements.  Low suspicion for obstruction, perforation  Hypokalemia 3 1 and likely secondary to vomiting Provided p.o. supplementation  Acute cough Nasal congestion Cough has been present for 3 days Chest x-ray notable for perihilar opacities and streaking.  Does have history of abnormal chest x-rays with similar vague findings.  Is a tobacco user but or COPD to her knowledge.  Will provide pulmonology follow-up for persistent abnormal chest x-rays.  As that would likely take multiple months, I did recommend patient to follow-up with PCP within the next month to ensure resolution and repeat chest x-ray Will provide azithromycin   in ED as well as 4-day prescription to cover for bronchitis and/or pneumonia caused by bacteria Maintaining oxygen saturation without supplementation Did have tachycardia in triage but has not had any tachycardia since even without intervention.  Was provided IVF for hydration, persistent vomiting but was not tachycardic even prior to this  Trichomonas Denies vaginal complaints and concerns for STDs Found incidentally on UA Provided prescription for metronidazole  Discussed importance of avoiding sexual intercourse till  she completes antibiotic and having partner tested     Dispostion:  After consideration of the diagnostic results and the patients response to treatment, I feel that the patent would benefit from outpatient management with treatment, PCP, pulmonology follow-up.   Discussed ED workup, disposition, return to ED precautions with patient who expresses understanding agrees with plan.  All questions answered to their satisfaction.  They are agreeable to plan.  Discharge instructions provided on paperwork  Discussed patient with Dr. Bari who reviewed ED workup and agrees with plan  Final diagnoses:  Trichomonas infection  Acute cough  Nasal congestion  Nausea and vomiting, unspecified vomiting type    ED Discharge Orders          Ordered    metroNIDAZOLE  (FLAGYL ) 500 MG tablet  2 times daily        12/26/23 2303    ondansetron  (ZOFRAN -ODT) 4 MG disintegrating tablet  Every 8 hours PRN        12/26/23 2344    azithromycin  (ZITHROMAX ) 250 MG tablet  Daily        12/26/23 2346

## 2023-12-26 NOTE — Discharge Instructions (Addendum)
 Thank you for letting us  evaluate you today.  You are negative for COVID, flu, RSV.  Your urine did not show any UTI but it did show trichomonas.  We have given you a prescription for Flagyl  for this. Your partner will also need to be treated for this. Do NOT drink alcohol while taking this medication.   I also sent Zofran  to your pharmacy for nausea, vomiting.  Your labs also showed decrease potassium which is likely secondary to vomiting.  Please make sure to drink plenty of fluids and remain adequately hydrated.  Return to Emergency Department if you experience intractable vomiting causing unable to keep down fluids, worsening symptoms.  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  Take the printed copy of your results to your primary care provider for follow-up as well.

## 2023-12-26 NOTE — ED Triage Notes (Signed)
 Pt to er, pt states that she is here for vomiting, states that she has had a cold for the past three days, but today it is worse, states that today she started vomiting and this brought her in.  Pt states that she also has some spots on her tongue. Pt talking in full sentences.

## 2023-12-26 NOTE — ED Provider Notes (Signed)
 [place] EMERGENCY DEPARTMENT AT [facility] Provider Note   CSN: [id number] Arrival date & time: 12/26/23  1732     Patient presents with: Vomiting   Sally Garner is a 38 y.o. female with past medical history of anemia, HTN, hysterectomy, IDA, migraines, gallstones presents emergency department for evaluation of productive cough, congestion for past three days.  Had 3 episodes of vomiting today that was precipitated by significant coughing.  Endorses throat stinging that worsens with coughing but no sore throat.  Has been able to keep fluids down.  No chest pain or shortness of breath.  Family with similar symptoms.  Denies abdominal pain, diarrhea, fever, chills   HPI     Prior to Admission medications   Medication Sig Start Date End Date Taking? Authorizing Provider  azithromycin  (ZITHROMAX ) 250 MG tablet Take 1 tablet (250 mg total) by mouth daily for 4 days. Take first 2 tablets together, then 1 every day until finished. 12/26/23 12/30/23 Yes Minnie Tinnie BRAVO, PA  metroNIDAZOLE  (FLAGYL ) 500 MG tablet Take 1 tablet (500 mg total) by mouth 2 (two) times daily. 12/26/23  Yes Minnie Tinnie BRAVO, PA  ondansetron  (ZOFRAN -ODT) 4 MG disintegrating tablet Take 1 tablet (4 mg total) by mouth every 8 (eight) hours as needed for nausea or vomiting. 12/26/23  Yes Minnie Tinnie BRAVO, PA  clobetasol  ointment (TEMOVATE ) 0.05 % Apply 1 Application topically 2 (two) times daily. Apply to hands/arms 05/29/23   Darral Longs, MD  diclofenac  Sodium (VOLTAREN ) 1 % GEL Apply 2 g topically 4 (four) times daily. Patient not taking: Reported on 06/12/2023 01/28/23   Darra Fonda MATSU, MD  hydrochlorothiazide  (HYDRODIURIL ) 25 MG tablet Take 1 tablet (25 mg total) by mouth daily. 06/12/23   Tanda Bleacher, MD  ibuprofen  (ADVIL ) 800 MG tablet Take 1 tablet (800 mg total) by mouth every 8 (eight) hours as needed for moderate pain (pain score 4-6). 01/28/23   Long, Fonda MATSU, MD  methocarbamol  (ROBAXIN ) 500 MG tablet  Take 1 tablet (500 mg total) by mouth every 6 (six) hours as needed for muscle spasms. 01/28/23   Long, Fonda MATSU, MD  naproxen  (NAPROSYN ) 500 MG tablet Take 1 tablet (500 mg total) by mouth 2 (two) times daily. 11/26/23   Lorelle Aleck BROCKS, PA-C  terbinafine  (LAMISIL ) 250 MG tablet Take 1 tablet (250 mg total) by mouth daily. 05/29/23   Piontek, Longs, MD  amLODipine  (NORVASC ) 10 MG tablet Take 1 tablet (10 mg total) by mouth daily. Patient not taking: Reported on 10/17/2017 07/25/17 01/10/20  Sharl Lucienne BROCKS, CNM  ferrous sulfate  325 (65 FE) MG tablet Take 1 tablet (325 mg total) by mouth 3 (three) times daily with meals. Patient not taking: Reported on 10/17/2017 07/25/17 01/10/20  Rogers, Veronica C, CNM    Allergies: Patient has no known allergies.    Review of Systems  HENT:  Positive for congestion.     Updated Vital Signs BP (!) 137/97   Pulse 91   Temp 98.3 F (36.8 C)   Resp 18   Ht 5' 4 (1.626 m)   Wt 108.9 kg   LMP 06/27/2021   SpO2 98%   BMI 41.20 kg/m   Physical Exam Vitals and nursing note reviewed.  Constitutional:      General: She is not in acute distress.    Appearance: Normal appearance. She is not ill-appearing.  HENT:     Head: Normocephalic and atraumatic.     Right Ear: Tympanic membrane, ear canal and  external ear normal.     Left Ear: Tympanic membrane, ear canal and external ear normal.     Mouth/Throat:     Lips: Pink.     Mouth: Mucous membranes are moist. No oral lesions.     Pharynx: Uvula midline. No pharyngeal swelling, oropharyngeal exudate, posterior oropharyngeal erythema or uvula swelling.     Tonsils: No tonsillar exudate or tonsillar abscesses. 2+ on the right. 2+ on the left.     Comments: No submandibular swelling nor tenderness.  Swallows without difficulty.  No drooling. No oral lesions  nor purpura in mouth. Eyes:     General: No scleral icterus.       Right eye: No discharge.        Left eye: No discharge.     Conjunctiva/sclera:  Conjunctivae normal.  Cardiovascular:     Rate and Rhythm: Normal rate.     Pulses: Normal pulses.  Pulmonary:     Effort: Pulmonary effort is normal. No respiratory distress.     Breath sounds: Normal breath sounds. No stridor. No wheezing or rhonchi.  Chest:     Chest wall: No tenderness.  Abdominal:     General: Bowel sounds are normal. There is no distension.     Palpations: Abdomen is soft. There is no mass.     Tenderness: There is no abdominal tenderness. There is no guarding or rebound.  Musculoskeletal:     Cervical back: Normal range of motion and neck supple. No rigidity or tenderness.  Lymphadenopathy:     Cervical: No cervical adenopathy.  Skin:    General: Skin is warm.     Capillary Refill: Capillary refill takes less than 2 seconds.     Coloration: Skin is not jaundiced or pale.  Neurological:     Mental Status: She is alert and oriented to person, place, and time. Mental status is at baseline.     (all labs ordered are listed, but only abnormal results are displayed) Labs Reviewed  COMPREHENSIVE METABOLIC PANEL WITH GFR - Abnormal; Notable for the following components:      Result Value   Potassium 3.1 (*)    Glucose, Bld 107 (*)    All other components within normal limits  URINALYSIS, ROUTINE W REFLEX MICROSCOPIC - Abnormal; Notable for the following components:   APPearance CLOUDY (*)    Leukocytes,Ua LARGE (*)    Bacteria, UA MANY (*)    Trichomonas, UA PRESENT (*)    All other components within normal limits  RESP PANEL BY RT-PCR (RSV, FLU A&B, COVID)  RVPGX2  LIPASE, BLOOD  CBC  URINE DRUG SCREEN  PREGNANCY, URINE    EKG: None  Radiology: DG Chest 2 View Result Date: 12/26/2023 EXAM: 2 VIEW(S) XRAY OF THE CHEST 12/26/2023 10:20:58 PM COMPARISON: Chest x-ray 01/10/2023. CLINICAL HISTORY: cough FINDINGS: LUNGS AND PLEURA: Bilateral hilar prominence with some streaky perihilar opacities. No pulmonary edema. No pleural effusion. No pneumothorax.  HEART AND MEDIASTINUM: The cardiac silhouette is unremarkable. Bilateral hilar prominence, as described in the LUNGS AND PLEURA section, contributes to the appearance of the mediastinal silhouette. BONES AND SOFT TISSUES: No acute osseous abnormality. IMPRESSION: 1. Bilateral hilar prominence with some streaky perihilar opacities. Findings may be related to reactive airway disease or infectious/inflammatory process. Follow-up PA and lateral chest x-ray recommended after acute symptoms have resolved to confirm resolution and to reassess hilar size. Electronically signed by: Greig Pique MD 12/26/2023 10:33 PM EST RP Workstation: [id number]     Medications Ordered  in the ED  azithromycin  (ZITHROMAX ) 500 mg in sodium chloride  0.9 % 250 mL IVPB (has no administration in time range)  potassium chloride  SA (KLOR-CON  M) CR tablet 40 mEq (40 mEq Oral Given 12/26/23 2229)  sodium chloride  0.9 % bolus 1,000 mL (1,000 mLs Intravenous New Bag/Given 12/26/23 2231)  ondansetron  (ZOFRAN ) injection 4 mg (4 mg Intravenous Given 12/26/23 2330)                                    Medical Decision Making Amount and/or Complexity of Data Reviewed Labs: ordered. Radiology: ordered.  Risk Prescription drug management.   Patient presents to the ED for concern of cough, congestion, nausea, vomiting, this involves an extensive number of treatment options, and is a complaint that carries with it a high risk of complications and morbidity.  The differential diagnosis includes viral URI, pneumonia, bronchitis, COVID, flu, RSV, COPD exacerbation, asthma exacerbation, gastroenteritis, dehydration, pregnancy.  Nonexhaustive list   Co morbidities that complicate the patient evaluation  none   Additional history obtained:  Additional history obtained from Nursing and Outside Medical Records   External records from outside source obtained and reviewed including triage RN note,   Lab Tests:  I Ordered, and  personally interpreted labs.  The pertinent results include:   UA contaminated but with trichomonas hCG negative Potassium 3.1 CBG 107 No leukocytosis   Imaging Studies ordered:  I ordered imaging studies including CXR  I independently visualized and interpreted imaging which showed  Bilateral hilar prominence with some streaky perihilar opacities. Findings may be related to reactive airway disease or infectious/inflammatory process I agree with the radiologist interpretation    Medicines ordered and prescription drug management:  I ordered medication including zofran , IVF  for nausea, vomiting, hydration  Reevaluation of the patient after these medicines showed that the patient improved I have reviewed the patients home medicines and have made adjustments as needed    Problem List / ED Course:  NV Vital signs hemodynamically stable with no tachycardia no fever hCG negative Provide IVF for hydration No abdominal tenderness nor complaints of abdominal pain.  Low suspicion for acute or emergent intra-abdominal pathology.  Is passing gas and having regular bowel movements.  Low suspicion for obstruction, perforation  Hypokalemia 3 1 and likely secondary to vomiting Denied nausea upon HPI interview however when attempting to give patient potassium supplement p.o., she vomited them up immediately.  Did provide Zofran  for N/V.  Reordered potassium packets and will p.o. challenge patient prior to leaving   Acute cough Nasal congestion Cough, congestion has been present for 3 days Chest x-ray notable for perihilar opacities and streaking.  Does have history of abnormal chest x-rays with similar vague findings.  Is a tobacco user but or COPD to her knowledge.  Will provide pulmonology follow-up for persistent abnormal chest x-rays.  As that would likely take multiple months, I did recommend patient to follow-up with PCP within the next month to ensure resolution and repeat chest  x-ray Will provide azithromycin  in ED as well as 4-day prescription to cover for bronchitis and/or pneumonia caused by bacteria Maintaining oxygen saturation without supplementation Did have tachycardia in triage but has not had any tachycardia since even without intervention.  Was provided IVF for hydration, persistent vomiting but was not tachycardic even prior to this Low suspicion for ACS with no complaints of chest pain or shortness of breath.  Patient  presents with infectious symptoms that are also similar to family at home Low suspicion for PE as she has remained nontachycardic without intervention with no hypoxia.  She has absolutely no risk factors.  No pedal edema nor hemoptysis. No hx of PE/DVT  Trichomonas Denies vaginal complaints and concerns for STDs Found incidentally on UA Provided prescription for metronidazole  Discussed importance of avoiding sexual intercourse till she completes antibiotic and having partner tested     Dispostion:  After consideration of the diagnostic results and the patients response to treatment, I feel that the patent would benefit from outpatient management with treatment, PCP, pulmonology follow-up as long as patient remains stable for DC and passes PO challenge.  Discussed ED workup, disposition, return to ED precautions with patient who expresses understanding agrees with plan.  All questions answered to their satisfaction.  They are agreeable to projected plan of DC pending completion of abx and po challenge  Sign out to Leita Chancy PA  Discussed patient with Dr. Bari who reviewed ED workup and agrees with plan  Final diagnoses:  Trichomonas infection  Acute cough  Nasal congestion  Nausea and vomiting, unspecified vomiting type    ED Discharge Orders          Ordered    metroNIDAZOLE  (FLAGYL ) 500 MG tablet  2 times daily        12/26/23 2303    ondansetron  (ZOFRAN -ODT) 4 MG disintegrating tablet  Every 8 hours PRN         12/26/23 2344    azithromycin  (ZITHROMAX ) 250 MG tablet  Daily        12/26/23 2346             Minnie Tinnie BRAVO, PA 12/27/23 0012    Bari Roxie HERO, DO 12/28/23 1531

## 2023-12-27 ENCOUNTER — Encounter: Payer: Self-pay | Admitting: Medical Oncology

## 2023-12-27 ENCOUNTER — Encounter (HOSPITAL_COMMUNITY): Payer: Self-pay

## 2023-12-27 ENCOUNTER — Emergency Department (HOSPITAL_COMMUNITY)

## 2023-12-27 DIAGNOSIS — J984 Other disorders of lung: Secondary | ICD-10-CM | POA: Diagnosis not present

## 2023-12-27 DIAGNOSIS — R918 Other nonspecific abnormal finding of lung field: Secondary | ICD-10-CM | POA: Diagnosis not present

## 2023-12-27 MED ORDER — IOHEXOL 350 MG/ML SOLN
100.0000 mL | Freq: Once | INTRAVENOUS | Status: AC | PRN
  Administered 2023-12-27: 100 mL via INTRAVENOUS

## 2023-12-27 NOTE — ED Provider Notes (Signed)
 38 yo female with cough and congestion x 3 days, with vomiting. Sick family members at home.  Vomiting after PO K, provided with additional dose. No urinary symptoms, rx for Flagyl  for her trich. Also zpack.  Physical Exam  BP 122/78 (BP Location: Left Arm)   Pulse 97   Temp 98.2 F (36.8 C) (Oral)   Resp 18   Ht 5' 4 (1.626 m)   Wt 108.9 kg   LMP 06/27/2021   SpO2 99%   BMI 41.20 kg/m   Physical Exam  Procedures  Procedures  ED Course / MDM    Medical Decision Making Amount and/or Complexity of Data Reviewed Labs: ordered. Radiology: ordered.  Risk Prescription drug management.  Chest x-ray reviewed with attending physician Dr. Griselda.  Concern for appearance for this 38 year old female with reported history of migraines, hypertension, anemia, prediabetes.  She is a non-smoker.  Prior chest x-ray revealed a fairly large left lower pulmonary nodule and recommended CT chest.  Discussed with patient, she was not aware of this, has not had this CT done outpatient.  She does have a primary care provider in the [facility] network.  CT chest with contrast was completed, is negative for PE however does show a hilar mass which is slightly increased in size compared to prior CT done in 2022 as well as multiple pulmonary nodules.  Patient is provided with ambulatory referral to hematology oncology for further evaluation of this.  She is scheduled to see her PCP in the coming week.  Encouraged to take results to PCP for follow-up.  She will be discharged as previously planned with antibiotics for pneumonia.       [name] LABOR, PA-C 12/27/23 0248    [name], MD 12/28/23 [name]

## 2023-12-30 ENCOUNTER — Encounter (HOSPITAL_COMMUNITY): Payer: Self-pay

## 2023-12-30 ENCOUNTER — Other Ambulatory Visit: Payer: Self-pay

## 2023-12-30 ENCOUNTER — Emergency Department (HOSPITAL_COMMUNITY)

## 2023-12-30 ENCOUNTER — Emergency Department (HOSPITAL_COMMUNITY)
Admission: EM | Admit: 2023-12-30 | Discharge: 2023-12-30 | Disposition: A | Discharge status: Home / Self Care | Attending: Emergency Medicine | Admitting: Emergency Medicine

## 2023-12-30 DIAGNOSIS — Z79899 Other long term (current) drug therapy: Secondary | ICD-10-CM | POA: Diagnosis not present

## 2023-12-30 DIAGNOSIS — I10 Essential (primary) hypertension: Secondary | ICD-10-CM | POA: Insufficient documentation

## 2023-12-30 DIAGNOSIS — M25571 Pain in right ankle and joints of right foot: Secondary | ICD-10-CM | POA: Diagnosis not present

## 2023-12-30 DIAGNOSIS — X501XXA Overexertion from prolonged static or awkward postures, initial encounter: Secondary | ICD-10-CM | POA: Diagnosis not present

## 2023-12-30 DIAGNOSIS — S99912A Unspecified injury of left ankle, initial encounter: Secondary | ICD-10-CM | POA: Diagnosis not present

## 2023-12-30 MED ORDER — ACETAMINOPHEN 500 MG PO TABS
1000.0000 mg | ORAL_TABLET | Freq: Once | ORAL | Status: AC
  Administered 2023-12-30: 1000 mg via ORAL
  Filled 2023-12-30: qty 2

## 2023-12-30 MED ORDER — IBUPROFEN 800 MG PO TABS
800.0000 mg | ORAL_TABLET | Freq: Once | ORAL | Status: AC
  Administered 2023-12-30: 800 mg via ORAL
  Filled 2023-12-30: qty 1

## 2023-12-30 MED ORDER — KETOROLAC TROMETHAMINE 60 MG/2ML IM SOLN
30.0000 mg | Freq: Once | INTRAMUSCULAR | Status: AC
  Administered 2023-12-30: 30 mg via INTRAMUSCULAR
  Filled 2023-12-30: qty 2

## 2023-12-30 NOTE — ED Triage Notes (Signed)
 Pt POV d/t steeping off the curb wrong and twisted left ankle.

## 2023-12-30 NOTE — ED Notes (Signed)
 Ice pack applied.

## 2023-12-30 NOTE — Discharge Instructions (Signed)
 Your x-ray was negative.  You likely have an ankle sprain.  I recommend using crutches over the next day.  You may bear weight on your ankle as tolerated, that means if it does not hurt you to walk on your ankle you may do so.  If it starts to hurt you, return to using crutches.  I recommend taking 400 mg of ibuprofen  every 6 hours, 1000 mg of Tylenol  every 8 hours.

## 2023-12-30 NOTE — ED Provider Notes (Signed)
 [place] EMERGENCY DEPARTMENT AT [facility] Provider Note   CSN: [id number] Arrival date & time: 12/30/23  2127     Patient presents with: Ankle Pain   Sally Garner is a 38 y.o. female.   This is a 38 year old female who is here today with right ankle pain.  She reports that she stepped on a curb, had an inversion of the right ankle.  She was ambulatory for a bit, but started to have worsening pain.  Otherwise healthy, history of hypertension.  History of partial hysterectomy.   Ankle Pain      Prior to Admission medications   Medication Sig Start Date End Date Taking? Authorizing Provider  azithromycin  (ZITHROMAX ) 250 MG tablet Take 1 tablet (250 mg total) by mouth daily for 4 days. Take first 2 tablets together, then 1 every day until finished. 12/26/23 12/30/23  Minnie Tinnie BRAVO, PA  clobetasol  ointment (TEMOVATE ) 0.05 % Apply 1 Application topically 2 (two) times daily. Apply to hands/arms 05/29/23   Darral Longs, MD  diclofenac  Sodium (VOLTAREN ) 1 % GEL Apply 2 g topically 4 (four) times daily. Patient not taking: Reported on 06/12/2023 01/28/23   Darra Fonda MATSU, MD  hydrochlorothiazide  (HYDRODIURIL ) 25 MG tablet Take 1 tablet (25 mg total) by mouth daily. 06/12/23   Tanda Bleacher, MD  ibuprofen  (ADVIL ) 800 MG tablet Take 1 tablet (800 mg total) by mouth every 8 (eight) hours as needed for moderate pain (pain score 4-6). 01/28/23   Long, Fonda MATSU, MD  methocarbamol  (ROBAXIN ) 500 MG tablet Take 1 tablet (500 mg total) by mouth every 6 (six) hours as needed for muscle spasms. 01/28/23   Long, Fonda MATSU, MD  metroNIDAZOLE  (FLAGYL ) 500 MG tablet Take 1 tablet (500 mg total) by mouth 2 (two) times daily. 12/26/23   Minnie Tinnie BRAVO, PA  naproxen  (NAPROSYN ) 500 MG tablet Take 1 tablet (500 mg total) by mouth 2 (two) times daily. 11/26/23   Aberman, Caroline C, PA-C  ondansetron  (ZOFRAN -ODT) 4 MG disintegrating tablet Take 1 tablet (4 mg total) by mouth every 8 (eight) hours as  needed for nausea or vomiting. 12/26/23   Minnie Tinnie BRAVO, PA  terbinafine  (LAMISIL ) 250 MG tablet Take 1 tablet (250 mg total) by mouth daily. 05/29/23   Piontek, Longs, MD  amLODipine  (NORVASC ) 10 MG tablet Take 1 tablet (10 mg total) by mouth daily. Patient not taking: Reported on 10/17/2017 07/25/17 01/10/20  Sharl Lucienne BROCKS, CNM  ferrous sulfate  325 (65 FE) MG tablet Take 1 tablet (325 mg total) by mouth 3 (three) times daily with meals. Patient not taking: Reported on 10/17/2017 07/25/17 01/10/20  Rogers, Veronica C, CNM    Allergies: Patient has no known allergies.    Review of Systems  Updated Vital Signs BP (!) 146/106 (BP Location: Left Arm)   Pulse (!) 115   Temp 97.7 F (36.5 C) (Oral)   Resp 18   Ht 5' 4 (1.626 m)   Wt 108.9 kg   LMP 06/27/2021   SpO2 100%   BMI 41.20 kg/m   Physical Exam Vitals and nursing note reviewed.  Musculoskeletal:     Comments: Tenderness to palpation, swelling of the bilateral malleoli.  No tenderness over the fifth metatarsal.  Patient with pain with active and passive range of motion.  Neurological:     Mental Status: She is alert.     (all labs ordered are listed, but only abnormal results are displayed) Labs Reviewed - No data to display  EKG: None  Radiology: DG Ankle Complete Left Result Date: 12/30/2023 EXAM: 3 OR MORE VIEW(S) XRAY OF THE LEFT ANKLE 12/30/2023 09:52:00 PM CLINICAL HISTORY: injury COMPARISON: None available. FINDINGS: BONES AND JOINTS: No acute fracture. No joint dislocation. SOFT TISSUES: Mild soft tissue swelling. IMPRESSION: 1. No acute osseous abnormality. 2. Mild soft tissue swelling. Electronically signed by: Oneil Devonshire MD 12/30/2023 09:58 PM EST RP Workstation: [id number]     Procedures   Medications Ordered in the ED  ibuprofen  (ADVIL ) tablet 800 mg (800 mg Oral Given 12/30/23 2140)  acetaminophen  (TYLENOL ) tablet 1,000 mg (1,000 mg Oral Given 12/30/23 2159)  ketorolac  (TORADOL ) injection 30 mg (30 mg  Intramuscular Given 12/30/23 2159)                                    Medical Decision Making 38 year old female here today with ankle pain.  Differential diagnoses include ankle sprain, ankle fracture.  Plan-obtain plain films of the patient's ankle.  Mild swelling, but no gross deformity.  Good perfusion in the toes.  Reassessment 10:05 PM-patient's plain films negative.  I provided patient with some crutches.  Aware of the patient's tachycardia to 115.  Believe this is likely secondary to pain.  She has received some Tylenol  and Toradol .  Will discharge.  Amount and/or Complexity of Data Reviewed Radiology: ordered.  Risk OTC drugs. Prescription drug management.        Final diagnoses:  Acute right ankle pain    ED Discharge Orders     None          Mannie Fairy DASEN, DO 12/30/23 2207

## 2024-01-01 ENCOUNTER — Inpatient Hospital Stay: Attending: Physician Assistant | Admitting: Physician Assistant

## 2024-01-01 ENCOUNTER — Inpatient Hospital Stay

## 2024-01-01 ENCOUNTER — Encounter: Payer: Self-pay | Admitting: Medical Oncology

## 2024-01-01 VITALS — BP 129/93 | HR 98 | Temp 98.3°F | Resp 17 | Ht 64.0 in | Wt 243.0 lb

## 2024-01-01 DIAGNOSIS — R911 Solitary pulmonary nodule: Secondary | ICD-10-CM | POA: Diagnosis not present

## 2024-01-01 DIAGNOSIS — Z8249 Family history of ischemic heart disease and other diseases of the circulatory system: Secondary | ICD-10-CM | POA: Diagnosis not present

## 2024-01-01 DIAGNOSIS — F17210 Nicotine dependence, cigarettes, uncomplicated: Secondary | ICD-10-CM | POA: Insufficient documentation

## 2024-01-01 DIAGNOSIS — Z90711 Acquired absence of uterus with remaining cervical stump: Secondary | ICD-10-CM | POA: Diagnosis not present

## 2024-01-01 DIAGNOSIS — R918 Other nonspecific abnormal finding of lung field: Secondary | ICD-10-CM | POA: Diagnosis not present

## 2024-01-01 DIAGNOSIS — Z79899 Other long term (current) drug therapy: Secondary | ICD-10-CM | POA: Diagnosis not present

## 2024-01-01 DIAGNOSIS — I10 Essential (primary) hypertension: Secondary | ICD-10-CM | POA: Diagnosis not present

## 2024-01-01 DIAGNOSIS — D649 Anemia, unspecified: Secondary | ICD-10-CM

## 2024-01-01 DIAGNOSIS — R1110 Vomiting, unspecified: Secondary | ICD-10-CM | POA: Insufficient documentation

## 2024-01-01 DIAGNOSIS — K219 Gastro-esophageal reflux disease without esophagitis: Secondary | ICD-10-CM | POA: Insufficient documentation

## 2024-01-01 DIAGNOSIS — R590 Localized enlarged lymph nodes: Secondary | ICD-10-CM | POA: Insufficient documentation

## 2024-01-01 DIAGNOSIS — Z803 Family history of malignant neoplasm of breast: Secondary | ICD-10-CM | POA: Insufficient documentation

## 2024-01-01 DIAGNOSIS — Z604 Social exclusion and rejection: Secondary | ICD-10-CM | POA: Insufficient documentation

## 2024-01-01 DIAGNOSIS — J189 Pneumonia, unspecified organism: Secondary | ICD-10-CM | POA: Insufficient documentation

## 2024-01-01 DIAGNOSIS — Z9079 Acquired absence of other genital organ(s): Secondary | ICD-10-CM | POA: Diagnosis not present

## 2024-01-01 DIAGNOSIS — Z9049 Acquired absence of other specified parts of digestive tract: Secondary | ICD-10-CM | POA: Insufficient documentation

## 2024-01-01 LAB — CBC WITH DIFFERENTIAL (CANCER CENTER ONLY)
Abs Immature Granulocytes: 0.03 K/uL (ref 0.00–0.07)
Basophils Absolute: 0 K/uL (ref 0.0–0.1)
Basophils Relative: 0 %
Eosinophils Absolute: 0.2 K/uL (ref 0.0–0.5)
Eosinophils Relative: 2 %
HCT: 37.7 % (ref 36.0–46.0)
Hemoglobin: 13.1 g/dL (ref 12.0–15.0)
Immature Granulocytes: 0 %
Lymphocytes Relative: 40 %
Lymphs Abs: 2.9 K/uL (ref 0.7–4.0)
MCH: 29.6 pg (ref 26.0–34.0)
MCHC: 34.7 g/dL (ref 30.0–36.0)
MCV: 85.3 fL (ref 80.0–100.0)
Monocytes Absolute: 0.5 K/uL (ref 0.1–1.0)
Monocytes Relative: 8 %
Neutro Abs: 3.6 K/uL (ref 1.7–7.7)
Neutrophils Relative %: 50 %
Platelet Count: 269 K/uL (ref 150–400)
RBC: 4.42 MIL/uL (ref 3.87–5.11)
RDW: 13.2 % (ref 11.5–15.5)
WBC Count: 7.2 K/uL (ref 4.0–10.5)
nRBC: 0 % (ref 0.0–0.2)

## 2024-01-01 LAB — TSH: TSH: 0.976 u[IU]/mL (ref 0.350–4.500)

## 2024-01-01 LAB — LACTATE DEHYDROGENASE: LDH: 200 U/L — ABNORMAL HIGH (ref 98–192)

## 2024-01-01 LAB — CMP (CANCER CENTER ONLY)
ALT: 14 U/L (ref 0–44)
AST: 20 U/L (ref 15–41)
Albumin: 4.1 g/dL (ref 3.5–5.0)
Alkaline Phosphatase: 61 U/L (ref 38–126)
Anion gap: 7 (ref 5–15)
BUN: 12 mg/dL (ref 6–20)
CO2: 31 mmol/L (ref 22–32)
Calcium: 9.6 mg/dL (ref 8.9–10.3)
Chloride: 100 mmol/L (ref 98–111)
Creatinine: 1.01 mg/dL — ABNORMAL HIGH (ref 0.44–1.00)
GFR, Estimated: >60 mL/min (ref >60)
Glucose, Bld: 110 mg/dL — ABNORMAL HIGH (ref 70–99)
Potassium: 3.2 mmol/L — ABNORMAL LOW (ref 3.5–5.1)
Sodium: 138 mmol/L (ref 135–145)
Total Bilirubin: 0.2 mg/dL (ref 0.0–1.2)
Total Protein: 7.6 g/dL (ref 6.5–8.1)

## 2024-01-01 LAB — C-REACTIVE PROTEIN: CRP: 2.2 mg/dL — ABNORMAL HIGH (ref <1.0)

## 2024-01-01 LAB — T4, FREE: Free T4: 0.88 ng/dL (ref 0.61–1.12)

## 2024-01-01 LAB — SEDIMENTATION RATE: Sed Rate: 44 mm/h — ABNORMAL HIGH (ref 0–22)

## 2024-01-01 NOTE — Patient Instructions (Signed)
 [facility] Office Visit Discharge Information and Instructions  Thank you for choosing [facility] for your healthcare needs.  Below is a summary of today's discussion, along with our contact information and an outline of what to expect next.  Reason for Visit:  hilar mass  Proposed Diagnostic Care Plan: Labs collected today PET scan ordered. Once approved by your insurance you will be contacted by a member of our scheduling department.    What to Expect: - Generally, when lab tests are ordered the results can take up to 1 week for results to be available.  At that point, we will contact you to discuss your results with you.  Unless there is a critical result, we will typically wait for all of your lab results to be available before contacting you. - If a biopsy is part of your Care Plan, those results can take on average 7-10 days to result.  Once results are available, we will contact you to discuss your pathology results and any next steps. - If you have additional imaging ordered, such as a CT Scan, MRI, Ultrasound, Bone Scan, or PET scan, your imaging will need to be authorized then scheduled with the earliest available appointment.  You may be asked to travel to another hospital within [facility] who has a sooner availability, please consider doing so if asked. - If you use MyChart, your results will be available to you in the MyChart portal.  Your provider will be in touch with you as soon as all of your results are available to be discussed.  Your Diagnostic Clinic Provider:  Kyrell Ruacho Walisiewicz PA-C and Dr. Federico Your Diagnostic Navigator:  Colene Raider RN, office number [phone]  If you or your caregiver have number blocking on your cell phones, please ensure the cancer center's numbers are not blocked.  If you are not a registered MyChart user, please consider enrolling in MyChart to receive your test results and visit notes.  You can also access your  discharge instructions electronically.  MyChart also gives you an electronic means to communicate with your Care Team instead of needing to call in to the cancer center.  We appreciate you trusting us  with your healthcare and look forward to partnering with you as we work to uncover what your potential diagnosis may be.  Please do not hesitate to reach out at any point with questions or concerns.

## 2024-01-01 NOTE — Progress Notes (Signed)
 [facility] [phone]   [phone]  INITIAL CONSULTATION:  Patient Care Team: Tanda Bleacher, MD as PCP - General (Family Medicine) Golden Forestine BROCKS, RN as Oncology Nurse Navigator (Medical Oncology)  CHIEF COMPLAINTS/PURPOSE OF CONSULTATION:  Hilar mass   HISTORY OF PRESENTING ILLNESS:  Sally Garner 37 y.o. female with medical history significant for GERD, hypertension, anemia, partal hysterectomy, bronchitis.   On review of the previous records patient is being referred by ED provider Leita Chancy, PA-C.  Patient was evaluated in the ED ED on 12/26/2023 for vomiting and acute cough.  Workup was significant for chest x-ray showing perihilar opacities and streaking.  CT angio chest subsequently performed showing low-attenuation suprahilar soft tissue mass which extends to the level of the right hilum, mildly increased since prior study which was January 19, 2021.  Mass measures 2.1 cm x 1.5 cm x 2.9 cm and previously was 1.4 cm x 1.1 cm x 2.3 cm.  Also stable, likely benign 4 mm posterior Dorough lateral left upper lobe pulmonary nodule and 4 mm ill-defined pulmonary nodules within the superior segment of the right lower lobe and posterior left lower lobe.  Patient was discharged from the ED with treatment for pneumonia of azithromycin .  Patient also being treated with metronidazole  for incidental finding of trichomonas on UA. Patient had negative urine pregnancy testing in the ED as well.  On exam today patient presents unaccompanied.  Patient states that she is feeling improved since ED visit and she has finished her antibiotic.  She admits that she still has intermittent cough however regnancy has decreased.  Cough is not productive.  She does admit to having hot flashes however this has been ongoing since her partial hysterectomy and is unchanged currently.  She denies any difficulty breathing or swallowing, also denies  chest pain.  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  She rarely consumes alcohol.  She has smoked a pack a day x 12 years.  Family history includes a paternal aunt with breast cancer.  Patient is not up-to-date on her Pap smears however plans to see GYN in the near future.    MEDICAL HISTORY:  Past Medical History:  Diagnosis Date   Anemia    Eczema    Hypertension    was put on medication end of 03/2015; states has caused headache and is not taking as prescribed   Migraines    Pre-diabetes    Status post repeat low transverse cesarean section 07/23/2017   Please schedule this patient for Postpartum visit in: 6 weeks with the following provider: Any provider For C/S patients schedule nurse incision check in weeks 2 weeks: yes High risk pregnancy complicated by: HTN Delivery mode:  CS Anticipated Birth Control:  BTL done PP PP Procedures needed: Incision check, BP check in 1 week Schedule Integrated BH visit: no    Status post tubal ligation 07/23/2017   Symptomatic cholelithiasis 04/2015    SURGICAL HISTORY: Past Surgical History:  Procedure Laterality Date   CESAREAN SECTION N/A 05/25/2013   Procedure: CESAREAN SECTION;  Surgeon: Delon CHRISTELLA Prude, DO;  Location: [facility];  Service: Obstetrics;  Laterality: N/A;   CESAREAN SECTION N/A 07/23/2017   Procedure: CESAREAN SECTION;  Surgeon: Fredirick Glenys RAMAN, MD;  Location: [facility];  Service: Obstetrics;  Laterality: N/A;   CHOLECYSTECTOMY N/A 05/05/2015   Procedure: LAPAROSCOPIC CHOLECYSTECTOMY;  Surgeon: Vicenta Poli, MD;  Location: [facility];  Service:  General;  Laterality: N/A;   VAGINAL HYSTERECTOMY Bilateral 08/03/2021   Procedure: HYSTERECTOMY VAGINAL WITH BILATERAL SALPINGECTOMY;  Surgeon: Lorence Ozell CROME, MD;  Location: [facility];  Service: Gynecology;  Laterality: Bilateral;    SOCIAL HISTORY: Social History   Socioeconomic History   Marital status: Single     Spouse name: Not on file   Number of children: Not on file   Years of education: Not on file   Highest education level: 12th grade  Occupational History   Not on file  Tobacco Use   Smoking status: Every Day    Current packs/day: 1.00    Average packs/day: 1 pack/day for 3.0 years (3.0 ttl pk-yrs)    Types: Cigarettes   Smokeless tobacco: Never  Vaping Use   Vaping status: Never Used  Substance and Sexual Activity   Alcohol use: Not Currently   Drug use: No   Sexual activity: Yes    Partners: Male    Birth control/protection: Surgical  Other Topics Concern   Not on file  Social History Narrative   Not on file   Social Drivers of Health   Financial Resource Strain: Low Risk  (06/08/2023)   Overall Financial Resource Strain (CARDIA)    Difficulty of Paying Living Expenses: Not very hard  Food Insecurity: No Food Insecurity (12/28/2023)   Hunger Vital Sign    Worried About Running Out of Food in the Last Year: Never true    Ran Out of Food in the Last Year: Never true  Transportation Needs: No Transportation Needs (12/28/2023)   PRAPARE - Administrator, Civil Service (Medical): No    Lack of Transportation (Non-Medical): No  Physical Activity: Sufficiently Active (06/08/2023)   Exercise Vital Sign    Days of Exercise per Week: 7 days    Minutes of Exercise per Session: 30 min  Stress: No Stress Concern Present (06/08/2023)   [employer] of Occupational Health - Occupational Stress Questionnaire    Feeling of Stress : Only a little  Social Connections: Socially Isolated (06/08/2023)   Social Connection and Isolation Panel    Frequency of Communication with Friends and Family: More than three times a week    Frequency of Social Gatherings with Friends and Family: More than three times a week    Attends Religious Services: Never    Database Administrator or Organizations: No    Attends Engineer, Structural: Not on file    Marital Status: Never  married  Intimate Partner Violence: Not At Risk (12/02/2022)   Humiliation, Afraid, Rape, and Kick questionnaire    Fear of Current or Ex-Partner: No    Emotionally Abused: No    Physically Abused: No    Sexually Abused: No    FAMILY HISTORY: Family History  Problem Relation Age of Onset   Birth defects Brother        polydactyl   Hypertension Maternal Aunt    Hypertension Maternal Uncle    Cancer Paternal Aunt        breast cancer    ALLERGIES:  has no known allergies.  MEDICATIONS:  Current Outpatient Medications  Medication Sig Dispense Refill   clobetasol  ointment (TEMOVATE ) 0.05 % Apply 1 Application topically 2 (two) times daily. Apply to hands/arms 30 g 0   diclofenac  Sodium (VOLTAREN ) 1 % GEL Apply 2 g topically 4 (four) times daily. (Patient not taking: Reported on 06/12/2023) 100 g 0   hydrochlorothiazide  (HYDRODIURIL ) 25 MG tablet Take 1 tablet (  25 mg total) by mouth daily. 90 tablet 1   ibuprofen  (ADVIL ) 800 MG tablet Take 1 tablet (800 mg total) by mouth every 8 (eight) hours as needed for moderate pain (pain score 4-6). 21 tablet 0   methocarbamol  (ROBAXIN ) 500 MG tablet Take 1 tablet (500 mg total) by mouth every 6 (six) hours as needed for muscle spasms. 20 tablet 0   metroNIDAZOLE  (FLAGYL ) 500 MG tablet Take 1 tablet (500 mg total) by mouth 2 (two) times daily. 14 tablet 0   naproxen  (NAPROSYN ) 500 MG tablet Take 1 tablet (500 mg total) by mouth 2 (two) times daily. 30 tablet 0   ondansetron  (ZOFRAN -ODT) 4 MG disintegrating tablet Take 1 tablet (4 mg total) by mouth every 8 (eight) hours as needed for nausea or vomiting. 20 tablet 0   terbinafine  (LAMISIL ) 250 MG tablet Take 1 tablet (250 mg total) by mouth daily. 14 tablet 0   No current facility-administered medications for this visit.    REVIEW OF SYSTEMS:   All other systems are reviewed and are negative for acute change except as noted in the HPI.  PHYSICAL EXAMINATION: ECOG PERFORMANCE STATUS: 1 -  Symptomatic but completely ambulatory  Vitals:   01/01/24 1229  BP: (!) 129/93  Pulse: 98  Resp: 17  Temp: 98.3 F (36.8 C)  SpO2: 100%   Filed Weights   01/01/24 1229  Weight: 243 lb (110.2 kg)    Physical Exam Vitals and nursing note reviewed.  Constitutional:      Appearance: She is not ill-appearing or toxic-appearing.  HENT:     Head: Normocephalic and atraumatic.     Right Ear: External ear normal.     Left Ear: External ear normal.     Nose: Nose normal.     Mouth/Throat:     Mouth: Mucous membranes are moist.  Eyes:     General: No scleral icterus. Cardiovascular:     Rate and Rhythm: Normal rate and regular rhythm.     Pulses: Normal pulses.     Heart sounds: Normal heart sounds.  Pulmonary:     Effort: Pulmonary effort is normal. No respiratory distress.     Breath sounds: Normal breath sounds. No stridor. No wheezing, rhonchi or rales.  Chest:     Chest wall: No tenderness.  Abdominal:     General: There is no distension.  Musculoskeletal:        General: Normal range of motion.     Cervical back: Normal range of motion and neck supple.  Skin:    General: Skin is warm and dry.     Capillary Refill: Capillary refill takes less than 2 seconds.  Neurological:     Mental Status: She is alert.  Psychiatric:        Mood and Affect: Affect is tearful.       LABORATORY DATA:  I have reviewed the data as listed    Latest Ref Rng & Units 12/26/2023    7:13 PM 12/22/2021    6:10 PM 08/04/2021   12:19 AM  CBC  WBC 4.0 - 10.5 K/uL 7.7  8.2  11.0   Hemoglobin 12.0 - 15.0 g/dL 87.4  88.7  89.6   Hematocrit 36.0 - 46.0 % 38.4  34.8  32.1   Platelets 150 - 400 K/uL 333  296  318        Latest Ref Rng & Units 12/26/2023    7:13 PM 12/22/2021    6:10 PM 08/04/2021  12:19 AM  CMP  Glucose 70 - 99 mg/dL 892  870  875   BUN 6 - 20 mg/dL 6  5  5    Creatinine 0.44 - 1.00 mg/dL 9.32  9.12  9.10   Sodium 135 - 145 mmol/L 137  138  134   Potassium 3.5 - 5.1  mmol/L 3.1  3.4  3.8   Chloride 98 - 111 mmol/L 99  103  103   CO2 22 - 32 mmol/L 27  22  25    Calcium  8.9 - 10.3 mg/dL 9.2  8.9  8.4   Total Protein 6.5 - 8.1 g/dL 7.6     Total Bilirubin 0.0 - 1.2 mg/dL 0.3     Alkaline Phos 38 - 126 U/L 72     AST 15 - 41 U/L 24     ALT 0 - 44 U/L 10        RADIOGRAPHIC STUDIES: I have personally reviewed the radiological images as listed and agreed with the findings in the report. DG Ankle Complete Left Result Date: 12/30/2023 EXAM: 3 OR MORE VIEW(S) XRAY OF THE LEFT ANKLE 12/30/2023 09:52:00 PM CLINICAL HISTORY: injury COMPARISON: None available. FINDINGS: BONES AND JOINTS: No acute fracture. No joint dislocation. SOFT TISSUES: Mild soft tissue swelling. IMPRESSION: 1. No acute osseous abnormality. 2. Mild soft tissue swelling. Electronically signed by: Oneil Devonshire MD 12/30/2023 09:58 PM EST RP Workstation: [id number]   CT Angio Chest PE W/Cm &/Or Wo Cm Result Date: 12/27/2023 CLINICAL DATA:  Cold symptoms for 3 days with nausea and vomiting. EXAM: CT ANGIOGRAPHY CHEST WITH CONTRAST TECHNIQUE: Multidetector CT imaging of the chest was performed using the standard protocol during bolus administration of intravenous contrast. Multiplanar CT image reconstructions and MIPs were obtained to evaluate the vascular anatomy. RADIATION DOSE REDUCTION: This exam was performed according to the departmental dose-optimization program which includes automated exposure control, adjustment of the mA and/or kV according to patient size and/or use of iterative reconstruction technique. CONTRAST:  OMNIPAQUE  IOHEXOL  350 MG/ML SOLN COMPARISON:  January 19, 2021 FINDINGS: Cardiovascular: Satisfactory opacification of the pulmonary arteries to the segmental level. No evidence of pulmonary embolism. Normal heart size. No pericardial effusion. Mediastinum/Nodes: There is stable right hilar lymphadenopathy. Thyroid  gland, trachea, and esophagus demonstrate no significant  findings. Lungs/Pleura: There is a 2.1 cm x 1.5 cm x 2.9 cm low-attenuation (approximately 49.55 Hounsfield units) suprahilar soft tissue mass which extends to the level of the right hilum (axial CT images 40 through 50, CT series 4. This is present on the prior study and measured 1.4 cm x 1.1 cm x 2.3 cm on the prior study. Mild, hazy appearing areas of ground-glass appearing lung parenchyma are seen within the bilateral upper lobes. Mild right middle lobe infiltrate is seen. Very mild, slightly nodular appearing right upper lobe infiltrate is also noted. A stable, likely benign 4 mm posterolateral left upper lobe pulmonary nodule is seen (axial CT image 52, CT series 12). There is an ill-defined 3 mm posterior right upper lobe pulmonary nodule (axial CT image 59, CT series 12). An additional 4 mm ill-defined pulmonary nodule is seen within the adjacent portion of the superior segment of the right lower lobe. A 4 mm posterior left lower lobe pulmonary nodule is present (axial CT image 59, CT series 12). No pleural effusion or pneumothorax is identified. Upper Abdomen: Surgical clips are seen within the gallbladder fossa. Musculoskeletal: No chest wall abnormality. No acute or significant osseous findings. Review of the MIP  images confirms the above findings. IMPRESSION: 1. No evidence of pulmonary embolism. 2. Mild right middle lobe and slightly nodular appearing right upper lobe infiltrates. 3. Mild, hazy appearing areas of ground-glass appearing lung parenchyma within the bilateral upper lobes, which may represent mild pulmonary edema. 4. Low-attenuation suprahilar soft tissue mass which extends to the level of the right hilum, mildly increased in size since the prior study. While this may represent interval increase in right hilar lymphadenopathy, further evaluation with a nuclear medicine PET/CT is recommended to further exclude the presence of an underlying neoplastic process. 5. Stable, likely benign 4 mm  posterolateral left upper lobe pulmonary nodule. 6. Additional 4 mm ill-defined pulmonary nodules within the superior segment of the right lower lobe and posterior left lower lobe. No follow-up needed if patient is low-risk.This recommendation follows the consensus statement: Guidelines for Management of Incidental Pulmonary Nodules Detected on CT Images: From the Fleischner Society 2017; Radiology 2017; 284:228-243. 7. Evidence of prior cholecystectomy. Electronically Signed   By: Suzen Dials M.D.   On: 12/27/2023 01:54   DG Chest 2 View Result Date: 12/26/2023 EXAM: 2 VIEW(S) XRAY OF THE CHEST 12/26/2023 10:20:58 PM COMPARISON: Chest x-ray 01/10/2023. CLINICAL HISTORY: cough FINDINGS: LUNGS AND PLEURA: Bilateral hilar prominence with some streaky perihilar opacities. No pulmonary edema. No pleural effusion. No pneumothorax. HEART AND MEDIASTINUM: The cardiac silhouette is unremarkable. Bilateral hilar prominence, as described in the LUNGS AND PLEURA section, contributes to the appearance of the mediastinal silhouette. BONES AND SOFT TISSUES: No acute osseous abnormality. IMPRESSION: 1. Bilateral hilar prominence with some streaky perihilar opacities. Findings may be related to reactive airway disease or infectious/inflammatory process. Follow-up PA and lateral chest x-ray recommended after acute symptoms have resolved to confirm resolution and to reassess hilar size. Electronically signed by: Greig Pique MD 12/26/2023 10:33 PM EST RP Workstation: [id number]    ASSESSMENT & PLAN Sally Garner is a 38 y.o. female presenting to [facility] for consultation regarding hilar mass. Patient will proceed with laboratory workup today. Differential diagnosis includes benign and malignant tumor, infectious vs inflammatory lesion.  #Hilar mass - Reviewed ED work up with patient. - Labs to collect today include CBC, CMP, LDH, AFP, beta-HCG, TSH, T3 and T4. Will also order inflammatory  markers. - Will order PET scan for further evaluation. If FDG avid will referral to cardiothoracic surgery to discuss biopsy vs resection.  #Age related screenings - Patient plans to follow up with GYN for cervical cancer screenings.  -Patient will RTC when work up is complete.  Patient expressed understanding of the recommended workup and is agreeable to move forward.   All questions were answered. The patient knows to call the clinic with any problems, questions or concerns.  Shared visit with Dr. Federico.  Orders Placed This Encounter  Procedures   NM PET Image Initial (PI) Skull Base To Thigh    [facility]    Standing Status:   Future    Expected Date:   01/02/2024    Expiration Date:   12/31/2024    If indicated for the ordered procedure, I authorize the administration of a radiopharmaceutical per Radiology protocol:   Yes    Is the patient pregnant?:   No    Preferred imaging location?:   [facility]   CBC with Differential (Cancer Center Only)    Standing Status:   Future    Expected Date:   01/01/2024    Expiration Date:   12/31/2024   CMP (Cancer  Center only)    Standing Status:   Future    Expected Date:   01/01/2024    Expiration Date:   12/31/2024   TSH    Standing Status:   Future    Expected Date:   01/01/2024    Expiration Date:   12/31/2024   T3, free    Standing Status:   Future    Expected Date:   01/01/2024    Expiration Date:   12/31/2024   T4, free    Standing Status:   Future    Expected Date:   01/01/2024    Expiration Date:   12/31/2024   Beta hCG quant (ref lab)    Standing Status:   Future    Expected Date:   01/01/2024    Expiration Date:   12/31/2024   AFP tumor marker    Standing Status:   Future    Expected Date:   01/01/2024    Expiration Date:   12/31/2024   Lactate dehydrogenase    Standing Status:   Future    Expected Date:   01/01/2024    Expiration Date:   12/31/2024   Sedimentation rate    Standing Status:    Future    Expiration Date:   12/31/2024   C-reactive protein    Standing Status:   Future    Expiration Date:   12/31/2024      I have spent a total of 60 minutes minutes of face-to-face and non-face-to-face time, preparing to see the patient, obtaining and/or reviewing separately obtained history, performing a medically appropriate examination, counseling and educating the patient, ordering medications/tests/procedures, referring and communicating with other health care professionals, documenting clinical information in the electronic health record, independently interpreting results and communicating results to the patient, and care coordination.   Shenia Alan Walisiewicz PA-C Department of Hematology/Oncology [facility] at [facility] Phone: [phone]  I have read the above note and personally examined the patient. I agree with the assessment and plan as noted above.  Briefly Mrs. Guillermo Difrancesco is a 38 year old female who presents for evaluation of a hilar mass.  On 12/27/2023 the patient underwent a CT angio of the chest which revealed a low-attenuation suprahilar soft tissue mass which extends into the level of the right hilum, mildly increased in size from prior studies.  It was measured at 2.1 x 1.5 x 2.9 cm.  Previously it measured 1.4 x 1.1 x 2.3 cm.  The patient has been otherwise asymptomatic with some occasional episodes of coughing.  She reports she has had no weight gain or fevers, chills, sweats, nausea, vomiting or diarrhea.  She is not having any chest pain.  Given this finding I would recommend evaluation with a PET CT scan and CT surgery evaluation for consideration of mediastinoscopy with biopsy.  The patient voiced understanding of our findings and plan moving forward.   Norleen IVAR Kidney, MD Department of Hematology/Oncology [facility] at [facility] Phone: [phone] Pager: [phone] Email:  [email or web address]

## 2024-01-01 NOTE — Progress Notes (Signed)
 [facility]  Patient presented to clinic, alone, for her scheduled appointment with DEVONNA Gift. I introduced myself and provided her with my direct contact information. Patient was encouraged to call me with any questions/concerns she may have.  Colene KYM Raider, RN, BSN, [facility] Oncology Nurse Navigator, [facility] 01/01/2024 1:26 PM

## 2024-01-02 LAB — BETA HCG QUANT (REF LAB): hCG Quant: <1 m[IU]/mL

## 2024-01-02 LAB — T3, FREE: T3, Free: 3 pg/mL (ref 2.0–4.4)

## 2024-01-02 LAB — AFP TUMOR MARKER: AFP, Serum, Tumor Marker: 2.1 ng/mL (ref 0.0–6.4)

## 2024-01-03 ENCOUNTER — Encounter (HOSPITAL_COMMUNITY)
Admission: RE | Admit: 2024-01-03 | Discharge: 2024-01-03 | Disposition: A | Discharge status: Home / Self Care | Source: Ambulatory Visit | Attending: Physician Assistant | Admitting: Physician Assistant

## 2024-01-03 DIAGNOSIS — R918 Other nonspecific abnormal finding of lung field: Secondary | ICD-10-CM | POA: Insufficient documentation

## 2024-01-03 LAB — GLUCOSE, CAPILLARY: Glucose-Capillary: 111 mg/dL — ABNORMAL HIGH (ref 70–99)

## 2024-01-03 MED ORDER — FLUDEOXYGLUCOSE F - 18 (FDG) INJECTION
12.0000 | Freq: Once | INTRAVENOUS | Status: AC | PRN
  Administered 2024-01-03: 12 via INTRAVENOUS

## 2024-01-04 ENCOUNTER — Other Ambulatory Visit: Payer: Self-pay | Admitting: Physician Assistant

## 2024-01-04 DIAGNOSIS — R918 Other nonspecific abnormal finding of lung field: Secondary | ICD-10-CM

## 2024-01-04 DIAGNOSIS — R948 Abnormal results of function studies of other organs and systems: Secondary | ICD-10-CM

## 2024-01-04 MED ORDER — POTASSIUM CHLORIDE CRYS ER 20 MEQ PO TBCR: 20.0000 meq | EXTENDED_RELEASE_TABLET | Freq: Every day | ORAL | 0 refills | Status: DC

## 2024-01-04 NOTE — Progress Notes (Signed)
 I notified Wells Chuck by phone regarding lab and PET scan results after discussing with Dr. Federico. PET is showing FDG avid hilar right hilar adenopathy with associated right suprahilar and right hilar lymph nodes/nodules. Lab work shows mild hypokalemia 3.2 and elevated inflammatory markers. Urgent referral sent to cardiothoracic surgery for consult.biopsy request. Prescription sent to pharmacy for 1 week of PO potassium replacement. All of patient's questions were answered and she expressed understanding of the plan provided.

## 2024-01-05 ENCOUNTER — Encounter: Payer: Self-pay | Admitting: Medical Oncology

## 2024-01-08 ENCOUNTER — Ambulatory Visit (INDEPENDENT_AMBULATORY_CARE_PROVIDER_SITE_OTHER): Payer: Self-pay | Admitting: Family Medicine

## 2024-01-08 ENCOUNTER — Encounter: Payer: Self-pay | Admitting: Family Medicine

## 2024-01-08 VITALS — BP 120/82 | HR 93 | Ht 64.0 in | Wt 239.2 lb

## 2024-01-08 DIAGNOSIS — Z6841 Body Mass Index (BMI) 40.0 and over, adult: Secondary | ICD-10-CM

## 2024-01-08 DIAGNOSIS — Z0001 Encounter for general adult medical examination with abnormal findings: Secondary | ICD-10-CM | POA: Diagnosis not present

## 2024-01-08 DIAGNOSIS — Z130 Encounter for screening for diseases of the blood and blood-forming organs and certain disorders involving the immune mechanism: Secondary | ICD-10-CM | POA: Diagnosis not present

## 2024-01-08 DIAGNOSIS — Z1329 Encounter for screening for other suspected endocrine disorder: Secondary | ICD-10-CM | POA: Diagnosis not present

## 2024-01-08 DIAGNOSIS — Z13228 Encounter for screening for other metabolic disorders: Secondary | ICD-10-CM | POA: Diagnosis not present

## 2024-01-08 DIAGNOSIS — Z136 Encounter for screening for cardiovascular disorders: Secondary | ICD-10-CM | POA: Diagnosis not present

## 2024-01-08 DIAGNOSIS — Z0000 Encounter for general adult medical examination without abnormal findings: Secondary | ICD-10-CM

## 2024-01-08 NOTE — Progress Notes (Signed)
 Established Patient Office Visit  Subjective    Patient ID: Sally Garner, female    DOB: 1985-12-01  Age: 38 y.o. MRN: [id number]  CC:  Chief Complaint  Patient presents with   Annual Exam    HPI Sally Garner presents for routine annual exam. Patient reports that she will be having a consult tor biopsy of lungs. There is a question on whether she has lymphoma.   Outpatient Encounter Medications as of 01/08/2024  Medication Sig   hydrochlorothiazide  (HYDRODIURIL ) 25 MG tablet Take 1 tablet (25 mg total) by mouth daily.   ibuprofen  (ADVIL ) 800 MG tablet Take 1 tablet (800 mg total) by mouth every 8 (eight) hours as needed for moderate pain (pain score 4-6).   methocarbamol  (ROBAXIN ) 500 MG tablet Take 1 tablet (500 mg total) by mouth every 6 (six) hours as needed for muscle spasms.   ondansetron  (ZOFRAN -ODT) 4 MG disintegrating tablet Take 1 tablet (4 mg total) by mouth every 8 (eight) hours as needed for nausea or vomiting.   clobetasol  ointment (TEMOVATE ) 0.05 % Apply 1 Application topically 2 (two) times daily. Apply to hands/arms   diclofenac  Sodium (VOLTAREN ) 1 % GEL Apply 2 g topically 4 (four) times daily. (Patient not taking: Reported on 06/12/2023)   metroNIDAZOLE  (FLAGYL ) 500 MG tablet Take 1 tablet (500 mg total) by mouth 2 (two) times daily. (Patient not taking: Reported on 01/08/2024)   naproxen  (NAPROSYN ) 500 MG tablet Take 1 tablet (500 mg total) by mouth 2 (two) times daily. (Patient not taking: Reported on 01/08/2024)   potassium chloride  SA (KLOR-CON  M) 20 MEQ tablet Take 1 tablet (20 mEq total) by mouth daily for 7 days. (Patient not taking: Reported on 01/08/2024)   terbinafine  (LAMISIL ) 250 MG tablet Take 1 tablet (250 mg total) by mouth daily. (Patient not taking: Reported on 01/08/2024)   [DISCONTINUED] amLODipine  (NORVASC ) 10 MG tablet Take 1 tablet (10 mg total) by mouth daily. (Patient not taking: Reported on 10/17/2017)   [DISCONTINUED] ferrous sulfate  325  (65 FE) MG tablet Take 1 tablet (325 mg total) by mouth 3 (three) times daily with meals. (Patient not taking: Reported on 10/17/2017)   No facility-administered encounter medications on file as of 01/08/2024.    Past Medical History:  Diagnosis Date   Anemia    Eczema    Hypertension    was put on medication end of 03/2015; states has caused headache and is not taking as prescribed   Migraines    Pre-diabetes    Status post repeat low transverse cesarean section 07/23/2017   Please schedule this patient for Postpartum visit in: 6 weeks with the following provider: Any provider For C/S patients schedule nurse incision check in weeks 2 weeks: yes High risk pregnancy complicated by: HTN Delivery mode:  CS Anticipated Birth Control:  BTL done PP PP Procedures needed: Incision check, BP check in 1 week Schedule Integrated BH visit: no    Status post tubal ligation 07/23/2017   Symptomatic cholelithiasis 04/2015    Past Surgical History:  Procedure Laterality Date   CESAREAN SECTION N/A 05/25/2013   Procedure: CESAREAN SECTION;  Surgeon: Delon CHRISTELLA Prude, DO;  Location: [facility];  Service: Obstetrics;  Laterality: N/A;   CESAREAN SECTION N/A 07/23/2017   Procedure: CESAREAN SECTION;  Surgeon: Fredirick Glenys RAMAN, MD;  Location: [facility];  Service: Obstetrics;  Laterality: N/A;   CHOLECYSTECTOMY N/A 05/05/2015   Procedure: LAPAROSCOPIC CHOLECYSTECTOMY;  Surgeon: Vicenta Poli, MD;  Location: [facility];  Service: General;  Laterality: N/A;   VAGINAL HYSTERECTOMY Bilateral 08/03/2021   Procedure: HYSTERECTOMY VAGINAL WITH BILATERAL SALPINGECTOMY;  Surgeon: Lorence Ozell CROME, MD;  Location: [facility];  Service: Gynecology;  Laterality: Bilateral;    Family History  Problem Relation Age of Onset   Birth defects Brother        polydactyl   Hypertension Maternal Aunt    Hypertension Maternal Uncle    Cancer Paternal Aunt        breast cancer    Social History   Socioeconomic  History   Marital status: Single    Spouse name: Not on file   Number of children: Not on file   Years of education: Not on file   Highest education level: 12th grade  Occupational History   Not on file  Tobacco Use   Smoking status: Every Day    Current packs/day: 1.00    Average packs/day: 1 pack/day for 3.0 years (3.0 ttl pk-yrs)    Types: Cigarettes   Smokeless tobacco: Never  Vaping Use   Vaping status: Never Used  Substance and Sexual Activity   Alcohol use: Not Currently   Drug use: No   Sexual activity: Yes    Partners: Male    Birth control/protection: Surgical  Other Topics Concern   Not on file  Social History Narrative   Not on file   Social Drivers of Health   Financial Resource Strain: Low Risk  (01/04/2024)   Overall Financial Resource Strain (CARDIA)    Difficulty of Paying Living Expenses: Not very hard  Food Insecurity: No Food Insecurity (01/04/2024)   Hunger Vital Sign    Worried About Running Out of Food in the Last Year: Never true    Ran Out of Food in the Last Year: Never true  Transportation Needs: No Transportation Needs (01/04/2024)   PRAPARE - Administrator, Civil Service (Medical): No    Lack of Transportation (Non-Medical): No  Physical Activity: Sufficiently Active (01/04/2024)   Exercise Vital Sign    Days of Exercise per Week: 7 days    Minutes of Exercise per Session: 150+ min  Stress: No Stress Concern Present (01/04/2024)   [employer] of Occupational Health - Occupational Stress Questionnaire    Feeling of Stress: Only a little  Social Connections: Socially Isolated (01/04/2024)   Social Connection and Isolation Panel    Frequency of Communication with Friends and Family: More than three times a week    Frequency of Social Gatherings with Friends and Family: More than three times a week    Attends Religious Services: Never    Database Administrator or Organizations: No    Attends Engineer, Structural:  Not on file    Marital Status: Never married  Intimate Partner Violence: Not At Risk (12/02/2022)   Humiliation, Afraid, Rape, and Kick questionnaire    Fear of Current or Ex-Partner: No    Emotionally Abused: No    Physically Abused: No    Sexually Abused: No    Review of Systems  All other systems reviewed and are negative.       Objective    BP 120/82   Pulse 93   Ht 5' 4 (1.626 m)   Wt 239 lb 3.2 oz (108.5 kg)   LMP 06/27/2021   SpO2 98%   BMI 41.06 kg/m   Physical Exam Vitals and nursing note reviewed.  Constitutional:      General: She is not in  acute distress.    Appearance: She is obese.  HENT:     Head: Normocephalic and atraumatic.     Right Ear: Tympanic membrane, ear canal and external ear normal.     Left Ear: Tympanic membrane, ear canal and external ear normal.     Nose: Nose normal.     Mouth/Throat:     Mouth: Mucous membranes are moist.     Pharynx: Oropharynx is clear.  Eyes:     Conjunctiva/sclera: Conjunctivae normal.     Pupils: Pupils are equal, round, and reactive to light.  Neck:     Thyroid : No thyromegaly.  Cardiovascular:     Rate and Rhythm: Normal rate and regular rhythm.     Heart sounds: Normal heart sounds. No murmur heard. Pulmonary:     Effort: Pulmonary effort is normal. No respiratory distress.     Breath sounds: Normal breath sounds.  Abdominal:     General: There is no distension.     Palpations: Abdomen is soft. There is no mass.     Tenderness: There is no abdominal tenderness.  Musculoskeletal:        General: Normal range of motion.     Cervical back: Normal range of motion and neck supple.  Skin:    General: Skin is warm and dry.  Neurological:     General: No focal deficit present.     Mental Status: She is alert and oriented to person, place, and time.  Psychiatric:        Mood and Affect: Mood normal.        Behavior: Behavior normal.         Assessment & Plan:   There are no diagnoses linked to  this encounter.   No follow-ups on file.   Tanda Raguel SQUIBB, MD

## 2024-01-09 LAB — CBC WITH DIFFERENTIAL/PLATELET
Basophils Absolute: 0 x10E3/uL (ref 0.0–0.2)
Basos: 0 %
EOS (ABSOLUTE): 0.1 x10E3/uL (ref 0.0–0.4)
Eos: 2 %
Hematocrit: 41.4 % (ref 34.0–46.6)
Hemoglobin: 13.3 g/dL (ref 11.1–15.9)
Immature Grans (Abs): 0 x10E3/uL (ref 0.0–0.1)
Immature Granulocytes: 0 %
Lymphocytes Absolute: 3.6 x10E3/uL — ABNORMAL HIGH (ref 0.7–3.1)
Lymphs: 49 %
MCH: 29.5 pg (ref 26.6–33.0)
MCHC: 32.1 g/dL (ref 31.5–35.7)
MCV: 92 fL (ref 79–97)
Monocytes Absolute: 0.5 x10E3/uL (ref 0.1–0.9)
Monocytes: 7 %
Neutrophils Absolute: 3.1 x10E3/uL (ref 1.4–7.0)
Neutrophils: 42 %
Platelets: 323 x10E3/uL (ref 150–450)
RBC: 4.51 x10E6/uL (ref 3.77–5.28)
RDW: 13.2 % (ref 11.7–15.4)
WBC: 7.4 x10E3/uL (ref 3.4–10.8)

## 2024-01-09 LAB — LIPID PANEL
Chol/HDL Ratio: 3.5 ratio (ref 0.0–4.4)
Cholesterol, Total: 152 mg/dL (ref 100–199)
HDL: 44 mg/dL (ref >39)
LDL Chol Calc (NIH): 85 mg/dL (ref 0–99)
Triglycerides: 127 mg/dL (ref 0–149)
VLDL Cholesterol Cal: 23 mg/dL (ref 5–40)

## 2024-01-09 LAB — COMPREHENSIVE METABOLIC PANEL WITH GFR
ALT: 12 IU/L (ref 0–32)
AST: 12 IU/L (ref 0–40)
Albumin: 4.2 g/dL (ref 3.9–4.9)
Alkaline Phosphatase: 63 IU/L (ref 41–116)
BUN/Creatinine Ratio: 12 (ref 9–23)
BUN: 8 mg/dL (ref 6–20)
Bilirubin Total: 0.2 mg/dL (ref 0.0–1.2)
CO2: 22 mmol/L (ref 20–29)
Calcium: 9.2 mg/dL (ref 8.7–10.2)
Chloride: 95 mmol/L — ABNORMAL LOW (ref 96–106)
Creatinine, Ser: 0.67 mg/dL (ref 0.57–1.00)
Globulin, Total: 3 g/dL (ref 1.5–4.5)
Glucose: 67 mg/dL — ABNORMAL LOW (ref 70–99)
Potassium: 3.7 mmol/L (ref 3.5–5.2)
Sodium: 136 mmol/L (ref 134–144)
Total Protein: 7.2 g/dL (ref 6.0–8.5)
eGFR: 115 mL/min/1.73 (ref >59)

## 2024-01-09 LAB — HEMOGLOBIN A1C
Est. average glucose Bld gHb Est-mCnc: 134 mg/dL
Hgb A1c MFr Bld: 6.3 % — ABNORMAL HIGH (ref 4.8–5.6)

## 2024-01-10 ENCOUNTER — Ambulatory Visit: Payer: Self-pay | Admitting: Family Medicine

## 2024-01-23 ENCOUNTER — Other Ambulatory Visit: Payer: Self-pay | Admitting: *Deleted

## 2024-01-23 ENCOUNTER — Encounter: Payer: Self-pay | Admitting: *Deleted

## 2024-01-23 ENCOUNTER — Ambulatory Visit
Attending: Thoracic Surgery (Cardiothoracic Vascular Surgery) | Admitting: Thoracic Surgery (Cardiothoracic Vascular Surgery)

## 2024-01-23 ENCOUNTER — Encounter: Payer: Self-pay | Admitting: Thoracic Surgery (Cardiothoracic Vascular Surgery)

## 2024-01-23 ENCOUNTER — Encounter: Admitting: Thoracic Surgery (Cardiothoracic Vascular Surgery)

## 2024-01-23 VITALS — BP 129/90 | HR 94 | Resp 20 | Ht 64.0 in | Wt 243.7 lb

## 2024-01-23 DIAGNOSIS — R918 Other nonspecific abnormal finding of lung field: Secondary | ICD-10-CM

## 2024-01-23 NOTE — Progress Notes (Signed)
 PCP is Tanda Bleacher, MD Referring Provider is Walisiewicz, Kaitlyn E,*  Chief Complaint  Patient presents with   Hilar mass    New patient consult, review all studies    HPI: [name] is sent for consultation regarding right hilar adenopathy.  Sally Garner is a 38 year old woman with a past medical history significant for obesity, tobacco use, hypertension, reflux, prediabetes, eczema, anemia, and right hilar adenopathy.  In 2022 she presented to the emergency room after having a severe cough.  She went into a coughing spasm that ultimately led to her throwing up.  As part of her evaluation she had a CT of the chest which showed a right hilar mass consistent with adenopathy.  She was treated presumptively for pneumonia and did not realize there was anything that needed follow-up.  She has continued to have a dry cough ever since.  Recently she had another episode that led to emesis and trip to the emergency room.  A repeat CT showed slight increase in size in the right hilar adenopathy.  She has dry cough but no wheezing.  She does get short of breath with activity sometimes.  Complains of loss of appetite and decreased energy.  She does have chronic headaches.  She has difficulty seeing at night but no other visual changes.  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  Zubrod Score: At the time of surgery this patient's most appropriate activity status/level should be described as: []     0    Normal activity, no symptoms [x]     1    Restricted in physical strenuous activity but ambulatory, able to do out light work []     2    Ambulatory and capable of self care, unable to do work activities, up and about >50 % of waking hours                              []     3    Only limited self care, in bed greater than 50% of waking hours []     4    Completely disabled, no self care, confined to bed or chair []     5    Moribund   Past Medical History:  Diagnosis  Date   Anemia    Eczema    Hypertension    was put on medication end of 03/2015; states has caused headache and is not taking as prescribed   Migraines    Pre-diabetes    Status post repeat low transverse cesarean section 07/23/2017   Please schedule this patient for Postpartum visit in: 6 weeks with the following provider: Any provider For C/S patients schedule nurse incision check in weeks 2 weeks: yes High risk pregnancy complicated by: HTN Delivery mode:  CS Anticipated Birth Control:  BTL done PP PP Procedures needed: Incision check, BP check in 1 week Schedule Integrated BH visit: no    Status post tubal ligation 07/23/2017   Symptomatic cholelithiasis 04/2015    Past Surgical History:  Procedure Laterality Date   CESAREAN SECTION N/A 05/25/2013   Procedure: CESAREAN SECTION;  Surgeon: Delon CHRISTELLA Prude, DO;  Location: [facility];  Service: Obstetrics;  Laterality: N/A;   CESAREAN SECTION N/A 07/23/2017   Procedure: CESAREAN SECTION;  Surgeon: Fredirick Glenys RAMAN, MD;  Location: [facility];  Service: Obstetrics;  Laterality: N/A;   CHOLECYSTECTOMY N/A 05/05/2015   Procedure: LAPAROSCOPIC CHOLECYSTECTOMY;  Surgeon: Vicenta Poli, MD;  Location: [facility];  Service: General;  Laterality: N/A;   VAGINAL HYSTERECTOMY Bilateral 08/03/2021   Procedure: HYSTERECTOMY VAGINAL WITH BILATERAL SALPINGECTOMY;  Surgeon: Lorence Ozell CROME, MD;  Location: [facility];  Service: Gynecology;  Laterality: Bilateral;    Family History  Problem Relation Age of Onset   Birth defects Brother        polydactyl   Hypertension Maternal Aunt    Hypertension Maternal Uncle    Cancer Paternal Aunt        breast cancer    Social History Social History   Tobacco Use   Smoking status: Every Day    Current packs/day: 1.00    Average packs/day: 1 pack/day for 3.0 years (3.0 ttl pk-yrs)    Types: Cigarettes   Smokeless tobacco: Never  Vaping Use   Vaping status: Never Used  Substance Use Topics    Alcohol use: Not Currently   Drug use: No    Current Outpatient Medications  Medication Sig Dispense Refill   clobetasol  ointment (TEMOVATE ) 0.05 % Apply 1 Application topically 2 (two) times daily. Apply to hands/arms 30 g 0   diclofenac  Sodium (VOLTAREN ) 1 % GEL Apply 2 g topically 4 (four) times daily. 100 g 0   hydrochlorothiazide  (HYDRODIURIL ) 25 MG tablet Take 1 tablet (25 mg total) by mouth daily. 90 tablet 1   ibuprofen  (ADVIL ) 800 MG tablet Take 1 tablet (800 mg total) by mouth every 8 (eight) hours as needed for moderate pain (pain score 4-6). 21 tablet 0   methocarbamol  (ROBAXIN ) 500 MG tablet Take 1 tablet (500 mg total) by mouth every 6 (six) hours as needed for muscle spasms. 20 tablet 0   metroNIDAZOLE  (FLAGYL ) 500 MG tablet Take 1 tablet (500 mg total) by mouth 2 (two) times daily. 14 tablet 0   naproxen  (NAPROSYN ) 500 MG tablet Take 1 tablet (500 mg total) by mouth 2 (two) times daily. 30 tablet 0   ondansetron  (ZOFRAN -ODT) 4 MG disintegrating tablet Take 1 tablet (4 mg total) by mouth every 8 (eight) hours as needed for nausea or vomiting. 20 tablet 0   potassium chloride  SA (KLOR-CON  M) 20 MEQ tablet Take 1 tablet (20 mEq total) by mouth daily for 7 days. 7 tablet 0   terbinafine  (LAMISIL ) 250 MG tablet Take 1 tablet (250 mg total) by mouth daily. 14 tablet 0   No current facility-administered medications for this visit.    No Known Allergies  Review of Systems  Constitutional:  Positive for appetite change and fatigue.  HENT:  Negative for trouble swallowing and voice change.   Eyes:  Positive for visual disturbance.  Respiratory:  Positive for cough and shortness of breath. Negative for wheezing.   Cardiovascular:  Negative for chest pain and leg swelling.  Gastrointestinal:  Negative for abdominal pain.  Genitourinary:  Negative for difficulty urinating.  Musculoskeletal:  Negative for arthralgias and gait problem.  Skin:  Positive for rash.       Itching   Neurological:  Positive for numbness and headaches.  All other systems reviewed and are negative.   BP (!) 129/90 (BP Location: Left Arm, Patient Position: Sitting, Cuff Size: Large)   Pulse 94   Resp 20   Ht 5' 4 (1.626 m)   Wt 243 lb 11.2 oz (110.5 kg)   LMP 06/27/2021   SpO2 100% Comment: RA  BMI 41.83 kg/m  Physical Exam Vitals reviewed.  Constitutional:      General:  She is not in acute distress.    Appearance: She is obese.  HENT:     Head: Normocephalic and atraumatic.  Eyes:     Extraocular Movements: Extraocular movements intact.  Cardiovascular:     Rate and Rhythm: Normal rate and regular rhythm.     Heart sounds: Normal heart sounds. No murmur heard.    No friction rub. No gallop.  Pulmonary:     Effort: Pulmonary effort is normal. No respiratory distress.     Breath sounds: Normal breath sounds. No wheezing or rales.  Abdominal:     General: There is no distension.     Palpations: Abdomen is soft.     Tenderness: There is no abdominal tenderness.  Musculoskeletal:     Cervical back: Neck supple.  Lymphadenopathy:     Cervical: No cervical adenopathy.  Skin:    General: Skin is warm and dry.  Neurological:     General: No focal deficit present.     Mental Status: She is alert and oriented to person, place, and time.     Cranial Nerves: No cranial nerve deficit.     Motor: No weakness.    Diagnostic Tests: CT ANGIOGRAPHY CHEST WITH CONTRAST   TECHNIQUE: Multidetector CT imaging of the chest was performed using the standard protocol during bolus administration of intravenous contrast. Multiplanar CT image reconstructions and MIPs were obtained to evaluate the vascular anatomy.   RADIATION DOSE REDUCTION: This exam was performed according to the departmental dose-optimization program which includes automated exposure control, adjustment of the mA and/or kV according to patient size and/or use of iterative reconstruction technique.   CONTRAST:   OMNIPAQUE  IOHEXOL  350 MG/ML SOLN   COMPARISON:  January 19, 2021   FINDINGS: Cardiovascular: Satisfactory opacification of the pulmonary arteries to the segmental level. No evidence of pulmonary embolism. Normal heart size. No pericardial effusion.   Mediastinum/Nodes: There is stable right hilar lymphadenopathy. Thyroid  gland, trachea, and esophagus demonstrate no significant findings.   Lungs/Pleura: There is a 2.1 cm x 1.5 cm x 2.9 cm low-attenuation (approximately 49.55 Hounsfield units) suprahilar soft tissue mass which extends to the level of the right hilum (axial CT images 40 through 50, CT series 4. This is present on the prior study and measured 1.4 cm x 1.1 cm x 2.3 cm on the prior study.   Mild, hazy appearing areas of ground-glass appearing lung parenchyma are seen within the bilateral upper lobes.   Mild right middle lobe infiltrate is seen. Very mild, slightly nodular appearing right upper lobe infiltrate is also noted.   A stable, likely benign 4 mm posterolateral left upper lobe pulmonary nodule is seen (axial CT image 52, CT series 12).   There is an ill-defined 3 mm posterior right upper lobe pulmonary nodule (axial CT image 59, CT series 12). An additional 4 mm ill-defined pulmonary nodule is seen within the adjacent portion of the superior segment of the right lower lobe.   A 4 mm posterior left lower lobe pulmonary nodule is present (axial CT image 59, CT series 12).   No pleural effusion or pneumothorax is identified.   Upper Abdomen: Surgical clips are seen within the gallbladder fossa.   Musculoskeletal: No chest wall abnormality. No acute or significant osseous findings.   Review of the MIP images confirms the above findings.   IMPRESSION: 1. No evidence of pulmonary embolism. 2. Mild right middle lobe and slightly nodular appearing right upper lobe infiltrates. 3. Mild, hazy appearing areas of  ground-glass appearing lung parenchyma  within the bilateral upper lobes, which may represent mild pulmonary edema. 4. Low-attenuation suprahilar soft tissue mass which extends to the level of the right hilum, mildly increased in size since the prior study. While this may represent interval increase in right hilar lymphadenopathy, further evaluation with a nuclear medicine PET/CT is recommended to further exclude the presence of an underlying neoplastic process. 5. Stable, likely benign 4 mm posterolateral left upper lobe pulmonary nodule. 6. Additional 4 mm ill-defined pulmonary nodules within the superior segment of the right lower lobe and posterior left lower lobe. No follow-up needed if patient is low-risk.This recommendation follows the consensus statement: Guidelines for Management of Incidental Pulmonary Nodules Detected on CT Images: From the Fleischner Society 2017; Radiology 2017; 284:228-243. 7. Evidence of prior cholecystectomy.     Electronically Signed   By: Suzen Dials M.D.   On: 12/27/2023 01:54 PET AND CT SKULL BASE TO MID THIGH 01/03/2024 04:31:56 PM   TECHNIQUE:   RADIOPHARMACEUTICAL: 12 mCi F-18 FDG Uptake time 60 minutes. Glucose level 111 mg/dl. Blood pool SUV 2.6; hepatic parenchymal SUV 3.4.   PET imaging was acquired from the base of the skull to the mid thighs. Non-contrast enhanced computed tomography was obtained for attenuation correction and anatomic localization.   COMPARISON: Chest CT 12/27/2023.   CLINICAL HISTORY: Right hilar mass on CT scan of 12/27/2023.   FINDINGS:   HEAD AND NECK: Symmetric high activity in the palatine tonsillar tissues, maximum SUV 11.4 on the right and 9.8 on the left, presumably physiologic given the symmetry. Left level IIa lymph node 0.7 cm in short axis on image 22 series 4 with maximum SUV 4.6, Deauville 4. Chronic left maxillary sinusitis.   CHEST: 1.4 cm short axis right suprahilar lymph node/nodule with maximum SUV 4.1, Deauville  4. Ill defined right hilar lymph node with maximum SUV 4.8, Deauville 4. Suspected mild air trapping posteriorly in the right upper lobe. Up previous small nodules in the 3 to 4 mm range are only faintly seen today and are not appreciably hypermetabolic but are below sensitive PET CT size thresholds.   ABDOMEN AND PELVIS: The spleen appears unremarkable. Cholecystectomy. No metabolically active intraperitoneal mass. No metabolically active lymphadenopathy. Physiologic activity within the gastrointestinal and genitourinary systems.   BONES AND SOFT TISSUE: Mild heterogeneity of marrow activity in the skeleton without focal skeletal lesion identified. No abnormal FDG activity localizes to the bones. No metabolically active aggressive osseous lesion.   IMPRESSION: 1. Right hilar adenopathy with associated right suprahilar and right hilar lymph nodes/nodules, maximal SUV 4.8 ( Deauville 4). 2. Small left level IIa lymph node, Deauville 4. 3. Chronic left maxillary sinusitis.   Electronically signed by: Ryan Salvage MD 01/03/2024 05:27 PM EST RP Workstation: [id number]   I personally reviewed the CT and PET/CT images.  There is right hilar adenopathy that is hypermetabolic with an SUV of 4.8.  Largest area is minimally increased in size in comparison to CT from 2022.  Multiple tiny lung nodules.  Impression: Sally Garner is a 38 year old woman with a past medical history significant for obesity, tobacco use, hypertension, reflux, prediabetes, eczema, anemia, and right hilar adenopathy.  Right hilar adenopathy-this increased on very mildly in size dating back to 2022.  Differential diagnosis includes lymphoma, sarcoidosis, and other infectious and inflammatory nodules.  Unfortunately, the adenopathy is relatively inaccessible.  Options to access it include endobronchial ultrasound and VATS approach.  Obviously would like to avoid a major operation if possible.  Therefore, even though  less likely a definitive diagnosis I think an EBUS would be the appropriate first step.  I discussed these issues with Sally Garner.  She understands and is in agreement.  I described the postoperative procedure bronchoscopy and endobronchial ultrasound with Sally Garner.  She understands the general nature of the procedure including the need for general anesthesia.  She understands this is an endoscopic and will be done on an outpatient basis.  I informed her of the indications, risks, benefits, and alternatives.  She understands the risks include, but not limited to MI, DVT, PE, bleeding, pneumothorax, and failure to make a diagnosis.  She accepts the risk and agrees to proceed.  Would like to wait until after New Year's to do the procedure.  Plan: Will call patient to schedule bronchoscopy and endobronchial ultrasound  Elspeth JAYSON Millers, MD [facility] [phone]

## 2024-01-24 ENCOUNTER — Encounter: Payer: Self-pay | Admitting: Physician Assistant

## 2024-02-16 ENCOUNTER — Ambulatory Visit: Admission: EM | Admit: 2024-02-16 | Discharge: 2024-02-16 | Disposition: A | Discharge status: Home / Self Care

## 2024-02-16 ENCOUNTER — Encounter: Payer: Self-pay | Admitting: Emergency Medicine

## 2024-02-16 DIAGNOSIS — J069 Acute upper respiratory infection, unspecified: Secondary | ICD-10-CM | POA: Diagnosis not present

## 2024-02-16 DIAGNOSIS — Z98891 History of uterine scar from previous surgery: Secondary | ICD-10-CM | POA: Insufficient documentation

## 2024-02-16 LAB — POCT INFLUENZA A/B
Influenza A, POC: NEGATIVE
Influenza B, POC: NEGATIVE

## 2024-02-16 MED ORDER — ACETAMINOPHEN 325 MG PO TABS
650.0000 mg | ORAL_TABLET | Freq: Once | ORAL | Status: AC
  Administered 2024-02-16: 650 mg via ORAL

## 2024-02-16 NOTE — ED Provider Notes (Signed)
 " [facility]    CSN: [id number] Arrival date & time: 02/16/24  0805      History   Chief Complaint Chief Complaint  Patient presents with   Cough   Nasal Congestion   Emesis   Chills   Generalized Body Aches    HPI Sally Garner is a 38 y.o. female.   Pt presents today due fever (101.3 found during triage), vomiting (3 episodes), cough, throat pain, headache, and nasal congestion for the past 2 days. Pt states that she has been in contact with people with flu. Pt states that she has been able to tolerate fluids but not solids. Pt states that she has been using Mucinex  and ibuprofen  for symptoms with mild relief.   The history is provided by the patient.  Cough Emesis Associated symptoms: cough     Past Medical History:  Diagnosis Date   Anemia    Eczema    Hypertension    was put on medication end of 03/2015; states has caused headache and is not taking as prescribed   Migraines    Pre-diabetes    Status post repeat low transverse cesarean section 07/23/2017   Please schedule this patient for Postpartum visit in: 6 weeks with the following provider: Any provider For C/S patients schedule nurse incision check in weeks 2 weeks: yes High risk pregnancy complicated by: HTN Delivery mode:  CS Anticipated Birth Control:  BTL done PP PP Procedures needed: Incision check, BP check in 1 week Schedule Integrated BH visit: no    Status post tubal ligation 07/23/2017   Symptomatic cholelithiasis 04/2015    Patient Active Problem List   Diagnosis Date Noted   History of cesarean section 02/16/2024   Hilar mass 01/23/2024   Gestational hypertension 04/25/2022   Uterine size-date discrepancy 04/25/2022   Muscle pain 04/25/2022   Iron deficiency anemia of pregnancy 04/25/2022   Gastroesophageal reflux disease 04/25/2022   Supervision of other normal pregnancy, antepartum 04/25/2022   H/O vaginal hysterectomy 09/16/2021   History of bilateral salpingectomy  09/16/2021   Post-operative state 08/03/2021   Elevated hemoglobin A1c 01/03/2017   Anemia 05/24/2013   Smoker 05/24/2013   Obesity, unspecified--BMI 37.1 05/24/2013   Hx of primary hypertension--2-3 years ago 05/24/2013   Immunization counseling 03/13/2013    Past Surgical History:  Procedure Laterality Date   CESAREAN SECTION N/A 05/25/2013   Procedure: CESAREAN SECTION;  Surgeon: Delon CHRISTELLA Prude, DO;  Location: [facility];  Service: Obstetrics;  Laterality: N/A;   CESAREAN SECTION N/A 07/23/2017   Procedure: CESAREAN SECTION;  Surgeon: Fredirick Glenys RAMAN, MD;  Location: [facility];  Service: Obstetrics;  Laterality: N/A;   CHOLECYSTECTOMY N/A 05/05/2015   Procedure: LAPAROSCOPIC CHOLECYSTECTOMY;  Surgeon: Vicenta Poli, MD;  Location: [facility];  Service: General;  Laterality: N/A;   VAGINAL HYSTERECTOMY Bilateral 08/03/2021   Procedure: HYSTERECTOMY VAGINAL WITH BILATERAL SALPINGECTOMY;  Surgeon: Lorence Ozell CROME, MD;  Location: [facility];  Service: Gynecology;  Laterality: Bilateral;    OB History     Gravida  2   Para  2   Term  2   Preterm      AB      Living  2      SAB      IAB      Ectopic      Multiple  0   Live Births  2            Home Medications  Prior to Admission medications  Medication Sig Start Date End Date Taking? Authorizing Provider  hydrochlorothiazide  (HYDRODIURIL ) 25 MG tablet Take 1 tablet (25 mg total) by mouth daily. 06/12/23  Yes Tanda Bleacher, MD  amLODipine  (NORVASC ) 10 MG tablet Take 1 tablet (10 mg total) by mouth daily. Patient not taking: Reported on 10/17/2017 07/25/17 01/10/20  Sharl Lucienne BROCKS, CNM  ferrous sulfate  325 (65 FE) MG tablet Take 1 tablet (325 mg total) by mouth 3 (three) times daily with meals. Patient not taking: Reported on 10/17/2017 07/25/17 01/10/20  Sharl Lucienne BROCKS, CNM    Family History Family History  Problem Relation Age of Onset   Birth defects Brother        polydactyl    Hypertension Maternal Aunt    Hypertension Maternal Uncle    Cancer Paternal Aunt        breast cancer    Social History Social History[1]   Allergies   Patient has no known allergies.   Review of Systems Review of Systems  Respiratory:  Positive for cough.   Gastrointestinal:  Positive for vomiting.     Physical Exam Triage Vital Signs ED Triage Vitals  Encounter Vitals Group     BP 02/16/24 0855 122/86     Girls Systolic BP Percentile --      Girls Diastolic BP Percentile --      Boys Systolic BP Percentile --      Boys Diastolic BP Percentile --      Pulse Rate 02/16/24 0855 (!) 110     Resp 02/16/24 0855 18     Temp 02/16/24 0855 (!) 101.3 F (38.5 C)     Temp Source 02/16/24 0855 Oral     SpO2 02/16/24 0855 94 %     Weight --      Height --      Head Circumference --      Peak Flow --      Pain Score 02/16/24 0854 8     Pain Loc --      Pain Education --      Exclude from Growth Chart --    No data found.  Updated Vital Signs BP 122/86 (BP Location: Left Arm)   Pulse (!) 110   Temp (!) 101.3 F (38.5 C) (Oral)   Resp 18   LMP 06/27/2021   SpO2 94%   Visual Acuity Right Eye Distance:   Left Eye Distance:   Bilateral Distance:    Right Eye Near:   Left Eye Near:    Bilateral Near:     Physical Exam Vitals and nursing note reviewed.  Constitutional:      General: She is not in acute distress.    Appearance: Normal appearance. She is ill-appearing. She is not toxic-appearing or diaphoretic.  Eyes:     General: No scleral icterus. Cardiovascular:     Rate and Rhythm: Normal rate and regular rhythm.     Heart sounds: Normal heart sounds.  Pulmonary:     Effort: Pulmonary effort is normal. No respiratory distress.     Breath sounds: Normal breath sounds. No wheezing or rhonchi.  Skin:    General: Skin is warm.  Neurological:     Mental Status: She is alert and oriented to person, place, and time.  Psychiatric:        Mood and Affect:  Mood normal.        Behavior: Behavior normal.      UC Treatments / Results  Labs (all labs ordered are listed, but only abnormal results are displayed) Labs Reviewed  POCT INFLUENZA A/B - Normal    EKG   Radiology No results found.  Procedures Procedures (including critical care time)  Medications Ordered in UC Medications  acetaminophen  (TYLENOL ) tablet 650 mg (650 mg Oral Given 02/16/24 0900)    Initial Impression / Assessment and Plan / UC Course  I have reviewed the triage vital signs and the nursing notes.  Pertinent labs & imaging results that were available during my care of the patient were reviewed by me and considered in my medical decision making (see chart for details).     Final Clinical Impressions(s) / UC Diagnoses   Final diagnoses:  Viral URI     Discharge Instructions      You been diagnosed with a viral illness today. -Viruses have to run their course and medicines that are prescribed are meant to help with symptoms. - With viruses usually feel poorly from 3 to 7 days with cough being the last symptoms to resolve.  -Cough can linger from days to weeks.  Antibiotics are not effective for viruses. -If your cough lasts more than 2 weeks and you are coughing so hard that you are vomiting or feel like you could pass out we need to follow-up with PCP for further testing and evaluation. -Rest, increase water intake, may use pseudoephedrine for nasal congestion, Delsym  (dextromethorphan ) or honey as needed for cough, and ibuprofen  and/or Tylenol  as directed on packaging for pain and fever. -If you have hypertension you should take Coricidin or other OTC meds approved for people with high blood pressure. -You may use a spoonful of honey every 4-6 hours as needed for throat pain and cough. -Warm tea with honey and lemon are helpful for soothe throat as well.  Chloraseptic and Cepacol make a throat lozenge with numbing medication, can be purchased  over-the-counter. -May also use Flonase  or sinus rinse for sinus pressure or nasal congestion.  Be sure to use distilled bottled water for sinus rinses. -May use coolmist humidifier to open up nasal passages -May elevate head to assist with postnasal drainage. -If you feel poorly (fever, fatigue, shortness of breath, nausea, etc.) for more than 10 days to be sure to follow-up with PCP or in clinic for further evaluation and additional treatments. If you experience chest pain with shortness of breath or pulse oxygen less than 95% you should report to the ER.    ED Prescriptions   None    PDMP not reviewed this encounter.    [1]  Social History Tobacco Use   Smoking status: Every Day    Current packs/day: 1.00    Average packs/day: 1 pack/day for 3.0 years (3.0 ttl pk-yrs)    Types: Cigarettes   Smokeless tobacco: Never  Vaping Use   Vaping status: Never Used  Substance Use Topics   Alcohol use: Not Currently   Drug use: No     Andra Corean BROCKS, PA-C 02/16/24 0935  "

## 2024-02-16 NOTE — ED Triage Notes (Signed)
 Pt reports productive cough, sore throat, nasal congestion, chills, body aches, and emesis episodes x 2 days. Pt denies fevers. Has been around family positive for flu. 3 vomiting episodes in the past 24hrs. Denies ongoing nausea. Has been able to keep ginger ale down. Mucinex  taken with little relief.

## 2024-02-16 NOTE — Discharge Instructions (Signed)

## 2024-02-19 NOTE — Pre-Procedure Instructions (Signed)
 Surgical Instructions   Your procedure is scheduled on February 26, 2024. Report to [facility] Main Entrance A at 9:15 A.M., then check in with the Admitting office. Any questions or running late day of surgery: call [phone]  Questions prior to your surgery date: call [phone], Monday-Friday, 8am-4pm. If you experience any cold or flu symptoms such as cough, fever, chills, shortness of breath, etc. between now and your scheduled surgery, please notify us  at the above number.     Remember:  Do not eat or drink after midnight the night before your surgery    Take these medicines the morning of surgery with A SIP OF WATER: NONE   One week prior to surgery, STOP taking any Aspirin  (unless otherwise instructed by your surgeon) Aleve , Naproxen , Ibuprofen , Motrin , Advil , Goody's, BC's, all herbal medications, fish oil, and non-prescription vitamins.                     Do NOT Smoke (Tobacco/Vaping) for 24 hours prior to your procedure.  If you use a CPAP at night, you may bring your mask/headgear for your overnight stay.   You will be asked to remove any contacts, glasses, piercing's, hearing aid's, dentures/partials prior to surgery. Please bring cases for these items if needed.    Patients discharged the day of surgery will not be allowed to drive home, and someone needs to stay with them for 24 hours.  SURGICAL WAITING ROOM VISITATION Patients may have no more than 2 support people in the waiting area - these visitors may rotate.   Pre-op nurse will coordinate an appropriate time for 1 ADULT support person, who may not rotate, to accompany patient in pre-op.  Children under the age of 63 must have an adult with them who is not the patient and must remain in the main waiting area with an adult.  If the patient needs to stay at the hospital during part of their recovery, the visitor guidelines for inpatient rooms apply.  Please refer to the [facility] website for the visitor  guidelines for any additional information.   If you received a COVID test during your pre-op visit  it is requested that you wear a mask when out in public, stay away from anyone that may not be feeling well and notify your surgeon if you develop symptoms. If you have been in contact with anyone that has tested positive in the last 10 days please notify you surgeon.      Pre-operative CHG Bathing Instructions   You can play a key role in reducing the risk of infection after surgery. Your skin needs to be as free of germs as possible. You can reduce the number of germs on your skin by washing with CHG (chlorhexidine  gluconate) soap before surgery. CHG is an antiseptic soap that kills germs and continues to kill germs even after washing.   DO NOT use if you have an allergy to chlorhexidine /CHG or antibacterial soaps. If your skin becomes reddened or irritated, stop using the CHG and notify one of our RNs at [phone].              TAKE A SHOWER THE NIGHT BEFORE SURGERY   Please keep in mind the following:  DO NOT shave, including legs and underarms, 48 hours prior to surgery.   You may shave your face before/day of surgery.  Place clean sheets on your bed the night before surgery Use a clean washcloth (not used since being washed)  for shower. DO NOT sleep with pet's night before surgery.  CHG Shower Instructions:  Wash your face and private area with normal soap. If you choose to wash your hair, wash first with your normal shampoo.  After you use shampoo/soap, rinse your hair and body thoroughly to remove shampoo/soap residue.  Turn the water OFF and apply half the bottle of CHG soap to a CLEAN washcloth.  Apply CHG soap ONLY FROM YOUR NECK DOWN TO YOUR TOES (washing for 3-5 minutes)  DO NOT use CHG soap on face, private areas, open wounds, or sores.  Pay special attention to the area where your surgery is being performed.  If you are having back surgery, having someone wash your back  for you may be helpful. Wait 2 minutes after CHG soap is applied, then you may rinse off the CHG soap.  Pat dry with a clean towel  Put on clean pajamas    Additional instructions for the day of surgery: If you choose, you may shower the morning of surgery with an antibacterial soap.  DO NOT APPLY any lotions, deodorants, cologne, or perfumes.   Do not wear jewelry or makeup Do not wear nail polish, gel polish, artificial nails, or any other type of covering on natural nails (fingers and toes) Do not bring valuables to the hospital. [facility] is not responsible for valuables/personal belongings. Put on clean/comfortable clothes.  Please brush your teeth.  Ask your nurse before applying any prescription medications to the skin.

## 2024-02-20 ENCOUNTER — Other Ambulatory Visit: Payer: Self-pay

## 2024-02-20 ENCOUNTER — Ambulatory Visit (HOSPITAL_COMMUNITY)
Admission: RE | Admit: 2024-02-20 | Discharge: 2024-02-20 | Disposition: A | Discharge status: Home / Self Care | Source: Ambulatory Visit | Attending: Thoracic Surgery (Cardiothoracic Vascular Surgery) | Admitting: Thoracic Surgery (Cardiothoracic Vascular Surgery)

## 2024-02-20 ENCOUNTER — Encounter (HOSPITAL_COMMUNITY): Payer: Self-pay

## 2024-02-20 ENCOUNTER — Inpatient Hospital Stay (HOSPITAL_COMMUNITY)
Admission: RE | Admit: 2024-02-20 | Discharge: 2024-02-20 | Disposition: A | Source: Ambulatory Visit | Attending: Thoracic Surgery (Cardiothoracic Vascular Surgery)

## 2024-02-20 VITALS — BP 117/78 | HR 108 | Temp 98.2°F | Resp 18 | Ht 65.0 in | Wt 233.3 lb

## 2024-02-20 DIAGNOSIS — I2510 Atherosclerotic heart disease of native coronary artery without angina pectoris: Secondary | ICD-10-CM | POA: Insufficient documentation

## 2024-02-20 DIAGNOSIS — R918 Other nonspecific abnormal finding of lung field: Secondary | ICD-10-CM | POA: Insufficient documentation

## 2024-02-20 HISTORY — DX: Pneumonia, unspecified organism: J18.9

## 2024-02-20 LAB — COMPREHENSIVE METABOLIC PANEL WITH GFR
ALT: 11 U/L (ref 0–44)
AST: 21 U/L (ref 15–41)
Albumin: 3.9 g/dL (ref 3.5–5.0)
Alkaline Phosphatase: 67 U/L (ref 38–126)
Anion gap: 10 (ref 5–15)
BUN: 7 mg/dL (ref 6–20)
CO2: 33 mmol/L — ABNORMAL HIGH (ref 22–32)
Calcium: 9.3 mg/dL (ref 8.9–10.3)
Chloride: 95 mmol/L — ABNORMAL LOW (ref 98–111)
Creatinine, Ser: 0.68 mg/dL (ref 0.44–1.00)
GFR, Estimated: >60 mL/min
Glucose, Bld: 129 mg/dL — ABNORMAL HIGH (ref 70–99)
Potassium: 3 mmol/L — ABNORMAL LOW (ref 3.5–5.1)
Sodium: 137 mmol/L (ref 135–145)
Total Bilirubin: 0.3 mg/dL (ref 0.0–1.2)
Total Protein: 7.6 g/dL (ref 6.5–8.1)

## 2024-02-20 LAB — PROTIME-INR
INR: 1 (ref 0.8–1.2)
Prothrombin Time: 13.8 s (ref 11.4–15.2)

## 2024-02-20 LAB — CBC
HCT: 40.3 % (ref 36.0–46.0)
Hemoglobin: 13.7 g/dL (ref 12.0–15.0)
MCH: 29.8 pg (ref 26.0–34.0)
MCHC: 34 g/dL (ref 30.0–36.0)
MCV: 87.6 fL (ref 80.0–100.0)
Platelets: 303 K/uL (ref 150–400)
RBC: 4.6 MIL/uL (ref 3.87–5.11)
RDW: 12.4 % (ref 11.5–15.5)
WBC: 8.6 K/uL (ref 4.0–10.5)
nRBC: 0 % (ref 0.0–0.2)

## 2024-02-20 LAB — APTT: aPTT: 32 s (ref 24–36)

## 2024-02-20 NOTE — Progress Notes (Signed)
 PCP - Dr. Raguel Blush Cardiologist - Denies  PPM/ICD - Denies Device Orders - n/a Rep Notified - n/a  Chest x-ray - 02/20/2024 EKG - 02/20/2024 Stress Test - Denies ECHO - Denies Cardiac Cath - Denies  Sleep Study - Denies CPAP - n/a  Pt is Pre-DM  Last dose of GLP1 agonist- n/a GLP1 instructions: n/a  Blood Thinner Instructions: n/a Aspirin  Instructions: n/a  NPO after midnight  COVID TEST- n/a   Anesthesia review: Yes. Pt recently seen at [facility] urgent care 12/26 and diagnosed with URI. Pt endorses productive cough with clear phlegm and some shortness of breath at pre-op appointment. Discussed with Allison Zelenak, PA-C who also saw the patient at appointment.  Patient denies shortness of breath, fever, cough and chest pain at PAT appointment   All instructions explained to the patient, with a verbal understanding of the material. Patient agrees to go over the instructions while at home for a better understanding. Patient also instructed to self quarantine after being tested for COVID-19. The opportunity to ask questions was provided.

## 2024-02-20 NOTE — Progress Notes (Signed)
 Anesthesia APP PAT Evaluation:   Case: [id number] Date/Time: 02/26/24 1101   Procedure: BRONCHOSCOPY, WITH EBUS   Anesthesia type: General   Diagnosis: Hilar adenopathy [R59.0]   Pre-op diagnosis: RIGHT HILAR ADENOPATHY   [facility] ROOM 10 / [facility]   Surgeons: Kerrin Elspeth BROCKS, MD       DISCUSSION: Patient is a 38 year old female scheduled for the above procedure.  History includes smoking (1PPD), HTN, pre-diabetes, IDA, eczema, migraines, cholecystectomy (05/05/2015), hysterectomy (08/03/2021).    Evaluated at the [facility] on 01/01/2024 by Walisiewicz, Kaitlyn, PA-C/Dr. Federico following 12/26/2023 ED visit for vomiting and acute cough (prescribed azithromycin  for PNA). CXR showed perihilar opacities and streaking. CTA chest showed low-attenuation suprahilar soft tissue mass extending to the level of the right hilum, increased since prior study from 01/19/2021. Mass measured 2.1 cm x 1.5 cm x 2.9 cm and previously was 1.4 cm x 1.1 cm x 2.3 cm. She also had a stable, likely benign 4 mm posterolateral LUL pulmonary nodule and 4 mm ill-defined pulmonary nodules within the superior segment of the RLL and posterior LLL. PET CT scan and CT surgery referral for consideration of mediastinoscopy with biopsy recommended.   01/03/2024 PET scan showed maximal SUV 4.8 of right hilar adenopathy. Differential diagnoses include lymphoma, sarcoidosis, and other infectious and inflammatory nodules. Dr. Kerrin felt adenopathy relatively inaccessible. Options to access it include endobronchial ultrasound and VATS approach. Obviously would like to avoid a major operation if possible. Therefore, even though less likely a definitive diagnosis I think an EBUS would be the appropriate first step. She wished to wait until after the New Year before proceeding.   Dr. Kerrin classified her Zubrod Score as 1: Restricted in physical strenuous activity but ambulatory, able to do out light  work.  Urgent care visit 02/16/2024 for febrile viral illness. Presented with fever (101.3), 3 episodes of vomiting, cough, sore throat, headache and nasal congestion x 2 days. She reported exposure to flu, although influenza A/B testing was negative. Tolerating liquids, but decrease intake. Mild relief of symptoms with Mucinex  and ibuprofen .  Dr. Chrystal nurse Donnella notified on 02/19/2024 of patient's recent febrile URI to discuss with Dr. Kerrin and follow-up with patient. Differential diagnosis includes lymphoma and procedure already delayed from November, so reportedly if patient improving the recommendation was to proceed as scheduled. Due to recent URI, I evaluated her during her 02/20/2024 PAT visit. She reported her symptoms started by 02/15/2024 and peaked on 02/16/2024. She denied fever since then. She had some intermittent DOE felt related to her URI, but no worsening over the last few days. She denied any remaining sore throat or nasal congestion. She has a baseline intermittent cough, but does report some worsening coughing, mostly at nighttime.  She had not noticed any wheezing, but has been using albuterol  about once a day. Cough is primarily dry with occasional small amount of phlegm. No hemoptysis. She is using Coricidin as needed. No significant coughing noted during PAT visit. No conversational dyspnea noted. No obvious hoarseness. Lungs sounds somewhat diminished, but I did not hear any wheezing, rhonchi, or crackles. Heart regular rhythm, no murmur noted. EKG ST at 101 bpm. O2 sat 97% on RA. CXR (ordered by surgeon) is in process.   [facility] RN staff are reportedly planning to follow-up with patient on 02/23/2024 to see if she continues to improve. I did discuss with her timing of procedure could depend on her recovery, so to let surgeon know if she develops  new or worsening symptoms before surgery. I also updated anesthesiologist Paul LOIS Norris, MD. Anesthesia team to evaluate  on the day of surgery.     VS: BP 117/78   Pulse (!) 108   Temp 36.8 C   Resp 18   Ht 5' 5 (1.651 m)   Wt 105.8 kg   LMP 06/27/2021   SpO2 97%   BMI 38.82 kg/m   PROVIDERS: Tanda Bleacher, MD is PCP  Federico Rush, MD is HEM-ONC  LABS: Labs reviewed: Acceptable for surgery. (all labs ordered are listed, but only abnormal results are displayed)  Labs Reviewed  COMPREHENSIVE METABOLIC PANEL WITH GFR - Abnormal; Notable for the following components:      Result Value   Potassium 3.0 (*)    Chloride 95 (*)    CO2 33 (*)    Glucose, Bld 129 (*)    All other components within normal limits  CBC  PROTIME-INR  APTT   A1c 6.3% on 01/08/2024.    IMAGES: CXR 02/20/2024: Report in process.   PET Scan 01/03/2024: IMPRESSION: 1. Right hilar adenopathy with associated right suprahilar and right hilar lymph nodes/nodules, maximal SUV 4.8 ( Deauville 4). 2. Small left level IIa lymph node, Deauville 4. 3. Chronic left maxillary sinusitis.   CTA Chest 12/27/2023: IMPRESSION: 1. No evidence of pulmonary embolism. 2. Mild right middle lobe and slightly nodular appearing right upper lobe infiltrates. 3. Mild, hazy appearing areas of ground-glass appearing lung parenchyma within the bilateral upper lobes, which may represent mild pulmonary edema. 4. Low-attenuation suprahilar soft tissue mass which extends to the level of the right hilum, mildly increased in size since the prior study. While this may represent interval increase in right hilar lymphadenopathy, further evaluation with a nuclear medicine PET/CT is recommended to further exclude the presence of an underlying neoplastic process. 5. Stable, likely benign 4 mm posterolateral left upper lobe pulmonary nodule. 6. Additional 4 mm ill-defined pulmonary nodules within the superior segment of the right lower lobe and posterior left lower lobe. No follow-up needed if patient is low-risk.This recommendation follows the  consensus statement: Guidelines for Management of Incidental Pulmonary Nodules Detected on CT Images: From the Fleischner Society 2017; Radiology 2017; 284:228-243. 7. Evidence of prior cholecystectomy.   EKG: 02/20/2024: ST at 101 bpm   CV: Echo 07/18/2013: Study Conclusions  - Left ventricle: The cavity size was normal. Wall thickness was    normal. Systolic function was normal. The estimated ejection    fraction was in the range of 55% to 60%. Wall motion was normal;    there were no regional wall motion abnormalities. Doppler    parameters are consistent with abnormal left ventricular    relaxation (grade 1 diastolic dysfunction).  - Left atrium: LA Volume/BSA= 29.2 ml/m2. The atrium was normal in    size.  - Right atrium: The atrium was mildly dilated.   Impressions:  - LVEF 55-60%, normal wall motion, mild diastolic dysfunction,    indeterminate LV filling pressure.    Past Medical History:  Diagnosis Date   Anemia    Hx of Iron Deficiency   Eczema    Hypertension    was put on medication end of 03/2015; states has caused headache and is not taking as prescribed   Migraines    Pneumonia    Pre-diabetes    Status post repeat low transverse cesarean section 07/23/2017   Please schedule this patient for Postpartum visit in: 6 weeks with the following provider: Any  provider For C/S patients schedule nurse incision check in weeks 2 weeks: yes High risk pregnancy complicated by: HTN Delivery mode:  CS Anticipated Birth Control:  BTL done PP PP Procedures needed: Incision check, BP check in 1 week Schedule Integrated BH visit: no    Status post tubal ligation 07/23/2017   Symptomatic cholelithiasis 04/2015    Past Surgical History:  Procedure Laterality Date   CESAREAN SECTION N/A 05/25/2013   Procedure: CESAREAN SECTION;  Surgeon: Delon CHRISTELLA Prude, DO;  Location: [facility];  Service: Obstetrics;  Laterality: N/A;   CESAREAN SECTION N/A 07/23/2017   Procedure: CESAREAN SECTION;   Surgeon: Fredirick Glenys RAMAN, MD;  Location: [facility] SU[facility];  Service: Obstetrics;  Laterality: N/A;   CHOLECYSTECTOMY N/A 05/05/2015   Procedure: LAPAROSCOPIC CHOLECYSTECTOMY;  Surgeon: Vicenta Poli, MD;  Location: [facility];  Service: General;  Laterality: N/A;   VAGINAL HYSTERECTOMY Bilateral 08/03/2021   Procedure: HYSTERECTOMY VAGINAL WITH BILATERAL SALPINGECTOMY;  Surgeon: Lorence Ozell CROME, MD;  Location: [facility];  Service: Gynecology;  Laterality: Bilateral;    MEDICATIONS:  albuterol  (VENTOLIN  HFA) 108 (90 Base) MCG/ACT inhaler   hydrochlorothiazide  (HYDRODIURIL ) 25 MG tablet   No current facility-administered medications for this encounter.   Isaiah Ruder, PA-C Surgical Short Stay/Anesthesiology [facility] Phone [phone] [facility] Phone [phone] 02/20/2024 6:21 PM

## 2024-02-20 NOTE — Anesthesia Preprocedure Evaluation (Addendum)
"                                    Anesthesia Evaluation  Patient identified by MRN, date of birth, ID band Patient awake    Reviewed: Allergy & Precautions, H&P , NPO status , Patient's Chart, lab work & pertinent test results  Airway Mallampati: II  TM Distance: >3 FB Neck ROM: Full    Dental no notable dental hx.    Pulmonary Current Smoker and Patient abstained from smoking. Hilar adenopathy    Pulmonary exam normal breath sounds clear to auscultation       Cardiovascular hypertension, (-) angina (-) Past MI negative cardio ROS Normal cardiovascular exam Rhythm:Regular Rate:Normal     Neuro/Psych  Headaches, neg Seizures  negative psych ROS   GI/Hepatic Neg liver ROS,GERD  ,,  Endo/Other  negative endocrine ROS    Renal/GU negative Renal ROS  negative genitourinary   Musculoskeletal negative musculoskeletal ROS (+)    Abdominal   Peds negative pediatric ROS (+)  Hematology negative hematology ROS (+)   Anesthesia Other Findings   Reproductive/Obstetrics negative OB ROS                              Anesthesia Physical Anesthesia Plan  ASA: 2  Anesthesia Plan: General   Post-op Pain Management:    Induction: Intravenous  PONV Risk Score and Plan: 2 and Ondansetron , Dexamethasone  and Treatment may vary due to age or medical condition  Airway Management Planned: Oral ETT  Additional Equipment: None  Intra-op Plan:   Post-operative Plan: Extubation in OR  Informed Consent: I have reviewed the patients History and Physical, chart, labs and discussed the procedure including the risks, benefits and alternatives for the proposed anesthesia with the patient or authorized representative who has indicated his/her understanding and acceptance.     Dental advisory given  Plan Discussed with: CRNA  Anesthesia Plan Comments: (PAT note written 02/20/2024 by Isaiah Ruder, PA-C.  )         Anesthesia  Quick Evaluation  "

## 2024-02-26 ENCOUNTER — Encounter (HOSPITAL_COMMUNITY): Payer: Self-pay | Admitting: Vascular Surgery

## 2024-02-26 ENCOUNTER — Encounter (HOSPITAL_COMMUNITY): Payer: Self-pay | Admitting: Thoracic Surgery (Cardiothoracic Vascular Surgery)

## 2024-02-26 ENCOUNTER — Encounter (HOSPITAL_COMMUNITY)
Admission: RE | Disposition: A | Payer: Self-pay | Source: Home / Self Care | Attending: Thoracic Surgery (Cardiothoracic Vascular Surgery)

## 2024-02-26 ENCOUNTER — Ambulatory Visit (HOSPITAL_COMMUNITY): Admitting: Certified Registered Nurse Anesthetist

## 2024-02-26 ENCOUNTER — Ambulatory Visit (HOSPITAL_COMMUNITY)
Admission: RE | Admit: 2024-02-26 | Discharge: 2024-02-26 | Disposition: A | Discharge status: Home / Self Care | Attending: Thoracic Surgery (Cardiothoracic Vascular Surgery) | Admitting: Thoracic Surgery (Cardiothoracic Vascular Surgery)

## 2024-02-26 DIAGNOSIS — K219 Gastro-esophageal reflux disease without esophagitis: Secondary | ICD-10-CM | POA: Diagnosis not present

## 2024-02-26 DIAGNOSIS — I10 Essential (primary) hypertension: Secondary | ICD-10-CM | POA: Insufficient documentation

## 2024-02-26 DIAGNOSIS — R7303 Prediabetes: Secondary | ICD-10-CM | POA: Diagnosis not present

## 2024-02-26 DIAGNOSIS — R590 Localized enlarged lymph nodes: Secondary | ICD-10-CM

## 2024-02-26 DIAGNOSIS — R918 Other nonspecific abnormal finding of lung field: Secondary | ICD-10-CM

## 2024-02-26 DIAGNOSIS — F17210 Nicotine dependence, cigarettes, uncomplicated: Secondary | ICD-10-CM | POA: Insufficient documentation

## 2024-02-26 DIAGNOSIS — E669 Obesity, unspecified: Secondary | ICD-10-CM | POA: Diagnosis not present

## 2024-02-26 DIAGNOSIS — Z6839 Body mass index (BMI) 39.0-39.9, adult: Secondary | ICD-10-CM | POA: Insufficient documentation

## 2024-02-26 HISTORY — PX: VIDEO BRONCHOSCOPY WITH ENDOBRONCHIAL ULTRASOUND: SHX6177

## 2024-02-26 SURGERY — BRONCHOSCOPY, WITH EBUS
Anesthesia: General

## 2024-02-26 MED ORDER — FENTANYL CITRATE (PF) 100 MCG/2ML IJ SOLN
25.0000 ug | INTRAMUSCULAR | Status: DC | PRN
  Administered 2024-02-26: 25 ug via INTRAVENOUS

## 2024-02-26 MED ORDER — ROCURONIUM BROMIDE 10 MG/ML (PF) SYRINGE
PREFILLED_SYRINGE | INTRAVENOUS | Status: DC | PRN
  Administered 2024-02-26: 60 mg via INTRAVENOUS

## 2024-02-26 MED ORDER — DROPERIDOL 2.5 MG/ML IJ SOLN: 0.6250 mg | Freq: Once | INTRAMUSCULAR | Status: DC | PRN

## 2024-02-26 MED ORDER — FENTANYL CITRATE (PF) 250 MCG/5ML IJ SOLN
INTRAMUSCULAR | Status: AC
  Filled 2024-02-26: qty 5

## 2024-02-26 MED ORDER — OXYCODONE HCL 5 MG/5ML PO SOLN: 5.0000 mg | Freq: Once | ORAL | Status: DC | PRN

## 2024-02-26 MED ORDER — CHLORHEXIDINE GLUCONATE 0.12 % MT SOLN
15.0000 mL | Freq: Once | OROMUCOSAL | Status: AC
  Administered 2024-02-26: 15 mL via OROMUCOSAL
  Filled 2024-02-26: qty 15

## 2024-02-26 MED ORDER — 0.9 % SODIUM CHLORIDE (POUR BTL) OPTIME
TOPICAL | Status: DC | PRN
  Administered 2024-02-26: 1000 mL

## 2024-02-26 MED ORDER — ONDANSETRON HCL 4 MG/2ML IJ SOLN
INTRAMUSCULAR | Status: DC | PRN
  Administered 2024-02-26: 4 mg via INTRAVENOUS

## 2024-02-26 MED ORDER — ACETAMINOPHEN 10 MG/ML IV SOLN
INTRAVENOUS | Status: AC
  Filled 2024-02-26: qty 100

## 2024-02-26 MED ORDER — EPINEPHRINE PF 1 MG/ML IJ SOLN
INTRAMUSCULAR | Status: DC | PRN
  Administered 2024-02-26: 1 mg

## 2024-02-26 MED ORDER — PROPOFOL 10 MG/ML IV BOLUS
INTRAVENOUS | Status: DC | PRN
  Administered 2024-02-26: 30 mg via INTRAVENOUS
  Administered 2024-02-26: 40 mg via INTRAVENOUS
  Administered 2024-02-26: 160 mg via INTRAVENOUS
  Administered 2024-02-26: 20 mg via INTRAVENOUS

## 2024-02-26 MED ORDER — FENTANYL CITRATE (PF) 100 MCG/2ML IJ SOLN
INTRAMUSCULAR | Status: AC
  Filled 2024-02-26: qty 2

## 2024-02-26 MED ORDER — EPINEPHRINE PF 1 MG/ML IJ SOLN
INTRAMUSCULAR | Status: AC
  Filled 2024-02-26: qty 1

## 2024-02-26 MED ORDER — ACETAMINOPHEN 10 MG/ML IV SOLN
1000.0000 mg | Freq: Once | INTRAVENOUS | Status: DC | PRN
  Administered 2024-02-26: 1000 mg via INTRAVENOUS

## 2024-02-26 MED ORDER — DEXAMETHASONE SOD PHOSPHATE PF 10 MG/ML IJ SOLN
INTRAMUSCULAR | Status: DC | PRN
  Administered 2024-02-26: 10 mg via INTRAVENOUS

## 2024-02-26 MED ORDER — ORAL CARE MOUTH RINSE: 15.0000 mL | Freq: Once | OROMUCOSAL | Status: AC

## 2024-02-26 MED ORDER — SUGAMMADEX SODIUM 200 MG/2ML IV SOLN
INTRAVENOUS | Status: DC | PRN
  Administered 2024-02-26: 100 mg via INTRAVENOUS
  Administered 2024-02-26 (×2): 50 mg via INTRAVENOUS

## 2024-02-26 MED ORDER — PROPOFOL 10 MG/ML IV BOLUS
INTRAVENOUS | Status: AC
  Filled 2024-02-26: qty 20

## 2024-02-26 MED ORDER — LACTATED RINGERS IV SOLN: INTRAVENOUS | Status: DC

## 2024-02-26 MED ORDER — FENTANYL CITRATE (PF) 250 MCG/5ML IJ SOLN
INTRAMUSCULAR | Status: DC | PRN
  Administered 2024-02-26 (×2): 50 ug via INTRAVENOUS
  Administered 2024-02-26: 100 ug via INTRAVENOUS
  Administered 2024-02-26: 50 ug via INTRAVENOUS

## 2024-02-26 MED ORDER — LIDOCAINE 2% (20 MG/ML) 5 ML SYRINGE
INTRAMUSCULAR | Status: DC | PRN
  Administered 2024-02-26: 80 mg via INTRAVENOUS

## 2024-02-26 MED ORDER — MIDAZOLAM HCL 2 MG/2ML IJ SOLN
INTRAMUSCULAR | Status: AC
  Filled 2024-02-26: qty 2

## 2024-02-26 MED ORDER — OXYCODONE HCL 5 MG PO TABS: 5.0000 mg | ORAL_TABLET | Freq: Once | ORAL | Status: DC | PRN

## 2024-02-26 MED ORDER — MIDAZOLAM HCL (PF) 2 MG/2ML IJ SOLN
INTRAMUSCULAR | Status: DC | PRN
  Administered 2024-02-26: 2 mg via INTRAVENOUS

## 2024-02-26 SURGICAL SUPPLY — 33 items
ADAPTER VALVE BIOPSY EBUS (MISCELLANEOUS) IMPLANT
BRUSH CYTOL CELLEBRITY 1.5X140 (MISCELLANEOUS) IMPLANT
CANISTER SUCTION 3000ML PPV (SUCTIONS) ×1 IMPLANT
CNTNR URN SCR LID CUP LEK RST (MISCELLANEOUS) ×1 IMPLANT
COVER BACK TABLE 60X90IN (DRAPES) ×1 IMPLANT
FILTER STRAW FLUID ASPIR (MISCELLANEOUS) IMPLANT
FORCEPS BIOP RJ4 1.8 (CUTTING FORCEPS) IMPLANT
FORCEPS RADIAL JAW LRG 4 PULM (INSTRUMENTS) IMPLANT
GAUZE 4X4 16PLY ~~LOC~~+RFID DBL (SPONGE) ×1 IMPLANT
GAUZE SPONGE 4X4 12PLY STRL (GAUZE/BANDAGES/DRESSINGS) IMPLANT
GLOVE SS BIOGEL STRL SZ 7.5 (GLOVE) ×2 IMPLANT
GOWN STRL REUS W/ TWL XL LVL3 (GOWN DISPOSABLE) ×1 IMPLANT
KIT CLEAN ENDO COMPLIANCE (KITS) ×2 IMPLANT
KIT TURNOVER KIT B (KITS) ×1 IMPLANT
MARKER SKIN DUAL TIP RULER LAB (MISCELLANEOUS) ×1 IMPLANT
NEEDLE ASPIRATION VIZISHOT 21G (NEEDLE) ×1 IMPLANT
NEEDLE BLUNT 18X1 FOR OR ONLY (NEEDLE) IMPLANT
NEEDLE SUPERTRX PREMARK BIOPSY (NEEDLE) IMPLANT
OIL SILICONE PENTAX (PARTS (SERVICE/REPAIRS)) ×1 IMPLANT
PAD ARMBOARD POSITIONER FOAM (MISCELLANEOUS) ×2 IMPLANT
SOLN 0.9% NACL POUR BTL 1000ML (IV SOLUTION) ×1 IMPLANT
SOLN STERILE WATER BTL 1000 ML (IV SOLUTION) ×1 IMPLANT
SYR 20ML ECCENTRIC (SYRINGE) ×2 IMPLANT
SYR 20ML LL LF (SYRINGE) ×1 IMPLANT
SYR 3ML LL SCALE MARK (SYRINGE) IMPLANT
SYR 5ML LL (SYRINGE) ×1 IMPLANT
SYR 5ML LUER SLIP (SYRINGE) ×1 IMPLANT
TOWEL GREEN STERILE (TOWEL DISPOSABLE) ×1 IMPLANT
TOWEL GREEN STERILE FF (TOWEL DISPOSABLE) ×1 IMPLANT
TRAP SPECIMEN MUCUS 40CC (MISCELLANEOUS) ×1 IMPLANT
TUBE CONNECTING 20X1/4 (TUBING) ×1 IMPLANT
VALVE BIOPSY SINGLE USE (MISCELLANEOUS) ×1 IMPLANT
VALVE SUCTION BRONCHIO DISP (MISCELLANEOUS) ×1 IMPLANT

## 2024-02-26 NOTE — Anesthesia Postprocedure Evaluation (Signed)
"   Anesthesia Post Note  Patient: Systems Developer  Procedure(s) Performed: BRONCHOSCOPY, WITH EBUS     Patient location during evaluation: PACU Anesthesia Type: General Level of consciousness: awake and alert Pain management: pain level controlled Vital Signs Assessment: post-procedure vital signs reviewed and stable Respiratory status: spontaneous breathing, nonlabored ventilation, respiratory function stable and patient connected to nasal cannula oxygen Cardiovascular status: blood pressure returned to baseline and stable Postop Assessment: no apparent nausea or vomiting Anesthetic complications: no   No notable events documented.  Last Vitals:  Vitals:   02/26/24 1430 02/26/24 1445  BP: 111/74 116/78  Pulse: (!) 105 (!) 102  Resp: 18 18  Temp:  37 C  SpO2: 96% 98%    Last Pain:  Vitals:   02/26/24 1445  TempSrc:   PainSc: 0-No pain                 Thom JONELLE Peoples      "

## 2024-02-26 NOTE — Anesthesia Procedure Notes (Signed)
 Procedure Name: Intubation Date/Time: 02/26/2024 11:45 AM  Performed by: Erma Thom SAUNDERS, MDPre-anesthesia Checklist: Patient identified, Emergency Drugs available, Suction available and Patient being monitored Patient Re-evaluated:Patient Re-evaluated prior to induction Oxygen Delivery Method: Circle System Utilized Preoxygenation: Pre-oxygenation with 100% oxygen Induction Type: IV induction Ventilation: Mask ventilation without difficulty Laryngoscope Size: Miller and 3 Grade View: Grade II Tube type: Oral Tube size: 8.5 mm Number of attempts: 1 Airway Equipment and Method: Stylet and Oral airway Placement Confirmation: ETT inserted through vocal cords under direct vision, positive ETCO2 and breath sounds checked- equal and bilateral Secured at: 23 cm Tube secured with: Tape Dental Injury: Teeth and Oropharynx as per pre-operative assessment

## 2024-02-26 NOTE — Op Note (Signed)
 NAMESHENAE, BONANNO MEDICAL RECORD NO: [id number] ACCOUNT NO: [id number] DATE OF BIRTH: [birth year] FACILITY: [facility] LOCATION: [facility] PHYSICIAN: Elspeth BROCKS. Kerrin, MD  Operative Report   DATE OF PROCEDURE: 02/26/2024  PREOPERATIVE DIAGNOSIS:  Right hilar adenopathy.  POSTOPERATIVE DIAGNOSIS:  Right hilar adenopathy.  PROCEDURE:   Bronchoscopy with brushings and endobronchial biopsies and  Endoscopic ultrasound with needle aspirations.  SURGEON:  Elspeth BROCKS. Kerrin, MD  ASSISTANT:  Maryelizabeth Purpura, MD, [facility] resident.  ANESTHESIA:  General.  FINDINGS:  Enlarged node easily visible.  All aspirations showed blood on initial inspection.  Extrinsic narrowing of right upper lobe airways with some mucosal edema.  Brushing showed benign bronchial cells.  CLINICAL NOTE:  Ms. Gwinner is a 39 year old woman with longstanding right hilar adenopathy.  She recently had a CT which showed an increase in size of the nodes and a PET CT showed there was hypermetabolic activity.  She was offered endobronchial ultrasound to sample the nodes although there was a significant chance this would be non-diagnostic but the only other alternative would be to do a VATS to biopsy the nodes.  The indications, risks, benefits and alternatives were discussed in detail with the patient.  She understood and accepted the risk and agreed to proceed.  OPERATIVE NOTE:  Ms. Siddall was brought to the operating room on 02/26/2024.  She had induction of general anesthesia and was intubated.  Sequential compression devices were placed on the calves for DVT prophylaxis, a Bair Hugger was placed for active warming.  A timeout was performed.  Flexible fiberoptic bronchoscopy was performed via the endotracheal tube.  There were extensive thin secretions.  The right upper lobe airways had some extrinsic narrowing and there was some mucosal edema.  Otherwise the airways were within normal limits.  The endobronchial  ultrasound scope was inserted.  Inspection in the right hilar region showed a large level 11R node.  There actually were multiple enlarged nodes in that area.  There appeared to be a good window for aspiration and aspirations were performed of the largest node.  Aspirations were performed with real time ultrasound imaging.  Bleeding was noted endobronchially and topical epinephrine  was applied on multiple occasions.  Quick prep on the initial specimen showed blood.  Multiple additional specimens were obtained both for slides and for cell block.  A total of 6 aspirations were performed from slightly different angles and in different nodes in that area.  All of the slides did show bloody specimens.  The endobronchial ultrasound scope was removed.  The bronchoscope was replaced and directed to the right upper lobe bronchus.  Brushings were performed along the edematous mucosa but showed only benign bronchial cells.  Biopsies were obtained from the right upper lobe carina and sent for permanent pathology.  A final inspection was made and  there was no ongoing bleeding.  A decision was made to wait for the results of the final pathology to see if we will get a definitive diagnosis or if the patient will require VATS.  The patient was extubated in the operating room and taken to the post-anesthetic care unit in good condition.    NIK D: 02/26/2024 6:31:29 pm T: 02/26/2024 8:11:00 pm  JOB: [id number]/ [id number]

## 2024-02-26 NOTE — Transfer of Care (Signed)
 Immediate Anesthesia Transfer of Care Note  Patient: Sally Garner  Procedure(s) Performed: BRONCHOSCOPY, WITH EBUS  Patient Location: PACU  Anesthesia Type:General  Level of Consciousness: patient cooperative and responds to stimulation  Airway & Oxygen Therapy: Patient Spontanous Breathing and Patient connected to face mask oxygen  Post-op Assessment: Report given to RN, Post -op Vital signs reviewed and stable, and Patient moving all extremities X 4  Post vital signs: Reviewed and stable  Last Vitals:  Vitals Value Taken Time  BP 148/91 02/26/24 13:17  Temp    Pulse 133 02/26/24 13:20  Resp 23 02/26/24 13:20  SpO2 100 % 02/26/24 13:20  Vitals shown include unfiled device data.  Last Pain:  Vitals:   02/26/24 0919  TempSrc: Oral         Complications: No notable events documented.

## 2024-02-26 NOTE — H&P (Signed)
 " PCP is Tanda Bleacher, MD Referring Provider is Walisiewicz, Kaitlyn E,*       Chief Complaint  Patient presents with   Hilar mass      New patient consult, review all studies      HPI: [name] is sent for consultation regarding right hilar adenopathy.   Sabine Tenenbaum is a 39 year old woman with a past medical history significant for obesity, tobacco use, hypertension, reflux, prediabetes, eczema, anemia, and right hilar adenopathy.   In 2022 she presented to the emergency room after having a severe cough.  She went into a coughing spasm that ultimately led to her throwing up.  As part of her evaluation she had a CT of the chest which showed a right hilar mass consistent with adenopathy.  She was treated presumptively for pneumonia and did not realize there was anything that needed follow-up.   She has continued to have a dry cough ever since.  Recently she had another episode that led to emesis and trip to the emergency room.  A repeat CT showed slight increase in size in the right hilar adenopathy.   She has dry cough but no wheezing.  She does get short of breath with activity sometimes.  Complains of loss of appetite and decreased energy.  She does have chronic headaches.  She has difficulty seeing at night but no other visual changes.  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   Zubrod Score: At the time of surgery this patients most appropriate activity status/level should be described as: []     0    Normal activity, no symptoms [x]     1    Restricted in physical strenuous activity but ambulatory, able to do out light work []     2    Ambulatory and capable of self care, unable to do work activities, up and about >50 % of waking hours                              []     3    Only limited self care, in bed greater than 50% of waking hours []     4    Completely disabled, no self care, confined to bed or chair []     5    Moribund         Past  Medical History:  Diagnosis Date   Anemia     Eczema     Hypertension      was put on medication end of 03/2015; states has caused headache and is not taking as prescribed   Migraines     Pre-diabetes     Status post repeat low transverse cesarean section 07/23/2017    Please schedule this patient for Postpartum visit in: 6 weeks with the following provider: Any provider For C/S patients schedule nurse incision check in weeks 2 weeks: yes High risk pregnancy complicated by: HTN Delivery mode:  CS Anticipated Birth Control:  BTL done PP PP Procedures needed: Incision check, BP check in 1 week Schedule Integrated BH visit: no    Status post tubal ligation 07/23/2017   Symptomatic cholelithiasis 04/2015               Past Surgical History:  Procedure Laterality Date   CESAREAN SECTION N/A 05/25/2013    Procedure: CESAREAN SECTION;  Surgeon: Delon CHRISTELLA Prude, DO;  Location: [facility];  Service: Obstetrics;  Laterality: N/A;   CESAREAN SECTION N/A 07/23/2017    Procedure: CESAREAN SECTION;  Surgeon: Fredirick Glenys RAMAN, MD;  Location: [facility];  Service: Obstetrics;  Laterality: N/A;   CHOLECYSTECTOMY N/A 05/05/2015    Procedure: LAPAROSCOPIC CHOLECYSTECTOMY;  Surgeon: Vicenta Poli, MD;  Location: [facility];  Service: General;  Laterality: N/A;   VAGINAL HYSTERECTOMY Bilateral 08/03/2021    Procedure: HYSTERECTOMY VAGINAL WITH BILATERAL SALPINGECTOMY;  Surgeon: Lorence Ozell CROME, MD;  Location: [facility];  Service: Gynecology;  Laterality: Bilateral;               Family History  Problem Relation Age of Onset   Birth defects Brother          polydactyl   Hypertension Maternal Aunt     Hypertension Maternal Uncle     Cancer Paternal Aunt          breast cancer          Social History Social History  Social History         Tobacco Use   Smoking status: Every Day      Current packs/day: 1.00      Average packs/day: 1 pack/day for 3.0 years (3.0 ttl pk-yrs)       Types: Cigarettes   Smokeless tobacco: Never  Vaping Use   Vaping status: Never Used  Substance Use Topics   Alcohol use: Not Currently   Drug use: No              Current Outpatient Medications  Medication Sig Dispense Refill   clobetasol  ointment (TEMOVATE ) 0.05 % Apply 1 Application topically 2 (two) times daily. Apply to hands/arms 30 g 0   diclofenac  Sodium (VOLTAREN ) 1 % GEL Apply 2 g topically 4 (four) times daily. 100 g 0   hydrochlorothiazide  (HYDRODIURIL ) 25 MG tablet Take 1 tablet (25 mg total) by mouth daily. 90 tablet 1   ibuprofen  (ADVIL ) 800 MG tablet Take 1 tablet (800 mg total) by mouth every 8 (eight) hours as needed for moderate pain (pain score 4-6). 21 tablet 0   methocarbamol  (ROBAXIN ) 500 MG tablet Take 1 tablet (500 mg total) by mouth every 6 (six) hours as needed for muscle spasms. 20 tablet 0   metroNIDAZOLE  (FLAGYL ) 500 MG tablet Take 1 tablet (500 mg total) by mouth 2 (two) times daily. 14 tablet 0   naproxen  (NAPROSYN ) 500 MG tablet Take 1 tablet (500 mg total) by mouth 2 (two) times daily. 30 tablet 0   ondansetron  (ZOFRAN -ODT) 4 MG disintegrating tablet Take 1 tablet (4 mg total) by mouth every 8 (eight) hours as needed for nausea or vomiting. 20 tablet 0   potassium chloride  SA (KLOR-CON  M) 20 MEQ tablet Take 1 tablet (20 mEq total) by mouth daily for 7 days. 7 tablet 0   terbinafine  (LAMISIL ) 250 MG tablet Take 1 tablet (250 mg total) by mouth daily. 14 tablet 0      No current facility-administered medications for this visit.        Allergies  No Known Allergies     Review of Systems  Constitutional:  Positive for appetite change and fatigue.  HENT:  Negative for trouble swallowing and voice change.   Eyes:  Positive for visual disturbance.  Respiratory:  Positive for cough and shortness of breath. Negative for wheezing.   Cardiovascular:  Negative for chest pain and leg swelling.  Gastrointestinal:  Negative for abdominal pain.   Genitourinary:  Negative for difficulty urinating.  Musculoskeletal:  Negative for arthralgias and gait problem.  Skin:  Positive for rash.       Itching  Neurological:  Positive for numbness and headaches.  All other systems reviewed and are negative.    BP (!) 129/90 (BP Location: Left Arm, Patient Position: Sitting, Cuff Size: Large)   Pulse 94   Resp 20   Ht 5' 4 (1.626 m)   Wt 243 lb 11.2 oz (110.5 kg)   LMP 06/27/2021   SpO2 100% Comment: RA  BMI 41.83 kg/m  Physical Exam Vitals reviewed.  Constitutional:      General: She is not in acute distress.    Appearance: She is obese.  HENT:     Head: Normocephalic and atraumatic.  Eyes:     Extraocular Movements: Extraocular movements intact.  Cardiovascular:     Rate and Rhythm: Normal rate and regular rhythm.     Heart sounds: Normal heart sounds. No murmur heard.    No friction rub. No gallop.  Pulmonary:     Effort: Pulmonary effort is normal. No respiratory distress.     Breath sounds: Normal breath sounds. No wheezing or rales.  Abdominal:     General: There is no distension.     Palpations: Abdomen is soft.     Tenderness: There is no abdominal tenderness.  Musculoskeletal:     Cervical back: Neck supple.  Lymphadenopathy:     Cervical: No cervical adenopathy.  Skin:    General: Skin is warm and dry.  Neurological:     General: No focal deficit present.     Mental Status: She is alert and oriented to person, place, and time.     Cranial Nerves: No cranial nerve deficit.     Motor: No weakness.     Diagnostic Tests: CT ANGIOGRAPHY CHEST WITH CONTRAST   TECHNIQUE: Multidetector CT imaging of the chest was performed using the standard protocol during bolus administration of intravenous contrast. Multiplanar CT image reconstructions and MIPs were obtained to evaluate the vascular anatomy.   RADIATION DOSE REDUCTION: This exam was performed according to the departmental dose-optimization program which  includes automated exposure control, adjustment of the mA and/or kV according to patient size and/or use of iterative reconstruction technique.   CONTRAST:  OMNIPAQUE  IOHEXOL  350 MG/ML SOLN   COMPARISON:  January 19, 2021   FINDINGS: Cardiovascular: Satisfactory opacification of the pulmonary arteries to the segmental level. No evidence of pulmonary embolism. Normal heart size. No pericardial effusion.   Mediastinum/Nodes: There is stable right hilar lymphadenopathy. Thyroid  gland, trachea, and esophagus demonstrate no significant findings.   Lungs/Pleura: There is a 2.1 cm x 1.5 cm x 2.9 cm low-attenuation (approximately 49.55 Hounsfield units) suprahilar soft tissue mass which extends to the level of the right hilum (axial CT images 40 through 50, CT series 4. This is present on the prior study and measured 1.4 cm x 1.1 cm x 2.3 cm on the prior study.   Mild, hazy appearing areas of ground-glass appearing lung parenchyma are seen within the bilateral upper lobes.   Mild right middle lobe infiltrate is seen. Very mild, slightly nodular appearing right upper lobe infiltrate is also noted.   A stable, likely benign 4 mm posterolateral left upper lobe pulmonary nodule is seen (axial CT image 52, CT series 12).   There is an ill-defined 3 mm posterior right upper lobe pulmonary nodule (axial CT image 59, CT series 12). An additional 4 mm ill-defined pulmonary nodule is seen within  the adjacent portion of the superior segment of the right lower lobe.   A 4 mm posterior left lower lobe pulmonary nodule is present (axial CT image 59, CT series 12).   No pleural effusion or pneumothorax is identified.   Upper Abdomen: Surgical clips are seen within the gallbladder fossa.   Musculoskeletal: No chest wall abnormality. No acute or significant osseous findings.   Review of the MIP images confirms the above findings.   IMPRESSION: 1. No evidence of pulmonary embolism. 2.  Mild right middle lobe and slightly nodular appearing right upper lobe infiltrates. 3. Mild, hazy appearing areas of ground-glass appearing lung parenchyma within the bilateral upper lobes, which may represent mild pulmonary edema. 4. Low-attenuation suprahilar soft tissue mass which extends to the level of the right hilum, mildly increased in size since the prior study. While this may represent interval increase in right hilar lymphadenopathy, further evaluation with a nuclear medicine PET/CT is recommended to further exclude the presence of an underlying neoplastic process. 5. Stable, likely benign 4 mm posterolateral left upper lobe pulmonary nodule. 6. Additional 4 mm ill-defined pulmonary nodules within the superior segment of the right lower lobe and posterior left lower lobe. No follow-up needed if patient is low-risk.This recommendation follows the consensus statement: Guidelines for Management of Incidental Pulmonary Nodules Detected on CT Images: From the Fleischner Society 2017; Radiology 2017; 284:228-243. 7. Evidence of prior cholecystectomy.     Electronically Signed   By: Suzen Dials M.D.   On: 12/27/2023 01:54 PET AND CT SKULL BASE TO MID THIGH 01/03/2024 04:31:56 PM   TECHNIQUE:   RADIOPHARMACEUTICAL: 12 mCi F-18 FDG Uptake time 60 minutes. Glucose level 111 mg/dl. Blood pool SUV 2.6; hepatic parenchymal SUV 3.4.   PET imaging was acquired from the base of the skull to the mid thighs. Non-contrast enhanced computed tomography was obtained for attenuation correction and anatomic localization.   COMPARISON: Chest CT 12/27/2023.   CLINICAL HISTORY: Right hilar mass on CT scan of 12/27/2023.   FINDINGS:   HEAD AND NECK: Symmetric high activity in the palatine tonsillar tissues, maximum SUV 11.4 on the right and 9.8 on the left, presumably physiologic given the symmetry. Left level IIa lymph node 0.7 cm in short axis on image 22 series 4 with  maximum SUV 4.6, Deauville 4. Chronic left maxillary sinusitis.   CHEST: 1.4 cm short axis right suprahilar lymph node/nodule with maximum SUV 4.1, Deauville 4. Ill defined right hilar lymph node with maximum SUV 4.8, Deauville 4. Suspected mild air trapping posteriorly in the right upper lobe. Up previous small nodules in the 3 to 4 mm range are only faintly seen today and are not appreciably hypermetabolic but are below sensitive PET CT size thresholds.   ABDOMEN AND PELVIS: The spleen appears unremarkable. Cholecystectomy. No metabolically active intraperitoneal mass. No metabolically active lymphadenopathy. Physiologic activity within the gastrointestinal and genitourinary systems.   BONES AND SOFT TISSUE: Mild heterogeneity of marrow activity in the skeleton without focal skeletal lesion identified. No abnormal FDG activity localizes to the bones. No metabolically active aggressive osseous lesion.   IMPRESSION: 1. Right hilar adenopathy with associated right suprahilar and right hilar lymph nodes/nodules, maximal SUV 4.8 ( Deauville 4). 2. Small left level IIa lymph node, Deauville 4. 3. Chronic left maxillary sinusitis.   Electronically signed by: Ryan Salvage MD 01/03/2024 05:27 PM EST RP Workstation: [id number]   I personally reviewed the CT and PET/CT images.  There is right hilar adenopathy that is hypermetabolic with  an SUV of 4.8.  Largest area is minimally increased in size in comparison to CT from 2022.  Multiple tiny lung nodules.   Impression: Sally Garner is a 39 year old woman with a past medical history significant for obesity, tobacco use, hypertension, reflux, prediabetes, eczema, anemia, and right hilar adenopathy.   Right hilar adenopathy-this increased on very mildly in size dating back to 2022.  Differential diagnosis includes lymphoma, sarcoidosis, and other infectious and inflammatory nodules.   Unfortunately, the adenopathy is relatively  inaccessible.  Options to access it include endobronchial ultrasound and VATS approach.  Obviously would like to avoid a major operation if possible.  Therefore, even though less likely a definitive diagnosis I think an EBUS would be the appropriate first step.   I discussed these issues with Ms. Jolliffe.  She understands and is in agreement.   I described the postoperative procedure bronchoscopy and endobronchial ultrasound with Ms. Gaudin.  She understands the general nature of the procedure including the need for general anesthesia.  She understands this is an endoscopic and will be done on an outpatient basis.  I informed her of the indications, risks, benefits, and alternatives.  She understands the risks include, but not limited to MI, DVT, PE, bleeding, pneumothorax, and failure to make a diagnosis.   She accepts the risk and agrees to proceed.  Would like to wait until after New Year's to do the procedure.   Plan: Will call patient to schedule bronchoscopy and endobronchial ultrasound   Elspeth JAYSON Millers, MD [facility] [phone]          Electronically signed by Millers Elspeth JAYSON, MD at 01/23/2024  3:43 PM  No interval change "

## 2024-02-26 NOTE — Interval H&P Note (Signed)
 History and Physical Interval Note:  02/26/2024 10:59 AM  Sally Garner  has presented today for surgery, with the diagnosis of RIGHT HILAR ADENOPATHY.  The various methods of treatment have been discussed with the patient and family. After consideration of risks, benefits and other options for treatment, the patient has consented to  Procedures: BRONCHOSCOPY, WITH EBUS (N/A) as a surgical intervention.  The patient's history has been reviewed, patient examined, no change in status, stable for surgery.  I have reviewed the patient's chart and labs.  Questions were answered to the patient's satisfaction.     Elspeth JAYSON Millers

## 2024-02-26 NOTE — Discharge Instructions (Signed)
 Do not drive or engage in heavy physical activity for 24 hours  You may use acetaminophen  (Tylenol ) if needed for discomfort.  You may also use over-the-counter cough medication and/ or throat lozenges if needed.  You may cough up small amounts of blood over the next few days.  Call [phone] if you have fever > 101 F, chest pain, shortness of breath or cough up more than a tablespoon of blood.  My office will contact you with a follow up appointment for next week to discuss the results

## 2024-02-26 NOTE — Progress Notes (Signed)
 75mcg Fentanyl  Wasted in stericycle with Edsel Loll RN  Marty Needles, RN

## 2024-02-26 NOTE — Brief Op Note (Signed)
 02/26/2024  1:31 PM  PATIENT:  Sally Garner  39 y.o. female  PRE-OPERATIVE DIAGNOSIS:  RIGHT HILAR ADENOPATHY  POST-OPERATIVE DIAGNOSIS:  RIGHT HILAR ADENOPATHY  PROCEDURE:  Procedures: BRONCHOSCOPY, WITH EBUS (N/A) Needle aspirations, brushings and endobronchial biopsies  SURGEON:  Surgeons and Role:    * Kerrin Elspeth BROCKS, MD - Primary  PHYSICIAN ASSISTANT:   ASSISTANTS: Maryelizabeth Purpura, MD  ANESTHESIA:   general  EBL:  minimal   BLOOD ADMINISTERED:none  DRAINS: none   LOCAL MEDICATIONS USED:  NONE  SPECIMEN:  Source of Specimen:  Level 11R node, right upper lobe bronchus  DISPOSITION OF SPECIMEN:  PATHOLOGY  COUNTS:  NO endoscopic  TOURNIQUET:  * No tourniquets in log *  DICTATION: .Other Dictation: Dictation Number -  PLAN OF CARE: Discharge to home after PACU  PATIENT DISPOSITION:  PACU - hemodynamically stable.   Delay start of Pharmacological VTE agent (>24hrs) due to surgical blood loss or risk of bleeding: not applicable

## 2024-02-27 ENCOUNTER — Encounter (HOSPITAL_COMMUNITY): Payer: Self-pay | Admitting: Thoracic Surgery (Cardiothoracic Vascular Surgery)

## 2024-02-27 LAB — ACID FAST SMEAR (AFB, MYCOBACTERIA): Acid Fast Smear: NEGATIVE

## 2024-02-28 LAB — CYTOLOGY - NON PAP

## 2024-03-02 LAB — AEROBIC/ANAEROBIC CULTURE W GRAM STAIN (SURGICAL/DEEP WOUND)
Culture: NO GROWTH
Gram Stain: NONE SEEN

## 2024-03-07 ENCOUNTER — Encounter: Payer: Self-pay | Admitting: Medical Oncology

## 2024-03-07 NOTE — Progress Notes (Signed)
 [facility]  Patient informed, per Dr. Federico, biopsy resulted with no malignancy, patient to follow-up with CT in 6 months and clinic visit. Patient gave verbal understanding and was informed workup clinic notes to be sent to her PCP, Dr. Raguel Blush. Patient denies questions at this time and was encouraged to call with questions/concerns.   Colene KYM Raider, RN, BSN Oncology Nurse Navigator, [facility] 03/07/2024 2:26 PM

## 2024-03-07 NOTE — Progress Notes (Signed)
 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  The patient's diagnostic work-up included: Lab: C-Reactive protein, Sedimentation rate, LDH, AFP tumor marker, Beta hCG quant, F T4, F T3, TSH, CMP, CBC, Imaging: NM PET Image Initial (PI) Skull Base to Thigh (01/03/2024). Referral: Cardiothoracic Surgery EBUS cytology  The patient was found to not have malignancy at this time, as evaluated for the reason for referral stated above.  The recommended follow-up provided to the patient includes:  repeat CT scan in 6 months with a return to clinic.  We thank you for allowing us  to assist in [facility] care.  The initial and most recent Progress Notes, labs, imaging, procedure(s), and/or consult notes have been routed to you through [facility] or faxed to your office for continuity of care.   Colene KYM Raider, RN, BSN Oncology Nurse Navigator, [facility] 03/07/2024 2:12 PM

## 2024-03-08 ENCOUNTER — Other Ambulatory Visit: Payer: Self-pay | Admitting: Family Medicine

## 2024-03-27 LAB — FUNGUS CULTURE WITH STAIN

## 2024-03-27 LAB — FUNGAL ORGANISM REFLEX

## 2024-03-27 LAB — FUNGUS CULTURE RESULT

## 2024-07-08 ENCOUNTER — Ambulatory Visit: Admitting: Family Medicine
